# Patient Record
Sex: Male | Born: 1975 | Race: Black or African American | Hispanic: No | Smoking: Current every day smoker
Health system: Southern US, Community
[De-identification: ages and names within clinical notes are randomized; demographics above are authoritative.]

## PROBLEM LIST (undated history)

## (undated) DIAGNOSIS — I10 Essential (primary) hypertension: Secondary | ICD-10-CM

## (undated) DIAGNOSIS — D571 Sickle-cell disease without crisis: Secondary | ICD-10-CM

## (undated) DIAGNOSIS — K219 Gastro-esophageal reflux disease without esophagitis: Secondary | ICD-10-CM

## (undated) DIAGNOSIS — D5720 Sickle-cell/Hb-C disease without crisis: Secondary | ICD-10-CM

## (undated) DIAGNOSIS — J45909 Unspecified asthma, uncomplicated: Secondary | ICD-10-CM

## (undated) HISTORY — PX: CHOLECYSTECTOMY: SHX55

---
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
## 1997-10-11 ENCOUNTER — Emergency Department (HOSPITAL_COMMUNITY): Admission: EM | Admit: 1997-10-11 | Discharge: 1997-10-11 | Payer: Self-pay | Admitting: Emergency Medicine

## 1997-11-12 ENCOUNTER — Emergency Department (HOSPITAL_COMMUNITY): Admission: EM | Admit: 1997-11-12 | Discharge: 1997-11-12 | Payer: Self-pay | Admitting: Emergency Medicine

## 1997-12-31 ENCOUNTER — Emergency Department (HOSPITAL_COMMUNITY): Admission: EM | Admit: 1997-12-31 | Discharge: 1997-12-31 | Payer: Self-pay | Admitting: Emergency Medicine

## 1998-05-30 ENCOUNTER — Encounter: Payer: Self-pay | Admitting: Family Medicine

## 1998-05-30 ENCOUNTER — Inpatient Hospital Stay (HOSPITAL_COMMUNITY): Admission: AD | Admit: 1998-05-30 | Discharge: 1998-06-03 | Payer: Self-pay | Admitting: Family Medicine

## 1999-07-31 ENCOUNTER — Encounter: Payer: Self-pay | Admitting: Emergency Medicine

## 1999-07-31 ENCOUNTER — Emergency Department (HOSPITAL_COMMUNITY): Admission: EM | Admit: 1999-07-31 | Discharge: 1999-07-31 | Payer: Self-pay | Admitting: Emergency Medicine

## 1999-08-04 ENCOUNTER — Encounter (HOSPITAL_BASED_OUTPATIENT_CLINIC_OR_DEPARTMENT_OTHER): Payer: Self-pay | Admitting: General Surgery

## 1999-08-04 ENCOUNTER — Ambulatory Visit (HOSPITAL_COMMUNITY): Admission: RE | Admit: 1999-08-04 | Discharge: 1999-08-04 | Payer: Self-pay | Admitting: General Surgery

## 1999-08-09 ENCOUNTER — Ambulatory Visit (HOSPITAL_COMMUNITY): Admission: RE | Admit: 1999-08-09 | Discharge: 1999-08-10 | Payer: Self-pay | Admitting: General Surgery

## 1999-08-09 ENCOUNTER — Encounter (INDEPENDENT_AMBULATORY_CARE_PROVIDER_SITE_OTHER): Payer: Self-pay | Admitting: Specialist

## 1999-08-09 ENCOUNTER — Encounter (HOSPITAL_BASED_OUTPATIENT_CLINIC_OR_DEPARTMENT_OTHER): Payer: Self-pay | Admitting: General Surgery

## 1999-08-12 ENCOUNTER — Inpatient Hospital Stay (HOSPITAL_COMMUNITY): Admission: AD | Admit: 1999-08-12 | Discharge: 1999-08-15 | Payer: Self-pay | Admitting: Family Medicine

## 1999-08-12 ENCOUNTER — Encounter: Payer: Self-pay | Admitting: Family Medicine

## 1999-08-15 ENCOUNTER — Encounter: Payer: Self-pay | Admitting: Family Medicine

## 1999-10-21 ENCOUNTER — Emergency Department (HOSPITAL_COMMUNITY): Admission: EM | Admit: 1999-10-21 | Discharge: 1999-10-21 | Payer: Self-pay | Admitting: Internal Medicine

## 1999-10-21 ENCOUNTER — Encounter: Payer: Self-pay | Admitting: Internal Medicine

## 2005-12-08 ENCOUNTER — Inpatient Hospital Stay (HOSPITAL_COMMUNITY): Admission: AC | Admit: 2005-12-08 | Discharge: 2005-12-09 | Payer: Self-pay

## 2006-01-01 ENCOUNTER — Emergency Department (HOSPITAL_COMMUNITY): Admission: EM | Admit: 2006-01-01 | Discharge: 2006-01-01 | Payer: Self-pay | Admitting: Emergency Medicine

## 2006-01-05 ENCOUNTER — Emergency Department (HOSPITAL_COMMUNITY): Admission: EM | Admit: 2006-01-05 | Discharge: 2006-01-06 | Payer: Self-pay | Admitting: Emergency Medicine

## 2006-02-13 DIAGNOSIS — I10 Essential (primary) hypertension: Secondary | ICD-10-CM

## 2006-02-13 HISTORY — DX: Essential (primary) hypertension: I10

## 2006-03-16 ENCOUNTER — Emergency Department (HOSPITAL_COMMUNITY): Admission: EM | Admit: 2006-03-16 | Discharge: 2006-03-16 | Payer: Self-pay | Admitting: Emergency Medicine

## 2006-08-12 ENCOUNTER — Emergency Department (HOSPITAL_COMMUNITY): Admission: EM | Admit: 2006-08-12 | Discharge: 2006-08-13 | Payer: Self-pay | Admitting: Emergency Medicine

## 2006-08-31 ENCOUNTER — Emergency Department (HOSPITAL_COMMUNITY): Admission: EM | Admit: 2006-08-31 | Discharge: 2006-08-31 | Payer: Self-pay | Admitting: Emergency Medicine

## 2007-04-16 ENCOUNTER — Emergency Department (HOSPITAL_COMMUNITY): Admission: EM | Admit: 2007-04-16 | Discharge: 2007-04-17 | Payer: Self-pay | Admitting: Emergency Medicine

## 2007-10-25 ENCOUNTER — Emergency Department (HOSPITAL_COMMUNITY): Admission: EM | Admit: 2007-10-25 | Discharge: 2007-10-25 | Payer: Self-pay | Admitting: Emergency Medicine

## 2008-11-05 ENCOUNTER — Emergency Department (HOSPITAL_COMMUNITY): Admission: EM | Admit: 2008-11-05 | Discharge: 2008-11-05 | Payer: Self-pay | Admitting: Emergency Medicine

## 2008-11-06 ENCOUNTER — Inpatient Hospital Stay (HOSPITAL_COMMUNITY): Admission: EM | Admit: 2008-11-06 | Discharge: 2008-11-09 | Payer: Self-pay | Admitting: Emergency Medicine

## 2009-08-16 ENCOUNTER — Emergency Department (HOSPITAL_COMMUNITY): Admission: EM | Admit: 2009-08-16 | Discharge: 2009-08-16 | Payer: Self-pay | Admitting: Emergency Medicine

## 2009-09-19 ENCOUNTER — Emergency Department (HOSPITAL_COMMUNITY): Admission: EM | Admit: 2009-09-19 | Discharge: 2009-09-19 | Payer: Self-pay | Admitting: Family Medicine

## 2009-11-18 ENCOUNTER — Emergency Department (HOSPITAL_COMMUNITY): Admission: EM | Admit: 2009-11-18 | Discharge: 2009-11-18 | Payer: Self-pay | Admitting: Emergency Medicine

## 2009-11-24 ENCOUNTER — Emergency Department (HOSPITAL_COMMUNITY): Admission: EM | Admit: 2009-11-24 | Discharge: 2009-11-24 | Payer: Self-pay | Admitting: Emergency Medicine

## 2009-11-29 ENCOUNTER — Emergency Department (HOSPITAL_COMMUNITY): Admission: EM | Admit: 2009-11-29 | Discharge: 2009-11-29 | Payer: Self-pay | Admitting: Emergency Medicine

## 2010-04-19 ENCOUNTER — Emergency Department (HOSPITAL_COMMUNITY)
Admission: EM | Admit: 2010-04-19 | Discharge: 2010-04-19 | Disposition: A | Discharge status: Home / Self Care | Payer: Medicaid Other | Attending: Emergency Medicine | Admitting: Emergency Medicine

## 2010-04-19 DIAGNOSIS — R51 Headache: Secondary | ICD-10-CM | POA: Insufficient documentation

## 2010-04-19 DIAGNOSIS — Z79899 Other long term (current) drug therapy: Secondary | ICD-10-CM | POA: Insufficient documentation

## 2010-04-19 DIAGNOSIS — J329 Chronic sinusitis, unspecified: Secondary | ICD-10-CM | POA: Insufficient documentation

## 2010-04-19 DIAGNOSIS — I10 Essential (primary) hypertension: Secondary | ICD-10-CM | POA: Insufficient documentation

## 2010-04-19 DIAGNOSIS — J45909 Unspecified asthma, uncomplicated: Secondary | ICD-10-CM | POA: Insufficient documentation

## 2010-04-27 LAB — URINALYSIS, ROUTINE W REFLEX MICROSCOPIC
Bilirubin Urine: NEGATIVE
Glucose, UA: NEGATIVE mg/dL
Hgb urine dipstick: NEGATIVE
Ketones, ur: NEGATIVE mg/dL
Nitrite: NEGATIVE
Protein, ur: NEGATIVE mg/dL
Specific Gravity, Urine: 1.018 (ref 1.005–1.030)
Urobilinogen, UA: 1 mg/dL (ref 0.0–1.0)
pH: 5.5 (ref 5.0–8.0)

## 2010-04-27 LAB — GC/CHLAMYDIA PROBE AMP, GENITAL
Chlamydia, DNA Probe: NEGATIVE
GC Probe Amp, Genital: NEGATIVE

## 2010-04-29 LAB — GC/CHLAMYDIA PROBE AMP, GENITAL
Chlamydia, DNA Probe: NEGATIVE
GC Probe Amp, Genital: NEGATIVE

## 2010-05-01 LAB — GC/CHLAMYDIA PROBE AMP, GENITAL: GC Probe Amp, Genital: NEGATIVE

## 2010-05-20 LAB — DIFFERENTIAL
Basophils Absolute: 0 10*3/uL (ref 0.0–0.1)
Basophils Relative: 0 % (ref 0–1)
Eosinophils Relative: 0 % (ref 0–5)
Eosinophils Relative: 1 % (ref 0–5)
Lymphocytes Relative: 4 % — ABNORMAL LOW (ref 12–46)
Lymphocytes Relative: 6 % — ABNORMAL LOW (ref 12–46)
Lymphocytes Relative: 11 % — ABNORMAL LOW (ref 12–46)
Lymphs Abs: 1.8 10*3/uL (ref 0.7–4.0)
Lymphs Abs: 1.8 10*3/uL (ref 0.7–4.0)
Monocytes Absolute: 1.4 10*3/uL — ABNORMAL HIGH (ref 0.1–1.0)
Monocytes Relative: 12 % (ref 3–12)
Monocytes Relative: 5 % (ref 3–12)
Neutrophils Relative %: 82 % — ABNORMAL HIGH (ref 43–77)
Neutrophils Relative %: 91 % — ABNORMAL HIGH (ref 43–77)

## 2010-05-20 LAB — CBC
HCT: 31.8 % — ABNORMAL LOW (ref 39.0–52.0)
HCT: 32.8 % — ABNORMAL LOW (ref 39.0–52.0)
HCT: 36.2 % — ABNORMAL LOW (ref 39.0–52.0)
HCT: 37.7 % — ABNORMAL LOW (ref 39.0–52.0)
HCT: 40.8 % (ref 39.0–52.0)
HCT: 43.4 % (ref 39.0–52.0)
Hemoglobin: 10.7 g/dL — ABNORMAL LOW (ref 13.0–17.0)
Hemoglobin: 12.4 g/dL — ABNORMAL LOW (ref 13.0–17.0)
Hemoglobin: 13.9 g/dL (ref 13.0–17.0)
Hemoglobin: 14.4 g/dL (ref 13.0–17.0)
MCHC: 33.7 g/dL (ref 30.0–36.0)
MCHC: 34.3 g/dL (ref 30.0–36.0)
MCV: 86.5 fL (ref 78.0–100.0)
Platelets: 56 10*3/uL — ABNORMAL LOW (ref 150–400)
Platelets: 64 10*3/uL — ABNORMAL LOW (ref 150–400)
Platelets: 68 10*3/uL — ABNORMAL LOW (ref 150–400)
Platelets: 70 10*3/uL — ABNORMAL LOW (ref 150–400)
Platelets: 132 10*3/uL — ABNORMAL LOW (ref 150–400)
RBC: 3.83 MIL/uL — ABNORMAL LOW (ref 4.22–5.81)
RDW: 18.8 % — ABNORMAL HIGH (ref 11.5–15.5)
RDW: 19.1 % — ABNORMAL HIGH (ref 11.5–15.5)
RDW: 19.2 % — ABNORMAL HIGH (ref 11.5–15.5)
WBC: 22.6 10*3/uL — ABNORMAL HIGH (ref 4.0–10.5)
WBC: 28.9 10*3/uL — ABNORMAL HIGH (ref 4.0–10.5)
WBC: 16.9 10*3/uL — ABNORMAL HIGH (ref 4.0–10.5)

## 2010-05-20 LAB — BASIC METABOLIC PANEL
BUN: 10 mg/dL (ref 6–23)
BUN: 9 mg/dL (ref 6–23)
Chloride: 108 mEq/L (ref 96–112)
Creatinine, Ser: 1.41 mg/dL (ref 0.4–1.5)
GFR calc non Af Amer: 58 mL/min — ABNORMAL LOW (ref >60)
GFR calc non Af Amer: 41 mL/min — ABNORMAL LOW (ref >60)
Glucose, Bld: 117 mg/dL — ABNORMAL HIGH (ref 70–99)
Potassium: 4 mEq/L (ref 3.5–5.1)
Potassium: 4.9 mEq/L (ref 3.5–5.1)
Sodium: 136 mEq/L (ref 135–145)

## 2010-05-20 LAB — LIPID PANEL
Cholesterol: 84 mg/dL (ref 0–200)
LDL Cholesterol: 44 mg/dL (ref 0–99)
Total CHOL/HDL Ratio: 2.7 RATIO
Triglycerides: 44 mg/dL (ref <150)
VLDL: 9 mg/dL (ref 0–40)

## 2010-05-20 LAB — COMPREHENSIVE METABOLIC PANEL
ALT: 370 U/L — ABNORMAL HIGH (ref 0–53)
AST: 169 U/L — ABNORMAL HIGH (ref 0–37)
Albumin: 3.3 g/dL — ABNORMAL LOW (ref 3.5–5.2)
Albumin: 4.9 g/dL (ref 3.5–5.2)
BUN: 11 mg/dL (ref 6–23)
BUN: 8 mg/dL (ref 6–23)
CO2: 26 mEq/L (ref 19–32)
CO2: 27 mEq/L (ref 19–32)
Calcium: 8.9 mg/dL (ref 8.4–10.5)
Calcium: 9 mg/dL (ref 8.4–10.5)
Calcium: 9.3 mg/dL (ref 8.4–10.5)
Calcium: 9.6 mg/dL (ref 8.4–10.5)
Chloride: 95 mEq/L — ABNORMAL LOW (ref 96–112)
Creatinine, Ser: 1.22 mg/dL (ref 0.4–1.5)
Creatinine, Ser: 1.39 mg/dL (ref 0.4–1.5)
Creatinine, Ser: 1.39 mg/dL (ref 0.4–1.5)
Creatinine, Ser: 1.62 mg/dL — ABNORMAL HIGH (ref 0.4–1.5)
GFR calc Af Amer: >60 mL/min (ref >60)
GFR calc non Af Amer: 59 mL/min — ABNORMAL LOW (ref >60)
GFR calc non Af Amer: >60 mL/min (ref >60)
Glucose, Bld: 123 mg/dL — ABNORMAL HIGH (ref 70–99)
Glucose, Bld: 135 mg/dL — ABNORMAL HIGH (ref 70–99)
Glucose, Bld: 89 mg/dL (ref 70–99)
Potassium: 4.2 mEq/L (ref 3.5–5.1)
Total Bilirubin: 3.9 mg/dL — ABNORMAL HIGH (ref 0.3–1.2)
Total Protein: 8.1 g/dL (ref 6.0–8.3)

## 2010-05-20 LAB — URINALYSIS, ROUTINE W REFLEX MICROSCOPIC
Bilirubin Urine: NEGATIVE
Glucose, UA: NEGATIVE mg/dL
Ketones, ur: NEGATIVE mg/dL
Nitrite: NEGATIVE
Protein, ur: 30 mg/dL — AB
Protein, ur: 100 mg/dL — AB
Urobilinogen, UA: 1 mg/dL (ref 0.0–1.0)
Urobilinogen, UA: 1 mg/dL (ref 0.0–1.0)

## 2010-05-20 LAB — CULTURE, BLOOD (ROUTINE X 2): Culture: NO GROWTH

## 2010-05-20 LAB — RAPID URINE DRUG SCREEN, HOSP PERFORMED
Amphetamines: NOT DETECTED
Barbiturates: NOT DETECTED
Benzodiazepines: NOT DETECTED
Benzodiazepines: NOT DETECTED
Cocaine: NOT DETECTED

## 2010-05-20 LAB — RETICULOCYTES
RBC.: 4.76 MIL/uL (ref 4.22–5.81)
Retic Count, Absolute: 176.1 10*3/uL (ref 19.0–186.0)
Retic Count, Absolute: 207.9 10*3/uL — ABNORMAL HIGH (ref 19.0–186.0)
Retic Ct Pct: 3.7 % — ABNORMAL HIGH (ref 0.4–3.1)
Retic Ct Pct: 4.2 % — ABNORMAL HIGH (ref 0.4–3.1)

## 2010-05-20 LAB — ETHANOL: Alcohol, Ethyl (B): <5 mg/dL (ref 0–10)

## 2010-05-20 LAB — URINE MICROSCOPIC-ADD ON

## 2010-05-20 LAB — CK TOTAL AND CKMB (NOT AT ARMC): Relative Index: 0 (ref 0.0–2.5)

## 2010-05-20 LAB — HEPATITIS PANEL, ACUTE: Hep B C IgM: NEGATIVE

## 2010-05-20 LAB — URINE CULTURE: Culture: NO GROWTH

## 2010-07-01 NOTE — Op Note (Signed)
[place]. [facility]  Patient:    CASSADY, TURANO                     MRN: [id number] Proc. Date: 08/09/99 Adm. Date:  [id number] Disc. Date: [id number] Attending:  Sonda Primes CC:         Mardene Celeste. Lurene Shadow, M.D. x 2                           Operative Report  PREOPERATIVE DIAGNOSIS:  Chronic calculus cholecystitis.  POSTOPERATIVE DIAGNOSIS:  Chronic calculus cholecystitis.  PROCEDURE:  Laparoscopic cholecystectomy and intraoperative cholangiogram.  SURGEON:  Luisa Hart L. Lurene Shadow, M.D.  ASSISTANT:  Marnee Spring. Wiliam Ke, M.D.  ANESTHESIA:  General.  INDICATIONS:  The patient is a 35 year old man with sickle cell anemia, presenting with upper abdominal pain, nausea and vomiting.  CT scan and gallbladder ultrasound confirmed cholelithiasis.  The patient also has a very large spleen and liver.  He is brought to the operating room now for a laparoscopic cholecystectomy.  Usual precautions for sickle cell patients were taken.  DESCRIPTION OF PROCEDURE:  Following the induction of anesthesia, the patient was positioned supinely, the abdomen prepped and draped to be included in the sterile operative field.  Open laparoscopy of the umbilicus created and Hassan cannula inserted and insufflation of the peritoneal cavity to 40 mmHg pressure with carbon dioxide was carried out.  Following exploration of the abdomen, the liver looks to be large, spleen enlarged and also somewhat distended, but decompressed with the nasogastric tube, and the large and small intestine appeared to be normal.  Under direct vision, epigastric and lateral ports were placed.  The gallbladder was grasped and retracted cephalad.  Dissection was carried down to the region of the hepatoduodenal ligament and was isolated from the cystic artery and the cystic duct.  The cystic artery ran anterior to the cystic duct.  The cystic artery was doubly clipped and transected.  The cystic duct was  dissected free and opened.  There was a rather large cystic duct and upon opening, multiple small stones could be extravasated.  I attempted a cystic duct cholangiogram at this point.  However, it was very difficult to get the cholangiocatheter to stay within the duct and because of the very large valve within the cystic duct.  However, a cholangiogram was obtained using a Reddick catheter placed within the duct and resulting cholangiogram showed free flow of contrast into the common bile duct and down to the duodenum.  We could not see the common hepatic duct or upper hepatic radicles because of extravasation of the contrast.  Several more attempts were made to get a good cholangiogram, but this was not possible.  We then dissected down on the cystic duct so as to positively identify the common hepatic duct and it was noted to be uninjured.  The cystic duct was then doubly clipped and transected, and the gallbladder dissected free from the liver bed using electrocautery.  At the end of the dissection, the liver bed was checked for hemostasis and noted to be dry.  Sponge, instrument, and sharp counts were then verified.  The camera removed through the epigastric port and the gallbladder retrieved through the umbilical port without difficulty.  The specimen was also thoroughly inspected and no additional _______ tissue was noted.  Pneumoperitoneum was allowed to deflate.  Sponge, instrument, and sharp counts again verified, and the wound was closed in  layers as follows; the umbilical wound in two layers with 0 Dexon and 4-0 Dexon, the epigastric wound with 4-0 Dexon and lateral flank wounds with 4-0 Dexon.  All wounds reinforced with Steri-Strips.  A sterile dressing was applied. Anesthetic reversed and the patient removed from the operating room to the recovery room in stable condition, having tolerated the procedure well. DD:  08/09/99 TD:  08/10/99 Job: [id number]

## 2010-07-01 NOTE — Discharge Summary (Signed)
[place]. [facility]  Patient:    Dean Webb, Dean Webb                     MRN: [id number] Adm. Date:  [id number] Disc. Date: [id number] Attending:  Judie Petit                           Discharge Summary  NO DICTATION. DD:  09/22/99 TD:  09/22/99 Job: [id number]

## 2010-07-01 NOTE — Discharge Summary (Signed)
[place]. [facility]  Patient:    Dean Webb, Dean Webb                     MRN: [id number] Adm. Date:  [id number] Disc. Date: [id number] Attending:  Judie Petit                           Discharge Summary  NO DICTATION DD:  09/22/99 TD:  09/23/99 Job: [phone] [id number]

## 2010-07-01 NOTE — Discharge Summary (Signed)
[place]. [facility]  Patient:    Dean Webb, Dean Webb                     MRN: [id number] Adm. Date:  [id number] Disc. Date: [id number] Attending:  Judie Webb                           Discharge Summary  ADMITTING DIAGNOSES: 1. Acute chest syndrome. 2. ______ sickle cell disease. 3. Status post recent laparoscopic cholecystectomy.  DISCHARGE DIAGNOSES: 1. Right lower lobe pneumonia. 2. ______  sickle cell vaso-occlusive crisis. 3. Status post recent laparoscopic cholecystectomy.  DISCHARGE CONDITION:  Stable.  DISCHARGE DISPOSITION:  Follow up in approximately one week.  DISCHARGE MEDICATIONS:  Levaquin 500 mg daily for five more days.  Will repeat a chest x-ray in approximately one week.  Patient will resume home medicine of Vicuprofen one q.i.d. p.r.n. pain and folic acid 1 mg daily.  HISTORY OF PRESENT ILLNESS:  This patient is a 35 year old single black male who presented with weakness and chest pain onset on June 27,2001, after a laparoscopic cholecystectomy on August 09, 1999.  He was seen in the emergency room on July 31, 1999, and found to have gallbladder disease.  He was subsequently referred to Dr. Leonie Webb and laparoscopic cholecystectomy was done.  Patient thusly has [name] sickle cell disease and has been symptomatic at intervals requiring opioid analgesics.  He presented to my office on day of admission with complaint of chest pain substernally and coughing up yellow mucus of two nights.  He had been unable to sleep despite being on apparently Keflex 250 mg b.i.d. and opioid analgesics since surgery.  PAST MEDICAL HISTORY:  Patient has had several admissions to [facility] for mostly vaso-occlusive pain crisis.  PAST SURGICAL HISTORY:  Only operation has been laparoscopic cholecystectomy on August 04, 1999.  ALLERGIES:  He has no known allergies.  MEDICATIONS:  Vicuprofen.  FAMILY HISTORY:  Patients mother is age  24 and disabled due to heart and asthma problems.  Father is age 69 and has asthma.  He has three siblings who are alive and well.  He has two children, ages 2 and 76, who are alive and well.  REVIEW OF SYSTEMS:  Unremarkable.  PERSONAL HISTORY:  Patient does not drink alcohol, do drugs, or smoke.  He has no pica.  SOCIAL HISTORY:  Reveals patient lives at home with his mother.  He stopped school in the 10th grade.  His occupation is currently disability secondary to sickle cell disease which he obtained approximately two months ago.  PHYSICAL EXAMINATION:  VITAL SIGNS:  Blood pressure 132/78, pulse 134, respirations 20, and temperature 103.3 upon arrival in hospital.  GENERAL:  Patient appeared moderately uncomfortable, experiencing some coughing paroxysms.  He was alert and lying supine in bed  HEENT:  Revealed PERRLA, EOMS were intact.  There was no significant scleral icterus though eyes were mildly slightly brownish.  Mouth exam revealed normal mucosa with slight erythematous peritonsillar regions.  NECK:  Supple without jugular venous distention, bruits, nodes, or thyromegaly. CHEST:  Clear to auscultation.  HEART:  Regular rate without murmur.  ABDOMEN:  Soft and flat.  Bowel sounds were normal.  GENITALIA:  Exam revealed bilateral distended testicles and circumcised penis.  RECTAL:  Examination was deferred.  EXTREMITIES:  Revealed normal range of motion without clubbing, cyanosis, edema, or deformity.  SKIN:  Exam revealed no lesions.  He did have slight hyperthermia to touch.  NEUROLOGIC:  Exam revealed him to be mostly weak.  LABORATORY DATA:  Chest x-ray revealed right lower lung infiltrate and left lung atelectasis.  Blood cultures x 2 peripherally had no growth.  Urinalysis revealed amber color with moderate bilirubin at 15 mg percent ketones.  There was greater than 8 mg percent of urobilinogen, trace leukocyte esterase, and ______ test positive.   There were many bacteria and few epithelial cells.  Urine culture sample apparently was not sent per labs.  CBC revealed white count of 16,200, hemoglobin 8.5, hematocrit 23.7, platelets 185,000.  Repeat revealed white count of 14.4 with a hemoglobin of 8.2.  Differential revealed 77% neutrophils, 13% lymphocytes, 7% monocytes, 1 eosinophil.  Red blood cell morphology revealed target cells, polychromasia.  Reticulocyte count was 9.7 with absolute reticulocyte 323.  Normal range is 19 to 186.  Initial chemistries reveal sodium 133, potassium 3.1, chloride 99, glucose 121, albumin 3.4, ALT 58, ALP 97, and total bilirubin 2.8.  Sputums revealed no acid fact bacilli seen, no yeast or ______ elements seen.  Repeat CBC the day of discharge revealed white count of 10.3, hemoglobin 9.5 after two units of packed red blood cells and platelet count of 208.  Chemistries were normal except for bilirubin 3.6, alkaline phosphatase 123, SGOT 50, and SGPT of 66.  Initial chest x-ray revealed right lower lobe infiltrate.  Repeat chest x-ray revealed clearing of right lower lobe infiltrate.  Patients blood type was O positive with negative antibody screen.  HOSPITAL COURSE:  The patient was admitted to the hospital telemetry unit on 3700 and given IV fluids of D-5 one-half normal saline at 150 cc per hour.  He was given Levaquin IV 5 mg q.24h. and albuterol 2.5 nebulizer q.4h.  He was also provided incentive spirometer to use q.1h. while awake.  Cultures were done and patient was given ibuprofen 800 mg q.8h.  He was given Dilaudid 2 mg IV q.2h. and OxyContin 20 mg q.12h.  He was given Prevacid 30 mg daily and gradually improved.  On-call physician provided patient with two units of packed red blood cells and potassium supplement on June 29.  He is at this time asymptomatic and not requiring other pain medicines.  He feels good and looks great.  He is ambulatory and has a terrific appetite.  He is  thus discharged for outpatient management. DD:  08/15/99 TD:  08/15/99 Job: [id number]

## 2010-08-04 ENCOUNTER — Emergency Department (HOSPITAL_COMMUNITY)
Admission: EM | Admit: 2010-08-04 | Discharge: 2010-08-04 | Disposition: A | Discharge status: Home / Self Care | Payer: Medicaid Other | Attending: Emergency Medicine | Admitting: Emergency Medicine

## 2010-08-04 ENCOUNTER — Emergency Department (HOSPITAL_COMMUNITY): Payer: Medicaid Other

## 2010-08-04 DIAGNOSIS — R109 Unspecified abdominal pain: Secondary | ICD-10-CM | POA: Insufficient documentation

## 2010-08-04 DIAGNOSIS — R000 Tachycardia, unspecified: Secondary | ICD-10-CM | POA: Insufficient documentation

## 2010-08-04 DIAGNOSIS — D571 Sickle-cell disease without crisis: Secondary | ICD-10-CM | POA: Insufficient documentation

## 2010-08-04 DIAGNOSIS — M545 Low back pain, unspecified: Secondary | ICD-10-CM | POA: Insufficient documentation

## 2010-08-04 DIAGNOSIS — R509 Fever, unspecified: Secondary | ICD-10-CM | POA: Insufficient documentation

## 2010-08-04 DIAGNOSIS — R0609 Other forms of dyspnea: Secondary | ICD-10-CM | POA: Insufficient documentation

## 2010-08-04 DIAGNOSIS — J069 Acute upper respiratory infection, unspecified: Secondary | ICD-10-CM | POA: Insufficient documentation

## 2010-08-04 DIAGNOSIS — M533 Sacrococcygeal disorders, not elsewhere classified: Secondary | ICD-10-CM | POA: Insufficient documentation

## 2010-08-04 DIAGNOSIS — IMO0001 Reserved for inherently not codable concepts without codable children: Secondary | ICD-10-CM | POA: Insufficient documentation

## 2010-08-04 DIAGNOSIS — R300 Dysuria: Secondary | ICD-10-CM | POA: Insufficient documentation

## 2010-08-04 DIAGNOSIS — R0989 Other specified symptoms and signs involving the circulatory and respiratory systems: Secondary | ICD-10-CM | POA: Insufficient documentation

## 2010-08-04 DIAGNOSIS — I10 Essential (primary) hypertension: Secondary | ICD-10-CM | POA: Insufficient documentation

## 2010-08-04 DIAGNOSIS — R059 Cough, unspecified: Secondary | ICD-10-CM | POA: Insufficient documentation

## 2010-08-04 DIAGNOSIS — R6889 Other general symptoms and signs: Secondary | ICD-10-CM | POA: Insufficient documentation

## 2010-08-04 DIAGNOSIS — R112 Nausea with vomiting, unspecified: Secondary | ICD-10-CM | POA: Insufficient documentation

## 2010-08-04 DIAGNOSIS — R05 Cough: Secondary | ICD-10-CM | POA: Insufficient documentation

## 2010-08-04 DIAGNOSIS — M79609 Pain in unspecified limb: Secondary | ICD-10-CM | POA: Insufficient documentation

## 2010-08-04 DIAGNOSIS — J45909 Unspecified asthma, uncomplicated: Secondary | ICD-10-CM | POA: Insufficient documentation

## 2010-08-04 DIAGNOSIS — R599 Enlarged lymph nodes, unspecified: Secondary | ICD-10-CM | POA: Insufficient documentation

## 2010-08-04 DIAGNOSIS — R070 Pain in throat: Secondary | ICD-10-CM | POA: Insufficient documentation

## 2010-08-04 DIAGNOSIS — R5381 Other malaise: Secondary | ICD-10-CM | POA: Insufficient documentation

## 2010-08-04 LAB — COMPREHENSIVE METABOLIC PANEL
ALT: 86 U/L — ABNORMAL HIGH (ref 0–53)
AST: 55 U/L — ABNORMAL HIGH (ref 0–37)
Albumin: 4.6 g/dL (ref 3.5–5.2)
CO2: 26 mEq/L (ref 19–32)
Calcium: 10.5 mg/dL (ref 8.4–10.5)
Sodium: 134 mEq/L — ABNORMAL LOW (ref 135–145)
Total Protein: 7.7 g/dL (ref 6.0–8.3)

## 2010-08-04 LAB — RETICULOCYTES
RBC.: 4.62 MIL/uL (ref 4.22–5.81)
Retic Count, Absolute: 175.6 10*3/uL (ref 19.0–186.0)
Retic Ct Pct: 3.8 % — ABNORMAL HIGH (ref 0.4–3.1)

## 2010-08-04 LAB — CBC
MCV: 75.5 fL — ABNORMAL LOW (ref 78.0–100.0)
Platelets: 173 10*3/uL (ref 150–400)
RBC: 4.62 MIL/uL (ref 4.22–5.81)
WBC: 9.3 10*3/uL (ref 4.0–10.5)

## 2010-08-04 LAB — DIFFERENTIAL
Basophils Relative: 1 % (ref 0–1)
Eosinophils Absolute: 0 10*3/uL (ref 0.0–0.7)
Lymphocytes Relative: 16 % (ref 12–46)
Neutrophils Relative %: 61 % (ref 43–77)

## 2010-08-04 LAB — RAPID URINE DRUG SCREEN, HOSP PERFORMED
Opiates: NOT DETECTED
Tetrahydrocannabinol: NOT DETECTED

## 2010-08-04 LAB — URINALYSIS, ROUTINE W REFLEX MICROSCOPIC
Leukocytes, UA: NEGATIVE
Protein, ur: NEGATIVE mg/dL
Urobilinogen, UA: 2 mg/dL — ABNORMAL HIGH (ref 0.0–1.0)

## 2010-11-07 LAB — CBC
HCT: 37.6 — ABNORMAL LOW
Hemoglobin: 12.6 — ABNORMAL LOW
MCHC: 33.5
MCV: 84.4
RBC: 4.45
WBC: 6.7

## 2010-11-07 LAB — I-STAT 8, (EC8 V) (CONVERTED LAB)
Acid-Base Excess: 3 — ABNORMAL HIGH
Chloride: 104
Glucose, Bld: 94
Hemoglobin: 13.9
Potassium: 3.6
Sodium: 139
TCO2: 29
pH, Ven: 7.407 — ABNORMAL HIGH

## 2010-11-07 LAB — DIFFERENTIAL
Basophils Relative: 1
Eosinophils Absolute: 0.2
Lymphs Abs: 2
Monocytes Absolute: 0.6
Monocytes Relative: 9

## 2010-11-07 LAB — POCT I-STAT CREATININE: Operator id: 277751

## 2010-11-16 LAB — DIFFERENTIAL
Basophils Absolute: 0
Eosinophils Relative: 4
Lymphocytes Relative: 37
Lymphs Abs: 2.2
Monocytes Absolute: 0.5
Monocytes Relative: 9
Neutro Abs: 2.9

## 2010-11-16 LAB — COMPREHENSIVE METABOLIC PANEL
AST: 70 — ABNORMAL HIGH
Albumin: 4.3
BUN: 8
Calcium: 9.8
Chloride: 104
Creatinine, Ser: 1.21
GFR calc Af Amer: >60
Total Protein: 7.1

## 2010-11-16 LAB — AMYLASE: Amylase: 108

## 2010-11-16 LAB — CBC
HCT: 41.6
MCV: 86.3
Platelets: 141 — ABNORMAL LOW
RDW: 18.4 — ABNORMAL HIGH
WBC: 5.9

## 2010-11-16 LAB — URINALYSIS, ROUTINE W REFLEX MICROSCOPIC
Bilirubin Urine: NEGATIVE
Glucose, UA: NEGATIVE
Hgb urine dipstick: NEGATIVE
Specific Gravity, Urine: 1.015
Urobilinogen, UA: 1
pH: 5.5

## 2010-11-28 LAB — I-STAT 8, (EC8 V) (CONVERTED LAB)
Acid-Base Excess: 2
Bicarbonate: 26.8 — ABNORMAL HIGH
Glucose, Bld: 94
Sodium: 140
TCO2: 28
pH, Ven: 7.431 — ABNORMAL HIGH

## 2010-11-28 LAB — DIFFERENTIAL
Eosinophils Absolute: 0.2
Lymphs Abs: 2.1
Monocytes Absolute: 0.6
Monocytes Relative: 10
Neutrophils Relative %: 48

## 2010-11-28 LAB — POCT I-STAT CREATININE
Creatinine, Ser: 1.2
Operator id: 189501

## 2010-11-28 LAB — CBC
HCT: 44.2
Hemoglobin: 14.8
MCHC: 33.4
MCV: 84.9
Platelets: 175
RBC: 5.2
RDW: 18 — ABNORMAL HIGH
WBC: 5.6

## 2011-06-04 ENCOUNTER — Emergency Department (HOSPITAL_COMMUNITY)
Admission: EM | Admit: 2011-06-04 | Discharge: 2011-06-04 | Disposition: A | Discharge status: Home / Self Care | Payer: Medicaid Other | Attending: Emergency Medicine | Admitting: Emergency Medicine

## 2011-06-04 ENCOUNTER — Encounter (HOSPITAL_COMMUNITY): Payer: Self-pay | Admitting: Emergency Medicine

## 2011-06-04 DIAGNOSIS — R112 Nausea with vomiting, unspecified: Secondary | ICD-10-CM | POA: Insufficient documentation

## 2011-06-04 DIAGNOSIS — R1012 Left upper quadrant pain: Secondary | ICD-10-CM | POA: Insufficient documentation

## 2011-06-04 DIAGNOSIS — K5289 Other specified noninfective gastroenteritis and colitis: Secondary | ICD-10-CM | POA: Insufficient documentation

## 2011-06-04 DIAGNOSIS — R197 Diarrhea, unspecified: Secondary | ICD-10-CM | POA: Insufficient documentation

## 2011-06-04 DIAGNOSIS — IMO0001 Reserved for inherently not codable concepts without codable children: Secondary | ICD-10-CM | POA: Insufficient documentation

## 2011-06-04 DIAGNOSIS — R059 Cough, unspecified: Secondary | ICD-10-CM | POA: Insufficient documentation

## 2011-06-04 DIAGNOSIS — R05 Cough: Secondary | ICD-10-CM | POA: Insufficient documentation

## 2011-06-04 DIAGNOSIS — D571 Sickle-cell disease without crisis: Secondary | ICD-10-CM | POA: Insufficient documentation

## 2011-06-04 DIAGNOSIS — I10 Essential (primary) hypertension: Secondary | ICD-10-CM | POA: Insufficient documentation

## 2011-06-04 DIAGNOSIS — K529 Noninfective gastroenteritis and colitis, unspecified: Secondary | ICD-10-CM

## 2011-06-04 HISTORY — DX: Essential (primary) hypertension: I10

## 2011-06-04 HISTORY — DX: Sickle-cell disease without crisis: D57.1

## 2011-06-04 LAB — COMPREHENSIVE METABOLIC PANEL
ALT: 78 U/L — ABNORMAL HIGH (ref 0–53)
BUN: 20 mg/dL (ref 6–23)
CO2: 25 mEq/L (ref 19–32)
Calcium: 9.9 mg/dL (ref 8.4–10.5)
Creatinine, Ser: 1.43 mg/dL — ABNORMAL HIGH (ref 0.50–1.35)
GFR calc Af Amer: 72 mL/min — ABNORMAL LOW (ref >90)
GFR calc non Af Amer: 62 mL/min — ABNORMAL LOW (ref >90)
Glucose, Bld: 113 mg/dL — ABNORMAL HIGH (ref 70–99)

## 2011-06-04 LAB — CBC
HCT: 37.4 % — ABNORMAL LOW (ref 39.0–52.0)
Hemoglobin: 14.1 g/dL (ref 13.0–17.0)
MCH: 28.1 pg (ref 26.0–34.0)
MCHC: 37.7 g/dL — ABNORMAL HIGH (ref 30.0–36.0)

## 2011-06-04 LAB — DIFFERENTIAL
Basophils Relative: 0 % (ref 0–1)
Eosinophils Absolute: 0.1 10*3/uL (ref 0.0–0.7)
Lymphs Abs: 3.1 10*3/uL (ref 0.7–4.0)
Monocytes Absolute: 2.5 10*3/uL — ABNORMAL HIGH (ref 0.1–1.0)
Neutro Abs: 9 10*3/uL — ABNORMAL HIGH (ref 1.7–7.7)

## 2011-06-04 LAB — URINALYSIS, ROUTINE W REFLEX MICROSCOPIC
Hgb urine dipstick: NEGATIVE
Protein, ur: NEGATIVE mg/dL
Urobilinogen, UA: 1 mg/dL (ref 0.0–1.0)

## 2011-06-04 MED ORDER — ONDANSETRON 8 MG PO TBDP: 8.0000 mg | ORAL_TABLET | Freq: Three times a day (TID) | ORAL | Status: AC | PRN

## 2011-06-04 MED ORDER — FENTANYL CITRATE 0.05 MG/ML IJ SOLN
50.0000 ug | Freq: Once | INTRAMUSCULAR | Status: AC
  Administered 2011-06-04: 50 ug via INTRAVENOUS
  Filled 2011-06-04: qty 2

## 2011-06-04 MED ORDER — TRAMADOL HCL 50 MG PO TABS: 50.0000 mg | ORAL_TABLET | Freq: Four times a day (QID) | ORAL | Status: AC | PRN

## 2011-06-04 MED ORDER — ONDANSETRON HCL 4 MG/2ML IJ SOLN
4.0000 mg | Freq: Once | INTRAMUSCULAR | Status: AC
  Administered 2011-06-04: 4 mg via INTRAVENOUS
  Filled 2011-06-04: qty 2

## 2011-06-04 MED ORDER — SODIUM CHLORIDE 0.9 % IV BOLUS (SEPSIS)
1000.0000 mL | Freq: Once | INTRAVENOUS | Status: AC
  Administered 2011-06-04: 1000 mL via INTRAVENOUS

## 2011-06-04 NOTE — ED Provider Notes (Signed)
History     CSN: [id number]  Arrival date & time 06/04/11  0612   First MD Initiated Contact with Patient 06/04/11 0622      6:31 AM Patient reports emesis that began 3 days ago. States initially also had associated diarrhea which has since resolved. However states she continues to have nausea and vomiting, myalgias and coughing. Reports diffuse abdominal pain which seems to be concentrated in the left upper quadrant. Denies urinary symptoms, hematuria, hematochezia, fever, chest pain, shortness of breath patient is a significant history of sickle cell disease, and a cholecystectomy. Reports positive sick contact. States his daughter had similar symptoms that started 5 days ago.  Patient is a 36 y.o. male presenting with vomiting. The history is provided by the patient.  Emesis  This is a new problem. The current episode started more than 2 days ago. The problem has been gradually worsening. The emesis has an appearance of stomach contents. There has been no fever. Associated symptoms include abdominal pain and diarrhea. Pertinent negatives include no chills, no cough, no fever, no headaches, no myalgias and no URI. Risk factors include ill contacts.    Past Medical History  Diagnosis Date  . Sickle cell disease   . Hypertension     Past Surgical History  Procedure Date  . Cholecystectomy     History reviewed. No pertinent family history.  History  Substance Use Topics  . Smoking status: Current Some Day Smoker  . Smokeless tobacco: Not on file  . Alcohol Use: No      Review of Systems  Constitutional: Negative for fever and chills.  Respiratory: Negative for cough and shortness of breath.   Cardiovascular: Negative for chest pain.  Gastrointestinal: Positive for nausea, vomiting, abdominal pain and diarrhea. Negative for constipation, blood in stool and rectal pain.  Genitourinary: Negative for dysuria, hematuria, flank pain, discharge, penile pain and testicular pain.    Musculoskeletal: Negative for myalgias and back pain.  Neurological: Negative for dizziness, weakness, numbness and headaches.  All other systems reviewed and are negative.    Allergies  Review of patient's allergies indicates no known allergies.  Home Medications   Current Outpatient Rx  Name Route Sig Dispense Refill  . ALBUTEROL SULFATE HFA 108 (90 BASE) MCG/ACT IN AERS Inhalation Inhale 2 puffs into the lungs every 6 (six) hours as needed. wheezing    . HYDROCHLOROTHIAZIDE 25 MG PO TABS Oral Take 25 mg by mouth daily.      BP 151/108  Pulse 111  Temp(Src) 97.6 F (36.4 C) (Oral)  Resp 20  SpO2 97%  Physical Exam  Constitutional: He is oriented to person, place, and time. He appears well-developed and well-nourished.  HENT:  Head: Normocephalic and atraumatic.  Eyes: Conjunctivae are normal. Pupils are equal, round, and reactive to light.  Neck: Normal range of motion. Neck supple.  Cardiovascular: Normal rate, regular rhythm and normal heart sounds.  Exam reveals no gallop and no friction rub.   No murmur heard.      Hypertensive  Pulmonary/Chest: Effort normal and breath sounds normal. He has no wheezes. He has no rales. He exhibits no tenderness.  Abdominal: Soft. Bowel sounds are normal. He exhibits no distension and no mass. There is tenderness (diffuse). There is no rebound and no guarding.  Neurological: He is alert and oriented to person, place, and time.  Skin: Skin is warm and dry. No rash noted. No erythema. No pallor.  Psychiatric: He has a normal mood and affect.  His behavior is normal.    ED Course  Procedures   Results for orders placed during the hospital encounter of 06/04/11  CBC      Component Value Range   WBC 14.7 (*) 4.0 - 10.5 (K/uL)   RBC 5.02  4.22 - 5.81 (MIL/uL)   Hemoglobin 14.1  13.0 - 17.0 (g/dL)   HCT 16.1 (*) 09.6 - 52.0 (%)   MCV 74.5 (*) 78.0 - 100.0 (fL)   MCH 28.1  26.0 - 34.0 (pg)   MCHC 37.7 (*) 30.0 - 36.0 (g/dL)   RDW  04.5 (*) 40.9 - 15.5 (%)   Platelets 234  150 - 400 (K/uL)  DIFFERENTIAL      Component Value Range   Neutrophils Relative 61  43 - 77 (%)   Lymphocytes Relative 21  12 - 46 (%)   Monocytes Relative 17 (*) 3 - 12 (%)   Eosinophils Relative 1  0 - 5 (%)   Basophils Relative 0  0 - 1 (%)   Neutro Abs 9.0 (*) 1.7 - 7.7 (K/uL)   Lymphs Abs 3.1  0.7 - 4.0 (K/uL)   Monocytes Absolute 2.5 (*) 0.1 - 1.0 (K/uL)   Eosinophils Absolute 0.1  0.0 - 0.7 (K/uL)   Basophils Absolute 0.0  0.0 - 0.1 (K/uL)   RBC Morphology POLYCHROMASIA PRESENT     WBC Morphology ATYPICAL LYMPHOCYTES    COMPREHENSIVE METABOLIC PANEL      Component Value Range   Sodium 137  135 - 145 (mEq/L)   Potassium 3.4 (*) 3.5 - 5.1 (mEq/L)   Chloride 101  96 - 112 (mEq/L)   CO2 25  19 - 32 (mEq/L)   Glucose, Bld 113 (*) 70 - 99 (mg/dL)   BUN 20  6 - 23 (mg/dL)   Creatinine, Ser 8.11 (*) 0.50 - 1.35 (mg/dL)   Calcium 9.9  8.4 - 91.4 (mg/dL)   Total Protein 7.3  6.0 - 8.3 (g/dL)   Albumin 4.2  3.5 - 5.2 (g/dL)   AST 79 (*) 0 - 37 (U/L)   ALT 78 (*) 0 - 53 (U/L)   Alkaline Phosphatase 83  39 - 117 (U/L)   Total Bilirubin 2.3 (*) 0.3 - 1.2 (mg/dL)   GFR calc non Af Amer 62 (*) >90 (mL/min)   GFR calc Af Amer 72 (*) >90 (mL/min)  LIPASE, BLOOD      Component Value Range   Lipase 14  11 - 59 (U/L)  URINALYSIS, ROUTINE W REFLEX MICROSCOPIC      Component Value Range   Color, Urine AMBER (*) YELLOW    APPearance CLEAR  CLEAR    Specific Gravity, Urine 1.019  1.005 - 1.030    pH 6.0  5.0 - 8.0    Glucose, UA NEGATIVE  NEGATIVE (mg/dL)   Hgb urine dipstick NEGATIVE  NEGATIVE    Bilirubin Urine SMALL (*) NEGATIVE    Ketones, ur 15 (*) NEGATIVE (mg/dL)   Protein, ur NEGATIVE  NEGATIVE (mg/dL)   Urobilinogen, UA 1.0  0.0 - 1.0 (mg/dL)   Nitrite NEGATIVE  NEGATIVE    Leukocytes, UA NEGATIVE  NEGATIVE    No results found.   MDM   8:57 AM Patient reports significant improvement of pain. Is sleeping heavily in room when I  entered. Upon reexamination patient is nontender abdomen. Labs benign. With positive sick contact patient likely has a gastroenteritis. Will give medication for pain and nausea. Advised patient that he did have some lab abnormalities  but they do not seem to significantly change from prior labs up to 2010. LFTs, creatinine, white blood cell count elevated but appear baseline. Patient agrees with plan for discharge. Advised followup with primary care physician if symptoms continue.       Thomasene Lot, PA-C 06/04/11 0859  Thomasene Lot, PA-C 06/04/11 [phone]

## 2011-06-04 NOTE — ED Notes (Signed)
Pt knows that urine is needed. Pt is trying to void at this time. 

## 2011-06-04 NOTE — ED Notes (Signed)
Tolerated po challenge

## 2011-06-04 NOTE — Discharge Instructions (Signed)
B.R.A.T. Diet Your doctor has recommended the B.R.A.T. diet for you or your child until the condition improves. This is often used to help control diarrhea and vomiting symptoms. If you or your child can tolerate clear liquids, you may have:  Bananas.   Rice.   Applesauce.   Toast (and other simple starches such as crackers, potatoes, noodles).  Be sure to avoid dairy products, meats, and fatty foods until symptoms are better. Fruit juices such as apple, grape, and prune juice can make diarrhea worse. Avoid these. Continue this diet for 2 days or as instructed by your caregiver. Document Released: 01/30/2005 Document Revised: 01/19/2011 Document Reviewed: 07/19/2006 Exit[employer] Patient Information 2012 [employer].Viral Gastroenteritis Viral gastroenteritis is also known as stomach flu. This condition affects the stomach and intestinal tract. It can cause sudden diarrhea and vomiting. The illness typically lasts 3 to 8 days. Most people develop an immune response that eventually gets rid of the virus. While this natural response develops, the virus can make you quite ill. CAUSES  Many different viruses can cause gastroenteritis, such as rotavirus or noroviruses. You can catch one of these viruses by consuming contaminated food or water. You may also catch a virus by sharing utensils or other personal items with an infected person or by touching a contaminated surface. SYMPTOMS  The most common symptoms are diarrhea and vomiting. These problems can cause a severe loss of body fluids (dehydration) and a body salt (electrolyte) imbalance. Other symptoms may include:  Fever.   Headache.   Fatigue.   Abdominal pain.  DIAGNOSIS  Your caregiver can usually diagnose viral gastroenteritis based on your symptoms and a physical exam. A stool sample may also be taken to test for the presence of viruses or other infections. TREATMENT  This illness typically goes away on its own. Treatments are aimed  at rehydration. The most serious cases of viral gastroenteritis involve vomiting so severely that you are not able to keep fluids down. In these cases, fluids must be given through an intravenous line (IV). HOME CARE INSTRUCTIONS   Drink enough fluids to keep your urine clear or pale yellow. Drink small amounts of fluids frequently and increase the amounts as tolerated.   Ask your caregiver for specific rehydration instructions.   Avoid:   Foods high in sugar.   Alcohol.   Carbonated drinks.   Tobacco.   Juice.   Caffeine drinks.   Extremely hot or cold fluids.   Fatty, greasy foods.   Too much intake of anything at one time.   Dairy products until 24 to 48 hours after diarrhea stops.   You may consume probiotics. Probiotics are active cultures of beneficial bacteria. They may lessen the amount and number of diarrheal stools in adults. Probiotics can be found in yogurt with active cultures and in supplements.   Wash your hands well to avoid spreading the virus.   Only take over-the-counter or prescription medicines for pain, discomfort, or fever as directed by your caregiver. Do not give aspirin to children. Antidiarrheal medicines are not recommended.   Ask your caregiver if you should continue to take your regular prescribed and over-the-counter medicines.   Keep all follow-up appointments as directed by your caregiver.  SEEK IMMEDIATE MEDICAL CARE IF:   You are unable to keep fluids down.   You do not urinate at least once every 6 to 8 hours.   You develop shortness of breath.   You notice blood in your stool or vomit. This may   look like coffee grounds.   You have abdominal pain that increases or is concentrated in one small area (localized).   You have persistent vomiting or diarrhea.   You have a fever.   The patient is a child younger than 3 months, and he or she has a fever.   The patient is a child older than 3 months, and he or she has a fever and  persistent symptoms.   The patient is a child older than 3 months, and he or she has a fever and symptoms suddenly get worse.   The patient is a baby, and he or she has no tears when crying.  MAKE SURE YOU:   Understand these instructions.   Will watch your condition.   Will get help right away if you are not doing well or get worse.  Document Released: 01/30/2005 Document Revised: 01/19/2011 Document Reviewed: 11/16/2010 Exit[employer] Patient Information 2012 [employer]. 

## 2011-06-04 NOTE — ED Notes (Signed)
Patient complaining of nausea, vomiting, chills, and body aches since Thursday.  Patient states that he did have diarrhea, but that it has stopped.  Patient states that his daughter has had the same symptoms.

## 2011-06-04 NOTE — ED Notes (Signed)
Patient given an oral trial prior to d/c iv and prior to d/c home

## 2011-06-07 NOTE — ED Provider Notes (Signed)
Medical screening examination/treatment/procedure(s) were performed by non-physician practitioner and as supervising physician I was immediately available for consultation/collaboration.  Cheri Guppy, MD 06/07/11 1102

## 2012-04-11 ENCOUNTER — Emergency Department (HOSPITAL_COMMUNITY)
Admission: EM | Admit: 2012-04-11 | Discharge: 2012-04-11 | Disposition: A | Discharge status: Home / Self Care | Payer: Medicaid Other | Attending: Emergency Medicine | Admitting: Emergency Medicine

## 2012-04-11 DIAGNOSIS — F17200 Nicotine dependence, unspecified, uncomplicated: Secondary | ICD-10-CM | POA: Insufficient documentation

## 2012-04-11 DIAGNOSIS — K1120 Sialoadenitis, unspecified: Secondary | ICD-10-CM

## 2012-04-11 DIAGNOSIS — I10 Essential (primary) hypertension: Secondary | ICD-10-CM | POA: Insufficient documentation

## 2012-04-11 DIAGNOSIS — R599 Enlarged lymph nodes, unspecified: Secondary | ICD-10-CM | POA: Insufficient documentation

## 2012-04-11 DIAGNOSIS — D571 Sickle-cell disease without crisis: Secondary | ICD-10-CM | POA: Insufficient documentation

## 2012-04-11 DIAGNOSIS — R590 Localized enlarged lymph nodes: Secondary | ICD-10-CM

## 2012-04-11 DIAGNOSIS — Z79899 Other long term (current) drug therapy: Secondary | ICD-10-CM | POA: Insufficient documentation

## 2012-04-11 LAB — CBC WITH DIFFERENTIAL/PLATELET
Basophils Absolute: 0.1 10*3/uL (ref 0.0–0.1)
Basophils Relative: 1 % (ref 0–1)
Eosinophils Absolute: 0.5 10*3/uL (ref 0.0–0.7)
Eosinophils Relative: 4 % (ref 0–5)
Lymphocytes Relative: 48 % — ABNORMAL HIGH (ref 12–46)
MCH: 28.3 pg (ref 26.0–34.0)
MCV: 74.3 fL — ABNORMAL LOW (ref 78.0–100.0)
Platelets: 280 10*3/uL (ref 150–400)
RDW: 18.7 % — ABNORMAL HIGH (ref 11.5–15.5)
WBC: 12.3 10*3/uL — ABNORMAL HIGH (ref 4.0–10.5)

## 2012-04-11 LAB — BASIC METABOLIC PANEL
BUN: 10 mg/dL (ref 6–23)
Calcium: 10.6 mg/dL — ABNORMAL HIGH (ref 8.4–10.5)
Creatinine, Ser: 1.24 mg/dL (ref 0.50–1.35)
GFR calc non Af Amer: 74 mL/min — ABNORMAL LOW (ref >90)
Glucose, Bld: 111 mg/dL — ABNORMAL HIGH (ref 70–99)

## 2012-04-11 LAB — RETICULOCYTES: Retic Ct Pct: 5.2 % — ABNORMAL HIGH (ref 0.4–3.1)

## 2012-04-11 MED ORDER — HYDROMORPHONE HCL PF 1 MG/ML IJ SOLN
1.0000 mg | Freq: Once | INTRAMUSCULAR | Status: AC
  Administered 2012-04-11: 1 mg via INTRAVENOUS
  Filled 2012-04-11: qty 1

## 2012-04-11 MED ORDER — DEXAMETHASONE SODIUM PHOSPHATE 4 MG/ML IJ SOLN
4.0000 mg | Freq: Once | INTRAMUSCULAR | Status: AC
  Administered 2012-04-11: 4 mg via INTRAVENOUS
  Filled 2012-04-11: qty 1

## 2012-04-11 MED ORDER — ONDANSETRON HCL 4 MG/2ML IJ SOLN
INTRAMUSCULAR | Status: AC
  Filled 2012-04-11: qty 2

## 2012-04-11 MED ORDER — KETOROLAC TROMETHAMINE 30 MG/ML IJ SOLN
30.0000 mg | Freq: Once | INTRAMUSCULAR | Status: AC
  Administered 2012-04-11: 30 mg via INTRAVENOUS
  Filled 2012-04-11: qty 1

## 2012-04-11 MED ORDER — SODIUM CHLORIDE 0.9 % IV BOLUS (SEPSIS)
1000.0000 mL | Freq: Once | INTRAVENOUS | Status: AC
  Administered 2012-04-11: 1000 mL via INTRAVENOUS

## 2012-04-11 MED ORDER — IBUPROFEN 800 MG PO TABS: 800.0000 mg | ORAL_TABLET | Freq: Three times a day (TID) | ORAL | Status: DC

## 2012-04-11 MED ORDER — ONDANSETRON HCL 4 MG/2ML IJ SOLN
4.0000 mg | Freq: Once | INTRAMUSCULAR | Status: AC
  Administered 2012-04-11: 4 mg via INTRAVENOUS

## 2012-04-11 NOTE — ED Provider Notes (Signed)
History     CSN: [id number]  Arrival date & time 04/11/12  2025   First MD Initiated Contact with Patient 04/11/12 2158      Chief Complaint  Patient presents with  . Oral Swelling    (Consider location/radiation/quality/duration/timing/severity/associated sxs/prior treatment) The history is provided by the patient.  Dean Webb is a 37 y.o. male history of sickle cell disease here with left neck swelling. He ate some fish fillet this morning and afterwards he felt the left side of his neck swollen. The swelling then improved. Then he ate some rotisserie chicken at night and in the swelling got worse and he has some pain when he swallows. Denies any foreign body sensation and denies eating anything with bones. Denies any fevers or chills or ear pain or fever.   Past Medical History  Diagnosis Date  . Sickle cell disease   . Hypertension     Past Surgical History  Procedure Laterality Date  . Cholecystectomy      No family history on file.  History  Substance Use Topics  . Smoking status: Current Some Day Smoker  . Smokeless tobacco: Not on file  . Alcohol Use: No      Review of Systems  Musculoskeletal:       Neck pain   All other systems reviewed and are negative.    Allergies  Review of patient's allergies indicates no known allergies.  Home Medications   Current Outpatient Rx  Name  Route  Sig  Dispense  Refill  . albuterol (PROVENTIL HFA;VENTOLIN HFA) 108 (90 BASE) MCG/ACT inhaler   Inhalation   Inhale 2 puffs into the lungs every 6 (six) hours as needed. wheezing         . hydrochlorothiazide (HYDRODIURIL) 25 MG tablet   Oral   Take 25 mg by mouth daily.         Marland Kitchen morphine (MSIR) 30 MG tablet   Oral   Take 30 mg by mouth every 4 (four) hours as needed for pain (For sickle cell associated pain).           BP 162/109  Pulse 80  Temp(Src) 99 F (37.2 C) (Oral)  Resp 20  SpO2 97%  Physical Exam  Nursing note and vitals  reviewed. Constitutional: He is oriented to person, place, and time. He appears well-developed and well-nourished.  Uncomfortable   HENT:  Head: Normocephalic.  Mouth/Throat: Oropharynx is clear and moist.  Overall good dentition, no obvious periapical abscess. No floor of mouth swelling    Eyes: Conjunctivae are normal. Pupils are equal, round, and reactive to light.  Neck: Normal range of motion.  L submandibular area with some swelling, ? Tender lymph node. No palpable stones. Trachea midline   Cardiovascular: Normal rate, regular rhythm and normal heart sounds.   Pulmonary/Chest: Effort normal and breath sounds normal. No respiratory distress. He has no wheezes. He has no rales.  Abdominal: Soft. Bowel sounds are normal. He exhibits no distension. There is no tenderness. There is no rebound.  Musculoskeletal: Normal range of motion.  Neurological: He is alert and oriented to person, place, and time.  Skin: Skin is warm and dry.  Psychiatric: He has a normal mood and affect. His behavior is normal. Judgment and thought content normal.    ED Course  Procedures (including critical care time)  Labs Reviewed  CBC WITH DIFFERENTIAL - Abnormal; Notable for the following:    WBC 12.3 (*)    HCT 37.1 (*)  MCV 74.3 (*)    MCHC 38.0 (*)    RDW 18.7 (*)    Neutrophils Relative 40 (*)    Lymphocytes Relative 48 (*)    Lymphs Abs 5.9 (*)    All other components within normal limits  RETICULOCYTES - Abnormal; Notable for the following:    Retic Ct Pct 5.2 (*)    Retic Count, Manual 259.5 (*)    All other components within normal limits  BASIC METABOLIC PANEL - Abnormal; Notable for the following:    Glucose, Bld 111 (*)    Calcium 10.6 (*)    GFR calc non Af Amer 74 (*)    GFR calc Af Amer 86 (*)    All other components within normal limits  POCT I-STAT TROPONIN I   No results found.   No diagnosis found.    MDM  Dean Webb is a 37 y.o. male hx of sickle cell disease  here with L neck swelling. Likely lymph node vs salivary duct stone. I discussed risk and benefit for CT neck. He chose to get pain meds for now and hold off on CT. Will give pain meds and some steroids to help with swelling.   11:06 PM Patient felt better after pain meds. Will d/c home on motrin, continue with morphine, and lemon drops. Return precautions given.       Richardean Canal, MD 04/11/12 [phone]

## 2012-04-11 NOTE — ED Notes (Signed)
Presents with left side of neck swelling that began today while eating a flounder fillet. Pt states, "it went down a little bit, but then I tried to eat again and it got as swollen as a softball and now it has gone down a little bit again" Denies SOB, handling secretions well, speaking in full sentences. Golf ball sized induration to left side of neck.  C/o pain swallowing makes pain worse, nothing makes pain better.

## 2012-04-11 NOTE — ED Notes (Signed)
Pt is refusing CT scan. When nurse asked pt why he wanted to refuse, pt stated "I don't like hospitals man." Nurse informed pt of risks of not having CT scan done. Pt communicated understanding. EDP notified.

## 2012-04-11 NOTE — ED Notes (Signed)
Pt was eating fish tonight and stated he felt like there was something under his tongue, pt stated that immediately his neck started swelling. Pt does have some notable swelling on left side of neck. Tender to palpation. Pt reporting painful when swallowing.

## 2012-11-01 ENCOUNTER — Other Ambulatory Visit (HOSPITAL_COMMUNITY)
Admission: RE | Admit: 2012-11-01 | Discharge: 2012-11-01 | Disposition: A | Discharge status: Home / Self Care | Payer: Medicaid Other | Source: Ambulatory Visit | Attending: Family Medicine | Admitting: Family Medicine

## 2012-11-01 ENCOUNTER — Emergency Department (HOSPITAL_COMMUNITY)
Admission: EM | Admit: 2012-11-01 | Discharge: 2012-11-01 | Disposition: A | Discharge status: Home / Self Care | Payer: Medicaid Other | Source: Home / Self Care | Attending: Family Medicine | Admitting: Family Medicine

## 2012-11-01 ENCOUNTER — Encounter (HOSPITAL_COMMUNITY): Payer: Self-pay | Admitting: Emergency Medicine

## 2012-11-01 DIAGNOSIS — N341 Nonspecific urethritis: Secondary | ICD-10-CM

## 2012-11-01 DIAGNOSIS — Z113 Encounter for screening for infections with a predominantly sexual mode of transmission: Secondary | ICD-10-CM | POA: Insufficient documentation

## 2012-11-01 LAB — POCT URINALYSIS DIP (DEVICE)
Bilirubin Urine: NEGATIVE
Hgb urine dipstick: NEGATIVE
Ketones, ur: NEGATIVE mg/dL
Leukocytes, UA: NEGATIVE
pH: 5.5 (ref 5.0–8.0)

## 2012-11-01 MED ORDER — CEFTRIAXONE SODIUM 250 MG IJ SOLR
INTRAMUSCULAR | Status: AC
  Filled 2012-11-01: qty 250

## 2012-11-01 MED ORDER — LIDOCAINE HCL (PF) 1 % IJ SOLN
INTRAMUSCULAR | Status: AC
  Filled 2012-11-01: qty 5

## 2012-11-01 MED ORDER — CEFTRIAXONE SODIUM 1 G IJ SOLR
250.0000 mg | Freq: Once | INTRAMUSCULAR | Status: AC
  Administered 2012-11-01: 250 mg via INTRAMUSCULAR

## 2012-11-01 MED ORDER — AZITHROMYCIN 250 MG PO TABS
1000.0000 mg | ORAL_TABLET | Freq: Once | ORAL | Status: AC
  Administered 2012-11-01: 1000 mg via ORAL

## 2012-11-01 MED ORDER — METRONIDAZOLE 500 MG PO TABS: 500.0000 mg | ORAL_TABLET | Freq: Two times a day (BID) | ORAL | Status: DC

## 2012-11-01 MED ORDER — AZITHROMYCIN 250 MG PO TABS
ORAL_TABLET | ORAL | Status: AC
  Filled 2012-11-01: qty 4

## 2012-11-01 NOTE — ED Notes (Signed)
C/o std check up.  Patient states partner knew she had something and patient states he was treated a health dept. Twice but is still having symptoms.

## 2012-11-01 NOTE — ED Provider Notes (Signed)
CSN: [id number]     Arrival date & time 11/01/12  1308 History   First MD Initiated Contact with Patient 11/01/12 1008     Chief Complaint  Patient presents with  . Exposure to STD   (Consider location/radiation/quality/duration/timing/severity/associated sxs/prior Treatment) Patient is a 37 y.o. male presenting with STD exposure. The history is provided by the patient.  Exposure to STD This is a new problem. The current episode started more than 1 week ago (treated x 2 at health dept, but sx continue, white d/c this am., no rash.). The problem has been gradually worsening. Pertinent negatives include no abdominal pain.    Past Medical History  Diagnosis Date  . Sickle cell disease   . Hypertension    Past Surgical History  Procedure Laterality Date  . Cholecystectomy     History reviewed. No pertinent family history. History  Substance Use Topics  . Smoking status: Current Some Day Smoker  . Smokeless tobacco: Not on file  . Alcohol Use: No    Review of Systems  Constitutional: Negative.   Gastrointestinal: Negative for abdominal pain.  Genitourinary: Positive for discharge. Negative for dysuria, penile swelling, scrotal swelling, penile pain and testicular pain.    Allergies  Review of patient's allergies indicates no known allergies.  Home Medications   Current Outpatient Rx  Name  Route  Sig  Dispense  Refill  . hydrochlorothiazide (HYDRODIURIL) 25 MG tablet   Oral   Take 25 mg by mouth daily.         Marland Kitchen morphine (MSIR) 30 MG tablet   Oral   Take 30 mg by mouth every 4 (four) hours as needed for pain (For sickle cell associated pain).         Marland Kitchen albuterol (PROVENTIL HFA;VENTOLIN HFA) 108 (90 BASE) MCG/ACT inhaler   Inhalation   Inhale 2 puffs into the lungs every 6 (six) hours as needed. wheezing         . ibuprofen (ADVIL,MOTRIN) 800 MG tablet   Oral   Take 1 tablet (800 mg total) by mouth 3 (three) times daily.   21 tablet   0   . metroNIDAZOLE  (FLAGYL) 500 MG tablet   Oral   Take 1 tablet (500 mg total) by mouth 2 (two) times daily.   14 tablet   0    BP 155/107  Pulse 93  Temp(Src) 98.2 F (36.8 C) (Oral)  Resp 18  SpO2 95% Physical Exam  Nursing note and vitals reviewed. Constitutional: He is oriented to person, place, and time. He appears well-developed and well-nourished.  Abdominal: Soft. Bowel sounds are normal.  Genitourinary: Penis normal. No penile tenderness.  Neurological: He is alert and oriented to person, place, and time.  Skin: Skin is warm and dry.    ED Course  Procedures (including critical care time) Labs Review Labs Reviewed  URINE CYTOLOGY ANCILLARY ONLY   Imaging Review No results found.  MDM      Linna Hoff, MD 11/01/12 1034

## 2012-11-06 NOTE — ED Notes (Signed)
Lab review

## 2012-11-06 NOTE — ED Notes (Signed)
All lab reports negative, no further action required 

## 2012-11-06 NOTE — ED Notes (Signed)
All lab reports negative, no further action required

## 2015-09-01 ENCOUNTER — Encounter (HOSPITAL_COMMUNITY): Payer: Self-pay | Admitting: Emergency Medicine

## 2015-09-01 ENCOUNTER — Emergency Department (HOSPITAL_COMMUNITY)
Admission: EM | Admit: 2015-09-01 | Discharge: 2015-09-02 | Disposition: A | Discharge status: Home / Self Care | Payer: Medicaid Other | Attending: Emergency Medicine | Admitting: Emergency Medicine

## 2015-09-01 DIAGNOSIS — F17200 Nicotine dependence, unspecified, uncomplicated: Secondary | ICD-10-CM | POA: Diagnosis not present

## 2015-09-01 DIAGNOSIS — I10 Essential (primary) hypertension: Secondary | ICD-10-CM | POA: Insufficient documentation

## 2015-09-01 DIAGNOSIS — J45909 Unspecified asthma, uncomplicated: Secondary | ICD-10-CM | POA: Diagnosis not present

## 2015-09-01 DIAGNOSIS — J329 Chronic sinusitis, unspecified: Secondary | ICD-10-CM | POA: Diagnosis not present

## 2015-09-01 DIAGNOSIS — Z79899 Other long term (current) drug therapy: Secondary | ICD-10-CM | POA: Diagnosis not present

## 2015-09-01 DIAGNOSIS — H5712 Ocular pain, left eye: Secondary | ICD-10-CM | POA: Insufficient documentation

## 2015-09-01 HISTORY — DX: Unspecified asthma, uncomplicated: J45.909

## 2015-09-01 HISTORY — DX: Sickle-cell disease without crisis: D57.1

## 2015-09-01 MED ORDER — ONDANSETRON HCL 4 MG/2ML IJ SOLN
4.0000 mg | Freq: Once | INTRAMUSCULAR | Status: AC
  Administered 2015-09-01: 4 mg via INTRAVENOUS
  Filled 2015-09-01: qty 2

## 2015-09-01 MED ORDER — IOPAMIDOL (ISOVUE-300) INJECTION 61%
INTRAVENOUS | Status: AC
  Administered 2015-09-02: 75 mL
  Filled 2015-09-01: qty 75

## 2015-09-01 MED ORDER — LISINOPRIL 10 MG PO TABS
10.0000 mg | ORAL_TABLET | Freq: Once | ORAL | Status: AC
  Administered 2015-09-01: 10 mg via ORAL
  Filled 2015-09-01: qty 1

## 2015-09-01 MED ORDER — TETRACAINE HCL 0.5 % OP SOLN
2.0000 [drp] | Freq: Once | OPHTHALMIC | Status: AC
  Administered 2015-09-01: 2 [drp] via OPHTHALMIC
  Filled 2015-09-01: qty 2

## 2015-09-01 MED ORDER — MORPHINE SULFATE (PF) 4 MG/ML IV SOLN
4.0000 mg | Freq: Once | INTRAVENOUS | Status: AC
  Administered 2015-09-01: 4 mg via INTRAVENOUS
  Filled 2015-09-01: qty 1

## 2015-09-01 MED ORDER — FLUORESCEIN SODIUM 1 MG OP STRP
1.0000 | ORAL_STRIP | Freq: Once | OPHTHALMIC | Status: AC
  Administered 2015-09-01: 1 via OPHTHALMIC
  Filled 2015-09-01: qty 1

## 2015-09-01 NOTE — ED Notes (Signed)
Patient c/o abd pain at this time MD aware

## 2015-09-01 NOTE — ED Provider Notes (Signed)
Complains of right-sided periorbital pain for the past 3 days. Reports mild pain with extraocular movement. No fever. Does admit to nasal congestion. Denies cough. No other associated symptoms. Pain is mild presently. On exam patient is alert no distress HEENT exam no facial asymmetry, redness or tenderness. Eyes pupils equal round reactive to light extraocular muscles intact. No septal conjunctival conjunctival erythema. No proptosis  Doug SouSam Jack Bolio, MD 09/01/15 2312

## 2015-09-01 NOTE — ED Provider Notes (Signed)
CSN: [id number]     Arrival date & time 09/01/15  1856 History   First MD Initiated Contact with Patient 09/01/15 2156     Chief Complaint  Patient presents with  . Facial Pain   (Consider location/radiation/quality/duration/timing/severity/associated sxs/prior Treatment) Patient is a 40 y.o. male presenting with eye pain. The history is provided by the patient.  Eye Pain This is a new problem. The current episode started in the past 7 days. The problem occurs constantly. The problem has been unchanged. Pertinent negatives include no abdominal pain, chest pain, chills, congestion, coughing, fever, nausea, neck pain, rash, sore throat or vomiting. Nothing aggravates the symptoms. He has tried nothing for the symptoms. The treatment provided no relief.   Past Medical History  Diagnosis Date  . Sickle cell disease (HCC)   . Hypertension   . Asthma   . Sickle cell anemia [facility](HCC)    Past Surgical History  Procedure Laterality Date  . Cholecystectomy     No family history on file. Social History  Substance Use Topics  . Smoking status: Current Some Day Smoker  . Smokeless tobacco: None  . Alcohol Use: No    Review of Systems  Constitutional: Negative for fever and chills.  HENT: Positive for facial swelling. Negative for congestion and sore throat.   Eyes: Positive for pain. Negative for redness.  Respiratory: Negative for cough and shortness of breath.   Cardiovascular: Negative for chest pain and palpitations.  Gastrointestinal: Negative for nausea, vomiting, abdominal pain and diarrhea.  Endocrine: Negative.   Genitourinary: Negative for flank pain.  Musculoskeletal: Negative for back pain and neck pain.  Skin: Negative for rash.  Allergic/Immunologic: Negative.   Neurological: Negative for dizziness, syncope and light-headedness.  Psychiatric/Behavioral: Negative for confusion.   Allergies  Review of patient's allergies indicates no known allergies.  Home Medications    Prior to Admission medications   Medication Sig Start Date End Date Taking? Authorizing Provider  albuterol (PROVENTIL HFA;VENTOLIN HFA) 108 (90 BASE) MCG/ACT inhaler Inhale 2 puffs into the lungs every 6 (six) hours as needed. wheezing   Yes Historical Provider, MD  cetirizine (ZYRTEC) 10 MG tablet Take 10 mg by mouth daily as needed for allergies.   Yes Historical Provider, MD  morphine (MSIR) 30 MG tablet Take 30 mg by mouth every 4 (four) hours as needed for pain (For sickle cell associated pain).   Yes Historical Provider, MD  omeprazole (PRILOSEC) 20 MG capsule Take 20 mg by mouth daily as needed (acid reflux).   Yes Historical Provider, MD  oxycodone (ROXICODONE) 30 MG immediate release tablet Take 30 mg by mouth every 4 (four) hours as needed for pain.   Yes Historical Provider, MD  amoxicillin-clavulanate (AUGMENTIN) 875-125 MG tablet Take 1 tablet by mouth 2 (two) times daily. 09/02/15   Tery SanfilippoMatthew Latina Frank, MD   BP 131/105 mmHg  Pulse 103  Temp(Src) 98 F (36.7 C) (Oral)  Resp 20  Ht 5\' 7"  (1.702 m)  Wt 88.593 kg  BMI 30.58 kg/m2  SpO2 96% Physical Exam  Constitutional: He is oriented to person, place, and time. He appears well-developed and well-nourished.  HENT:  Head: Normocephalic and atraumatic.    Eyes: Conjunctivae and EOM are normal. Pupils are equal, round, and reactive to light. Right eye exhibits no chemosis and no discharge. Left eye exhibits no chemosis and no discharge. Right conjunctiva is not injected. No scleral icterus.  Pressures: R 12/95 L 17/95  Fluorecin R eye normal.   Neck: Normal  range of motion. Neck supple.  Cardiovascular: Normal rate, regular rhythm, normal heart sounds and intact distal pulses.   Pulmonary/Chest: Effort normal and breath sounds normal. No respiratory distress.  Abdominal: Soft. Bowel sounds are normal. There is no tenderness.  Musculoskeletal: Normal range of motion.  Neurological: He is alert and oriented to person, place,  and time. He has normal reflexes. No cranial nerve deficit.  Skin: Skin is warm and dry.    ED Course  Procedures (including critical care time) Labs Review Labs Reviewed - No data to display  Imaging Review Ct Orbits W Contrast  09/02/2015  CLINICAL DATA:  RIGHT eye and facial pain, nasal congestion for many months, severe for 3 days. History of sickle cell disease and hypertension. EXAM: CT ORBITS WITH CONTRAST TECHNIQUE: Multidetector CT imaging of the orbits was performed following the bolus administration of intravenous contrast. CONTRAST:  75 cc ISOVUE-300 IOPAMIDOL (ISOVUE-300) INJECTION 61% COMPARISON:  None. FINDINGS: ORBITS: Ocular globes intact, homogeneous appearance of vitreous. Ocular lenses are located. Normal appearance of the optic nerve sheath complexes and extra-ocular muscles. Preservation of orbital fat. Normal appearance of lacrimal glands. SINUSES: Mild paranasal sinus mucosal thickening without air-fluid levels. No abnormal expansion or destructive bony changes. INTRACRANIAL CONTENTS: Normal. OSSEOUS STRUCTURES/SOFT TISSUES: Normal. IMPRESSION: Mild paranasal sinusitis. Otherwise negative contrast-enhanced CT orbits. Electronically Signed   By: Awilda Metro M.D.   On: 09/02/2015 01:28   I have personally reviewed and evaluated these images and lab results as part of my medical decision-making.   EKG Interpretation None      MDM   Final diagnoses:  Left eye pain  Sinusitis, unspecified chronicity, unspecified location    40 year old male presented today for right thigh pain and reported swelling around his right eye. Denies any fevers. Increasing over one week associated with mild dry cough and congestion.  On exam patient is HDS in NAD. No marked swelling around his right eye. Tenderness palpation over his sinuses. I evaluated with normal visual acuity with pupils equal and reactive to light bilaterally. Fluorescein stained and normal. Bilateral eye pressures  within normal limits.   Patient hypertensive but no acute neurologic deficits on exam and low suspicion for hypertensive emergency. Likely chronic and patient reports not being compliant with his antihypertensives at home. Do not feel labs ECG or head CT warranted at this time.  Dose of oral lisinopril given in ED with alleviation of HTN.   Due to some pain with movement of eyes CT orbits ordered.  CT displayed likely sinusitis.  Rx for Augmentin given and advised to f/u with PCP if not resolving in 3 days.   Images were viewed by myself  incorporated into medical decision making.  Discussed pertinent finding with patient or caregiver prior to discharge with no further questions.  Immediate return precautions given and understood.  Medical decision making supervised by my attending Dr. Ethelda Chick.  Tery Sanfilippo, MD PGY-3 Emergency Medicine     Tery Sanfilippo, MD 09/02/15 1038  Doug Sou, MD 09/02/15 1410

## 2015-09-01 NOTE — ED Notes (Addendum)
Patient states he has had a cough and nasal congestion for a couple of days and has been taking Zyrtec without relief.  C/o pain to the right side of his face around his eye and into the temple area - area noted to be swollen.  History of HTN, take 2 meds but has not had them today  Patient also reports that he does not have any bad teeth

## 2015-09-01 NOTE — ED Notes (Addendum)
Pt. reports right facial pain with nasal congestion unrelieved by Zyrtec , denies facial injury . Hypertensive at arrival .

## 2015-09-02 ENCOUNTER — Emergency Department (HOSPITAL_COMMUNITY): Payer: Medicaid Other

## 2015-09-02 ENCOUNTER — Encounter (HOSPITAL_COMMUNITY): Payer: Self-pay | Admitting: Radiology

## 2015-09-02 MED ORDER — AMOXICILLIN-POT CLAVULANATE 875-125 MG PO TABS: 1.0000 | ORAL_TABLET | Freq: Two times a day (BID) | ORAL | Status: DC

## 2015-09-02 NOTE — Discharge Instructions (Signed)
Sinusitis, Adult  Sinusitis is redness, soreness, and puffiness (inflammation) of the air pockets in the bones of your face (sinuses). The redness, soreness, and puffiness can cause air and mucus to get trapped in your sinuses. This can allow germs to grow and cause an infection.   HOME CARE    Drink enough fluids to keep your pee (urine) clear or pale yellow.   Use a humidifier in your home.   Run a hot shower to create steam in the bathroom. Sit in the bathroom with the door closed. Breathe in the steam 3-4 times a day.   Put a warm, moist washcloth on your face 3-4 times a day, or as told by your doctor.   Use salt water sprays (saline sprays) to wet the thick fluid in your nose. This can help the sinuses drain.   Only take medicine as told by your doctor.  GET HELP RIGHT AWAY IF:    Your pain gets worse.   You have very bad headaches.   You are sick to your stomach (nauseous).   You throw up (vomit).   You are very sleepy (drowsy) all the time.   Your face is puffy (swollen).   Your vision changes.   You have a stiff neck.   You have trouble breathing.  MAKE SURE YOU:    Understand these instructions.   Will watch your condition.   Will get help right away if you are not doing well or get worse.     This information is not intended to replace advice given to you by your health care provider. Make sure you discuss any questions you have with your health care provider.     Document Released: 07/19/2007 Document Revised: 02/20/2014 Document Reviewed: 09/05/2011  Elsevier Interactive Patient Education 2016 [employer]

## 2015-09-02 NOTE — ED Notes (Signed)
Patient transported to CT 

## 2016-03-07 ENCOUNTER — Encounter (HOSPITAL_COMMUNITY): Payer: Self-pay | Admitting: Emergency Medicine

## 2016-03-07 ENCOUNTER — Emergency Department (HOSPITAL_COMMUNITY): Payer: Medicaid Other

## 2016-03-07 ENCOUNTER — Inpatient Hospital Stay (HOSPITAL_COMMUNITY)
Admission: EM | Admit: 2016-03-07 | Discharge: 2016-03-11 | DRG: 812 | Discharge status: Against Medical Advice | Payer: Medicaid Other | Attending: Internal Medicine | Admitting: Internal Medicine

## 2016-03-07 DIAGNOSIS — K219 Gastro-esophageal reflux disease without esophagitis: Secondary | ICD-10-CM | POA: Diagnosis present

## 2016-03-07 DIAGNOSIS — D638 Anemia in other chronic diseases classified elsewhere: Secondary | ICD-10-CM | POA: Diagnosis present

## 2016-03-07 DIAGNOSIS — R61 Generalized hyperhidrosis: Secondary | ICD-10-CM | POA: Diagnosis present

## 2016-03-07 DIAGNOSIS — Z833 Family history of diabetes mellitus: Secondary | ICD-10-CM

## 2016-03-07 DIAGNOSIS — Z8249 Family history of ischemic heart disease and other diseases of the circulatory system: Secondary | ICD-10-CM

## 2016-03-07 DIAGNOSIS — Z825 Family history of asthma and other chronic lower respiratory diseases: Secondary | ICD-10-CM

## 2016-03-07 DIAGNOSIS — J4520 Mild intermittent asthma, uncomplicated: Secondary | ICD-10-CM | POA: Diagnosis present

## 2016-03-07 DIAGNOSIS — G4733 Obstructive sleep apnea (adult) (pediatric): Secondary | ICD-10-CM | POA: Diagnosis present

## 2016-03-07 DIAGNOSIS — Z79891 Long term (current) use of opiate analgesic: Secondary | ICD-10-CM

## 2016-03-07 DIAGNOSIS — R42 Dizziness and giddiness: Secondary | ICD-10-CM | POA: Diagnosis present

## 2016-03-07 DIAGNOSIS — F17210 Nicotine dependence, cigarettes, uncomplicated: Secondary | ICD-10-CM | POA: Diagnosis present

## 2016-03-07 DIAGNOSIS — I10 Essential (primary) hypertension: Secondary | ICD-10-CM | POA: Diagnosis not present

## 2016-03-07 DIAGNOSIS — R0789 Other chest pain: Secondary | ICD-10-CM | POA: Diagnosis present

## 2016-03-07 DIAGNOSIS — R0902 Hypoxemia: Secondary | ICD-10-CM

## 2016-03-07 DIAGNOSIS — G8929 Other chronic pain: Secondary | ICD-10-CM | POA: Diagnosis present

## 2016-03-07 DIAGNOSIS — D72829 Elevated white blood cell count, unspecified: Secondary | ICD-10-CM | POA: Diagnosis present

## 2016-03-07 DIAGNOSIS — D5700 Hb-SS disease with crisis, unspecified: Principal | ICD-10-CM | POA: Diagnosis present

## 2016-03-07 DIAGNOSIS — R072 Precordial pain: Secondary | ICD-10-CM | POA: Diagnosis present

## 2016-03-07 HISTORY — DX: Essential (primary) hypertension: I10

## 2016-03-07 HISTORY — DX: Gastro-esophageal reflux disease without esophagitis: K21.9

## 2016-03-07 HISTORY — DX: Sickle-cell/Hb-C disease without crisis: D57.20

## 2016-03-07 LAB — CBC WITH DIFFERENTIAL/PLATELET
Basophils Absolute: 0 10*3/uL (ref 0.0–0.1)
Basophils Relative: 0 %
EOS PCT: 1 %
Eosinophils Absolute: 0.1 10*3/uL (ref 0.0–0.7)
HEMATOCRIT: 37.4 % — AB (ref 39.0–52.0)
HEMOGLOBIN: 13.8 g/dL (ref 13.0–17.0)
LYMPHS PCT: 39 %
Lymphs Abs: 4.5 10*3/uL — ABNORMAL HIGH (ref 0.7–4.0)
MCH: 28.8 pg (ref 26.0–34.0)
MCHC: 36.9 g/dL — AB (ref 30.0–36.0)
MCV: 78.1 fL (ref 78.0–100.0)
MONOS PCT: 12 %
Monocytes Absolute: 1.4 10*3/uL — ABNORMAL HIGH (ref 0.1–1.0)
NEUTROS ABS: 5.6 10*3/uL (ref 1.7–7.7)
Neutrophils Relative %: 48 %
Platelets: 252 10*3/uL (ref 150–400)
RBC: 4.79 MIL/uL (ref 4.22–5.81)
RDW: 16.6 % — ABNORMAL HIGH (ref 11.5–15.5)
WBC: 11.6 10*3/uL — ABNORMAL HIGH (ref 4.0–10.5)

## 2016-03-07 LAB — I-STAT TROPONIN, ED: TROPONIN I, POC: 0.01 ng/mL (ref 0.00–0.08)

## 2016-03-07 LAB — COMPREHENSIVE METABOLIC PANEL
ALBUMIN: 4.6 g/dL (ref 3.5–5.0)
ALT: 16 U/L — ABNORMAL LOW (ref 17–63)
ANION GAP: 8 (ref 5–15)
AST: 22 U/L (ref 15–41)
Alkaline Phosphatase: 79 U/L (ref 38–126)
CHLORIDE: 104 mmol/L (ref 101–111)
CO2: 26 mmol/L (ref 22–32)
Calcium: 10.7 mg/dL — ABNORMAL HIGH (ref 8.9–10.3)
Creatinine, Ser: 1.29 mg/dL — ABNORMAL HIGH (ref 0.61–1.24)
GFR calc Af Amer: >60 mL/min (ref >60)
GFR calc non Af Amer: >60 mL/min (ref >60)
GLUCOSE: 102 mg/dL — AB (ref 65–99)
POTASSIUM: 3.5 mmol/L (ref 3.5–5.1)
SODIUM: 138 mmol/L (ref 135–145)
Total Bilirubin: 1.9 mg/dL — ABNORMAL HIGH (ref 0.3–1.2)
Total Protein: 7.4 g/dL (ref 6.5–8.1)

## 2016-03-07 LAB — RETICULOCYTES
RBC.: 4.79 MIL/uL (ref 4.22–5.81)
Retic Count, Absolute: 143.7 10*3/uL (ref 19.0–186.0)
Retic Ct Pct: 3 % (ref 0.4–3.1)

## 2016-03-07 MED ORDER — SODIUM CHLORIDE 0.45 % IV SOLN
INTRAVENOUS | Status: DC
  Administered 2016-03-07: via INTRAVENOUS

## 2016-03-07 MED ORDER — HYDROMORPHONE HCL 2 MG/ML IJ SOLN
2.0000 mg | INTRAMUSCULAR | Status: AC
  Administered 2016-03-08: 2 mg via INTRAVENOUS
  Filled 2016-03-07: qty 1

## 2016-03-07 MED ORDER — HYDROMORPHONE HCL 2 MG/ML IJ SOLN
2.0000 mg | INTRAMUSCULAR | Status: DC
  Filled 2016-03-07: qty 1

## 2016-03-07 MED ORDER — HYDROMORPHONE HCL 2 MG/ML IJ SOLN: 2.0000 mg | INTRAMUSCULAR | Status: AC

## 2016-03-07 MED ORDER — HYDROMORPHONE HCL 2 MG/ML IJ SOLN
2.0000 mg | INTRAMUSCULAR | Status: AC
  Administered 2016-03-07: 2 mg via INTRAVENOUS
  Filled 2016-03-07: qty 1

## 2016-03-07 MED ORDER — ONDANSETRON HCL 4 MG/2ML IJ SOLN
4.0000 mg | INTRAMUSCULAR | Status: DC | PRN
  Administered 2016-03-07: 4 mg via INTRAVENOUS
  Filled 2016-03-07: qty 2

## 2016-03-07 MED ORDER — HYDROMORPHONE HCL 2 MG/ML IJ SOLN
2.0000 mg | INTRAMUSCULAR | Status: DC
Start: 2016-03-08 — End: 2016-03-08
  Filled 2016-03-07: qty 1

## 2016-03-07 NOTE — ED Notes (Signed)
Pt asleep. Resting comfortably

## 2016-03-07 NOTE — ED Notes (Signed)
Unable to locate pt. at waiting area several times.  

## 2016-03-07 NOTE — ED Triage Notes (Signed)
Pt states he has sickle cell and has been having generalized pain today along with CP. Denies N/V.

## 2016-03-07 NOTE — ED Provider Notes (Signed)
[facility]-EMERGENCY DEPT Provider Note   CSN: [id number] Arrival date & time: 03/07/16  1714   By signing my name below, I, Clovis Pu, attest that this documentation has been prepared under the direction and in the presence of Dione Booze, MD  Electronically Signed: Clovis Pu, ED Scribe. 03/07/16. 11:35 PM.   History   Chief Complaint Chief Complaint  Patient presents with  . Chest Pain  . Sickle Cell Pain Crisis   The history is provided by the patient. No language interpreter was used.   HPI Comments:  Dean Webb is a 41 y.o. male, with a hx of HTN, sickle cell anemia and [place] sickle cell disease, who presents to the Emergency Department complaining of "8/10", sharp chest pain onset yesterday. His pain is worse with breathing. Pt also reports chills, body aches and diaphoresis. He takes 60 mg of morphine for his sickle cell pain. Pt denies cough, nausea, vomiting, diarrhea, change in appetite, change in fluid intake and any other associated symptoms at this time. He notes he has experienced similar pain before related to sickle cell but notes the chills are new.   Past Medical History:  Diagnosis Date  . Asthma   . Hypertension   . Sickle cell anemia (HCC)   . Sickle cell disease [facility])     Patient Active Problem List   Diagnosis Date Noted  . Sickle cell disease (HCC)     Past Surgical History:  Procedure Laterality Date  . CHOLECYSTECTOMY         Home Medications    Prior to Admission medications   Medication Sig Start Date End Date Taking? Authorizing Provider  albuterol (PROVENTIL HFA;VENTOLIN HFA) 108 (90 BASE) MCG/ACT inhaler Inhale 2 puffs into the lungs every 6 (six) hours as needed. wheezing    Historical Provider, MD  amoxicillin-clavulanate (AUGMENTIN) 875-125 MG tablet Take 1 tablet by mouth 2 (two) times daily. 09/02/15   Tery Sanfilippo, MD  cetirizine (ZYRTEC) 10 MG tablet Take 10 mg by mouth daily as needed for allergies.    Historical Provider,  MD  morphine (MSIR) 30 MG tablet Take 30 mg by mouth every 4 (four) hours as needed for pain (For sickle cell associated pain).    Historical Provider, MD  omeprazole (PRILOSEC) 20 MG capsule Take 20 mg by mouth daily as needed (acid reflux).    Historical Provider, MD  oxycodone (ROXICODONE) 30 MG immediate release tablet Take 30 mg by mouth every 4 (four) hours as needed for pain.    Historical Provider, MD    Family History History reviewed. No pertinent family history.  Social History Social History  Substance Use Topics  . Smoking status: Current Some Day Smoker  . Smokeless tobacco: Never Used  . Alcohol use No     Allergies   Patient has no known allergies.   Review of Systems Review of Systems  Constitutional: Positive for chills and diaphoresis. Negative for appetite change.  Respiratory: Negative for cough.   Cardiovascular: Positive for chest pain.  Gastrointestinal: Negative for diarrhea, nausea and vomiting.  Musculoskeletal: Positive for myalgias.  All other systems reviewed and are negative.    Physical Exam Updated Vital Signs BP 103/74   Pulse 63   Temp 97.7 F (36.5 C) (Oral)   Resp 16   Ht 5\' 7"  (1.702 m)   Wt 197 lb (89.4 kg)   SpO2 96%   BMI 30.85 kg/m   Physical Exam  Constitutional: He is oriented to person, place,  and time. He appears well-developed and well-nourished.  HENT:  Head: Normocephalic and atraumatic.  Eyes: EOM are normal. Pupils are equal, round, and reactive to light.  Neck: Normal range of motion. Neck supple. No JVD present.  Cardiovascular: Normal rate, regular rhythm and normal heart sounds.   No murmur heard. Pulmonary/Chest: Effort normal and breath sounds normal. He has no wheezes. He has no rales. He exhibits no tenderness.  Abdominal: Soft. Bowel sounds are normal. He exhibits no distension and no mass. There is no tenderness.  Musculoskeletal: Normal range of motion. He exhibits no edema.  Lymphadenopathy:    He  has no cervical adenopathy.  Neurological: He is alert and oriented to person, place, and time. No cranial nerve deficit. He exhibits normal muscle tone. Coordination normal.  Skin: Skin is warm and dry. No rash noted.  Psychiatric: He has a normal mood and affect. His behavior is normal. Judgment and thought content normal.  Nursing note and vitals reviewed.    ED Treatments / Results  DIAGNOSTIC STUDIES:  Oxygen Saturation is 96% on RA, normal by my interpretation.    COORDINATION OF CARE:  11:33 PM Discussed treatment plan with pt at bedside and pt agreed to plan.  Labs (all labs ordered are listed, but only abnormal results are displayed) Labs Reviewed  COMPREHENSIVE METABOLIC PANEL - Abnormal; Notable for the following:       Result Value   Glucose, Bld 102 (*)    BUN <5 (*)    Creatinine, Ser 1.29 (*)    Calcium 10.7 (*)    ALT 16 (*)    Total Bilirubin 1.9 (*)    All other components within normal limits  CBC WITH DIFFERENTIAL/PLATELET - Abnormal; Notable for the following:    WBC 11.6 (*)    HCT 37.4 (*)    MCHC 36.9 (*)    RDW 16.6 (*)    Lymphs Abs 4.5 (*)    Monocytes Absolute 1.4 (*)    All other components within normal limits  RETICULOCYTES  I-STAT TROPOININ, ED    EKG  EKG Interpretation  Date/Time:  Tuesday March 07 2016 19:10:51 EST Ventricular Rate:  92 PR Interval:  128 QRS Duration: 80 QT Interval:  372 QTC Calculation: 460 R Axis:   39 Text Interpretation:  Normal sinus rhythm Nonspecific ST and T wave abnormality Abnormal ECG When compared with ECG of 11/05/2008, No significant change was found Confirmed by [facility]  MD, Zamarion Longest ([id number]) on 03/07/2016 11:19:02 PM       Radiology Dg Chest 2 View  Result Date: 03/07/2016 CLINICAL DATA:  Generalized pain in the mid chest with some shortness of breath EXAM: CHEST  2 VIEW COMPARISON:  08/04/2010 FINDINGS: The heart size and mediastinal contours are within normal limits. Both lungs are clear. The  visualized skeletal structures are unremarkable. IMPRESSION: No active cardiopulmonary disease. Electronically Signed   By: Kennith Center M.D.   On: 03/07/2016 20:49    Procedures Procedures (including critical care time)  Medications Ordered in ED Medications  0.45 % sodium chloride infusion ( Intravenous New Bag/Given 03/07/16 2349)  HYDROmorphone (DILAUDID) injection 2 mg ( Intravenous See Alternative 03/08/16 0728)    Or  HYDROmorphone (DILAUDID) injection 2 mg (2 mg Subcutaneous Not Given 03/08/16 0728)  ondansetron (ZOFRAN) injection 4 mg (4 mg Intravenous Given 03/07/16 2349)  HYDROmorphone (DILAUDID) injection 2 mg (2 mg Intravenous Given 03/07/16 2349)    Or  HYDROmorphone (DILAUDID) injection 2 mg ( Subcutaneous See Alternative  03/07/16 2349)  HYDROmorphone (DILAUDID) injection 2 mg (2 mg Intravenous Given 03/08/16 0026)    Or  HYDROmorphone (DILAUDID) injection 2 mg ( Subcutaneous See Alternative 03/08/16 0026)  HYDROmorphone (DILAUDID) injection 2 mg (2 mg Intravenous Given 03/08/16 0112)    Or  HYDROmorphone (DILAUDID) injection 2 mg ( Subcutaneous See Alternative 03/08/16 0112)     Initial Impression / Assessment and Plan / ED Course  I have reviewed the triage vital signs and the nursing notes.  Pertinent labs & imaging results that were available during my care of the patient were reviewed by me and considered in my medical decision making (see chart for details).  Sickle cell vaso-occlusive crisis in patient with sickle [place] disease. Old records are reviewed, and I actually do not see any prior ED visits for pain crises, and no hospitalizations. He is started on sickle cell protocol. Although he has chest pain, oxygen saturation is normal, heart rate is normal, and chest x-ray is unremarkable. After several doses of intravenous hydromorphone, patient was somnolent but still complaining of ongoing pain. K consultation was obtained with [facility]ad hospitalists. Dr. Julian ReilGardner saw the patient  and stated that he would not admit the patient because of his degree of somnolence and he was concerned about his getting additional narcotics on the floor. He was observed and given no additional narcotics. However, at this point, he was requiring oxygen. He was observed for 4 hours and was still somnolent and would still get hypoxic when oxygen was discontinued-oxygen saturation is dropping to 88%. At this point, [facility] hospitalists will be reconsulted to admit the patient under observation status to continue to observe him until he wakes up appropriately and is maintaining adequate oxygenation on room air.  Final Clinical Impressions(s) / ED Diagnoses   Final diagnoses:  Sickle cell pain crisis (HCC)  Hypoxia    New Prescriptions New Prescriptions   No medications on file   I personally performed the services described in this documentation, which was scribed in my presence. The recorded information has been reviewed and is accurate.       Dione Boozeavid Yossef Gilkison, MD 03/08/16 [id number]

## 2016-03-07 NOTE — ED Notes (Signed)
Went to assess pt, will not stay awake long enough for this RN to assess.

## 2016-03-07 NOTE — ED Triage Notes (Signed)
CAlled for pt two separate occassions. No answer

## 2016-03-08 ENCOUNTER — Emergency Department (HOSPITAL_COMMUNITY): Payer: Medicaid Other

## 2016-03-08 ENCOUNTER — Encounter (HOSPITAL_COMMUNITY): Payer: Self-pay | Admitting: Radiology

## 2016-03-08 DIAGNOSIS — J4520 Mild intermittent asthma, uncomplicated: Secondary | ICD-10-CM | POA: Diagnosis present

## 2016-03-08 DIAGNOSIS — G4730 Sleep apnea, unspecified: Secondary | ICD-10-CM | POA: Diagnosis not present

## 2016-03-08 DIAGNOSIS — R0789 Other chest pain: Secondary | ICD-10-CM | POA: Diagnosis present

## 2016-03-08 DIAGNOSIS — Z833 Family history of diabetes mellitus: Secondary | ICD-10-CM | POA: Diagnosis not present

## 2016-03-08 DIAGNOSIS — I10 Essential (primary) hypertension: Secondary | ICD-10-CM

## 2016-03-08 DIAGNOSIS — D72829 Elevated white blood cell count, unspecified: Secondary | ICD-10-CM | POA: Diagnosis present

## 2016-03-08 DIAGNOSIS — Z8249 Family history of ischemic heart disease and other diseases of the circulatory system: Secondary | ICD-10-CM | POA: Diagnosis not present

## 2016-03-08 DIAGNOSIS — G4733 Obstructive sleep apnea (adult) (pediatric): Secondary | ICD-10-CM | POA: Diagnosis present

## 2016-03-08 DIAGNOSIS — D57219 Sickle-cell/Hb-C disease with crisis, unspecified: Secondary | ICD-10-CM

## 2016-03-08 DIAGNOSIS — K219 Gastro-esophageal reflux disease without esophagitis: Secondary | ICD-10-CM

## 2016-03-08 DIAGNOSIS — R0902 Hypoxemia: Secondary | ICD-10-CM | POA: Diagnosis not present

## 2016-03-08 DIAGNOSIS — G8929 Other chronic pain: Secondary | ICD-10-CM | POA: Diagnosis present

## 2016-03-08 DIAGNOSIS — R61 Generalized hyperhidrosis: Secondary | ICD-10-CM | POA: Diagnosis present

## 2016-03-08 DIAGNOSIS — R072 Precordial pain: Secondary | ICD-10-CM | POA: Diagnosis present

## 2016-03-08 DIAGNOSIS — F17210 Nicotine dependence, cigarettes, uncomplicated: Secondary | ICD-10-CM | POA: Diagnosis present

## 2016-03-08 DIAGNOSIS — R42 Dizziness and giddiness: Secondary | ICD-10-CM | POA: Diagnosis present

## 2016-03-08 DIAGNOSIS — D5700 Hb-SS disease with crisis, unspecified: Secondary | ICD-10-CM | POA: Diagnosis present

## 2016-03-08 DIAGNOSIS — Z825 Family history of asthma and other chronic lower respiratory diseases: Secondary | ICD-10-CM | POA: Diagnosis not present

## 2016-03-08 DIAGNOSIS — G894 Chronic pain syndrome: Secondary | ICD-10-CM | POA: Diagnosis not present

## 2016-03-08 DIAGNOSIS — Z79891 Long term (current) use of opiate analgesic: Secondary | ICD-10-CM | POA: Diagnosis not present

## 2016-03-08 DIAGNOSIS — K5900 Constipation, unspecified: Secondary | ICD-10-CM | POA: Diagnosis not present

## 2016-03-08 DIAGNOSIS — D638 Anemia in other chronic diseases classified elsewhere: Secondary | ICD-10-CM | POA: Diagnosis present

## 2016-03-08 LAB — TECHNOLOGIST SMEAR REVIEW

## 2016-03-08 MED ORDER — DEXTROSE-NACL 5-0.45 % IV SOLN
INTRAVENOUS | Status: DC
  Administered 2016-03-08 – 2016-03-09 (×5): via INTRAVENOUS

## 2016-03-08 MED ORDER — NALOXONE HCL 0.4 MG/ML IJ SOLN: 0.4000 mg | INTRAMUSCULAR | Status: DC | PRN

## 2016-03-08 MED ORDER — PNEUMOCOCCAL VAC POLYVALENT 25 MCG/0.5ML IJ INJ
0.5000 mL | INJECTION | INTRAMUSCULAR | Status: AC
  Administered 2016-03-09: 0.5 mL via INTRAMUSCULAR
  Filled 2016-03-08 (×2): qty 0.5

## 2016-03-08 MED ORDER — HYDROMORPHONE HCL 1 MG/ML IJ SOLN
1.0000 mg | INTRAMUSCULAR | Status: DC
  Administered 2016-03-08 – 2016-03-10 (×13): 1 mg via INTRAVENOUS
  Filled 2016-03-08 (×13): qty 1

## 2016-03-08 MED ORDER — IOPAMIDOL (ISOVUE-370) INJECTION 76%
INTRAVENOUS | Status: AC
  Administered 2016-03-08: 100 mL
  Filled 2016-03-08: qty 100

## 2016-03-08 MED ORDER — CHLORTHALIDONE 25 MG PO TABS
25.0000 mg | ORAL_TABLET | Freq: Once | ORAL | Status: AC
Start: 2016-03-08 — End: 2016-03-08
  Administered 2016-03-08: 25 mg via ORAL
  Filled 2016-03-08: qty 1

## 2016-03-08 MED ORDER — MORPHINE SULFATE ER 30 MG PO TBCR
60.0000 mg | EXTENDED_RELEASE_TABLET | Freq: Two times a day (BID) | ORAL | Status: DC
  Administered 2016-03-08 – 2016-03-11 (×6): 60 mg via ORAL
  Filled 2016-03-08 (×6): qty 2

## 2016-03-08 MED ORDER — MORPHINE SULFATE 30 MG PO TABS
60.0000 mg | ORAL_TABLET | Freq: Two times a day (BID) | ORAL | Status: DC | PRN
  Filled 2016-03-08: qty 2

## 2016-03-08 MED ORDER — POLYETHYLENE GLYCOL 3350 17 G PO PACK
17.0000 g | PACK | Freq: Every day | ORAL | Status: DC | PRN
  Filled 2016-03-08: qty 1

## 2016-03-08 MED ORDER — HYDROMORPHONE HCL 2 MG/ML IJ SOLN
2.0000 mg | Freq: Once | INTRAMUSCULAR | Status: AC
  Administered 2016-03-08: 2 mg via INTRAVENOUS

## 2016-03-08 MED ORDER — SODIUM CHLORIDE 0.9% FLUSH: 9.0000 mL | INTRAVENOUS | Status: DC | PRN

## 2016-03-08 MED ORDER — SENNOSIDES-DOCUSATE SODIUM 8.6-50 MG PO TABS
1.0000 | ORAL_TABLET | Freq: Two times a day (BID) | ORAL | Status: DC
  Administered 2016-03-08 – 2016-03-11 (×6): 1 via ORAL
  Filled 2016-03-08 (×6): qty 1

## 2016-03-08 MED ORDER — LORATADINE 10 MG PO TABS
10.0000 mg | ORAL_TABLET | Freq: Every day | ORAL | Status: DC
  Administered 2016-03-08 – 2016-03-11 (×4): 10 mg via ORAL
  Filled 2016-03-08 (×4): qty 1

## 2016-03-08 MED ORDER — OXYCODONE HCL 5 MG PO TABS
30.0000 mg | ORAL_TABLET | Freq: Two times a day (BID) | ORAL | Status: DC | PRN
  Administered 2016-03-08: 30 mg via ORAL
  Filled 2016-03-08: qty 6

## 2016-03-08 MED ORDER — INFLUENZA VAC SPLIT QUAD 0.5 ML IM SUSY
0.5000 mL | PREFILLED_SYRINGE | INTRAMUSCULAR | Status: AC
  Administered 2016-03-09: 0.5 mL via INTRAMUSCULAR
  Filled 2016-03-08: qty 0.5

## 2016-03-08 MED ORDER — SODIUM CHLORIDE 0.9 % IV SOLN: 25.0000 mg | INTRAVENOUS | Status: DC | PRN

## 2016-03-08 MED ORDER — DIPHENHYDRAMINE HCL 25 MG PO CAPS: 25.0000 mg | ORAL_CAPSULE | ORAL | Status: DC | PRN

## 2016-03-08 MED ORDER — ENOXAPARIN SODIUM 40 MG/0.4ML ~~LOC~~ SOLN
40.0000 mg | SUBCUTANEOUS | Status: DC
  Administered 2016-03-08 – 2016-03-10 (×3): 40 mg via SUBCUTANEOUS
  Filled 2016-03-08 (×3): qty 0.4

## 2016-03-08 MED ORDER — ONDANSETRON HCL 4 MG/2ML IJ SOLN
4.0000 mg | Freq: Four times a day (QID) | INTRAMUSCULAR | Status: DC | PRN
  Administered 2016-03-08 – 2016-03-11 (×3): 4 mg via INTRAVENOUS
  Filled 2016-03-08 (×3): qty 2

## 2016-03-08 MED ORDER — KETOROLAC TROMETHAMINE 30 MG/ML IJ SOLN
30.0000 mg | Freq: Four times a day (QID) | INTRAMUSCULAR | Status: DC
  Administered 2016-03-08 – 2016-03-11 (×12): 30 mg via INTRAVENOUS
  Filled 2016-03-08 (×12): qty 1

## 2016-03-08 MED ORDER — PANTOPRAZOLE SODIUM 40 MG PO TBEC
40.0000 mg | DELAYED_RELEASE_TABLET | Freq: Every day | ORAL | Status: DC
  Administered 2016-03-08 – 2016-03-11 (×4): 40 mg via ORAL
  Filled 2016-03-08 (×4): qty 1

## 2016-03-08 MED ORDER — HYDROMORPHONE HCL 2 MG/ML IJ SOLN
1.0000 mg | Freq: Once | INTRAMUSCULAR | Status: AC
  Administered 2016-03-08: 1 mg via INTRAVENOUS
  Filled 2016-03-08: qty 1

## 2016-03-08 MED ORDER — ALBUTEROL SULFATE (2.5 MG/3ML) 0.083% IN NEBU: 3.0000 mL | INHALATION_SOLUTION | Freq: Four times a day (QID) | RESPIRATORY_TRACT | Status: DC | PRN

## 2016-03-08 MED ORDER — HYDROMORPHONE 1 MG/ML IV SOLN
INTRAVENOUS | Status: DC
  Administered 2016-03-08: 19:00:00 via INTRAVENOUS
  Administered 2016-03-09: 0.5 mg via INTRAVENOUS
  Administered 2016-03-09: 0 mg via INTRAVENOUS
  Administered 2016-03-09: 1 mg via INTRAVENOUS
  Administered 2016-03-09 – 2016-03-10 (×2): 0.5 mg via INTRAVENOUS
  Administered 2016-03-10: 1 mg via INTRAVENOUS
  Administered 2016-03-10: 0 mg via INTRAVENOUS
  Administered 2016-03-10: 1 mg via INTRAVENOUS
  Administered 2016-03-10: 25 mg via INTRAVENOUS
  Administered 2016-03-11: 0 mg via INTRAVENOUS
  Administered 2016-03-11: 0.5 mg via INTRAVENOUS

## 2016-03-08 NOTE — ED Notes (Signed)
[phone] Sherron MondayHillary, RN

## 2016-03-08 NOTE — Plan of Care (Signed)
Asked to admit patient for sickle cell pain crisis and uncontrolled pain:  On evaluation patient is sleeping and breathing 8 times a min.  When woken up he does agree that pain is currently reasonably controlled before falling back asleep.  Do not feel that it is indicated or safe to give any additional narcotics to patient at this time.

## 2016-03-08 NOTE — ED Notes (Signed)
ED Provider at bedside. 

## 2016-03-08 NOTE — ED Provider Notes (Signed)
11:13 AM I have seen and evaluated the patient 3 times since signing out this morning. Each time the patient continued to complain of pain in his lower extremities, inconsistently describes pain in the chest. Patient continues on a 2 L nasal cannula. Chart review notable for reassuring blood work given the patient's noted history of sickle cell disease. I discussed this case with our sickle cell consultant. We discussed provision of his home medication, evaluation with CT angiography to exclude clot given the patient's new hypoxia, and transferred to our affiliated Center for further evaluation and management. In the interim, additional blood work ordered to specify diagnosis of sickle cell disease given the absence of prior confirmatory findings available via chart review.    Dean Munchobert Tanekia Ryans, MD 03/08/16 1115

## 2016-03-08 NOTE — H&P (Signed)
Hospital Admission Note Date: 03/08/2016  Patient name: Dean Webb Medical record number: [id number] Date of birth: 1975/03/21 Age: 41 y.o. Gender: male PCP: Billee Cashing, MD  Attending physician: Altha Harm, MD  Chief Complaint: Pain in Chest wall and arms x 1 day  History of Present Illness: This is an opiate tolerant patient with Hb [place]  (confirmed with his PMD- Dr. Ronne Binning) who started having pain in chest wall and arms last night. He took his usual pain medications without any relief and came to the ED seeking care. Upon arrival to the ED his pain has escalated to 10 /10 and was now localized to BUE's, BLE's and  Chest wall He was also Hypoxic in the ED and was ruled out for PR with  CT angiogram. Pt denies any fevers, chills, Vomiting or diarrhea. His last hospitalization for sickle cell crisis was more than 1 year ago.  Pt had an extended stay in the ED and received multiple doses of IV Dilaudid, Toradol, MS IR and Oxycodone. He is still having significant pain and I am asked to admit for sickle cell crisis.  Scheduled Meds: . enoxaparin (LOVENOX) injection  40 mg Subcutaneous Q24H  . HYDROmorphone   Intravenous Q4H  .  HYDROmorphone (DILAUDID) injection  2 mg Intravenous Once  . ketorolac  30 mg Intravenous Q6H  . loratadine  10 mg Oral Daily  . morphine  60 mg Oral Q12H  . senna-docusate  1 tablet Oral BID   Continuous Infusions: . dextrose 5 % and 0.45% NaCl 125 mL/hr at 03/08/16 1026   PRN Meds:.albuterol, diphenhydrAMINE **OR** diphenhydrAMINE (BENADRYL) IVPB(SICKLE CELL ONLY), naloxone **AND** sodium chloride flush, ondansetron (ZOFRAN) IV, polyethylene glycol Allergies: Patient has no known allergies. Past Medical History:  Diagnosis Date  . Asthma   . GERD (gastroesophageal reflux disease)   . Hb-S/Hb-C disease (HCC) 1979  . HTN (hypertension) 2008  . Hypertension   . Sickle cell anemia (HCC)   . Sickle cell disease (HCC)    Past Surgical  History:  Procedure Laterality Date  . CHOLECYSTECTOMY     Family History  Problem Relation Age of Onset  . Sickle cell trait Mother   . Hypertension Mother   . Asthma Mother   . Sickle cell trait Father   . Congestive Heart Failure Father   . Hypertension Sister   . Sickle cell anemia Daughter   . Hypertension Daughter   . Diabetes Daughter    Social History   Social History  . Marital status: Single    Spouse name: N/A  . Number of children: N/A  . Years of education: N/A   Occupational History  . Not on file.   Social History Main Topics  . Smoking status: Current Some Day Smoker    Packs/day: 1.50    Years: 17.00  . Smokeless tobacco: Never Used  . Alcohol use Yes     Comment: Occasional drinker  . Drug use: Yes    Types: Marijuana  . Sexual activity: Yes   Other Topics Concern  . Not on file   Social History Narrative  . No narrative on file   Review of Systems: Pertinent items noted in HPI and remainder of comprehensive ROS otherwise negative. Physical Exam:  Intake/Output Summary (Last 24 hours) at 03/08/16 1814 Last data filed at 03/08/16 0805  Gross per 24 hour  Intake                0 ml  Output  300 ml  Net             -300 ml   General: Alert, awake, oriented x3, in no acute distress.  HEENT: [place]/AT PEERL, EOMI Neck: Trachea midline,  no masses, no thyromegal,y no JVD, no carotid bruit OROPHARYNX:  Moist, No exudate/ erythema/lesions.  Heart: Regular rate and rhythm, without murmurs, rubs, gallops, PMI non-displaced, no heaves or thrills on palpation.  Lungs: Clear to auscultation, no wheezing or rhonchi noted. No increased vocal fremitus resonant to percussion  Abdomen: Soft, nontender, nondistended, positive bowel sounds, no masses no hepatosplenomegaly noted..  Neuro: No focal neurological deficits noted cranial nerves II through XII grossly intact. DTRs 2+ bilaterally upper and lower extremities. Strength 5 out of 5 in bilateral  upper and lower extremities. Musculoskeletal: No warm swelling or erythema around joints, no spinal tenderness noted. Psychiatric: Patient alert and oriented x3, good insight and cognition, good recent to remote recall. Lymph node survey: No cervical axillary or inguinal lymphadenopathy noted.  Lab results:  Recent Labs  03/07/16 1920  NA 138  K 3.5  CL 104  CO2 26  GLUCOSE 102*  BUN <5*  CREATININE 1.29*  CALCIUM 10.7*    Recent Labs  03/07/16 1920  AST 22  ALT 16*  ALKPHOS 79  BILITOT 1.9*  PROT 7.4  ALBUMIN 4.6   No results for input(s): LIPASE, AMYLASE in the last 72 hours.  Recent Labs  03/07/16 1920  WBC 11.6*  NEUTROABS 5.6  HGB 13.8  HCT 37.4*  MCV 78.1  PLT 252   No results for input(s): CKTOTAL, CKMB, CKMBINDEX, TROPONINI in the last 72 hours. Invalid input(s): POCBNP No results for input(s): DDIMER in the last 72 hours. No results for input(s): HGBA1C in the last 72 hours. No results for input(s): CHOL, HDL, LDLCALC, TRIG, CHOLHDL, LDLDIRECT in the last 72 hours. No results for input(s): TSH, T4TOTAL, T3FREE, THYROIDAB in the last 72 hours.  Invalid input(s): FREET3  Recent Labs  03/07/16 1920  RETICCTPCT 3.0   Imaging results:  Dg Chest 2 View  Result Date: 03/07/2016 CLINICAL DATA:  Generalized pain in the mid chest with some shortness of breath EXAM: CHEST  2 VIEW COMPARISON:  08/04/2010 FINDINGS: The heart size and mediastinal contours are within normal limits. Both lungs are clear. The visualized skeletal structures are unremarkable. IMPRESSION: No active cardiopulmonary disease. Electronically Signed   By: Kennith Center M.D.   On: 03/07/2016 20:49   Ct Angio Chest Pe W/cm &/or Wo Cm  Result Date: 03/08/2016 CLINICAL DATA:  Chest pain, shortness of Breath starting yesterday, history of sickle cell disease, possible pulmonary embolus EXAM: CT ANGIOGRAPHY CHEST WITH CONTRAST TECHNIQUE: Multidetector CT imaging of the chest was performed  using the standard protocol during bolus administration of intravenous contrast. Multiplanar CT image reconstructions and MIPs were obtained to evaluate the vascular anatomy. CONTRAST:  100 cc Isovue COMPARISON:  Images of the lung bases CT scan 11/07/2008 FINDINGS: Cardiovascular: Heart size within normal limits. No pericardial effusion. No aortic aneurysm. No pulmonary embolus is noted Mediastinum/Nodes: No mediastinal hematoma or adenopathy. There is diffuse thickening of esophageal wall. Findings are highly suspicious for gastroesophageal reflux disease. Clinical correlation is necessary. No hilar adenopathy is noted. Central airways are patent Lungs/Pleura: Images of the lung parenchyma shows no infiltrate or pulmonary edema. Stable linear atelectasis or scarring in right lower lobe. No infarcted lung. No pneumothorax. No emphysema. No bronchiectasis. Upper Abdomen: Visualized upper abdomen shows no adrenal gland mass. The  patient is status post cholecystectomy. The spleen is smaller in size as compared with prior exam measures 7.9 cm lying. The prior exam measured 13.2 cm in length. Mild lobulated splenic contour. Musculoskeletal: No destructive bony lesions are noted. Sagittal images of the spine shows mild degenerative changes mid and lower thoracic spine. Review of the MIP images confirms the above findings. IMPRESSION: 1. No pulmonary embolus is noted. 2. No acute infiltrate or pulmonary edema. Stable linear scarring or atelectasis in right lower lobe. 3. There is diffuse thickening of esophageal wall. This is highly suspicious for gastroesophageal reflux disease. Clinical correlation and further correlation with endoscopy is recommended. 4. There is interval splenic atrophy with spleen measuring about 8 cm in length. There is mild lobulated splenic contour. 5. Status post cholecystectomy. Electronically Signed   By: Natasha MeadLiviu  Pop M.D.   On: 03/08/2016 11:56   Other results: EKG: normal EKG, normal sinus  rhythm, unchanged from previous tracings, normal sinus rhythm.   Assessment and Plan: 1. Hb [place] with Crisis: Start on PCA, Toradol and hypotonic IVF. Schedule clincian assisted doses every 3 hours. Re-assess pain tomorrow.  2. HTN: Pt is unsure of his BP medications. He is not sure of the medications that he is taking. I will give a dose of Chlorthalidone as he believes this to be the medication he takes at home as is a reasonable choice. However will try to verify medications when his Physicians office opens tomorrow.   3. Intermittent Asthma: Continue Albuterol as needed. 4. Suspected OSA: Pt reports that he is known to have apnea during sleep and has requested an evaluation from his Physician but thus far has not received one. Will order titratable CPAP as I am observing Apneic episodes as I am sitting here and patient is sleeping. Oxygen support to keep saturations >95%.  5. GERD: Continue Protonix as formulary substitute for Omeprazole. 6. DVT Prophylaxis: Lovenox.   Time  Spent during this visit 75 minutes.   Rodolfo Notaro A. 03/08/2016, 6:14 PM

## 2016-03-08 NOTE — ED Notes (Signed)
Pt is on 4L of oxygen

## 2016-03-08 NOTE — ED Notes (Signed)
Pt placed on 3L [place] sats low 90's.

## 2016-03-08 NOTE — ED Notes (Signed)
Per Preston FleetingGlick MD hold on Dilaudid since pt O2 droppped and had to place [id number].

## 2016-03-08 NOTE — ED Notes (Signed)
Pt resp rate is still 10 at this time, will hold pain medicine.

## 2016-03-08 NOTE — ED Notes (Signed)
Pt reduced to 2L of O2. Pt's oxygen saturation has stayed WNL

## 2016-03-08 NOTE — ED Notes (Signed)
[employer] breakfast tray ordered 

## 2016-03-08 NOTE — ED Notes (Signed)
Pt breathing 8 times per minute. Pt is stable and oxygen saturation is WNL. Pt is currently sleeping.

## 2016-03-08 NOTE — ED Notes (Signed)
Dr. Gardner at bedside 

## 2016-03-08 NOTE — ED Notes (Signed)
Meal tray ordered for lunch

## 2016-03-09 LAB — CBC WITH DIFFERENTIAL/PLATELET
BASOS ABS: 0 10*3/uL (ref 0.0–0.1)
Basophils Relative: 0 %
Eosinophils Absolute: 0.3 10*3/uL (ref 0.0–0.7)
Eosinophils Relative: 3 %
HEMATOCRIT: 37.1 % — AB (ref 39.0–52.0)
Hemoglobin: 13.6 g/dL (ref 13.0–17.0)
LYMPHS ABS: 1.8 10*3/uL (ref 0.7–4.0)
LYMPHS PCT: 17 %
MCH: 28.5 pg (ref 26.0–34.0)
MCHC: 36.7 g/dL — AB (ref 30.0–36.0)
MCV: 77.8 fL — AB (ref 78.0–100.0)
MONO ABS: 1.4 10*3/uL — AB (ref 0.1–1.0)
MONOS PCT: 13 %
NEUTROS ABS: 7.1 10*3/uL (ref 1.7–7.7)
Neutrophils Relative %: 67 %
PLATELETS: 228 10*3/uL (ref 150–400)
RBC: 4.77 MIL/uL (ref 4.22–5.81)
RDW: 16.8 % — AB (ref 11.5–15.5)
WBC: 10.6 10*3/uL — ABNORMAL HIGH (ref 4.0–10.5)

## 2016-03-09 LAB — BASIC METABOLIC PANEL
ANION GAP: 4 — AB (ref 5–15)
BUN: 7 mg/dL (ref 6–20)
CALCIUM: 10 mg/dL (ref 8.9–10.3)
CO2: 31 mmol/L (ref 22–32)
CREATININE: 1.42 mg/dL — AB (ref 0.61–1.24)
Chloride: 102 mmol/L (ref 101–111)
GFR calc Af Amer: >60 mL/min (ref >60)
GLUCOSE: 120 mg/dL — AB (ref 65–99)
Potassium: 3.9 mmol/L (ref 3.5–5.1)
Sodium: 137 mmol/L (ref 135–145)

## 2016-03-09 MED ORDER — PROCHLORPERAZINE MALEATE 10 MG PO TABS
10.0000 mg | ORAL_TABLET | Freq: Once | ORAL | Status: AC
Start: 2016-03-09 — End: 2016-03-09
  Administered 2016-03-09: 10 mg via ORAL
  Filled 2016-03-09: qty 1

## 2016-03-09 MED ORDER — VITAMINS A & D EX OINT
TOPICAL_OINTMENT | CUTANEOUS | Status: AC
  Administered 2016-03-09: 23:00:00
  Filled 2016-03-09: qty 10

## 2016-03-09 NOTE — Progress Notes (Signed)
Patient found already wearing CPAP.  Patient was tolerating well.

## 2016-03-09 NOTE — Progress Notes (Signed)
Per MD order, placed pt on CPAP.  10cm H2O, 2L O2 bleed in.

## 2016-03-09 NOTE — Progress Notes (Addendum)
SICKLE CELL SERVICE PROGRESS NOTE  Dean Webb ZOX:[id number] DOB: 11-07-1975 DOA: 03/07/2016 PCP: Dean Cashing, MD  Assessment/Plan: Active Problems:   Chest pain   Hb-S/hb-C disease with crisis (HCC)  1. Hb [place] with crisis:  2. Leukocytosis: Mild Leukocytosis and no evidence of infection. 3. Anemia of Chronic Disease:  4. Chronic pain: Continue MS Contin.  5. Suspected Obstructive Sleep Apnea: Titratable CPAP ordered for patent but did not wear it last night.  6. HTN: Pt's BP normal this morning. Will hold on any medications. His Physician's office is not open this morning and Provider Portal does not list and antihypertensive medications but does have an unknown medication listed.  Will hold BP medications for now.   Code Status: Full Code Family Communication: N/A Disposition Plan: Not yet ready for discharge  Dean Webb A.  Pager [phone]. If 7PM-7AM, please contact night-coverage.  03/09/2016, 11:28 AM  LOS: 1 day   Admitting History: Pt has used PCA only minimally stating that he was unsure if he could use the PCA while he was taking his MS Contin. He has used only 1.5 mg with 3/3:demands/deliveries since admission. He rates his pain 8/10 and localized to his arms and legs. Chest wall pain has decreased to 6/10. He appears to have some mild cognitive deficits however difficult to evaluate during sick state.   Consultants:  None  Procedures:  None  Antibiotics:  None   Objective: Vitals:   03/09/16 0348 03/09/16 0526 03/09/16 0612 03/09/16 0800  BP:   122/76   Pulse:   (!) 56   Resp: 11  20 12   Temp:   98.1 F (36.7 C)   TempSrc:   Oral   SpO2: 100% 99% 98% 99%  Weight:      Height:       Weight change:   Intake/Output Summary (Last 24 hours) at 03/09/16 1128 Last data filed at 03/09/16 1122  Gross per 24 hour  Intake          1392.17 ml  Output             1250 ml  Net           142.17 ml    General: Alert, awake, oriented x3, in no  acute distress.  HEENT: [place]/AT PEERL, EOMI, anicteric Neck: Trachea midline,  no masses, no thyromegal,y no JVD, no carotid bruit OROPHARYNX:  Moist, No exudate/ erythema/lesions.  Heart: Regular rate and rhythm, without murmurs, rubs, gallops, PMI non-displaced, no heaves or thrills on palpation.  Lungs: Clear to auscultation, no wheezing or rhonchi noted. No increased vocal fremitus resonant to percussion  Abdomen: Soft, nontender, nondistended, positive bowel sounds, no masses no hepatosplenomegaly noted.  Neuro: No focal neurological deficits noted cranial nerves II through XII grossly intact.  Strength at baselinein bilateral upper and lower extremities. Musculoskeletal: No warmth swelling or erythema around joints, no spinal tenderness noted. Psychiatric: Patient alert and oriented x3, good insight and cognition, good recent to remote recall.    Data Reviewed: Basic Metabolic Panel:  Recent Labs Lab 03/07/16 1920  NA 138  K 3.5  CL 104  CO2 26  GLUCOSE 102*  BUN <5*  CREATININE 1.29*  CALCIUM 10.7*   Liver Function Tests:  Recent Labs Lab 03/07/16 1920  AST 22  ALT 16*  ALKPHOS 79  BILITOT 1.9*  PROT 7.4  ALBUMIN 4.6   No results for input(s): LIPASE, AMYLASE in the last 168 hours. No results for input(s): AMMONIA in the  last 168 hours. CBC:  Recent Labs Lab 03/07/16 1920  WBC 11.6*  NEUTROABS 5.6  HGB 13.8  HCT 37.4*  MCV 78.1  PLT 252   Cardiac Enzymes: No results for input(s): CKTOTAL, CKMB, CKMBINDEX, TROPONINI in the last 168 hours. BNP (last 3 results) No results for input(s): BNP in the last 8760 hours.  ProBNP (last 3 results) No results for input(s): PROBNP in the last 8760 hours.  CBG: No results for input(s): GLUCAP in the last 168 hours.  No results found for this or any previous visit (from the past 240 hour(s)).   Studies: Dg Chest 2 View  Result Date: 03/07/2016 CLINICAL DATA:  Generalized pain in the mid chest with some  shortness of breath EXAM: CHEST  2 VIEW COMPARISON:  08/04/2010 FINDINGS: The heart size and mediastinal contours are within normal limits. Both lungs are clear. The visualized skeletal structures are unremarkable. IMPRESSION: No active cardiopulmonary disease. Electronically Signed   By: Kennith CenterEric  Mansell M.D.   On: 03/07/2016 20:49   Ct Angio Chest Pe W/cm &/or Wo Cm  Result Date: 03/08/2016 CLINICAL DATA:  Chest pain, shortness of Breath starting yesterday, history of sickle cell disease, possible pulmonary embolus EXAM: CT ANGIOGRAPHY CHEST WITH CONTRAST TECHNIQUE: Multidetector CT imaging of the chest was performed using the standard protocol during bolus administration of intravenous contrast. Multiplanar CT image reconstructions and MIPs were obtained to evaluate the vascular anatomy. CONTRAST:  100 cc Isovue COMPARISON:  Images of the lung bases CT scan 11/07/2008 FINDINGS: Cardiovascular: Heart size within normal limits. No pericardial effusion. No aortic aneurysm. No pulmonary embolus is noted Mediastinum/Nodes: No mediastinal hematoma or adenopathy. There is diffuse thickening of esophageal wall. Findings are highly suspicious for gastroesophageal reflux disease. Clinical correlation is necessary. No hilar adenopathy is noted. Central airways are patent Lungs/Pleura: Images of the lung parenchyma shows no infiltrate or pulmonary edema. Stable linear atelectasis or scarring in right lower lobe. No infarcted lung. No pneumothorax. No emphysema. No bronchiectasis. Upper Abdomen: Visualized upper abdomen shows no adrenal gland mass. The patient is status post cholecystectomy. The spleen is smaller in size as compared with prior exam measures 7.9 cm lying. The prior exam measured 13.2 cm in length. Mild lobulated splenic contour. Musculoskeletal: No destructive bony lesions are noted. Sagittal images of the spine shows mild degenerative changes mid and lower thoracic spine. Review of the MIP images confirms  the above findings. IMPRESSION: 1. No pulmonary embolus is noted. 2. No acute infiltrate or pulmonary edema. Stable linear scarring or atelectasis in right lower lobe. 3. There is diffuse thickening of esophageal wall. This is highly suspicious for gastroesophageal reflux disease. Clinical correlation and further correlation with endoscopy is recommended. 4. There is interval splenic atrophy with spleen measuring about 8 cm in length. There is mild lobulated splenic contour. 5. Status post cholecystectomy. Electronically Signed   By: Natasha MeadLiviu  Pop M.D.   On: 03/08/2016 11:56    Scheduled Meds: . enoxaparin (LOVENOX) injection  40 mg Subcutaneous Q24H  . HYDROmorphone   Intravenous Q4H  .  HYDROmorphone (DILAUDID) injection  1 mg Intravenous Q3H  . ketorolac  30 mg Intravenous Q6H  . loratadine  10 mg Oral Daily  . morphine  60 mg Oral Q12H  . pantoprazole  40 mg Oral Daily  . senna-docusate  1 tablet Oral BID   Continuous Infusions: . dextrose 5 % and 0.45% NaCl 125 mL/hr at 03/09/16 1057    Active Problems:   Chest pain  Hb-S/hb-C disease with crisis (HCC)    In excess 50% involved face to face contact with the patient for assessment, counseling and coordination of care.    Addendum:  1:37 pm  Spoke with Dr. Ronne Binning- Patients PMD and he reports that patient not currently on antihypertensive medications but had been on Amlodopine and Benazipril in the last. The last prescription was in April of 2016.   Plan: Will hold on any antihypertensive medications.   Amous Crewe A.

## 2016-03-09 NOTE — Plan of Care (Signed)
Problem: Pain Managment: Goal: General experience of comfort will improve Pt asleep but easy to awaken.  Oriented x4.  States pain is 8/10 when awake.  Hardly using PCA.  Getting 1mg  Dilaudid push every 3 hours.  RR 6-12.  Scheduled dose of Dilaudid verified with Dr. Ashley RoyaltyMatthews.  Pt with c/o nausea and emesis x2.  Zofran given once, then one time dose of Compazine given with relief. BP 122/76 (BP Location: Left Arm)   Pulse (!) 56   Temp 98.1 F (36.7 C) (Oral)   Resp 10   Ht 5\' 7"  (1.702 m)   Wt 89.4 kg (197 lb)   SpO2 100%   BMI 30.85 kg/m .  Continue with plan of care and continue to monitor.

## 2016-03-10 DIAGNOSIS — K5900 Constipation, unspecified: Secondary | ICD-10-CM

## 2016-03-10 DIAGNOSIS — G894 Chronic pain syndrome: Secondary | ICD-10-CM

## 2016-03-10 DIAGNOSIS — G4733 Obstructive sleep apnea (adult) (pediatric): Secondary | ICD-10-CM

## 2016-03-10 DIAGNOSIS — Z79891 Long term (current) use of opiate analgesic: Secondary | ICD-10-CM

## 2016-03-10 LAB — HEMOGLOBINOPATHY EVALUATION
HGB A: 0 % — AB (ref 96.4–98.8)
HGB C: 45.7 % — AB
Hgb A2 Quant: 4.2 % — ABNORMAL HIGH (ref 1.8–3.2)
Hgb F Quant: 7.5 % — ABNORMAL HIGH (ref 0.0–2.0)
Hgb S Quant: 42.6 % — ABNORMAL HIGH
Hgb Variant: 0 %

## 2016-03-10 MED ORDER — LIP MEDEX EX OINT
TOPICAL_OINTMENT | CUTANEOUS | Status: AC
  Administered 2016-03-10: 1
  Filled 2016-03-10: qty 7

## 2016-03-10 MED ORDER — OXYCODONE HCL 5 MG PO TABS
60.0000 mg | ORAL_TABLET | Freq: Four times a day (QID) | ORAL | Status: DC
  Administered 2016-03-10 – 2016-03-11 (×3): 60 mg via ORAL
  Filled 2016-03-10 (×5): qty 12

## 2016-03-10 MED ORDER — DEXTROSE-NACL 5-0.45 % IV SOLN
INTRAVENOUS | Status: DC
  Administered 2016-03-10: 11:00:00 via INTRAVENOUS

## 2016-03-10 NOTE — Progress Notes (Signed)
SICKLE CELL SERVICE PROGRESS NOTE  Dean Webb ZOX:[id number] DOB: December 13, 1975 DOA: 03/07/2016 PCP: Billee Cashing, MD  Assessment/Plan: Active Problems:   Chest pain   Hb-S/hb-C disease with crisis (HCC)  1. Hb [place] with crisis: Continue PCA for prn use, schedule oxycodone and continue MS-Contin and Toradol. Decrease IVF to [facility]. 2. Leukocytosis: Mild Leukocytosis and no evidence of infection. 3. Chronic pain: Continue MS Contin.  4. Suspected Obstructive Sleep Apnea: Titratable CPAP applied and pt states that he has already noted the difference in his level of restfulness and level of alertness tyoday. 5. HTN: Pt's BP normal this morning. Will hold on any medications. His Physician's office is not open this morning and Provider Portal does not list and antihypertensive medications but does have an unknown medication listed.  Will hold BP medications for now.  6. Constipation: Nurse to give Miralax.  Code Status: Full Code Family Communication: N/A Disposition Plan: Anticipate discharge home tomorrow.  MATTHEWS,MICHELLE A.  Pager [phone]. If 7PM-7AM, please contact night-coverage.  03/10/2016, 4:13 PM  LOS: 2 days   Admitting History: Pt has used PCA only minimally 1.25 mg in 24 hours. Currently pain is 7/10 and localized to legs and arms. No BM since admission.  Consultants:  None  Procedures:  None  Antibiotics:  None   Objective: Vitals:   03/10/16 0800 03/10/16 0916 03/10/16 1338 03/10/16 1532  BP:  (!) 142/75 (!) 140/99   Pulse:  63 (!) 56   Resp: 15 12 14 12   Temp:  98.5 F (36.9 C)    TempSrc:  Oral    SpO2: 92% 96% 95% 97%  Weight:      Height:       Weight change:   Intake/Output Summary (Last 24 hours) at 03/10/16 1613 Last data filed at 03/10/16 1500  Gross per 24 hour  Intake          3044.17 ml  Output             1600 ml  Net          1444.17 ml    General: Alert, awake, oriented x3, in mild distress.  HEENT: [place]/AT [place]ER[place], EOMI,  anicteric Neck: Trachea midline,  no masses, no thyromegal,y no JVD, no carotid bruit OROPHARYNX:  Moist, No exudate/ erythema/lesions.  Heart: Regular rate and rhythm, without murmurs, rubs, gallops, PMI non-displaced, no heaves or thrills on palpation.  Lungs: Clear to auscultation, no wheezing or rhonchi noted. No increased vocal fremitus resonant to percussion  Abdomen: Soft, nontender, nondistended, positive bowel sounds, no masses no hepatosplenomegaly noted.  Neuro: No focal neurological deficits noted cranial nerves II through XII grossly intact.  Strength at baselinein bilateral upper and lower extremities. Musculoskeletal: No warmth swelling or erythema around joints, no spinal tenderness noted. Psychiatric: Patient alert and oriented x3, good insight and cognition, good recent to remote recall.    Data Reviewed: Basic Metabolic Panel:  Recent Labs Lab 03/07/16 1920 03/09/16 1121  NA 138 137  K 3.5 3.9  CL 104 102  CO2 26 31  GLUCOSE 102* 120*  BUN <5* 7  CREATININE 1.29* 1.42*  CALCIUM 10.7* 10.0   Liver Function Tests:  Recent Labs Lab 03/07/16 1920  AST 22  ALT 16*  ALKPHOS 79  BILITOT 1.9*  PROT 7.4  ALBUMIN 4.6   No results for input(s): LIPASE, AMYLASE in the last 168 hours. No results for input(s): AMMONIA in the last 168 hours. CBC:  Recent Labs Lab 03/07/16 1920  03/09/16 1121  WBC 11.6* 10.6*  NEUTROABS 5.6 7.1  HGB 13.8 13.6  HCT 37.4* 37.1*  MCV 78.1 77.8*  PLT 252 228   Cardiac Enzymes: No results for input(s): CKTOTAL, CKMB, CKMBINDEX, TROPONINI in the last 168 hours. BNP (last 3 results) No results for input(s): BNP in the last 8760 hours.  ProBNP (last 3 results) No results for input(s): PROBNP in the last 8760 hours.  CBG: No results for input(s): GLUCAP in the last 168 hours.  No results found for this or any previous visit (from the past 240 hour(s)).   Studies: Dg Chest 2 View  Result Date: 03/07/2016 CLINICAL DATA:   Generalized pain in the mid chest with some shortness of breath EXAM: CHEST  2 VIEW COMPARISON:  08/04/2010 FINDINGS: The heart size and mediastinal contours are within normal limits. Both lungs are clear. The visualized skeletal structures are unremarkable. IMPRESSION: No active cardiopulmonary disease. Electronically Signed   By: Kennith Center M.D.   On: 03/07/2016 20:49   Ct Angio Chest Pe W/cm &/or Wo Cm  Result Date: 03/08/2016 CLINICAL DATA:  Chest pain, shortness of Breath starting yesterday, history of sickle cell disease, possible pulmonary embolus EXAM: CT ANGIOGRAPHY CHEST WITH CONTRAST TECHNIQUE: Multidetector CT imaging of the chest was performed using the standard protocol during bolus administration of intravenous contrast. Multiplanar CT image reconstructions and MIPs were obtained to evaluate the vascular anatomy. CONTRAST:  100 cc Isovue COMPARISON:  Images of the lung bases CT scan 11/07/2008 FINDINGS: Cardiovascular: Heart size within normal limits. No pericardial effusion. No aortic aneurysm. No pulmonary embolus is noted Mediastinum/Nodes: No mediastinal hematoma or adenopathy. There is diffuse thickening of esophageal wall. Findings are highly suspicious for gastroesophageal reflux disease. Clinical correlation is necessary. No hilar adenopathy is noted. Central airways are patent Lungs/Pleura: Images of the lung parenchyma shows no infiltrate or pulmonary edema. Stable linear atelectasis or scarring in right lower lobe. No infarcted lung. No pneumothorax. No emphysema. No bronchiectasis. Upper Abdomen: Visualized upper abdomen shows no adrenal gland mass. The patient is status post cholecystectomy. The spleen is smaller in size as compared with prior exam measures 7.9 cm lying. The prior exam measured 13.2 cm in length. Mild lobulated splenic contour. Musculoskeletal: No destructive bony lesions are noted. Sagittal images of the spine shows mild degenerative changes mid and lower thoracic  spine. Review of the MIP images confirms the above findings. IMPRESSION: 1. No pulmonary embolus is noted. 2. No acute infiltrate or pulmonary edema. Stable linear scarring or atelectasis in right lower lobe. 3. There is diffuse thickening of esophageal wall. This is highly suspicious for gastroesophageal reflux disease. Clinical correlation and further correlation with endoscopy is recommended. 4. There is interval splenic atrophy with spleen measuring about 8 cm in length. There is mild lobulated splenic contour. 5. Status post cholecystectomy. Electronically Signed   By: Natasha Mead M.D.   On: 03/08/2016 11:56    Scheduled Meds: . enoxaparin (LOVENOX) injection  40 mg Subcutaneous Q24H  . HYDROmorphone   Intravenous Q4H  . ketorolac  30 mg Intravenous Q6H  . loratadine  10 mg Oral Daily  . morphine  60 mg Oral Q12H  . oxyCODONE  60 mg Oral Q6H  . pantoprazole  40 mg Oral Daily  . senna-docusate  1 tablet Oral BID   Continuous Infusions: . dextrose 5 % and 0.45% NaCl 125 mL/hr at 03/10/16 1034    Active Problems:   Chest pain   Hb-S/hb-C disease with crisis (  HCC)    In excess 50% involved face to face contact with the patient for assessment, counseling and coordination of care.     MATTHEWS,MICHELLE A.

## 2016-03-11 DIAGNOSIS — I10 Essential (primary) hypertension: Secondary | ICD-10-CM | POA: Diagnosis not present

## 2016-03-11 DIAGNOSIS — Z79891 Long term (current) use of opiate analgesic: Secondary | ICD-10-CM

## 2016-03-11 DIAGNOSIS — R42 Dizziness and giddiness: Secondary | ICD-10-CM

## 2016-03-11 LAB — CBC WITH DIFFERENTIAL/PLATELET
Basophils Absolute: 0.1 10*3/uL (ref 0.0–0.1)
Basophils Relative: 0 %
Eosinophils Absolute: 0.4 10*3/uL (ref 0.0–0.7)
Eosinophils Relative: 3 %
HEMATOCRIT: 38.2 % — AB (ref 39.0–52.0)
HEMOGLOBIN: 14.4 g/dL (ref 13.0–17.0)
LYMPHS ABS: 1.9 10*3/uL (ref 0.7–4.0)
Lymphocytes Relative: 11 %
MCH: 29.4 pg (ref 26.0–34.0)
MCHC: 37.7 g/dL — ABNORMAL HIGH (ref 30.0–36.0)
MCV: 78 fL (ref 78.0–100.0)
Monocytes Absolute: 1.7 10*3/uL — ABNORMAL HIGH (ref 0.1–1.0)
Monocytes Relative: 10 %
NEUTROS PCT: 76 %
Neutro Abs: 13 10*3/uL — ABNORMAL HIGH (ref 1.7–7.7)
Platelets: 227 10*3/uL (ref 150–400)
RBC: 4.9 MIL/uL (ref 4.22–5.81)
RDW: 16.9 % — ABNORMAL HIGH (ref 11.5–15.5)
WBC: 17 10*3/uL — ABNORMAL HIGH (ref 4.0–10.5)

## 2016-03-11 LAB — BASIC METABOLIC PANEL
Anion gap: 6 (ref 5–15)
BUN: 9 mg/dL (ref 6–20)
CHLORIDE: 104 mmol/L (ref 101–111)
CO2: 27 mmol/L (ref 22–32)
Calcium: 10.3 mg/dL (ref 8.9–10.3)
Creatinine, Ser: 1.36 mg/dL — ABNORMAL HIGH (ref 0.61–1.24)
GFR calc non Af Amer: >60 mL/min (ref >60)
Glucose, Bld: 112 mg/dL — ABNORMAL HIGH (ref 65–99)
POTASSIUM: 4.2 mmol/L (ref 3.5–5.1)
SODIUM: 137 mmol/L (ref 135–145)

## 2016-03-11 MED ORDER — AMLODIPINE BESYLATE 10 MG PO TABS
10.0000 mg | ORAL_TABLET | Freq: Every day | ORAL | Status: DC
  Administered 2016-03-11: 10 mg via ORAL
  Filled 2016-03-11: qty 1

## 2016-03-11 MED ORDER — CHLORTHALIDONE 25 MG PO TABS
25.0000 mg | ORAL_TABLET | Freq: Every day | ORAL | Status: DC
  Filled 2016-03-11: qty 1

## 2016-03-11 MED ORDER — CLONIDINE HCL 0.1 MG PO TABS
0.1000 mg | ORAL_TABLET | Freq: Once | ORAL | Status: AC
  Administered 2016-03-11: 0.1 mg via ORAL
  Filled 2016-03-11: qty 1

## 2016-03-11 NOTE — Progress Notes (Signed)
Patient ambulated in hallway oxygen level remain 98-100% on room air. Heart rate remained in the 90's.

## 2016-03-11 NOTE — Progress Notes (Signed)
PCA hydromorphone 18mg  Wasted by Clinical research associatewriter and witnessed by M.D.C. Holdingsaylor Council, RN

## 2016-03-11 NOTE — Discharge Summary (Signed)
Dean Webb MRN: [id number] DOB/AGE: Aug 02, 1975 41 y.o.  Admit date: 03/07/2016 Discharge date: 03/11/2016  Primary Care Physician:  Dean Cashing, MD Recommendations: 1. Referral for Sleep study for suspected Obstructed Sleep Apnea. 2. Office visit within 3 days of discharge to address accelerated HTN issues and medication management as it it is unclear what antihypertensive medications the patient is currently taking. Furthermore the Pharmacy that patient provided as his current Pharmacy has no record of current medications being dispensed. Also [facility] has no record of antihypertensive medications.   Discharge Diagnoses:   Patient Active Problem List   Diagnosis Date Noted  . Accelerated hypertension 03/11/2016  . Dizziness 03/11/2016  . Chronic prescription opiate use 03/11/2016  . Hb-S/hb-C disease with crisis (HCC) 03/08/2016  . Sickle cell disease (HCC)     DISCHARGE MEDICATION:  Pt left AMA so Med-Rec not completed.    Consults:    SIGNIFICANT DIAGNOSTIC STUDIES:  Dg Chest 2 View  Result Date: 03/07/2016 CLINICAL DATA:  Generalized pain in the mid chest with some shortness of breath EXAM: CHEST  2 VIEW COMPARISON:  08/04/2010 FINDINGS: The heart size and mediastinal contours are within normal limits. Both lungs are clear. The visualized skeletal structures are unremarkable. IMPRESSION: No active cardiopulmonary disease. Electronically Signed   By: Dean Webb M.D.   On: 03/07/2016 20:49   Ct Angio Chest Pe W/cm &/or Wo Cm  Result Date: 03/08/2016 CLINICAL DATA:  Chest pain, shortness of Breath starting yesterday, history of sickle cell disease, possible pulmonary embolus EXAM: CT ANGIOGRAPHY CHEST WITH CONTRAST TECHNIQUE: Multidetector CT imaging of the chest was performed using the standard protocol during bolus administration of intravenous contrast. Multiplanar CT image reconstructions and MIPs were obtained to evaluate the vascular anatomy.  CONTRAST:  100 cc Isovue COMPARISON:  Images of the lung bases CT scan 11/07/2008 FINDINGS: Cardiovascular: Heart size within normal limits. No pericardial effusion. No aortic aneurysm. No pulmonary embolus is noted Mediastinum/Nodes: No mediastinal hematoma or adenopathy. There is diffuse thickening of esophageal wall. Findings are highly suspicious for gastroesophageal reflux disease. Clinical correlation is necessary. No hilar adenopathy is noted. Central airways are patent Lungs/Pleura: Images of the lung parenchyma shows no infiltrate or pulmonary edema. Stable linear atelectasis or scarring in right lower lobe. No infarcted lung. No pneumothorax. No emphysema. No bronchiectasis. Upper Abdomen: Visualized upper abdomen shows no adrenal gland mass. The patient is status post cholecystectomy. The spleen is smaller in size as compared with prior exam measures 7.9 cm lying. The prior exam measured 13.2 cm in length. Mild lobulated splenic contour. Musculoskeletal: No destructive bony lesions are noted. Sagittal images of the spine shows mild degenerative changes mid and lower thoracic spine. Review of the MIP images confirms the above findings. IMPRESSION: 1. No pulmonary embolus is noted. 2. No acute infiltrate or pulmonary edema. Stable linear scarring or atelectasis in right lower lobe. 3. There is diffuse thickening of esophageal wall. This is highly suspicious for gastroesophageal reflux disease. Clinical correlation and further correlation with endoscopy is recommended. 4. There is interval splenic atrophy with spleen measuring about 8 cm in length. There is mild lobulated splenic contour. 5. Status post cholecystectomy. Electronically Signed   By: Dean Webb M.D.   On: 03/08/2016 11:56       No results found for this or any previous visit (from the past 240 hour(s)).  BRIEF ADMITTING H & P: This is an opiate tolerant patient with Hb [place]  (confirmed with his PMD- Dr.  McKenzie) who started having pain  in chest wall and arms last night. He took his usual pain medications without any relief and came to the ED seeking care. Upon arrival to the ED his pain has escalated to 10 /10 and was now localized to BUE's, BLE's and  Chest wall He was also Hypoxic in the ED and was ruled out for PR with  CT angiogram. Pt denies any fevers, chills, Vomiting or diarrhea. His last hospitalization for sickle cell crisis was more than 1 year ago.  Pt had an extended stay in the ED and received multiple doses of IV Dilaudid, Toradol, MS IR and Oxycodone. He is still having significant pain and I am asked to admit for sickle cell crisis.   Hospital Course:  Present on Admission: . (Resolved) Chest pain . Hb-S/hb-C disease with crisis (HCC)  Pt was admitted for sickle cell crisis and treated with Dilaudid via PCA, Toradol and IVF and  MS Contin was continued for management of his chronic pain. He reported that he had HTN but did not know what his BP medications were and where he had them filled. I spoke with his PMD-Dr. Ronne Webb who reported that he was not currently on antihypertensive medications and that he (Dr. Ronne Webb) had not written a prescription for the patient for antihypertensive medications since April of 2016. Pt called [facility] and Nurse also called and they report that the patient has not had antihypertensive medications filled since October of 2016. I have explained that these cannot be relied upon as current medications as they are not current prescriptions. He states that he has the prescriptions at home but he is unable to obtain them at present. Pt was given Norvasc and Clonidine. He is refusing any further stating that this is not what he takes at home yet he is unable to provide the information on what he takes at home. His BP is still significantly elevated at 173/120 and  is refusing further treatment. His risk of the markedly elevated BP has been explained to patient and his fiance Dean Webb up to and  including the risk of  stroke and death. Pt verbalizes understanding but  he has decided to leave AMA. Both patient and fiance express frustration that the Hospital does not have a record of his home medications as he has been a patient here before.Please note that the last visit to the ED was on 09/01/2015 and at that time there were no antihypertensive medications listed on his home medication list. I have advised patient to keep a list of his mediations and doses available on him at all times.    Disposition and Follow-up:  Pt leaving AMA.   DISCHARGE EXAM: None performed. Pt leaving AMA.   Blood pressure (!) 173/120, pulse 98, temperature 98.8 F (37.1 C), temperature source Oral, resp. rate 10, height 5\' 7"  (1.702 m), weight 89.4 kg (197 lb), SpO2 96 %.   Recent Labs  03/09/16 1121  NA 137  K 3.9  CL 102  CO2 31  GLUCOSE 120*  BUN 7  CREATININE 1.42*  CALCIUM 10.0   No results for input(s): AST, ALT, ALKPHOS, BILITOT, PROT, ALBUMIN in the last 72 hours. No results for input(s): LIPASE, AMYLASE in the last 72 hours.  Recent Labs  03/09/16 1121 03/11/16 1149  WBC 10.6* 17.0*  NEUTROABS 7.1 13.0*  HGB 13.6 14.4  HCT 37.1* 38.2*  MCV 77.8* 78.0  PLT 228 227     Total time spent including  face to face and decision making was greater than 30 minutes  Signed: Mckay Brandt A. 03/11/2016, 1:49 PM

## 2016-03-11 NOTE — Progress Notes (Signed)
Patient BP elevated notified Dr. Ashley RoyaltyMatthews  And new orders received. Patient refusing new order of [place], states it's not his normal BP medication. Notified [facility]  to verify  patient BP medications, pharmacy states  patient takes Lotensin 20 mg and Norvasc 10 mg daily, but medications has not been filled by them 11/18/14.  Patient appears upset that he is not going home today. Explained to patient that his blood pressure is elevated and it would not be safe for to go home today,  also explained that insurance will note pay for your hospital stay if you leave against medical advice and the risk for other health issues are  Increased. Patient verbalizes understanding Dr. Ashley RoyaltyMatthews notified of situation.  AMA paperwork signed and placed on chart.

## 2016-03-11 NOTE — Progress Notes (Signed)
SICKLE CELL SERVICE PROGRESS NOTE  Dean Webb ZOX:[id number] DOB: 10-Jan-1976 DOA: 03/07/2016 PCP: Billee Cashing, MD  Assessment/Plan: Active Problems:   Chest pain   Hb-S/hb-C disease with crisis (HCC)  1. Hb [place] with crisis: Continue PCA for prn use, schedule oxycodone and continue MS-Contin and Toradol. Decrease IVF to [facility]. 2. Dizziness: Pt c/o dizziness with standing and walking. Orthostatics negative. In a setting of the elevated BP I consider this end-organ threat. Will treat BP urgently and if dizziness persists, beyond decrease in BP will obtain MRI/MRA.  3. Elevated BP: BP markedly elevated this morning is a setting of dizziness. Will give Clonidine 0.1mg  and start Norvasc 10 mg daily.  4. Leukocytosis: Mild Leukocytosis and no evidence of infection. 5. Chronic pain: Continue MS Contin.  6. Suspected Obstructive Sleep Apnea: Titratable CPAP applied and pt states that he has already noted the difference in his level of restfulness and level of alertness tyoday. 7. Constipation: Nurse to give Miralax.  Code Status: Full Code Family Communication: N/A Disposition Plan: Anticipate discharge home tomorrow.  Dean Webb A.  Pager [phone]. If 7PM-7AM, please contact night-coverage.  03/11/2016, 11:30 AM  LOS: 3 days   Admitting History: Pt has used PCA only minimally 1.25 mg in 24 hours. Currently pain is 7/10 and localized to legs and arms. No BM since admission. He is complaining of feeling dizzy with ambulation. There is a discrepancy between the patients report and that of his Physician with regard to HTN. However patient insists that he has been taking 2 antihypertensive medications up to time of admission. He does not know the names of the medications or which Pharmacy he gets them from. He will ask his fiance to check for the names of the medications and the Pharmacy at which they are  filled.  Consultants:  None  Procedures:  None  Antibiotics:  None   Objective: Vitals:   03/11/16 0642 03/11/16 0857 03/11/16 0950 03/11/16 1119  BP: (!) 149/85  (!) 177/88   Pulse: 60  (!) 56   Resp: 12 10 12    Temp: 98.2 F (36.8 C)  98.8 F (37.1 C)   TempSrc: Oral  Oral   SpO2: 91% 100% 97% 96%  Weight:      Height:       Weight change:   Intake/Output Summary (Last 24 hours) at 03/11/16 1130 Last data filed at 03/11/16 0950  Gross per 24 hour  Intake          1347.37 ml  Output             1550 ml  Net          -202.63 ml    General: Alert, awake, oriented x3, in mild distress.  HEENT: [name]/AT PEERL, EOMI, anicteric Neck: Trachea midline,  no masses, no thyromegal,y no JVD, no carotid bruit OROPHARYNX:  Moist, No exudate/ erythema/lesions.  Heart: Regular rate and rhythm, without murmurs, rubs, gallops, PMI non-displaced, no heaves or thrills on palpation.  Lungs: Clear to auscultation, no wheezing or rhonchi noted. No increased vocal fremitus resonant to percussion  Abdomen: Soft, nontender, nondistended, positive bowel sounds, no masses no hepatosplenomegaly noted.  Neuro: No focal neurological deficits noted cranial nerves II through XII grossly intact.  Strength at baselinein bilateral upper and lower extremities. Musculoskeletal: No warmth swelling or erythema around joints, no spinal tenderness noted. Psychiatric: Patient alert and oriented x3, good insight and cognition, good recent to remote recall.    Data Reviewed: Basic Metabolic  Panel:  Recent Labs Lab 03/07/16 1920 03/09/16 1121  NA 138 137  K 3.5 3.9  CL 104 102  CO2 26 31  GLUCOSE 102* 120*  BUN <5* 7  CREATININE 1.29* 1.42*  CALCIUM 10.7* 10.0   Liver Function Tests:  Recent Labs Lab 03/07/16 1920  AST 22  ALT 16*  ALKPHOS 79  BILITOT 1.9*  PROT 7.4  ALBUMIN 4.6   No results for input(s): LIPASE, AMYLASE in the last 168 hours. No results for input(s): AMMONIA in the  last 168 hours. CBC:  Recent Labs Lab 03/07/16 1920 03/09/16 1121  WBC 11.6* 10.6*  NEUTROABS 5.6 7.1  HGB 13.8 13.6  HCT 37.4* 37.1*  MCV 78.1 77.8*  PLT 252 228   Cardiac Enzymes: No results for input(s): CKTOTAL, CKMB, CKMBINDEX, TROPONINI in the last 168 hours. BNP (last 3 results) No results for input(s): BNP in the last 8760 hours.  ProBNP (last 3 results) No results for input(s): PROBNP in the last 8760 hours.  CBG: No results for input(s): GLUCAP in the last 168 hours.  No results found for this or any previous visit (from the past 240 hour(s)).   Studies: Dg Chest 2 View  Result Date: 03/07/2016 CLINICAL DATA:  Generalized pain in the mid chest with some shortness of breath EXAM: CHEST  2 VIEW COMPARISON:  08/04/2010 FINDINGS: The heart size and mediastinal contours are within normal limits. Both lungs are clear. The visualized skeletal structures are unremarkable. IMPRESSION: No active cardiopulmonary disease. Electronically Signed   By: Kennith CenterEric  Mansell M.D.   On: 03/07/2016 20:49   Ct Angio Chest Pe W/cm &/or Wo Cm  Result Date: 03/08/2016 CLINICAL DATA:  Chest pain, shortness of Breath starting yesterday, history of sickle cell disease, possible pulmonary embolus EXAM: CT ANGIOGRAPHY CHEST WITH CONTRAST TECHNIQUE: Multidetector CT imaging of the chest was performed using the standard protocol during bolus administration of intravenous contrast. Multiplanar CT image reconstructions and MIPs were obtained to evaluate the vascular anatomy. CONTRAST:  100 cc Isovue COMPARISON:  Images of the lung bases CT scan 11/07/2008 FINDINGS: Cardiovascular: Heart size within normal limits. No pericardial effusion. No aortic aneurysm. No pulmonary embolus is noted Mediastinum/Nodes: No mediastinal hematoma or adenopathy. There is diffuse thickening of esophageal wall. Findings are highly suspicious for gastroesophageal reflux disease. Clinical correlation is necessary. No hilar adenopathy  is noted. Central airways are patent Lungs/Pleura: Images of the lung parenchyma shows no infiltrate or pulmonary edema. Stable linear atelectasis or scarring in right lower lobe. No infarcted lung. No pneumothorax. No emphysema. No bronchiectasis. Upper Abdomen: Visualized upper abdomen shows no adrenal gland mass. The patient is status post cholecystectomy. The spleen is smaller in size as compared with prior exam measures 7.9 cm lying. The prior exam measured 13.2 cm in length. Mild lobulated splenic contour. Musculoskeletal: No destructive bony lesions are noted. Sagittal images of the spine shows mild degenerative changes mid and lower thoracic spine. Review of the MIP images confirms the above findings. IMPRESSION: 1. No pulmonary embolus is noted. 2. No acute infiltrate or pulmonary edema. Stable linear scarring or atelectasis in right lower lobe. 3. There is diffuse thickening of esophageal wall. This is highly suspicious for gastroesophageal reflux disease. Clinical correlation and further correlation with endoscopy is recommended. 4. There is interval splenic atrophy with spleen measuring about 8 cm in length. There is mild lobulated splenic contour. 5. Status post cholecystectomy. Electronically Signed   By: Natasha MeadLiviu  Pop M.D.   On: 03/08/2016 11:56  Scheduled Meds: . enoxaparin (LOVENOX) injection  40 mg Subcutaneous Q24H  . HYDROmorphone   Intravenous Q4H  . ketorolac  30 mg Intravenous Q6H  . loratadine  10 mg Oral Daily  . morphine  60 mg Oral Q12H  . oxyCODONE  60 mg Oral Q6H  . pantoprazole  40 mg Oral Daily  . senna-docusate  1 tablet Oral BID   Continuous Infusions: . dextrose 5 % and 0.45% NaCl 10 mL/hr at 03/10/16 1832    Active Problems:   Chest pain   Hb-S/hb-C disease with crisis (HCC)    In excess 50% involved face to face contact with the patient for assessment, counseling and coordination of care.     Veron Senner A.

## 2016-03-13 ENCOUNTER — Telehealth: Payer: Self-pay | Admitting: Internal Medicine

## 2016-03-13 NOTE — Telephone Encounter (Signed)
Spoke with Dr. Ronne BinningMcKenzie and made him aware of sleep apnea and recommendation for Polysomnogram- split study and titration. Also made him aware of the issues surrounding pt's accelerated HTN and his refusal of treatment in the setting of new dizziness. Also that the patient insists that he is taking antihypertensive medications however there is not a current prescription at the Pharmacy provided by the patient nor in [facility].

## 2016-03-24 ENCOUNTER — Encounter (HOSPITAL_BASED_OUTPATIENT_CLINIC_OR_DEPARTMENT_OTHER): Payer: Self-pay

## 2016-03-24 DIAGNOSIS — G4733 Obstructive sleep apnea (adult) (pediatric): Secondary | ICD-10-CM

## 2016-05-08 ENCOUNTER — Encounter (HOSPITAL_BASED_OUTPATIENT_CLINIC_OR_DEPARTMENT_OTHER): Payer: Medicaid Other

## 2016-09-22 ENCOUNTER — Ambulatory Visit (HOSPITAL_COMMUNITY)
Admission: RE | Admit: 2016-09-22 | Discharge: 2016-09-22 | Disposition: A | Discharge status: Home / Self Care | Payer: Medicaid Other | Attending: Psychiatry | Admitting: Psychiatry

## 2016-09-22 DIAGNOSIS — F1529 Other stimulant dependence with unspecified stimulant-induced disorder: Secondary | ICD-10-CM | POA: Diagnosis present

## 2016-09-22 DIAGNOSIS — F1420 Cocaine dependence, uncomplicated: Secondary | ICD-10-CM | POA: Diagnosis not present

## 2016-09-22 NOTE — H&P (Signed)
Behavioral Health Medical Screening Exam  Dean ReeksDonald R Webb is an 41 y.o. male [facility] by a friend with complaints of being tired of using drugs and requesting treatment. Patient denies any SI/HI/VAH.  Total Time spent with patient: 30 minutes  Psychiatric Specialty Exam: Physical Exam  Constitutional: He is oriented to person, place, and time. He appears well-developed and well-nourished.  HENT:  Head: Normocephalic and atraumatic.  Eyes: Pupils are equal, round, and reactive to light.  Neck: Normal range of motion.  Cardiovascular: Normal rate, regular rhythm and normal heart sounds.   Respiratory: Effort normal and breath sounds normal.  GI: Soft. Bowel sounds are normal.  Genitourinary:  Genitourinary Comments: Deferred   Musculoskeletal: Normal range of motion.  Neurological: He is alert and oriented to person, place, and time.  Skin: Skin is warm and dry.    Review of Systems  Psychiatric/Behavioral: Positive for depression and substance abuse. Negative for hallucinations, memory loss and suicidal ideas. The patient is not nervous/anxious and does not have insomnia.   All other systems reviewed and are negative.   Blood pressure (!) 142/94, pulse 86, temperature 98.5 F (36.9 C), temperature source Oral, resp. rate 16, SpO2 99 %.There is no height or weight on file to calculate BMI.  General Appearance: Casual  Eye Contact:  Fair  Speech:  Clear and Coherent and Normal Rate  Volume:  Normal  Mood:  Depressed  Affect:  Depressed  Thought Process:  Coherent and Goal Directed  Orientation:  Full (Time, Place, and Person)  Thought Content:  WDL and Logical  Suicidal Thoughts:  No  Homicidal Thoughts:  No  Memory:  Immediate;   Good Recent;   Good Remote;   Fair  Judgement:  Good  Insight:  Good  Psychomotor Activity:  Normal  Concentration: Concentration: Good and Attention Span: Good  Recall:  Good  Fund of Knowledge:Good  Language:  Good  Akathisia:  Negative  Handed:  Right  AIMS (if indicated):     Assets:  Communication Skills Desire for Improvement Financial Resources/Insurance Housing Leisure Time Physical Health Resilience Social Support  Sleep:       Musculoskeletal: Strength & Muscle Tone: within normal limits Gait & Station: normal Patient leans: N/A  Blood pressure (!) 142/94, pulse 86, temperature 98.5 F (36.9 C), temperature source Oral, resp. rate 16, SpO2 99 %.  Recommendations:  Based on my evaluation the patient does not appear to have an emergency medical condition.  OP resources for substance abuse treatment centers provided, patient agrees to follow up. Patient takes HBP medications at home, haven't taken today   Delila PereyraJustina A Gentry Pilson, NP 09/22/2016, 9:12 AM
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
## 2017-04-22 ENCOUNTER — Other Ambulatory Visit: Payer: Self-pay

## 2017-04-22 ENCOUNTER — Emergency Department (HOSPITAL_COMMUNITY): Payer: Medicaid Other

## 2017-04-22 ENCOUNTER — Inpatient Hospital Stay (HOSPITAL_COMMUNITY)
Admission: EM | Admit: 2017-04-22 | Discharge: 2017-04-26 | DRG: 811 | Disposition: A | Discharge status: Home / Self Care | Payer: Medicaid Other | Attending: Internal Medicine | Admitting: Internal Medicine

## 2017-04-22 ENCOUNTER — Encounter (HOSPITAL_COMMUNITY): Payer: Self-pay

## 2017-04-22 DIAGNOSIS — G894 Chronic pain syndrome: Secondary | ICD-10-CM | POA: Diagnosis present

## 2017-04-22 DIAGNOSIS — G4733 Obstructive sleep apnea (adult) (pediatric): Secondary | ICD-10-CM | POA: Diagnosis not present

## 2017-04-22 DIAGNOSIS — D57219 Sickle-cell/Hb-C disease with crisis, unspecified: Secondary | ICD-10-CM | POA: Diagnosis not present

## 2017-04-22 DIAGNOSIS — R42 Dizziness and giddiness: Secondary | ICD-10-CM | POA: Diagnosis present

## 2017-04-22 DIAGNOSIS — F1123 Opioid dependence with withdrawal: Secondary | ICD-10-CM

## 2017-04-22 DIAGNOSIS — J45909 Unspecified asthma, uncomplicated: Secondary | ICD-10-CM | POA: Diagnosis present

## 2017-04-22 DIAGNOSIS — R0902 Hypoxemia: Secondary | ICD-10-CM

## 2017-04-22 DIAGNOSIS — D5700 Hb-SS disease with crisis, unspecified: Secondary | ICD-10-CM

## 2017-04-22 DIAGNOSIS — Z825 Family history of asthma and other chronic lower respiratory diseases: Secondary | ICD-10-CM

## 2017-04-22 DIAGNOSIS — Z832 Family history of diseases of the blood and blood-forming organs and certain disorders involving the immune mechanism: Secondary | ICD-10-CM

## 2017-04-22 DIAGNOSIS — D5701 Hb-SS disease with acute chest syndrome: Secondary | ICD-10-CM | POA: Diagnosis not present

## 2017-04-22 DIAGNOSIS — K219 Gastro-esophageal reflux disease without esophagitis: Secondary | ICD-10-CM | POA: Diagnosis present

## 2017-04-22 DIAGNOSIS — D5720 Sickle-cell/Hb-C disease without crisis: Secondary | ICD-10-CM | POA: Diagnosis present

## 2017-04-22 DIAGNOSIS — Z79891 Long term (current) use of opiate analgesic: Secondary | ICD-10-CM

## 2017-04-22 DIAGNOSIS — J189 Pneumonia, unspecified organism: Secondary | ICD-10-CM | POA: Diagnosis present

## 2017-04-22 DIAGNOSIS — I10 Essential (primary) hypertension: Secondary | ICD-10-CM | POA: Diagnosis not present

## 2017-04-22 DIAGNOSIS — D57211 Sickle-cell/Hb-C disease with acute chest syndrome: Principal | ICD-10-CM | POA: Diagnosis present

## 2017-04-22 DIAGNOSIS — Z8249 Family history of ischemic heart disease and other diseases of the circulatory system: Secondary | ICD-10-CM

## 2017-04-22 DIAGNOSIS — Z833 Family history of diabetes mellitus: Secondary | ICD-10-CM

## 2017-04-22 LAB — INFLUENZA PANEL BY PCR (TYPE A & B)
INFLAPCR: NEGATIVE
INFLBPCR: NEGATIVE

## 2017-04-22 LAB — COMPREHENSIVE METABOLIC PANEL
ALBUMIN: 4.7 g/dL (ref 3.5–5.0)
ALT: 24 U/L (ref 17–63)
AST: 30 U/L (ref 15–41)
Alkaline Phosphatase: 90 U/L (ref 38–126)
Anion gap: 12 (ref 5–15)
BUN: 8 mg/dL (ref 6–20)
CHLORIDE: 107 mmol/L (ref 101–111)
CO2: 22 mmol/L (ref 22–32)
Calcium: 10.9 mg/dL — ABNORMAL HIGH (ref 8.9–10.3)
Creatinine, Ser: 1.04 mg/dL (ref 0.61–1.24)
GFR calc Af Amer: >60 mL/min (ref >60)
Glucose, Bld: 111 mg/dL — ABNORMAL HIGH (ref 65–99)
POTASSIUM: 3.9 mmol/L (ref 3.5–5.1)
Sodium: 141 mmol/L (ref 135–145)
Total Bilirubin: 1.4 mg/dL — ABNORMAL HIGH (ref 0.3–1.2)
Total Protein: 7.9 g/dL (ref 6.5–8.1)

## 2017-04-22 LAB — RETICULOCYTES
RBC.: 4.87 MIL/uL (ref 4.22–5.81)
Retic Count, Absolute: 194.8 10*3/uL — ABNORMAL HIGH (ref 19.0–186.0)
Retic Ct Pct: 4 % — ABNORMAL HIGH (ref 0.4–3.1)

## 2017-04-22 LAB — CBC WITH DIFFERENTIAL/PLATELET
Basophils Absolute: 0 10*3/uL (ref 0.0–0.1)
Basophils Relative: 0 %
EOS PCT: 2 %
Eosinophils Absolute: 0.2 10*3/uL (ref 0.0–0.7)
HEMATOCRIT: 38.1 % — AB (ref 39.0–52.0)
HEMOGLOBIN: 14.1 g/dL (ref 13.0–17.0)
LYMPHS ABS: 3.4 10*3/uL (ref 0.7–4.0)
LYMPHS PCT: 36 %
MCH: 29 pg (ref 26.0–34.0)
MCHC: 37 g/dL — ABNORMAL HIGH (ref 30.0–36.0)
MCV: 78.2 fL (ref 78.0–100.0)
Monocytes Absolute: 1.2 10*3/uL — ABNORMAL HIGH (ref 0.1–1.0)
Monocytes Relative: 13 %
Neutro Abs: 4.6 10*3/uL (ref 1.7–7.7)
Neutrophils Relative %: 49 %
PLATELETS: 307 10*3/uL (ref 150–400)
RBC: 4.87 MIL/uL (ref 4.22–5.81)
RDW: 17.3 % — ABNORMAL HIGH (ref 11.5–15.5)
WBC: 9.4 10*3/uL (ref 4.0–10.5)

## 2017-04-22 LAB — I-STAT TROPONIN, ED: TROPONIN I, POC: 0 ng/mL (ref 0.00–0.08)

## 2017-04-22 MED ORDER — MORPHINE SULFATE ER 30 MG PO TBCR: 60.0000 mg | EXTENDED_RELEASE_TABLET | Freq: Two times a day (BID) | ORAL | Status: DC

## 2017-04-22 MED ORDER — HYDROMORPHONE HCL 2 MG/ML IJ SOLN
2.0000 mg | INTRAMUSCULAR | Status: AC
  Administered 2017-04-22: 2 mg via INTRAVENOUS
  Filled 2017-04-22: qty 1

## 2017-04-22 MED ORDER — HYDROMORPHONE HCL 2 MG/ML IJ SOLN: 2.0000 mg | INTRAMUSCULAR | Status: DC

## 2017-04-22 MED ORDER — LORATADINE 10 MG PO TABS
10.0000 mg | ORAL_TABLET | Freq: Every day | ORAL | Status: DC
  Administered 2017-04-22 – 2017-04-26 (×4): 10 mg via ORAL
  Filled 2017-04-22 (×5): qty 1

## 2017-04-22 MED ORDER — HYDROMORPHONE HCL 1 MG/ML IJ SOLN
1.0000 mg | Freq: Once | INTRAMUSCULAR | Status: AC
  Administered 2017-04-22: 1 mg via INTRAVENOUS
  Filled 2017-04-22: qty 1

## 2017-04-22 MED ORDER — ENOXAPARIN SODIUM 40 MG/0.4ML ~~LOC~~ SOLN
40.0000 mg | SUBCUTANEOUS | Status: DC
  Administered 2017-04-22 – 2017-04-25 (×4): 40 mg via SUBCUTANEOUS
  Filled 2017-04-22 (×4): qty 0.4

## 2017-04-22 MED ORDER — ONDANSETRON HCL 4 MG/2ML IJ SOLN
4.0000 mg | INTRAMUSCULAR | Status: DC | PRN
  Administered 2017-04-22 – 2017-04-23 (×3): 4 mg via INTRAVENOUS
  Filled 2017-04-22 (×3): qty 2

## 2017-04-22 MED ORDER — AMLODIPINE BESYLATE 10 MG PO TABS
10.0000 mg | ORAL_TABLET | Freq: Every day | ORAL | Status: DC
  Administered 2017-04-22 – 2017-04-26 (×5): 10 mg via ORAL
  Filled 2017-04-22 (×5): qty 1

## 2017-04-22 MED ORDER — KETOROLAC TROMETHAMINE 30 MG/ML IJ SOLN
30.0000 mg | Freq: Four times a day (QID) | INTRAMUSCULAR | Status: DC
  Administered 2017-04-22 – 2017-04-26 (×14): 30 mg via INTRAVENOUS
  Filled 2017-04-22 (×15): qty 1

## 2017-04-22 MED ORDER — HYDROMORPHONE HCL 2 MG/ML IJ SOLN: 2.0000 mg | INTRAMUSCULAR | Status: AC

## 2017-04-22 MED ORDER — MORPHINE SULFATE ER 30 MG PO TBCR
60.0000 mg | EXTENDED_RELEASE_TABLET | Freq: Two times a day (BID) | ORAL | Status: DC
  Administered 2017-04-22 – 2017-04-23 (×3): 60 mg via ORAL
  Filled 2017-04-22 (×3): qty 2

## 2017-04-22 MED ORDER — OXYCODONE HCL 5 MG PO TABS
30.0000 mg | ORAL_TABLET | ORAL | Status: DC | PRN
  Administered 2017-04-23 (×2): 30 mg via ORAL
  Filled 2017-04-22 (×2): qty 6

## 2017-04-22 MED ORDER — SENNOSIDES-DOCUSATE SODIUM 8.6-50 MG PO TABS
1.0000 | ORAL_TABLET | Freq: Two times a day (BID) | ORAL | Status: DC
  Administered 2017-04-22 – 2017-04-26 (×8): 1 via ORAL
  Filled 2017-04-22 (×9): qty 1

## 2017-04-22 MED ORDER — SODIUM CHLORIDE 0.45 % IV SOLN
INTRAVENOUS | Status: DC
  Administered 2017-04-22 – 2017-04-24 (×5): via INTRAVENOUS

## 2017-04-22 MED ORDER — ALBUTEROL SULFATE (2.5 MG/3ML) 0.083% IN NEBU: 2.5000 mg | INHALATION_SOLUTION | Freq: Four times a day (QID) | RESPIRATORY_TRACT | Status: DC | PRN

## 2017-04-22 MED ORDER — OXYCODONE HCL 5 MG PO TABS: 30.0000 mg | ORAL_TABLET | Freq: Once | ORAL | Status: DC

## 2017-04-22 MED ORDER — MORPHINE SULFATE ER 30 MG PO TBCR
60.0000 mg | EXTENDED_RELEASE_TABLET | Freq: Two times a day (BID) | ORAL | Status: DC
  Administered 2017-04-22: 60 mg via ORAL
  Filled 2017-04-22: qty 4

## 2017-04-22 MED ORDER — KETOROLAC TROMETHAMINE 30 MG/ML IJ SOLN
30.0000 mg | Freq: Once | INTRAMUSCULAR | Status: AC
  Administered 2017-04-22: 30 mg via INTRAVENOUS
  Filled 2017-04-22: qty 1

## 2017-04-22 MED ORDER — POLYETHYLENE GLYCOL 3350 17 G PO PACK: 17.0000 g | PACK | Freq: Every day | ORAL | Status: DC | PRN

## 2017-04-22 NOTE — ED Triage Notes (Signed)
States sickle cell pain since 2300 pm last pm pain worse than usual.

## 2017-04-22 NOTE — ED Notes (Signed)
Patient's oxygen began to drop after x2 doses of pain medicine. Patient placed on 3L O2 [place]. EDPA made aware.

## 2017-04-22 NOTE — ED Provider Notes (Signed)
42 year old male received at sign out from PA Whitney pending influenza panel and improvement of symptoms. Per her history:   "42 year old male with past medical history significant for asthma, GERD, sickle cell anemia type [place], hypertension, presenting to the ED with sickle cell pain crisis.  States this began last night around 11 PM and has been worsening all night.  Reports pain primarily in his low back and legs which is typical for him.  States he has had a little bit of a cough and some mild chest pain recently.  He denies any fever but has had chills.  He has been around his uncle who was sick with flu.  Patient denies any shortness of breath.  He has no known cardiac history.  States he has been taking his home pain medications without relief."  He reports that he has not taken his a.m. dose of his home medications prior to arrival in the ED.  Physical Exam  BP 124/86   Pulse (!) 57   Temp 98.4 F (36.9 C) (Oral)   Resp 20   Ht 5\' 7"  (1.702 m)   Wt 89.4 kg (197 lb)   SpO2 96%   BMI 30.85 kg/m   Physical Exam A&O x3.  Sleeping, but easily arousable.   ED Course/Procedures   Clinical Course as of Apr 22 1332  Sun Apr 22, 2017  1306 Recheck.  After reviewing the [place] CSRS, patients MS Contin and Oxycodone lasted filled on 03/20/17. He now states that he has not taken his home medication in almost a week. Pain has improved from 8 to 7.5 with 1 mg of Dilaudid. Home IR oxycodone ordered.   [MM]    Clinical Course User Index [MM] McDonald, Mia A, PA-C    Procedures  MDM    41 year old male with past medical history significant for asthma, GERD, OSA, sickle cell anemia type [place], hypertension received at signout from [facility] with sickle cell pain crisis, mostly pain in the back and legs. He haslo had cough, chills, and intermittent chest pain, but was afebrile.  PA Sanders ordered flu, which was negative. CXR was normal. EKG non-ischemic. Labs are overall reassuring.   Patient is  opioid tolerant, but after given Dilaudid, SaO2 decreased to 85-86% on RA.  This occurred before I was signed out the patient, but based on document vital signs, respirations do not appear to have decreased.   The patient's medical record was reviewed.  During his last ED visit and admission on 03/07/16, the patient became hypoxic and somnolent in the ED after multiple doses of Dilaudid.  After admission, he was switched to Toradol and PCA, but required CPAP while sleeping.  He was advised to follow-up to have a sleep study performed, but it does not appear that he did.  I suspect that his somnolence today is related to opioids with underlying OSA.   D/ced last dose of 2mg  Dilaudid and gave 30 mg of Toradol and home dose of MS Contin. On recheck, patient continued to rate his pain 8/10, which was his pain on arrival to the ED. Spoke  [place] CSRS with patient refill on MS Contin and Oxycodone on 03/20/2017. Patient initially reported he had not missed any doses of his home medications, but is now stating that he has been out of his home medications for almost a week. PCP was Dr. Ronne Binning, and now the patient does not have a PCP. The patient was given an additional dose of 1 mg Dilaudid,  which minimally improved his pain to 7.5/10.   Spoke with Dr. Ashley RoyaltyMatthews who will admit the patient. Home IR oxycodone ordered. The patient appears reasonably stabilized for admission considering the current resources, flow, and capabilities available in the ED at this time, and I doubt any other [facility] requiring further screening and/or treatment in the ED prior to admission.     Barkley BoardsMcDonald, Mia A, PA-C 04/22/17 1334    Jacalyn LefevreHaviland, Julie, MD 04/22/17 1434

## 2017-04-22 NOTE — ED Notes (Signed)
Patient transported to X-ray 

## 2017-04-22 NOTE — H&P (Signed)
Hospital Admission Note Date: 04/22/2017  Patient name: Dean ReeksDonald R Rorabaugh Medical record number: [id number] Date of birth: 03/08/1975 Age: 42 y.o. Gender: male PCP: Billee CashingMcKenzie, [name], MD  Attending physician: Altha HarmMatthews, Reakwon Barren A, MD  Chief Complaint: Pain in leg and back x 1 week  History of Present Illness: This is a gentleman with hemoglobin [place] who is normally followed by Dr. Ronne BinningMcKenzie.  Unfortunately the patient lost access to his healthcare as his physicians practice closed and was out of medications for the last week.  She states that he tried to manage his pain with ibuprofen and adjunctive heat therapy however today the pain became unbearable and he presented to the ED for further management.  Patient states that his pain is localized to his low back and both lower extremities.  Patient states that upon arrival to the emergency department his pain was a 10/10 however after some management in the emergency department his pain is now at 8/10.  The patient states that he is normally able to manage his pain with oral medications when it is below 6/10.  The patient says he has had an occasional nonproductive cough.  However he has had no fevers, chills, shortness of breath, dizziness, chest pain or weakness.  And influenza PCR was negative for influenza A and influenza B.  He also had a 2 view chest x-ray performed which was acute pulmonary process.  Vital signs are all stable and review of his labs shows no significant abnormalities.  I am asked to admit the patient for sickle cell crisis.  Scheduled Meds: . morphine  60 mg Oral Q12H  . oxyCODONE  30 mg Oral Once   Continuous Infusions: . sodium chloride 125 mL/hr at 04/22/17 0524   PRN Meds:.ondansetron Allergies: Patient has no known allergies. Past Medical History:  Diagnosis Date  . Asthma   . GERD (gastroesophageal reflux disease)   . Hb-S/Hb-C disease (HCC) 1979  . HTN (hypertension) 2008  . Hypertension   . Sickle cell anemia  (HCC)   . Sickle cell disease (HCC)    Past Surgical History:  Procedure Laterality Date  . CHOLECYSTECTOMY     Family History  Problem Relation Age of Onset  . Sickle cell trait Mother   . Hypertension Mother   . Asthma Mother   . Sickle cell trait Father   . Congestive Heart Failure Father   . Hypertension Sister   . Sickle cell anemia Daughter   . Hypertension Daughter   . Diabetes Daughter    Social History   Socioeconomic History  . Marital status: Single    Spouse name: Not on file  . Number of children: Not on file  . Years of education: Not on file  . Highest education level: Not on file  Social Needs  . Financial resource strain: Not on file  . Food insecurity - worry: Not on file  . Food insecurity - inability: Not on file  . Transportation needs - medical: Not on file  . Transportation needs - non-medical: Not on file  Occupational History  . Not on file  Tobacco Use  . Smoking status: Current Some Day Smoker    Packs/day: 1.50    Years: 17.00    Pack years: 25.50  . Smokeless tobacco: Never Used  Substance and Sexual Activity  . Alcohol use: Yes    Comment: Occasional drinker  . Drug use: Yes    Types: Marijuana  . Sexual activity: Yes  Other Topics Concern  .  Not on file  Social History Narrative  . Not on file   Review of Systems: Pertinent items noted in HPI and remainder of comprehensive ROS otherwise negative.  Physical Exam: No intake or output data in the 24 hours ending 04/22/17 1308 General: Alert, awake, oriented x 3.  He is in mild distress secondary to pain. HEENT: [place], EOMI, anicteric Neck: Trachea midline,  no masses, no thyromegal,y no JVD, no carotid bruit OROPHARYNX:  Moist, No exudate/ erythema/lesions.  Heart: Regular rate and rhythm, without murmurs, rubs, gallops, PMI non-displaced, no heaves or thrills on palpation.  Lungs: Clear to auscultation, no wheezing or rhonchi noted. No increased vocal fremitus resonant  to percussion  Abdomen: Soft, nontender, nondistended, positive bowel sounds, no masses no hepatosplenomegaly noted.  Neuro: No focal neurological deficits noted cranial nerves II through XII grossly intact.  Strength at baseline in bilateral upper and lower extremities. Musculoskeletal: No warmth swelling or erythema around joints, no spinal tenderness noted. Psychiatric: Patient alert and oriented x3, good insight and cognition, good recent to remote recall.   Lab results: Recent Labs    04/22/17 0525  NA 141  K 3.9  CL 107  CO2 22  GLUCOSE 111*  BUN 8  CREATININE 1.04  CALCIUM 10.9*   Recent Labs    04/22/17 0525  AST 30  ALT 24  ALKPHOS 90  BILITOT 1.4*  PROT 7.9  ALBUMIN 4.7   No results for input(s): LIPASE, AMYLASE in the last 72 hours. Recent Labs    04/22/17 0525  WBC 9.4  NEUTROABS 4.6  HGB 14.1  HCT 38.1*  MCV 78.2  PLT 307   No results for input(s): CKTOTAL, CKMB, CKMBINDEX, TROPONINI in the last 72 hours. Invalid input(s): POCBNP No results for input(s): DDIMER in the last 72 hours. No results for input(s): HGBA1C in the last 72 hours. No results for input(s): CHOL, HDL, LDLCALC, TRIG, CHOLHDL, LDLDIRECT in the last 72 hours. No results for input(s): TSH, T4TOTAL, T3FREE, THYROIDAB in the last 72 hours.  Invalid input(s): FREET3 Recent Labs    04/22/17 0525  RETICCTPCT 4.0*   Imaging results:  Dg Chest 2 View  Result Date: 04/22/2017 CLINICAL DATA:  42 y/o M; cough and chest pain for a couple hours. History of sickle cell disease, hypertension, asthma. EXAM: CHEST - 2 VIEW COMPARISON:  03/07/2016 chest radiograph. FINDINGS: Stable heart size and mediastinal contours are within normal limits. Both lungs are clear. The visualized skeletal structures are unremarkable. IMPRESSION: No acute pulmonary process identified. Electronically Signed   By: Mitzi Hansen M.D.   On: 04/22/2017 05:43   Other results: EKG: normal EKG, normal sinus  rhythm, unchanged from previous tracings.   Assessment and Plan: 1. 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 Will restart home medications,and re-evaluate pain control.  2. Opiate Withdrawal: Resolved since receiving medications in ED.  3. HTN: BP well controlled. Continue Norvasc 10 mg 4. H/O OSA: Pt has not had CPAP but was diagnosed with OSA. Will ask respiratory to provide auto CPAP.  5. DVT Prophylaxis: Lovenox.   Sorah Falkenstein A. 04/22/2017, 1:08 PM  Excess of 60 minutes spent during this encounter.  Greater than 50% spent in face-to-face contact counseling and coordination of care.  Yurani Fettes A.  Pager [phone]. If 7PM-7AM, please contact night-coverage.

## 2017-04-22 NOTE — ED Notes (Signed)
ED TO INPATIENT HANDOFF REPORT  Name/Age/Gender Dean Webb 42 y.o. male  Code Status Code Status History    Date Active Date Inactive Code Status Order ID Comments User Context   03/08/2016 18:01 03/11/2016 17:34 Full Code [id number]  Leana Gamer, MD Inpatient      Home/SNF/Other Home  Chief Complaint sickle cell crisis  Level of Care/Admitting Diagnosis ED Disposition    ED Disposition Condition [place] [facility] Area: [facility] [[id number]]  Level of Care: Med-Surg [16]  Diagnosis: Sickle-cell/Hb-C disease with pain [facility]) [[id number]]  Admitting Physician: Leana Gamer [[id number]]  Attending Physician: Liston Alba A [[id number]]  PT Class (Do Not Modify): Observation [104]  PT Acc Code (Do Not Modify): Observation [10022]       Medical History Past Medical History:  Diagnosis Date  . Asthma   . GERD (gastroesophageal reflux disease)   . Hb-S/Hb-C disease ([place]) 1979  . HTN (hypertension) 2008  . Hypertension   . Sickle cell anemia (HCC)   . Sickle cell disease ([place])     Allergies No Known Allergies  IV Location/Drains/Wounds Patient Lines/Drains/Airways Status   Active Line/Drains/Airways    None          Labs/Imaging Results for orders placed or performed during the hospital encounter of 04/22/17 (from the past 48 hour(s))  Comprehensive metabolic panel     Status: Abnormal   Collection Time: 04/22/17  5:25 AM  Result Value Ref Range   Sodium 141 135 - 145 mmol/L   Potassium 3.9 3.5 - 5.1 mmol/L   Chloride 107 101 - 111 mmol/L   CO2 22 22 - 32 mmol/L   Glucose, Bld 111 (H) 65 - 99 mg/dL   BUN 8 6 - 20 mg/dL   Creatinine, Ser 1.04 0.61 - 1.24 mg/dL   Calcium 10.9 (H) 8.9 - 10.3 mg/dL   Total Protein 7.9 6.5 - 8.1 g/dL   Albumin 4.7 3.5 - 5.0 g/dL   AST 30 15 - 41 U/L   ALT 24 17 - 63 U/L   Alkaline Phosphatase 90 38 - 126 U/L   Total Bilirubin 1.4 (H) 0.3 - 1.2 mg/dL   GFR calc non Af Amer >60 >60  mL/min   GFR calc Af Amer >60 >60 mL/min    Comment: (NOTE) The eGFR has been calculated using the CKD EPI equation. This calculation has not been validated in all clinical situations. eGFR's persistently <60 mL/min signify possible Chronic Kidney Disease.    Anion gap 12 5 - 15    Comment: Performed at [facility], [place] [address] [place], [place] [address]  CBC with Differential     Status: Abnormal   Collection Time: 04/22/17  5:25 AM  Result Value Ref Range   WBC 9.4 4.0 - 10.5 K/uL   RBC 4.87 4.22 - 5.81 MIL/uL   Hemoglobin 14.1 13.0 - 17.0 g/dL   HCT 38.1 (L) 39.0 - 52.0 %   MCV 78.2 78.0 - 100.0 fL   MCH 29.0 26.0 - 34.0 pg   MCHC 37.0 (H) 30.0 - 36.0 g/dL   RDW 17.3 (H) 11.5 - 15.5 %   Platelets 307 150 - 400 K/uL   Neutrophils Relative % 49 %   Neutro Abs 4.6 1.7 - 7.7 K/uL   Lymphocytes Relative 36 %   Lymphs Abs 3.4 0.7 - 4.0 K/uL   Monocytes Relative 13 %   Monocytes Absolute 1.2 (H) 0.1 - 1.0  K/uL   Eosinophils Relative 2 %   Eosinophils Absolute 0.2 0.0 - 0.7 K/uL   Basophils Relative 0 %   Basophils Absolute 0.0 0.0 - 0.1 K/uL    Comment: Performed at [facility], [place] [address] [place], [place] [address]  Reticulocytes     Status: Abnormal   Collection Time: 04/22/17  5:25 AM  Result Value Ref Range   Retic Ct Pct 4.0 (H) 0.4 - 3.1 %   RBC. 4.87 4.22 - 5.81 MIL/uL   Retic Count, Absolute 194.8 (H) 19.0 - 186.0 K/uL    Comment: Performed at [facility], [place] [address], [place], [place] [address]  Influenza panel by PCR (type A & B)     Status: None   Collection Time: 04/22/17  5:25 AM  Result Value Ref Range   Influenza A By PCR NEGATIVE NEGATIVE   Influenza B By PCR NEGATIVE NEGATIVE    Comment: (NOTE) The Xpert Xpress Flu assay is intended as an aid in the diagnosis of  influenza and should not be used as a sole basis for treatment.  This  assay is FDA approved for nasopharyngeal swab  specimens only. Nasal  washings and aspirates are unacceptable for Xpert Xpress Flu testing. Performed at [facility], [place] [address], [place], [place] [address]   I-stat troponin, ED     Status: None   Collection Time: 04/22/17  5:30 AM  Result Value Ref Range   Troponin i, poc 0.00 0.00 - 0.08 ng/mL   Comment 3            Comment: Due to the release kinetics of cTnI, a negative result within the first hours of the onset of symptoms does not rule out myocardial infarction with certainty. If myocardial infarction is still suspected, repeat the test at appropriate intervals.    Dg Chest 2 View  Result Date: 04/22/2017 CLINICAL DATA:  42 y/o M; cough and chest pain for a couple hours. History of sickle cell disease, hypertension, asthma. EXAM: CHEST - 2 VIEW COMPARISON:  03/07/2016 chest radiograph. FINDINGS: Stable heart size and mediastinal contours are within normal limits. Both lungs are clear. The visualized skeletal structures are unremarkable. IMPRESSION: No acute pulmonary process identified. Electronically Signed   By: Kristine Garbe M.D.   On: 04/22/2017 05:43    Pending Labs [employer] (From admission, onward)   Start     Ordered   Signed and Held  HIV antibody (Routine Testing)  Once,   R     Signed and Held   Signed and Held  Creatinine, serum  (enoxaparin (LOVENOX)    CrCl >/= 30 ml/min)  Weekly,   R    Comments:  while on enoxaparin therapy    Signed and Held      Vitals/Pain Today's Vitals   04/22/17 1200 04/22/17 1225 04/22/17 1300 04/22/17 1330  BP: (!) 131/92  124/86 (!) 141/88  Pulse: (!) 57  (!) 57 (!) 54  Resp: _0 Temp:      TempSrc:      SpO2: 98%  96% 100%  Weight:      Height:      PainSc:  8       Isolation Precautions Droplet precaution  Medications Medications  0.45 % sodium chloride infusion ( Intravenous Stopped 04/22/17 1356)  ondansetron (ZOFRAN) injection 4 mg (4 mg Intravenous Given  04/22/17 0525)  morphine (MS CONTIN) 12 hr tablet 60  mg (60 mg Oral Given 04/22/17 1022)  oxyCODONE (Oxy IR/ROXICODONE) immediate release tablet 30 mg (30 mg Oral Not Given 04/22/17 1317)  HYDROmorphone (DILAUDID) injection 2 mg (2 mg Intravenous Given 04/22/17 0524)    Or  HYDROmorphone (DILAUDID) injection 2 mg ( Subcutaneous See Alternative 04/22/17 0524)  HYDROmorphone (DILAUDID) injection 2 mg (2 mg Intravenous Given 04/22/17 0615)    Or  HYDROmorphone (DILAUDID) injection 2 mg ( Subcutaneous See Alternative 04/22/17 0615)  ketorolac (TORADOL) 30 MG/ML injection 30 mg (30 mg Intravenous Given 04/22/17 0834)  HYDROmorphone (DILAUDID) injection 1 mg (1 mg Intravenous Given 04/22/17 1225)    Mobility walks

## 2017-04-22 NOTE — Progress Notes (Signed)
RT attempted to place patient on CPAP. Patient stated that he could not breath with the mask on.Patient has on 2 liters nasal canula .

## 2017-04-22 NOTE — ED Provider Notes (Signed)
[facility]-EMERGENCY DEPT Provider Note   CSN: [id number] Arrival date & time: 04/22/17  0347     History   Chief Complaint Chief Complaint  Patient presents with  . Sickle Cell Pain Crisis    HPI Dean Webb is a 42 y.o. male.  The history is provided by the patient and medical records.     42 year old male with past medical history significant for asthma, GERD, sickle cell anemia type [place], hypertension, presenting to the ED with sickle cell pain crisis.  States this began last night around 11 PM and has been worsening all night.  Reports pain primarily in his low back and legs which is typical for him.  States he has had a little bit of a cough and some mild chest pain recently.  He denies any fever but has had chills.  He has been around his uncle who was sick with flu.  Patient denies any shortness of breath.  He has no known cardiac history.  States he has been taking his home pain medications without relief.  Past Medical History:  Diagnosis Date  . Asthma   . GERD (gastroesophageal reflux disease)   . Hb-S/Hb-C disease (HCC) 1979  . HTN (hypertension) 2008  . Hypertension   . Sickle cell anemia (HCC)   . Sickle cell disease [facility](HCC)     Patient Active Problem List   Diagnosis Date Noted  . Accelerated hypertension 03/11/2016  . Dizziness 03/11/2016  . Chronic prescription opiate use 03/11/2016  . Hb-S/hb-C disease with crisis (HCC) 03/08/2016  . Sickle cell disease (HCC)     Past Surgical History:  Procedure Laterality Date  . CHOLECYSTECTOMY         Home Medications    Prior to Admission medications   Medication Sig Start Date End Date Taking? Authorizing Provider  albuterol (PROVENTIL HFA;VENTOLIN HFA) 108 (90 BASE) MCG/ACT inhaler Inhale 2 puffs into the lungs every 6 (six) hours as needed for wheezing or shortness of breath.     [provider]  amoxicillin-clavulanate (AUGMENTIN) 875-125 MG tablet Take 1 tablet by  mouth 2 (two) times daily. Patient not taking: Reported on 03/08/2016 09/02/15   Tery Sanfilippoiester, Matthew, MD  cetirizine (ZYRTEC) 10 MG tablet Take 10 mg by mouth daily as needed for allergies.    [provider]  morphine (MS CONTIN) 60 MG 12 hr tablet Take 60 mg by mouth every 12 (twelve) hours.    [provider]  oxycodone (ROXICODONE) 30 MG immediate release tablet Take 30 mg by mouth 2 (two) times daily as needed for pain.     [provider]    Family History Family History  Problem Relation Age of Onset  . Sickle cell trait Mother   . Hypertension Mother   . Asthma Mother   . Sickle cell trait Father   . Congestive Heart Failure Father   . Hypertension Sister   . Sickle cell anemia Daughter   . Hypertension Daughter   . Diabetes Daughter     Social History Social History   Tobacco Use  . Smoking status: Current Some Day Smoker    Packs/day: 1.50    Years: 17.00    Pack years: 25.50  . Smokeless tobacco: Never Used  Substance Use Topics  . Alcohol use: Yes    Comment: Occasional drinker  . Drug use: Yes    Types: Marijuana     Allergies   Patient has no known allergies.   Review  of Systems Review of Systems  Respiratory: Positive for cough.   Cardiovascular: Positive for chest pain.  Musculoskeletal: Positive for arthralgias and back pain.  All other systems reviewed and are negative.    Physical Exam Updated Vital Signs BP (!) 153/113 (BP Location: Left Arm)   Pulse (!) 102   Temp 98.4 F (36.9 C) (Oral)   Resp 20   Ht 5\' 7"  (1.702 m)   Wt 89.4 kg (197 lb)   SpO2 95%   BMI 30.85 kg/m   Physical Exam  Constitutional: He is oriented to person, place, and time. He appears well-developed and well-nourished.  Intermittently wincing during exam, appears uncomfortable  HENT:  Head: Normocephalic and atraumatic.  Mouth/Throat: Oropharynx is clear and moist.  Eyes: Conjunctivae and EOM are normal. Pupils are equal, round, and  reactive to light.  Neck: Normal range of motion.  Cardiovascular: Normal rate, regular rhythm and normal heart sounds.  Pulmonary/Chest: Effort normal and breath sounds normal. No stridor. No respiratory distress.  Abdominal: Soft. Bowel sounds are normal. There is no tenderness. There is no rebound.  Musculoskeletal: Normal range of motion.  Neurological: He is alert and oriented to person, place, and time.  Skin: Skin is warm and dry.  Psychiatric: He has a normal mood and affect.  Nursing note and vitals reviewed.    ED Treatments / Results  Labs (all labs ordered are listed, but only abnormal results are displayed) Labs Reviewed  COMPREHENSIVE METABOLIC PANEL - Abnormal; Notable for the following components:      Result Value   Glucose, Bld 111 (*)    Calcium 10.9 (*)    Total Bilirubin 1.4 (*)    All other components within normal limits  CBC WITH DIFFERENTIAL/PLATELET - Abnormal; Notable for the following components:   HCT 38.1 (*)    MCHC 37.0 (*)    RDW 17.3 (*)    Monocytes Absolute 1.2 (*)    All other components within normal limits  RETICULOCYTES - Abnormal; Notable for the following components:   Retic Ct Pct 4.0 (*)    Retic Count, Absolute 194.8 (*)    All other components within normal limits  INFLUENZA PANEL BY PCR (TYPE A & B)  I-STAT TROPONIN, ED    EKG  EKG Interpretation None       Radiology Dg Chest 2 View  Result Date: 04/22/2017 CLINICAL DATA:  42 y/o M; cough and chest pain for a couple hours. History of sickle cell disease, hypertension, asthma. EXAM: CHEST - 2 VIEW COMPARISON:  03/07/2016 chest radiograph. FINDINGS: Stable heart size and mediastinal contours are within normal limits. Both lungs are clear. The visualized skeletal structures are unremarkable. IMPRESSION: No acute pulmonary process identified. Electronically Signed   By: Mitzi Hansen M.D.   On: 04/22/2017 05:43    Procedures Procedures (including critical care  time)  Medications Ordered in ED Medications - No data to display   Initial Impression / Assessment and Plan / ED Course  I have reviewed the triage vital signs and the nursing notes.  Pertinent labs & imaging results that were available during my care of the patient were reviewed by me and considered in my medical decision making (see chart for details).  42 y.o. M here with sickle cell pain crisis.  Mostly pain in the back and legs which is typical.  Has had a slight cough, chills, and intermittent chest pain as well.  He is afebrile, non-toxic.  No acute deformities noted on  exam.  Lungs are clear.  Was recently exposed to uncle sick with flu.  Will send screening labs.  Will add CXR and flu.  EKG non-ischemic.  Labs overall reassuring.  CXR clear.  Patient with some slight hypoxia after second dose of dilaudid, improved with 2L supplemental O2.  He is awaiting subsequent doses if needed.  6:52 AM Patient is drowsy right now after 2 doses of medication.  Will need to wake up a little, reassess O2 sats.  If subsequent doses needed, can reduce down to 1mg  dilaudid.  Flu swab pending.  Care signed out to oncoming PA at this time.  Final Clinical Impressions(s) / ED Diagnoses   Final diagnoses:  Sickle cell pain crisis [facility])    ED Discharge Orders    None       Garlon Hatchet, PA-C 04/22/17 1610    Lorre Nick, MD 04/22/17 [phone]

## 2017-04-23 DIAGNOSIS — D57219 Sickle-cell/Hb-C disease with crisis, unspecified: Secondary | ICD-10-CM | POA: Diagnosis not present

## 2017-04-23 DIAGNOSIS — J45909 Unspecified asthma, uncomplicated: Secondary | ICD-10-CM | POA: Diagnosis present

## 2017-04-23 DIAGNOSIS — Z8249 Family history of ischemic heart disease and other diseases of the circulatory system: Secondary | ICD-10-CM | POA: Diagnosis not present

## 2017-04-23 DIAGNOSIS — J181 Lobar pneumonia, unspecified organism: Secondary | ICD-10-CM | POA: Diagnosis not present

## 2017-04-23 DIAGNOSIS — Z825 Family history of asthma and other chronic lower respiratory diseases: Secondary | ICD-10-CM | POA: Diagnosis not present

## 2017-04-23 DIAGNOSIS — G894 Chronic pain syndrome: Secondary | ICD-10-CM | POA: Diagnosis present

## 2017-04-23 DIAGNOSIS — R42 Dizziness and giddiness: Secondary | ICD-10-CM | POA: Diagnosis present

## 2017-04-23 DIAGNOSIS — Z79891 Long term (current) use of opiate analgesic: Secondary | ICD-10-CM | POA: Diagnosis not present

## 2017-04-23 DIAGNOSIS — Z832 Family history of diseases of the blood and blood-forming organs and certain disorders involving the immune mechanism: Secondary | ICD-10-CM | POA: Diagnosis not present

## 2017-04-23 DIAGNOSIS — D5701 Hb-SS disease with acute chest syndrome: Secondary | ICD-10-CM | POA: Diagnosis not present

## 2017-04-23 DIAGNOSIS — J189 Pneumonia, unspecified organism: Secondary | ICD-10-CM | POA: Diagnosis present

## 2017-04-23 DIAGNOSIS — D57211 Sickle-cell/Hb-C disease with acute chest syndrome: Secondary | ICD-10-CM | POA: Diagnosis present

## 2017-04-23 DIAGNOSIS — G4733 Obstructive sleep apnea (adult) (pediatric): Secondary | ICD-10-CM | POA: Diagnosis present

## 2017-04-23 DIAGNOSIS — D5700 Hb-SS disease with crisis, unspecified: Secondary | ICD-10-CM | POA: Diagnosis present

## 2017-04-23 DIAGNOSIS — R0902 Hypoxemia: Secondary | ICD-10-CM | POA: Diagnosis present

## 2017-04-23 DIAGNOSIS — Z833 Family history of diabetes mellitus: Secondary | ICD-10-CM | POA: Diagnosis not present

## 2017-04-23 DIAGNOSIS — I10 Essential (primary) hypertension: Secondary | ICD-10-CM | POA: Diagnosis present

## 2017-04-23 DIAGNOSIS — K219 Gastro-esophageal reflux disease without esophagitis: Secondary | ICD-10-CM | POA: Diagnosis present

## 2017-04-23 LAB — HIV ANTIBODY (ROUTINE TESTING W REFLEX): HIV Screen 4th Generation wRfx: NONREACTIVE

## 2017-04-23 MED ORDER — AMLODIPINE BESYLATE 10 MG PO TABS: 10.0000 mg | ORAL_TABLET | Freq: Every day | ORAL | 1 refills | Status: DC

## 2017-04-23 MED ORDER — OXYCODONE HCL 30 MG PO TABS: 30.0000 mg | ORAL_TABLET | ORAL | 0 refills | Status: DC | PRN

## 2017-04-23 MED ORDER — MORPHINE SULFATE ER 60 MG PO TBCR: 60.0000 mg | EXTENDED_RELEASE_TABLET | Freq: Two times a day (BID) | ORAL | 0 refills | Status: DC

## 2017-04-23 MED ORDER — CETIRIZINE HCL 10 MG PO TABS: 10.0000 mg | ORAL_TABLET | Freq: Every day | ORAL | 1 refills | Status: DC | PRN

## 2017-04-23 MED ORDER — HYDROMORPHONE HCL 1 MG/ML IJ SOLN
0.5000 mg | Freq: Once | INTRAMUSCULAR | Status: AC
  Administered 2017-04-23: 0.5 mg via INTRAVENOUS
  Filled 2017-04-23: qty 0.5

## 2017-04-23 MED ORDER — IBUPROFEN 800 MG PO TABS: 800.0000 mg | ORAL_TABLET | Freq: Three times a day (TID) | ORAL | 0 refills | Status: DC | PRN

## 2017-04-23 MED ORDER — OXYCODONE HCL 5 MG PO TABS
30.0000 mg | ORAL_TABLET | ORAL | Status: DC
  Administered 2017-04-23 (×2): 30 mg via ORAL
  Filled 2017-04-23 (×3): qty 6

## 2017-04-23 MED ORDER — ALBUTEROL SULFATE HFA 108 (90 BASE) MCG/ACT IN AERS: 2.0000 | INHALATION_SPRAY | Freq: Four times a day (QID) | RESPIRATORY_TRACT | 1 refills | Status: DC | PRN

## 2017-04-23 NOTE — Progress Notes (Signed)
Patient given PRN Zofran @0918  for nausea. Patient spit up phlegm that was witnessed. This RN reassessed patient. Patient stated that nausea was better. He stated that he had thrown up twice. Not witnessed and no evidence of vomit in room.

## 2017-04-23 NOTE — Progress Notes (Signed)
SICKLE CELL SERVICE PROGRESS NOTE  Dean LewisDonald R Markie [id number] DOB: [birth year] DOA: 04/22/2017 PCP: Billee CashingMcKenzie, [name], MD  Assessment/Plan: Active Problems:   Essential hypertension   Chronic prescription opiate use   Sickle-cell/Hb-C disease with pain (HCC)   OSA (obstructive sleep apnea)   Mild asthma without complication   Vertigo  1. Dizziness: Pt was being readied for discharge and then developed dizziness and dry heaves just before discharge. I have asked for Orthostatic vital signs to be done and Physician notified if orthostatic.  2. Hb [place] with Pain: Pain has improved to a level that is manageable with oral analgesics. Continue MS Contin and Oxycodone PRN.  3. HTN: BP Controlled on Norvasc. 4. OSA; Pt reports that he has OSA which is untreated. He wore the CPAP only part of the night. Will need a sleep study as out patient.   Code Status: Full Code Family Communication: N/A Disposition Plan: Change to Admission status  Jenaveve Fenstermaker A.  Pager [phone]. If 7PM-7AM, please contact night-coverage.  04/23/2017, 7:42 PM  LOS: 0 days   Interim History: Pt reported that his pain was 6/10 and localized only to right forehead where the strap for the CPAP mask was laying. Otherwise his pain is controlled. He was set for discharge but when he got  Up to ambulate so that his HR and Oxygen could be evaluated, he c/o dizziness and started having dry heaves. His discharge is held and his status is changed to in-patient.   Consultants:  None  Procedures:  None  Antibiotics:  None   Objective: Vitals:   04/23/17 0001 04/23/17 0433 04/23/17 1119 04/23/17 1600  BP: 136/89 138/65 116/77   Pulse: 64 (!) 52 72 (!) 53  Resp: 15 15 16 16   Temp: 98.4 F (36.9 C) 98.2 F (36.8 C) 98.6 F (37 C) 98.3 F (36.8 C)  TempSrc: Oral Oral Oral Oral  SpO2: 100% 98% 100%   Weight:      Height:       Weight change:   Intake/Output Summary (Last 24 hours) at 04/23/2017  1942 Last data filed at 04/23/2017 0630 Gross per 24 hour  Intake 1195.83 ml  Output 700 ml  Net 495.83 ml      Physical Exam General: Alert, awake, oriented x3, in no acute distress.  HEENT: [place]/AT PEERL, EOMI, anicteric Neck: Trachea midline,  no masses, no thyromegal,y no JVD, no carotid bruit OROPHARYNX:  Moist, No exudate/ erythema/lesions.  Heart: Regular rate and rhythm, without murmurs, rubs, gallops, PMI non-displaced, no heaves or thrills on palpation.  Lungs: Clear to auscultation, no wheezing or rhonchi noted. No increased vocal fremitus resonant to percussion  Abdomen: Soft, nontender, nondistended, positive bowel sounds, no masses no hepatosplenomegaly noted.  Neuro: No focal neurological deficits noted cranial nerves II through XII grossly intact. Strength at baseline in bilateral upper and lower extremities. Musculoskeletal: No warmth swelling or erythema around joints, no spinal tenderness noted. Psychiatric: Patient alert and oriented x3, good insight and cognition, good recent to remote recall.    Data Reviewed: Basic Metabolic Panel: Recent Labs  Lab 04/22/17 0525  NA 141  K 3.9  CL 107  CO2 22  GLUCOSE 111*  BUN 8  CREATININE 1.04  CALCIUM 10.9*   Liver Function Tests: Recent Labs  Lab 04/22/17 0525  AST 30  ALT 24  ALKPHOS 90  BILITOT 1.4*  PROT 7.9  ALBUMIN 4.7   No results for input(s): LIPASE, AMYLASE in the last 168 hours. No results  for input(s): AMMONIA in the last 168 hours. CBC: Recent Labs  Lab 04/22/17 0525  WBC 9.4  NEUTROABS 4.6  HGB 14.1  HCT 38.1*  MCV 78.2  PLT 307   Cardiac Enzymes: No results for input(s): CKTOTAL, CKMB, CKMBINDEX, TROPONINI in the last 168 hours. BNP (last 3 results) No results for input(s): BNP in the last 8760 hours.  ProBNP (last 3 results) No results for input(s): PROBNP in the last 8760 hours.  CBG: No results for input(s): GLUCAP in the last 168 hours.  No results found for this or  any previous visit (from the past 240 hour(s)).   Studies: Dg Chest 2 View  Result Date: 04/22/2017 CLINICAL DATA:  42 y/o M; cough and chest pain for a couple hours. History of sickle cell disease, hypertension, asthma. EXAM: CHEST - 2 VIEW COMPARISON:  03/07/2016 chest radiograph. FINDINGS: Stable heart size and mediastinal contours are within normal limits. Both lungs are clear. The visualized skeletal structures are unremarkable. IMPRESSION: No acute pulmonary process identified. Electronically Signed   By: Mitzi Hansen M.D.   On: 04/22/2017 05:43    Scheduled Meds: . amLODipine  10 mg Oral Daily  . enoxaparin (LOVENOX) injection  40 mg Subcutaneous Q24H  . ketorolac  30 mg Intravenous Q6H  . loratadine  10 mg Oral Daily  . morphine  60 mg Oral Q12H  . oxyCODONE  30 mg Oral Q4H  . senna-docusate  1 tablet Oral BID   Continuous Infusions: . sodium chloride 125 mL/hr at 04/23/17 0630    Active Problems:   Essential hypertension   Chronic prescription opiate use   Sickle-cell/Hb-C disease with pain (HCC)   OSA (obstructive sleep apnea)   Mild asthma without complication   Vertigo     In excess of 40 minutes spent during this visit. Greater than 50% involved face to face contact with the patient for assessment, counseling and coordination of care.

## 2017-04-24 ENCOUNTER — Inpatient Hospital Stay (HOSPITAL_COMMUNITY): Payer: Medicaid Other

## 2017-04-24 DIAGNOSIS — J189 Pneumonia, unspecified organism: Secondary | ICD-10-CM | POA: Diagnosis not present

## 2017-04-24 DIAGNOSIS — R0902 Hypoxemia: Secondary | ICD-10-CM | POA: Diagnosis not present

## 2017-04-24 DIAGNOSIS — D5701 Hb-SS disease with acute chest syndrome: Secondary | ICD-10-CM | POA: Diagnosis not present

## 2017-04-24 LAB — BASIC METABOLIC PANEL
ANION GAP: 12 (ref 5–15)
BUN: 15 mg/dL (ref 6–20)
CHLORIDE: 99 mmol/L — AB (ref 101–111)
CO2: 26 mmol/L (ref 22–32)
Calcium: 10.1 mg/dL (ref 8.9–10.3)
Creatinine, Ser: 1.23 mg/dL (ref 0.61–1.24)
GFR calc non Af Amer: >60 mL/min (ref >60)
Glucose, Bld: 92 mg/dL (ref 65–99)
POTASSIUM: 4.6 mmol/L (ref 3.5–5.1)
SODIUM: 137 mmol/L (ref 135–145)

## 2017-04-24 LAB — CBC WITH DIFFERENTIAL/PLATELET
BASOS ABS: 0 10*3/uL (ref 0.0–0.1)
BASOS PCT: 0 %
EOS PCT: 0 %
Eosinophils Absolute: 0 10*3/uL (ref 0.0–0.7)
HEMATOCRIT: 34.9 % — AB (ref 39.0–52.0)
HEMOGLOBIN: 13 g/dL (ref 13.0–17.0)
LYMPHS PCT: 3 %
Lymphs Abs: 0.8 10*3/uL (ref 0.7–4.0)
MCH: 29.3 pg (ref 26.0–34.0)
MCHC: 37.2 g/dL — ABNORMAL HIGH (ref 30.0–36.0)
MCV: 78.8 fL (ref 78.0–100.0)
MONOS PCT: 8 %
Monocytes Absolute: 2 10*3/uL — ABNORMAL HIGH (ref 0.1–1.0)
NEUTROS PCT: 89 %
Neutro Abs: 22.8 10*3/uL — ABNORMAL HIGH (ref 1.7–7.7)
Platelets: 228 10*3/uL (ref 150–400)
RBC: 4.43 MIL/uL (ref 4.22–5.81)
RDW: 18.4 % — ABNORMAL HIGH (ref 11.5–15.5)
WBC MORPHOLOGY: INCREASED
WBC: 25.6 10*3/uL — AB (ref 4.0–10.5)
nRBC: 3 /100 WBC — ABNORMAL HIGH

## 2017-04-24 LAB — BLOOD GAS, ARTERIAL
ACID-BASE EXCESS: 2.1 mmol/L — AB (ref 0.0–2.0)
Bicarbonate: 26.1 mmol/L (ref 20.0–28.0)
DRAWN BY: 295031
O2 Content: 2 L/min
O2 SAT: 90.4 %
PATIENT TEMPERATURE: 37
PH ART: 7.427 (ref 7.350–7.450)
pCO2 arterial: 40.3 mmHg (ref 32.0–48.0)
pO2, Arterial: 63 mmHg — ABNORMAL LOW (ref 83.0–108.0)

## 2017-04-24 LAB — PREPARE RBC (CROSSMATCH)

## 2017-04-24 LAB — LACTIC ACID, PLASMA: LACTIC ACID, VENOUS: 2.3 mmol/L — AB (ref 0.5–1.9)

## 2017-04-24 MED ORDER — DIPHENHYDRAMINE HCL 25 MG PO CAPS
25.0000 mg | ORAL_CAPSULE | Freq: Once | ORAL | Status: AC
  Administered 2017-04-24: 25 mg via ORAL
  Filled 2017-04-24: qty 1

## 2017-04-24 MED ORDER — MORPHINE SULFATE ER 30 MG PO TBCR
30.0000 mg | EXTENDED_RELEASE_TABLET | Freq: Two times a day (BID) | ORAL | Status: DC
  Administered 2017-04-24 – 2017-04-26 (×4): 30 mg via ORAL
  Filled 2017-04-24 (×5): qty 1

## 2017-04-24 MED ORDER — OXYCODONE HCL 5 MG PO TABS
30.0000 mg | ORAL_TABLET | Freq: Four times a day (QID) | ORAL | Status: DC | PRN
  Administered 2017-04-25: 30 mg via ORAL
  Filled 2017-04-24: qty 6

## 2017-04-24 MED ORDER — SODIUM CHLORIDE 0.9 % IV SOLN
1.0000 g | INTRAVENOUS | Status: DC
  Administered 2017-04-24 – 2017-04-25 (×2): 1 g via INTRAVENOUS
  Filled 2017-04-24 (×2): qty 1

## 2017-04-24 MED ORDER — SODIUM CHLORIDE 0.9 % IV SOLN: Freq: Once | INTRAVENOUS | Status: DC

## 2017-04-24 MED ORDER — ACETAMINOPHEN 325 MG PO TABS
650.0000 mg | ORAL_TABLET | ORAL | Status: DC | PRN
  Administered 2017-04-24: 650 mg via ORAL
  Filled 2017-04-24: qty 2

## 2017-04-24 MED ORDER — SODIUM CHLORIDE 0.9 % IV SOLN
500.0000 mg | INTRAVENOUS | Status: DC
  Administered 2017-04-24 – 2017-04-25 (×2): 500 mg via INTRAVENOUS
  Filled 2017-04-24 (×2): qty 500

## 2017-04-24 MED ORDER — SODIUM CHLORIDE 0.9 % IV SOLN
Freq: Once | INTRAVENOUS | Status: AC
  Administered 2017-04-24: 18:00:00 via INTRAVENOUS

## 2017-04-24 NOTE — Progress Notes (Signed)
SICKLE CELL SERVICE PROGRESS NOTE  Dean LewisDonald R Webb ZOX:[id number] DOB: 09/20/1975 DOA: 04/22/2017 PCP: Billee CashingMcKenzie, [name], MD  Assessment/Plan: Active Problems:   Essential hypertension   Chronic prescription opiate use   Sickle-cell/Hb-C disease with pain (HCC)   OSA (obstructive sleep apnea)   Mild asthma without complication   Vertigo  1. Hypoxia: No obvious cause. Will obtain a CXR and if negative will consider CTA. 2. Dizziness: Pt again has dizziness upon standing. Pt somewhat drowsy. Will also check electrolytes and renal function to ensure no abnormalities contributing to symptoms.  3. HTN: Adequately controlled with Antihypertensive medication.  4. Hb SS with Pain: Pain controlled with current medications.   Code Status: Full Code Family Communication: N/A Disposition Plan: Not yet ready for discharge  MATTHEWS,MICHELLE A.  Pager [phone]. If 7PM-7AM, please contact night-coverage.  04/24/2017, 10:49 AM  LOS: 1 day   Interim History: Pt reports pain controlled with current medications. He is somnolent but easily arousable. He c/o dizziness with standing.   Consultants:  None  Procedures:  None  Antibiotics:  None   Objective: Vitals:   04/23/17 2143 04/24/17 0630 04/24/17 1020 04/24/17 1022  BP: 122/73 121/77    Pulse: 75 64    Resp: 15 16    Temp: 98.9 F (37.2 C) 98.4 F (36.9 C)    TempSrc: Oral Axillary    SpO2: 97% 98% (!) 81% 90%  Weight:      Height:       Weight change:   Intake/Output Summary (Last 24 hours) at 04/24/2017 1049 Last data filed at 04/24/2017 0631 Gross per 24 hour  Intake 2560 ml  Output 300 ml  Net 2260 ml      Physical Exam General: Sleepy but arousable. Alert, awake, oriented x3, in no acute distress.  HEENT: [place]/AT PEERL, EOMI, anicteric Neck: Trachea midline,  no masses, no thyromegal,y no JVD, no carotid bruit OROPHARYNX:  Moist, No exudate/ erythema/lesions.  Heart: Regular rate and rhythm, without murmurs,  rubs, gallops, PMI non-displaced, no heaves or thrills on palpation.  Lungs: Clear to auscultation, no wheezing or rhonchi noted. No increased vocal fremitus resonant to percussion  Abdomen: Soft, nontender, nondistended, positive bowel sounds, no masses no hepatosplenomegaly noted.  Neuro: No focal neurological deficits noted cranial nerves II through XII grossly intact.  Strength at baseline in bilateral upper and lower extremities. Musculoskeletal: No warmth swelling or erythema around joints, no spinal tenderness noted. Psychiatric: Patient alert and oriented x3, good insight and cognition, good recent to remote recall.    Data Reviewed: Basic Metabolic Panel: Recent Labs  Lab 04/22/17 0525  NA 141  K 3.9  CL 107  CO2 22  GLUCOSE 111*  BUN 8  CREATININE 1.04  CALCIUM 10.9*   Liver Function Tests: Recent Labs  Lab 04/22/17 0525  AST 30  ALT 24  ALKPHOS 90  BILITOT 1.4*  PROT 7.9  ALBUMIN 4.7   No results for input(s): LIPASE, AMYLASE in the last 168 hours. No results for input(s): AMMONIA in the last 168 hours. CBC: Recent Labs  Lab 04/22/17 0525  WBC 9.4  NEUTROABS 4.6  HGB 14.1  HCT 38.1*  MCV 78.2  PLT 307   Cardiac Enzymes: No results for input(s): CKTOTAL, CKMB, CKMBINDEX, TROPONINI in the last 168 hours. BNP (last 3 results) No results for input(s): BNP in the last 8760 hours.  ProBNP (last 3 results) No results for input(s): PROBNP in the last 8760 hours.  CBG: No results for input(s):  GLUCAP in the last 168 hours.  No results found for this or any previous visit (from the past 240 hour(s)).   Studies: Dg Chest 2 View  Result Date: 04/22/2017 CLINICAL DATA:  42 y/o M; cough and chest pain for a couple hours. History of sickle cell disease, hypertension, asthma. EXAM: CHEST - 2 VIEW COMPARISON:  03/07/2016 chest radiograph. FINDINGS: Stable heart size and mediastinal contours are within normal limits. Both lungs are clear. The visualized  skeletal structures are unremarkable. IMPRESSION: No acute pulmonary process identified. Electronically Signed   By: Mitzi Hansen M.D.   On: 04/22/2017 05:43    Scheduled Meds: . amLODipine  10 mg Oral Daily  . enoxaparin (LOVENOX) injection  40 mg Subcutaneous Q24H  . ketorolac  30 mg Intravenous Q6H  . loratadine  10 mg Oral Daily  . morphine  60 mg Oral Q12H  . senna-docusate  1 tablet Oral BID   Continuous Infusions: . sodium chloride 125 mL/hr at 04/24/17 0006    Active Problems:   Essential hypertension   Chronic prescription opiate use   Sickle-cell/Hb-C disease with pain (HCC)   OSA (obstructive sleep apnea)   Mild asthma without complication   Vertigo     In excess of 40 minutes spent during this visit. Greater than 50% involved face to face contact with the patient for assessment, counseling and coordination of care.

## 2017-04-24 NOTE — Progress Notes (Signed)
Blood exchange ordered ; 750 ml blood pulled off PIV from left AC per order prior to blood administration .

## 2017-04-24 NOTE — Progress Notes (Addendum)
Patient ID: Dean ReeksDonald R Webb, male   DOB: [birth year], 42 y.o.   MRN: [id number] Reviewed CXR which shows consolidation. However he has no cough, fever or any clinical constellation of symptoms. Will T&C as this is more likely Acute Chest Syndrome. However given the tachycardia and Hypoxia will go ahead and start on antibiotics for CAP.   Dean Webb A.  Addendum: 2:27 PM  Reviewed labs. Lactic acid elevated , WBC elevated and Cr increased. Will remove 750 ml of blood and transfuse 1 unit RBC's. Also reviewed ABG and increased Oxygen to 4 L/min with continuous pulse oximetry.   Dean Webb A.

## 2017-04-24 NOTE — Progress Notes (Signed)
Pt pre transfusion temperature 101F, MD paged, orders received. Justin Mendaudle, Holland Kotter H, RN

## 2017-04-25 ENCOUNTER — Encounter: Payer: Self-pay | Admitting: Internal Medicine

## 2017-04-25 DIAGNOSIS — D5701 Hb-SS disease with acute chest syndrome: Secondary | ICD-10-CM

## 2017-04-25 DIAGNOSIS — J189 Pneumonia, unspecified organism: Secondary | ICD-10-CM

## 2017-04-25 LAB — CBC WITH DIFFERENTIAL/PLATELET
BAND NEUTROPHILS: 0 %
BASOS ABS: 0 10*3/uL (ref 0.0–0.1)
BASOS PCT: 0 %
BLASTS: 0 %
Basophils Absolute: 0 10*3/uL (ref 0.0–0.1)
Basophils Relative: 0 %
EOS ABS: 0 10*3/uL (ref 0.0–0.7)
EOS PCT: 0 %
Eosinophils Absolute: 0 10*3/uL (ref 0.0–0.7)
Eosinophils Relative: 0 %
HCT: 28.7 % — ABNORMAL LOW (ref 39.0–52.0)
HEMATOCRIT: 30.4 % — AB (ref 39.0–52.0)
HEMOGLOBIN: 10.6 g/dL — AB (ref 13.0–17.0)
Hemoglobin: 11.2 g/dL — ABNORMAL LOW (ref 13.0–17.0)
LYMPHS PCT: 12 %
LYMPHS PCT: 8 %
Lymphs Abs: 4.8 10*3/uL — ABNORMAL HIGH (ref 0.7–4.0)
Lymphs Abs: 2.9 10*3/uL (ref 0.7–4.0)
MCH: 28.8 pg (ref 26.0–34.0)
MCH: 29.3 pg (ref 26.0–34.0)
MCHC: 36.8 g/dL — ABNORMAL HIGH (ref 30.0–36.0)
MCHC: 36.9 g/dL — ABNORMAL HIGH (ref 30.0–36.0)
MCV: 78.1 fL (ref 78.0–100.0)
MCV: 79.3 fL (ref 78.0–100.0)
METAMYELOCYTES PCT: 0 %
MONO ABS: 2.4 10*3/uL — AB (ref 0.1–1.0)
MYELOCYTES: 0 %
Monocytes Absolute: 1.8 10*3/uL — ABNORMAL HIGH (ref 0.1–1.0)
Monocytes Relative: 6 %
Monocytes Relative: 5 %
NEUTROS PCT: 82 %
Neutro Abs: 32.9 10*3/uL — ABNORMAL HIGH (ref 1.7–7.7)
Neutro Abs: 31.5 10*3/uL — ABNORMAL HIGH (ref 1.7–7.7)
Neutrophils Relative %: 87 %
Other: 0 %
Platelets: 212 10*3/uL (ref 150–400)
Platelets: 199 10*3/uL (ref 150–400)
Promyelocytes Absolute: 0 %
RBC: 3.89 MIL/uL — ABNORMAL LOW (ref 4.22–5.81)
RBC: 3.62 MIL/uL — ABNORMAL LOW (ref 4.22–5.81)
RDW: 18.2 % — AB (ref 11.5–15.5)
RDW: 17.7 % — ABNORMAL HIGH (ref 11.5–15.5)
WBC: 40.1 10*3/uL — ABNORMAL HIGH (ref 4.0–10.5)
WBC: 36.2 10*3/uL — ABNORMAL HIGH (ref 4.0–10.5)
nRBC: 2 /100 WBC — ABNORMAL HIGH
nRBC: 0 /100 WBC

## 2017-04-25 LAB — BASIC METABOLIC PANEL
ANION GAP: 8 (ref 5–15)
BUN: 13 mg/dL (ref 6–20)
CALCIUM: 10 mg/dL (ref 8.9–10.3)
CO2: 28 mmol/L (ref 22–32)
Chloride: 102 mmol/L (ref 101–111)
Creatinine, Ser: 1.22 mg/dL (ref 0.61–1.24)
GFR calc Af Amer: >60 mL/min (ref >60)
GLUCOSE: 147 mg/dL — AB (ref 65–99)
Potassium: 3.8 mmol/L (ref 3.5–5.1)
Sodium: 138 mmol/L (ref 135–145)

## 2017-04-25 LAB — RETICULOCYTES
RBC.: 3.89 MIL/uL — ABNORMAL LOW (ref 4.22–5.81)
Retic Count, Absolute: 175.1 10*3/uL (ref 19.0–186.0)
Retic Ct Pct: 4.5 % — ABNORMAL HIGH (ref 0.4–3.1)

## 2017-04-25 LAB — PROCALCITONIN: PROCALCITONIN: 10.95 ng/mL

## 2017-04-25 LAB — STREP PNEUMONIAE URINARY ANTIGEN: STREP PNEUMO URINARY ANTIGEN: POSITIVE — AB

## 2017-04-25 MED ORDER — LEVOFLOXACIN 750 MG PO TABS: 750.0000 mg | ORAL_TABLET | Freq: Every day | ORAL | Status: DC

## 2017-04-25 MED ORDER — FUROSEMIDE 10 MG/ML IJ SOLN
40.0000 mg | Freq: Once | INTRAMUSCULAR | Status: AC
Start: 2017-04-25 — End: 2017-04-25
  Administered 2017-04-25: 40 mg via INTRAVENOUS
  Filled 2017-04-25: qty 4

## 2017-04-25 NOTE — Progress Notes (Addendum)
SICKLE CELL SERVICE PROGRESS NOTE  Dhanvin Szeto [place] ZOX:[id number] DOB: [birth year] DOA: 04/22/2017 PCP: Billee Cashing, MD  Assessment/Plan: Active Problems:   Essential hypertension   Chronic prescription opiate use   Sickle-cell/Hb-C disease with pain (HCC)   OSA (obstructive sleep apnea)   Mild asthma without complication   Vertigo  1. Acute Chest Syndrome: Pt s/p partial exchange transfusion with significant improvement of Oxygenation. Also continue on antibiotics for CAP. 2. CAP: Pt on day #2 of antibiotics. Will change to oral Levaquin after today. 3. Hypoxia: Secondary to Acute Chest Syndrome and Pneumonia. Significantly improved. Wean to RA.  4. Mild increase in Cr: Likely due to fluid overload. Will recheck tomorrow after lasix.  5. Dizziness: Resolved after treatment of Acute Chest Syndrome.  6. HTN: mildly elevated and pt with excessive fluid. Will give a single dose of Lasix and continue Amlodipine.  7. Hb SS with Pain: Pain controlled with current medications. Continue MS Contin at 30 mg and Oxycodone on a PRN basis.   Code Status: Full Code Family Communication: N/A Disposition Plan: Anticipate discharge tomorrow.   Harolyn Cocker A.  Pager [phone]. If 7PM-7AM, please contact night-coverage.  04/25/2017, 5:56 PM  LOS: 2 days   Interim History: Pt reports pain controlled with current medications. He is alert and oriented x 3 and states that he feels much better today  Consultants:  None  Procedures:  None  Antibiotics:  Azithromycin 3/12 >>3/13  Ceftriaxone 3/12/>>3/13  Levaquin 3/14 >>   Objective: Vitals:   04/25/17 1210 04/25/17 1222 04/25/17 1500 04/25/17 1700  BP:   133/75 119/62  Pulse:   70 100  Resp:   14 16  Temp:   98.8 F (37.1 C) 98.8 F (37.1 C)  TempSrc:   Oral Oral  SpO2: 91% 93% 94% 95%  Weight:      Height:       Weight change:   Intake/Output Summary (Last 24 hours) at 04/25/2017 1756 Last data filed at 04/25/2017  1432 Gross per 24 hour  Intake 836.42 ml  Output 1675 ml  Net -838.58 ml      Physical Exam General:  Alert, awake, oriented x3, in no acute distress.  HEENT: [place]/AT PEERL, EOMI, anicteric Neck: Trachea midline,  no masses, no thyromegal,y no JVD, no carotid bruit OROPHARYNX:  Moist, No exudate/ erythema/lesions.  Heart: Regular rate and rhythm, without murmurs, rubs, gallops, PMI non-displaced, no heaves or thrills on palpation.  Lungs: Crackle noted at bases. No wheezing or rhonchi noted. No increased vocal fremitus resonant to percussion  Abdomen: Soft, nontender, nondistended, positive bowel sounds, no masses no hepatosplenomegaly noted.  Neuro: No focal neurological deficits noted cranial nerves II through XII grossly intact.  Strength at baseline in bilateral upper and lower extremities. Musculoskeletal: No warmth swelling or erythema around joints, no spinal tenderness noted. Psychiatric: Patient alert and oriented x3, good insight and cognition, good recent to remote recall.    Data Reviewed: Basic Metabolic Panel: Recent Labs  Lab 04/22/17 0525 04/24/17 1228 04/25/17 0936  NA 141 137 138  K 3.9 4.6 3.8  CL 107 99* 102  CO2 22 26 28   GLUCOSE 111* 92 147*  BUN 8 15 13   CREATININE 1.04 1.23 1.22  CALCIUM 10.9* 10.1 10.0   Liver Function Tests: Recent Labs  Lab 04/22/17 0525  AST 30  ALT 24  ALKPHOS 90  BILITOT 1.4*  PROT 7.9  ALBUMIN 4.7   No results for input(s): LIPASE, AMYLASE in the last 168  hours. No results for input(s): AMMONIA in the last 168 hours. CBC: Recent Labs  Lab 04/22/17 0525 04/24/17 1228 04/25/17 0936  WBC 9.4 25.6* 40.1*  NEUTROABS 4.6 22.8* 32.9*  HGB 14.1 13.0 11.2*  HCT 38.1* 34.9* 30.4*  MCV 78.2 78.8 78.1  PLT 307 228 212   Cardiac Enzymes: No results for input(s): CKTOTAL, CKMB, CKMBINDEX, TROPONINI in the last 168 hours. BNP (last 3 results) No results for input(s): BNP in the last 8760 hours.  ProBNP (last 3  results) No results for input(s): PROBNP in the last 8760 hours.  CBG: No results for input(s): GLUCAP in the last 168 hours.  Recent Results (from the past 240 hour(s))  Culture, blood (routine x 2) Call MD if unable to obtain prior to antibiotics being given     Status: None (Preliminary result)   Collection Time: 04/24/17 12:39 PM  Result Value Ref Range Status   Specimen Description   Final    BLOOD LEFT ARM Performed at [facility], [address] [place], [place] [address]    Special Requests   Final    BOTTLES DRAWN AEROBIC AND ANAEROBIC Blood Culture adequate volume Performed at [facility], [address] [place], [place] [address]    Culture   Final    NO GROWTH 1 DAY Performed at [facility], [address] [place], [place] [address]    Report Status PENDING  Incomplete  Culture, blood (routine x 2) Call MD if unable to obtain prior to antibiotics being given     Status: None (Preliminary result)   Collection Time: 04/24/17  1:02 PM  Result Value Ref Range Status   Specimen Description   Final    BLOOD RIGHT HAND Performed at [facility], [address] [place], [place] [address]    Special Requests   Final    BOTTLES DRAWN AEROBIC AND ANAEROBIC Blood Culture adequate volume Performed at [facility], [address] [place], [place] [address]    Culture   Final    NO GROWTH 1 DAY Performed at [facility], [address] [place], [place] [address]    Report Status PENDING  Incomplete     Studies: Dg Chest 2 View  Result Date: 04/24/2017 CLINICAL DATA:  Hypoxia.  History of sickle cell disease EXAM: CHEST - 2 VIEW COMPARISON:  April 22, 2017 FINDINGS: There is airspace consolidation throughout the right lower lobe. Lungs elsewhere are clear. Heart is upper normal in size with pulmonary vascularity within normal limits. No adenopathy. There is an old healed fracture  of the right clavicle. There is thoracolumbar levoscoliosis. Gallbladder is absent. No focal bone lesions evident. IMPRESSION: Extensive airspace consolidation consistent with pneumonia right lower lobe. Lungs elsewhere clear. Heart upper normal in size. No vascular congestion or adenopathy evident. Followup PA and lateral chest radiographs recommended in 3-4 weeks following trial of antibiotic therapy to ensure resolution and exclude underlying malignancy. Electronically Signed   By: Bretta BangWilliam  Woodruff III M.D.   On: 04/24/2017 11:48   Dg Chest 2 View  Result Date: 04/22/2017 CLINICAL DATA:  42 y/o M; cough and chest pain for a couple hours. History of sickle cell disease, hypertension, asthma. EXAM: CHEST - 2 VIEW COMPARISON:  03/07/2016 chest radiograph. FINDINGS: Stable heart size and mediastinal contours are within normal limits. Both lungs are clear. The visualized skeletal structures are unremarkable. IMPRESSION: No acute pulmonary process identified. Electronically Signed  By: Mitzi Hansen M.D.   On: 04/22/2017 05:43    Scheduled Meds: . amLODipine  10 mg Oral Daily  . enoxaparin (LOVENOX) injection  40 mg Subcutaneous Q24H  . ketorolac  30 mg Intravenous Q6H  . loratadine  10 mg Oral Daily  . morphine  30 mg Oral Q12H  . senna-docusate  1 tablet Oral BID   Continuous Infusions: . sodium chloride 10 mL/hr at 04/25/17 1231  . sodium chloride    . sodium chloride    . azithromycin 500 mg (04/25/17 1533)  . cefTRIAXone (ROCEPHIN)  IV 1 g (04/25/17 1432)    Active Problems:   Essential hypertension   Chronic prescription opiate use   Sickle-cell/Hb-C disease with pain (HCC)   OSA (obstructive sleep apnea)   Mild asthma without complication   Vertigo     In excess of 35 minutes spent during this visit. Greater than 50% involved face to face contact with the patient for assessment, counseling and coordination of care.   Addendum: WBC count more elevated today at  40K. His clinical condition however appears to be improved. Will re-check CBC with diff tonight. Continue Oral Levaquin.   Jaran Sainz A.

## 2017-04-25 NOTE — Progress Notes (Signed)
Patient ambulated in hall.   On room air prior to walking patient was 91 percent.  After walking patient was 93 percent on room air.

## 2017-04-26 DIAGNOSIS — J181 Lobar pneumonia, unspecified organism: Secondary | ICD-10-CM

## 2017-04-26 LAB — CBC WITH DIFFERENTIAL/PLATELET
BLASTS: 0 %
Band Neutrophils: 0 %
Basophils Absolute: 0 10*3/uL (ref 0.0–0.1)
Basophils Relative: 0 %
Eosinophils Absolute: 0 10*3/uL (ref 0.0–0.7)
Eosinophils Relative: 0 %
HEMATOCRIT: 29.6 % — AB (ref 39.0–52.0)
HEMOGLOBIN: 10.7 g/dL — AB (ref 13.0–17.0)
Lymphocytes Relative: 12 %
Lymphs Abs: 4.1 10*3/uL — ABNORMAL HIGH (ref 0.7–4.0)
MCH: 28.6 pg (ref 26.0–34.0)
MCHC: 36.1 g/dL — AB (ref 30.0–36.0)
MCV: 79.1 fL (ref 78.0–100.0)
MONOS PCT: 6 %
Metamyelocytes Relative: 0 %
Monocytes Absolute: 2 10*3/uL — ABNORMAL HIGH (ref 0.1–1.0)
Myelocytes: 0 %
NEUTROS ABS: 28 10*3/uL — AB (ref 1.7–7.7)
NEUTROS PCT: 82 %
NRBC: 0 /100{WBCs}
OTHER: 0 %
Platelets: 225 10*3/uL (ref 150–400)
Promyelocytes Absolute: 0 %
RBC: 3.74 MIL/uL — AB (ref 4.22–5.81)
RDW: 17.2 % — ABNORMAL HIGH (ref 11.5–15.5)
WBC: 34.1 10*3/uL — AB (ref 4.0–10.5)

## 2017-04-26 LAB — HEMOGLOBINOPATHY EVALUATION
HGB A2 QUANT: 3.2 % (ref 1.8–3.2)
Hgb A: 0 % — ABNORMAL LOW (ref 96.4–98.8)
Hgb C: 43.2 % — ABNORMAL HIGH
Hgb F Quant: 9.6 % — ABNORMAL HIGH (ref 0.0–2.0)
Hgb S Quant: 44 % — ABNORMAL HIGH
Hgb Variant: 0 %

## 2017-04-26 LAB — RETICULOCYTES
RBC.: 3.74 MIL/uL — ABNORMAL LOW (ref 4.22–5.81)
Retic Count, Absolute: 213.2 10*3/uL — ABNORMAL HIGH (ref 19.0–186.0)
Retic Ct Pct: 5.7 % — ABNORMAL HIGH (ref 0.4–3.1)

## 2017-04-26 LAB — BASIC METABOLIC PANEL
ANION GAP: 8 (ref 5–15)
BUN: 9 mg/dL (ref 6–20)
CO2: 27 mmol/L (ref 22–32)
Calcium: 9.8 mg/dL (ref 8.9–10.3)
Chloride: 102 mmol/L (ref 101–111)
Creatinine, Ser: 1.01 mg/dL (ref 0.61–1.24)
GFR calc Af Amer: >60 mL/min (ref >60)
GLUCOSE: 91 mg/dL (ref 65–99)
Potassium: 4 mmol/L (ref 3.5–5.1)
SODIUM: 137 mmol/L (ref 135–145)

## 2017-04-26 LAB — PROCALCITONIN: PROCALCITONIN: 8.72 ng/mL

## 2017-04-26 MED ORDER — SODIUM CHLORIDE 0.9 % IV SOLN
1.0000 g | INTRAVENOUS | Status: DC
  Filled 2017-04-26: qty 10

## 2017-04-26 MED ORDER — SODIUM CHLORIDE 0.9 % IV SOLN
500.0000 mg | INTRAVENOUS | Status: DC
  Filled 2017-04-26: qty 500

## 2017-04-26 MED ORDER — LEVOFLOXACIN 750 MG PO TABS: 750.0000 mg | ORAL_TABLET | Freq: Every day | ORAL | 0 refills | Status: DC

## 2017-04-26 NOTE — Discharge Summary (Signed)
Physician Discharge Summary  Patient ID: Dean Webb MRN: [id number] DOB/AGE: [birth year] 41 y.o.  Admit date: 04/22/2017 Discharge date: 04/26/2017  Admission Diagnoses:  Discharge Diagnoses:  Active Problems:   Essential hypertension   Chronic prescription opiate use   OSA (obstructive sleep apnea)   Mild asthma without complication   Acute chest syndrome (HCC)   CAP (community acquired pneumonia)   Discharged Condition: fair  Hospital Course: patient admitted with community-acquired pneumonia and acute chest syndrome secondary to sickle cell crisis. Patient has done much better with IV Levaquin as well as Dilaudid PCA with Toradol and IV fluids.  Patient has been gradually transitioned to oral Levaquin.  He has received prescription for his home regimen of pain medications until he is seen by his primary care physician.  He was hypoxic on admission and has received partial exchange blood transfusion. He has improved and wants to go home.  He has other chronic medical problems that were treated as well.  Consults: None  Significant Diagnostic Studies: labs: CBC, CMP, chest x-ray ordered  Treatments: IV hydration, antibiotics: Levaquin and analgesia: acetaminophen and Dilaudid  Discharge Exam: Blood pressure 135/84, pulse 84, temperature 98.2 F (36.8 C), temperature source Oral, resp. rate 15, height 5\' 7"  (1.702 m), weight 89.4 kg (197 lb), SpO2 95 %. General appearance: alert, cooperative, appears stated age and no distress Neck: no adenopathy, no carotid bruit, no JVD, supple, symmetrical, trachea midline and thyroid not enlarged, symmetric, no tenderness/mass/nodules Resp: clear to auscultation bilaterally Cardio: regular rate and rhythm, S1, S2 normal, no murmur, click, rub or gallop Male genitalia: normal Extremities: extremities normal, atraumatic, no cyanosis or edema Pulses: 2+ and symmetric  Disposition: 07-Left Against Medical Advice/Left Without Being  Seen/Elopement  Discharge Instructions    Diet - low sodium heart healthy   Complete by:  As directed    Increase activity slowly   Complete by:  As directed      Allergies as of 04/26/2017   No Known Allergies     Medication List    TAKE these medications   albuterol 108 (90 Base) MCG/ACT inhaler Commonly known as:  PROVENTIL HFA;VENTOLIN HFA Inhale 2 puffs into the lungs every 6 (six) hours as needed for wheezing or shortness of breath.   amLODipine 10 MG tablet Commonly known as:  NORVASC Take 1 tablet (10 mg total) by mouth daily.   cetirizine 10 MG tablet Commonly known as:  ZYRTEC Take 1 tablet (10 mg total) by mouth daily as needed for allergies.   ibuprofen 800 MG tablet Commonly known as:  ADVIL,MOTRIN Take 1 tablet (800 mg total) by mouth every 8 (eight) hours as needed.   levofloxacin 750 MG tablet Commonly known as:  LEVAQUIN Take 1 tablet (750 mg total) by mouth daily for 7 days.   morphine 60 MG 12 hr tablet Commonly known as:  MS CONTIN Take 1 tablet (60 mg total) by mouth every 12 (twelve) hours.   oxycodone 30 MG immediate release tablet Commonly known as:  ROXICODONE Take 1 tablet (30 mg total) by mouth every 4 (four) hours as needed for pain. Pt taking differently: 30 mg BID What changed:    when to take this  reasons to take this        Signed: Gaege Sangalang,LAWAL 04/26/2017, 9:47 AM  Time spent 33 min.

## 2017-04-26 NOTE — Progress Notes (Signed)
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  He is doing much better on Dilaudid PCA with Toradol.  Pain is a 7/10.  No fever or chills.  White count remains in the 30s.  Objective: Vital signs in last 24 hours: Temp:  [98.7 F (37.1 C)-99.3 F (37.4 C)] 99.2 F (37.3 C) (03/14 0400) Pulse Rate:  [70-103] 98 (03/14 0400) Resp:  [13-16] 13 (03/14 0400) BP: (107-156)/(62-93) 154/93 (03/14 0400) SpO2:  [91 %-98 %] 95 % (03/14 0400) Weight change:  Last BM Date: 04/21/17  Intake/Output from previous day: 03/13 0701 - 03/14 0700 In: -  Out: 1425 [Urine:875; Blood:550] Intake/Output this shift: No intake/output data recorded.  General appearance: alert, cooperative, appears stated age and no distress Neck: no adenopathy, no carotid bruit, no JVD, supple, symmetrical, trachea midline and thyroid not enlarged, symmetric, no tenderness/mass/nodules Resp: diminished breath sounds bibasilar and rales bilaterally Cardio: regular rate and rhythm, S1, S2 normal, no murmur, click, rub or gallop GI: soft, non-tender; bowel sounds normal; no masses,  no organomegaly Extremities: extremities normal, atraumatic, no cyanosis or edema Pulses: 2+ and symmetric Skin: Skin color, texture, turgor normal. No rashes or lesions Neurologic: Grossly normal  Lab Results: Recent Labs    04/25/17 1938 04/26/17 0622  WBC 36.2* 34.1*  HGB 10.6* 10.7*  HCT 28.7* 29.6*  PLT 199 225   BMET Recent Labs    04/25/17 0936 04/26/17 0622  NA 138 137  K 3.8 4.0  CL 102 102  CO2 28 27  GLUCOSE 147* 91  BUN 13 9  CREATININE 1.22 1.01  CALCIUM 10.0 9.8    Studies/Results: Dg Chest 2 View  Result Date: 04/24/2017 CLINICAL DATA:  Hypoxia.  History of sickle cell disease EXAM: CHEST - 2 VIEW COMPARISON:  April 22, 2017 FINDINGS: There is airspace consolidation throughout the right lower lobe. Lungs elsewhere are clear.  Heart is upper normal in size with pulmonary vascularity within normal limits. No adenopathy. There is an old healed fracture of the right clavicle. There is thoracolumbar levoscoliosis. Gallbladder is absent. No focal bone lesions evident. IMPRESSION: Extensive airspace consolidation consistent with pneumonia right lower lobe. Lungs elsewhere clear. Heart upper normal in size. No vascular congestion or adenopathy evident. Followup PA and lateral chest radiographs recommended in 3-4 weeks following trial of antibiotic therapy to ensure resolution and exclude underlying malignancy. Electronically Signed   By: Bretta BangWilliam  Woodruff III M.D.   On: 04/24/2017 11:48    Medications: I have reviewed the patient's current medications.  Assessment/Plan: A 42 year old gentleman admitted with acute chest syndrome due to pneumonia and sickle cell crisis.  #1 acute chest syndrome: Continue with antibiotics on oxygen for treatment of pneumonia.  Patient has done much better.  Partial exchange transfusion has been done.  He is much better afterwards.  Continue monitor  #2 sickle cell crisis: Continue to adjust pain medications.  #3 hypertension: Continue blood pressure medications.  #4 chronic pain syndrome: patient on chronic pain medications.  Continue management.  #5 history of asthma: Patient not in exacerbation.  He was hypoxic on admission but not anymore.  LOS: 3 days   Vanna Sailer,LAWAL 04/26/2017, 7:40 AM

## 2017-04-26 NOTE — Progress Notes (Signed)
Patient ID: Dean ReeksDonald R Manni, male   DOB: 04/30/1975, 42 y.o.   MRN: [id number]  Positive Strep Pneumo Urinary antigen and significantly elevated procalcitonin so will continue Ceftriaxone and Azithromycin and discontinue oral Levofloxacin.  Vylet Maffia A.

## 2017-04-28 LAB — BPAM RBC
BLOOD PRODUCT EXPIRATION DATE: 201904032359
Blood Product Expiration Date: 201904032359
ISSUE DATE / TIME: 201903121822
UNIT TYPE AND RH: 5100
Unit Type and Rh: 5100

## 2017-04-28 LAB — TYPE AND SCREEN
ABO/RH(D): O POS
ANTIBODY SCREEN: POSITIVE
DONOR AG TYPE: NEGATIVE
Donor AG Type: NEGATIVE
Unit division: 0
Unit division: 0

## 2017-04-29 ENCOUNTER — Inpatient Hospital Stay (HOSPITAL_COMMUNITY)
Admission: EM | Admit: 2017-04-29 | Discharge: 2017-05-04 | DRG: 811 | Disposition: A | Discharge status: Home / Self Care | Payer: Medicaid Other | Attending: Internal Medicine | Admitting: Internal Medicine

## 2017-04-29 ENCOUNTER — Emergency Department (HOSPITAL_COMMUNITY): Payer: Medicaid Other

## 2017-04-29 ENCOUNTER — Encounter (HOSPITAL_COMMUNITY): Payer: Self-pay

## 2017-04-29 ENCOUNTER — Other Ambulatory Visit: Payer: Self-pay

## 2017-04-29 DIAGNOSIS — R0902 Hypoxemia: Secondary | ICD-10-CM | POA: Diagnosis not present

## 2017-04-29 DIAGNOSIS — Z832 Family history of diseases of the blood and blood-forming organs and certain disorders involving the immune mechanism: Secondary | ICD-10-CM

## 2017-04-29 DIAGNOSIS — F17210 Nicotine dependence, cigarettes, uncomplicated: Secondary | ICD-10-CM | POA: Diagnosis present

## 2017-04-29 DIAGNOSIS — R072 Precordial pain: Secondary | ICD-10-CM

## 2017-04-29 DIAGNOSIS — G4733 Obstructive sleep apnea (adult) (pediatric): Secondary | ICD-10-CM | POA: Diagnosis present

## 2017-04-29 DIAGNOSIS — J180 Bronchopneumonia, unspecified organism: Secondary | ICD-10-CM | POA: Diagnosis present

## 2017-04-29 DIAGNOSIS — Z79899 Other long term (current) drug therapy: Secondary | ICD-10-CM | POA: Diagnosis not present

## 2017-04-29 DIAGNOSIS — G8929 Other chronic pain: Secondary | ICD-10-CM | POA: Diagnosis present

## 2017-04-29 DIAGNOSIS — K838 Other specified diseases of biliary tract: Secondary | ICD-10-CM

## 2017-04-29 DIAGNOSIS — Y95 Nosocomial condition: Secondary | ICD-10-CM | POA: Diagnosis present

## 2017-04-29 DIAGNOSIS — J45909 Unspecified asthma, uncomplicated: Secondary | ICD-10-CM | POA: Diagnosis present

## 2017-04-29 DIAGNOSIS — D5700 Hb-SS disease with crisis, unspecified: Principal | ICD-10-CM | POA: Diagnosis present

## 2017-04-29 DIAGNOSIS — I10 Essential (primary) hypertension: Secondary | ICD-10-CM | POA: Diagnosis not present

## 2017-04-29 DIAGNOSIS — N179 Acute kidney failure, unspecified: Secondary | ICD-10-CM | POA: Diagnosis present

## 2017-04-29 DIAGNOSIS — D638 Anemia in other chronic diseases classified elsewhere: Secondary | ICD-10-CM | POA: Diagnosis present

## 2017-04-29 LAB — CBC WITH DIFFERENTIAL/PLATELET
BASOS ABS: 0.1 10*3/uL (ref 0.0–0.1)
BASOS PCT: 0 %
EOS PCT: 3 %
Eosinophils Absolute: 0.4 10*3/uL (ref 0.0–0.7)
HEMATOCRIT: 32.1 % — AB (ref 39.0–52.0)
Hemoglobin: 11.5 g/dL — ABNORMAL LOW (ref 13.0–17.0)
LYMPHS PCT: 31 %
Lymphs Abs: 4.6 10*3/uL — ABNORMAL HIGH (ref 0.7–4.0)
MCH: 28.5 pg (ref 26.0–34.0)
MCHC: 35.8 g/dL (ref 30.0–36.0)
MCV: 79.7 fL (ref 78.0–100.0)
Monocytes Absolute: 1.9 10*3/uL — ABNORMAL HIGH (ref 0.1–1.0)
Monocytes Relative: 13 %
NEUTROS ABS: 7.6 10*3/uL (ref 1.7–7.7)
Neutrophils Relative %: 53 %
PLATELETS: 407 10*3/uL — AB (ref 150–400)
RBC: 4.03 MIL/uL — AB (ref 4.22–5.81)
RDW: 16.8 % — AB (ref 11.5–15.5)
WBC: 14.6 10*3/uL — AB (ref 4.0–10.5)

## 2017-04-29 LAB — COMPREHENSIVE METABOLIC PANEL
ALK PHOS: 121 U/L (ref 38–126)
ALT: 91 U/L — ABNORMAL HIGH (ref 17–63)
ANION GAP: 10 (ref 5–15)
AST: 62 U/L — ABNORMAL HIGH (ref 15–41)
Albumin: 3.8 g/dL (ref 3.5–5.0)
BILIRUBIN TOTAL: 1.3 mg/dL — AB (ref 0.3–1.2)
BUN: 11 mg/dL (ref 6–20)
CALCIUM: 10.8 mg/dL — AB (ref 8.9–10.3)
CO2: 24 mmol/L (ref 22–32)
Chloride: 104 mmol/L (ref 101–111)
Creatinine, Ser: 1.25 mg/dL — ABNORMAL HIGH (ref 0.61–1.24)
GFR calc Af Amer: >60 mL/min (ref >60)
Glucose, Bld: 137 mg/dL — ABNORMAL HIGH (ref 65–99)
POTASSIUM: 3.6 mmol/L (ref 3.5–5.1)
Sodium: 138 mmol/L (ref 135–145)
TOTAL PROTEIN: 7.5 g/dL (ref 6.5–8.1)

## 2017-04-29 LAB — CULTURE, BLOOD (ROUTINE X 2)
Culture: NO GROWTH
Culture: NO GROWTH
Special Requests: ADEQUATE
Special Requests: ADEQUATE

## 2017-04-29 LAB — TROPONIN I: Troponin I: <0.03 ng/mL (ref <0.03)

## 2017-04-29 LAB — RETICULOCYTES
RBC.: 4.03 MIL/uL — AB (ref 4.22–5.81)
RETIC COUNT ABSOLUTE: 225.7 10*3/uL — AB (ref 19.0–186.0)
Retic Ct Pct: 5.6 % — ABNORMAL HIGH (ref 0.4–3.1)

## 2017-04-29 MED ORDER — IOPAMIDOL (ISOVUE-370) INJECTION 76%
INTRAVENOUS | Status: AC
  Administered 2017-04-29: 100 mL
  Filled 2017-04-29: qty 100

## 2017-04-29 MED ORDER — HYDROMORPHONE HCL 2 MG/ML IJ SOLN
2.0000 mg | INTRAMUSCULAR | Status: AC
  Administered 2017-04-29: 2 mg via INTRAVENOUS
  Filled 2017-04-29: qty 1

## 2017-04-29 MED ORDER — ONDANSETRON HCL 4 MG/2ML IJ SOLN
4.0000 mg | Freq: Once | INTRAMUSCULAR | Status: AC
  Administered 2017-04-29: 4 mg via INTRAVENOUS
  Filled 2017-04-29: qty 2

## 2017-04-29 MED ORDER — HYDROMORPHONE HCL 2 MG/ML IJ SOLN
2.0000 mg | INTRAMUSCULAR | Status: AC
  Administered 2017-04-29 (×2): 2 mg via INTRAVENOUS
  Filled 2017-04-29 (×2): qty 1

## 2017-04-29 MED ORDER — HYDROMORPHONE HCL 2 MG/ML IJ SOLN: 2.0000 mg | INTRAMUSCULAR | Status: AC

## 2017-04-29 MED ORDER — DIPHENHYDRAMINE HCL 25 MG PO CAPS
25.0000 mg | ORAL_CAPSULE | ORAL | Status: DC | PRN
  Administered 2017-04-29: 25 mg via ORAL
  Filled 2017-04-29: qty 1

## 2017-04-29 MED ORDER — KETOROLAC TROMETHAMINE 30 MG/ML IJ SOLN
30.0000 mg | INTRAMUSCULAR | Status: AC
  Administered 2017-04-29: 30 mg via INTRAVENOUS
  Filled 2017-04-29: qty 1

## 2017-04-29 MED ORDER — LEVOFLOXACIN IN D5W 750 MG/150ML IV SOLN
750.0000 mg | Freq: Once | INTRAVENOUS | Status: AC
  Administered 2017-04-30: 750 mg via INTRAVENOUS
  Filled 2017-04-29: qty 150

## 2017-04-29 NOTE — ED Notes (Signed)
Patient transported to CT 

## 2017-04-29 NOTE — ED Provider Notes (Signed)
Emergency Department Provider Note   I have reviewed the triage vital signs and the nursing notes.   HISTORY  Chief Complaint Sickle Cell Pain Crisis and Chest Pain   HPI Dean Webb is a 42 y.o. male with PMH of GERD, Sickle Cell Anemia, and HTN [facility] for evaluation of continued chest pain since recent discharge.  Patient was admitted and discharged 2 days ago with pain in his lower back and bilateral legs.  The patient states that he was treated for possible pneumonia during the hospitalization and is continue taking antibiotics as well as pain medications at home.  Today he is having primarily central, nonradiating chest pain.  No modifying factors.  Denies fevers but has had some chills at home.  No vomiting or diarrhea.  Denies abdominal pain.   Past Medical History:  Diagnosis Date  . Asthma   . GERD (gastroesophageal reflux disease)   . Hb-S/Hb-C disease (HCC) 1979  . HTN (hypertension) 2008  . Hypertension   . Sickle cell anemia (HCC)   . Sickle cell disease [facility](HCC)     Patient Active Problem List   Diagnosis Date Noted  . Sickle cell pain crisis (HCC) 04/29/2017  . Acute chest syndrome (HCC) 04/24/2017  . CAP (community acquired pneumonia) 04/24/2017  . OSA (obstructive sleep apnea) 04/22/2017  . Mild asthma without complication 04/22/2017  . Essential hypertension 03/11/2016  . Chronic prescription opiate use 03/11/2016  . Sickle cell disease (HCC)     Past Surgical History:  Procedure Laterality Date  . CHOLECYSTECTOMY      Current Outpatient Rx  . Order #: [id number] Class: Normal  . Order #: [id number] Class: Normal  . Order #: [id number] Class: Normal  . Order #: [id number] Class: Normal  . Order #: [id number] Class: Normal  . Order #: [id number] Class: Print  . Order #: [id number] Class: Print    Allergies Patient has no known allergies.  Family History  Problem Relation Age of Onset  . Sickle cell trait Mother   .  Hypertension Mother   . Asthma Mother   . Sickle cell trait Father   . Congestive Heart Failure Father   . Hypertension Sister   . Sickle cell anemia Daughter   . Hypertension Daughter   . Diabetes Daughter     Social History Social History   Tobacco Use  . Smoking status: Current Some Day Smoker    Packs/day: 1.50    Years: 17.00    Pack years: 25.50  . Smokeless tobacco: Never Used  Substance Use Topics  . Alcohol use: Yes    Comment: Occasional drinker  . Drug use: Yes    Types: Marijuana    Review of Systems  Constitutional: No fever/chills Eyes: No visual changes. ENT: No sore throat. Cardiovascular: Positive chest pain. Respiratory: Denies shortness of breath. Gastrointestinal: No abdominal pain.  No nausea, no vomiting.  No diarrhea.  No constipation. Genitourinary: Negative for dysuria. Musculoskeletal: Negative for back pain. Skin: Negative for rash. Neurological: Negative for headaches, focal weakness or numbness.  10-point ROS otherwise negative.  ____________________________________________   PHYSICAL EXAM:  VITAL SIGNS: ED Triage Vitals  Enc Vitals Group     BP 04/29/17 1628 (!) 142/102     Pulse Rate 04/29/17 1628 87     Resp 04/29/17 1628 18     Temp 04/29/17 1628 98.2 F (36.8 C)     Temp Source 04/29/17 1628 Oral     SpO2 04/29/17 1628 100 %  Pain Score 04/29/17 1632 10   Constitutional: Alert and oriented. Well appearing and in no acute distress. Eyes: Conjunctivae are normal.  Head: Atraumatic. Nose: No congestion/rhinnorhea. Mouth/Throat: Mucous membranes are moist.  Oropharynx non-erythematous. Neck: No stridor.   Cardiovascular: Normal rate, regular rhythm. Good peripheral circulation. Grossly normal heart sounds.   Respiratory: Normal respiratory effort.  No retractions. Lungs CTAB. Gastrointestinal: Soft and nontender. No distention.  Musculoskeletal: No lower extremity tenderness nor edema. No gross deformities of  extremities. Neurologic:  Normal speech and language. No gross focal neurologic deficits are appreciated.  Skin:  Skin is warm, dry and intact. No rash noted.  ____________________________________________   LABS (all labs ordered are listed, but only abnormal results are displayed)  Labs Reviewed  COMPREHENSIVE METABOLIC PANEL - Abnormal; Notable for the following components:      Result Value   Glucose, Bld 137 (*)    Creatinine, Ser 1.25 (*)    Calcium 10.8 (*)    AST 62 (*)    ALT 91 (*)    Total Bilirubin 1.3 (*)    All other components within normal limits  CBC WITH DIFFERENTIAL/PLATELET - Abnormal; Notable for the following components:   WBC 14.6 (*)    RBC 4.03 (*)    Hemoglobin 11.5 (*)    HCT 32.1 (*)    RDW 16.8 (*)    Platelets 407 (*)    Lymphs Abs 4.6 (*)    Monocytes Absolute 1.9 (*)    All other components within normal limits  RETICULOCYTES - Abnormal; Notable for the following components:   Retic Ct Pct 5.6 (*)    RBC. 4.03 (*)    Retic Count, Absolute 225.7 (*)    All other components within normal limits  CULTURE, BLOOD (ROUTINE X 2)  CULTURE, BLOOD (ROUTINE X 2)  TROPONIN I  PROCALCITONIN  LACTIC ACID, PLASMA  TROPONIN I  TYPE AND SCREEN   ____________________________________________  EKG   EKG Interpretation  Date/Time:  Sunday April 29 2017 16:35:57 EDT Ventricular Rate:  75 PR Interval:    QRS Duration: 76 QT Interval:  353 QTC Calculation: 395 R Axis:   32 Text Interpretation:  Sinus rhythm No STEMI.  Confirmed by Alona Bene [phone]) on 04/29/2017 6:53:41 PM       ____________________________________________  RADIOLOGY  Dg Chest 2 View  Result Date: 04/29/2017 CLINICAL DATA:  Chest pain and shortness of breath EXAM: CHEST - 2 VIEW COMPARISON:  April 24, 2017 FINDINGS: The heart size and mediastinal contours are within normal limits. Both lungs are clear. The visualized skeletal structures are unremarkable. IMPRESSION: No active  cardiopulmonary disease. Electronically Signed   By: Gerome Sam III M.D   On: 04/29/2017 17:32   Ct Angio Chest Pe W And/or Wo Contrast  Result Date: 04/29/2017 CLINICAL DATA:  42 year old male with chest pain and shortness of breath today. History of sickle cell disease. EXAM: CT ANGIOGRAPHY CHEST WITH CONTRAST TECHNIQUE: Multidetector CT imaging of the chest was performed using the standard protocol during bolus administration of intravenous contrast. Multiplanar CT image reconstructions and MIPs were obtained to evaluate the vascular anatomy. CONTRAST:  ISOVUE-370 IOPAMIDOL (ISOVUE-370) INJECTION 76% COMPARISON:  Chest CT 03/08/2016. FINDINGS: Cardiovascular: No filling defects in the pulmonary arterial tree to suggest underlying pulmonary embolism. Heart size is borderline enlarged. There is no significant pericardial fluid, thickening or pericardial calcification. No atherosclerotic calcifications in the thoracic aorta or the coronary arteries. Mediastinum/Nodes: No pathologically enlarged mediastinal or hilar lymph nodes.  Esophagus is mildly patulous. No axillary lymphadenopathy. Lungs/Pleura: Multifocal peribronchovascular ground-glass attenuation and nodularity noted throughout the lungs bilaterally (right greater than left), most compatible with multilobar bronchopneumonia. No pleural effusions. Upper Abdomen: Status post cholecystectomy. 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 Musculoskeletal: Old healed right clavicular fracture with posttraumatic deformity. There are no aggressive appearing lytic or blastic lesions noted in the visualized portions of the skeleton. Review of the MIP images confirms the above findings. IMPRESSION: 1. No evidence of pulmonary embolism. 2. Multilobar bronchopneumonia, as above. 3. Additional incidental findings, as above. Electronically Signed   By: Trudie Reed M.D.   On: 04/29/2017 21:59     ____________________________________________   PROCEDURES  Procedure(s) performed:   Procedures   None ____________________________________________   INITIAL IMPRESSION / ASSESSMENT AND PLAN / ED COURSE  Pertinent labs & imaging results that were available during my care of the patient were reviewed by me and considered in my medical decision making (see chart for details).  Patient presents to the emergency department for evaluation of chest pain after recent admission for pain control in the setting of sickle cell crisis.  He has no hypoxemia, fever, tachycardia year.  He states he sometimes has chest discomfort with his sickle cell crisis symptoms.  Lower suspicion for PE.  Chest x-ray reviewed which shows no acute infiltrate.  Labs near baseline.  Plan for pain control and reassess.  10:30 PM Patient with continued 8/10 pain meds after IVF, Toradol, and Dilaudid x 4. No fever or hypoxemia here. Plan for admission for pain control. CTA reviewed with residual multifocal PNA. Clinically not worsening PNA symptoms so will continue Levaquin for now. Labs reviewed and near baseline. Doubt recurrent acute chest at this time.   Discussed patient's case with Hospitalist, Dr. Toniann Fail to request admission. Patient and family (if present) updated with plan. Care transferred to Hospitalist service.  I reviewed all nursing notes, vitals, pertinent old records, EKGs, labs, imaging (as available).  ____________________________________________  FINAL CLINICAL IMPRESSION(S) / ED DIAGNOSES  Final diagnoses:  Sickle cell pain crisis (HCC)  Precordial chest pain     MEDICATIONS GIVEN DURING THIS VISIT:  Medications  diphenhydrAMINE (BENADRYL) capsule 25-50 mg (25 mg Oral Given 04/29/17 1907)  levofloxacin (LEVAQUIN) IVPB 750 mg (not administered)  ketorolac (TORADOL) 30 MG/ML injection 30 mg (30 mg Intravenous Given 04/29/17 1910)  HYDROmorphone (DILAUDID) injection 2 mg (2 mg  Intravenous Given 04/29/17 1909)    Or  HYDROmorphone (DILAUDID) injection 2 mg ( Subcutaneous See Alternative 04/29/17 1909)  HYDROmorphone (DILAUDID) injection 2 mg (2 mg Intravenous Given 04/29/17 2011)    Or  HYDROmorphone (DILAUDID) injection 2 mg ( Subcutaneous See Alternative 04/29/17 2011)  HYDROmorphone (DILAUDID) injection 2 mg (2 mg Intravenous Given 04/29/17 2056)    Or  HYDROmorphone (DILAUDID) injection 2 mg ( Subcutaneous See Alternative 04/29/17 2056)  HYDROmorphone (DILAUDID) injection 2 mg (2 mg Intravenous Given 04/29/17 2223)    Or  HYDROmorphone (DILAUDID) injection 2 mg ( Subcutaneous See Alternative 04/29/17 2223)  ondansetron (ZOFRAN) injection 4 mg (4 mg Intravenous Given 04/29/17 1910)  iopamidol (ISOVUE-370) 76 % injection (100 mLs  Contrast Given 04/29/17 2119)    Note:  This document was prepared using Dragon voice recognition software and may include unintentional dictation errors.  Alona Bene, MD Emergency Medicine    Long, Arlyss Repress, MD 04/29/17 [phone]

## 2017-04-29 NOTE — ED Triage Notes (Signed)
Pt reports chest pain starting today accompanied by some SOB. He was recently d/c'd from inpatient for sickle cell where he reports that he needed a transfusion and was dx'd with pneumonia. He reports that his home meds are not working.

## 2017-04-29 NOTE — ED Notes (Signed)
ED TO INPATIENT HANDOFF REPORT  Name/Age/Gender Dean Webb 42 y.o. male  Code Status Code Status History    Date Active Date Inactive Code Status Order ID Comments User Context   04/22/2017 15:18 04/26/2017 14:54 Full Code [id number]  Leana Gamer, MD Inpatient   03/08/2016 18:01 03/11/2016 17:34 Full Code [id number]  Leana Gamer, MD Inpatient      Home/SNF/Other Home  Chief Complaint Sickle Cell Pain Crisis   Level of Care/Admitting Diagnosis ED Disposition    ED Disposition Condition [place] [100102]  Level of Care: Med-Surg [16]  Diagnosis: Sickle cell pain crisis [facility]) [[id number]]  Admitting Physician: Rise Patience [phone]  Attending Physician: Rise Patience [phone]  Estimated length of stay: past midnight tomorrow  Certification:: I certify this patient will need inpatient services for at least 2 midnights  PT Class (Do Not Modify): Inpatient [101]  PT Acc Code (Do Not Modify): Private [1]       Medical History Past Medical History:  Diagnosis Date  . Asthma   . GERD (gastroesophageal reflux disease)   . Hb-S/Hb-C disease ([place]) 1979  . HTN (hypertension) 2008  . Hypertension   . Sickle cell anemia (HCC)   . Sickle cell disease ([place])     Allergies No Known Allergies  IV Location/Drains/Wounds Patient Lines/Drains/Airways Status   Active Line/Drains/Airways    Name:   Placement date:   Placement time:   Site:   Days:   Peripheral IV 04/29/17 Medial;Right Antecubital   04/29/17    1833    Antecubital   less than 1          Labs/Imaging Results for orders placed or performed during the hospital encounter of 04/29/17 (from the past 48 hour(s))  Comprehensive metabolic panel     Status: Abnormal   Collection Time: 04/29/17  4:51 PM  Result Value Ref Range   Sodium 138 135 - 145 mmol/L   Potassium 3.6 3.5 - 5.1 mmol/L   Chloride 104 101 - 111 mmol/L   CO2 24 22 - 32  mmol/L   Glucose, Bld 137 (H) 65 - 99 mg/dL   BUN 11 6 - 20 mg/dL   Creatinine, Ser 1.25 (H) 0.61 - 1.24 mg/dL   Calcium 10.8 (H) 8.9 - 10.3 mg/dL   Total Protein 7.5 6.5 - 8.1 g/dL   Albumin 3.8 3.5 - 5.0 g/dL   AST 62 (H) 15 - 41 U/L   ALT 91 (H) 17 - 63 U/L   Alkaline Phosphatase 121 38 - 126 U/L   Total Bilirubin 1.3 (H) 0.3 - 1.2 mg/dL   GFR calc non Af Amer >60 >60 mL/min   GFR calc Af Amer >60 >60 mL/min    Comment: (NOTE) The eGFR has been calculated using the CKD EPI equation. This calculation has not been validated in all clinical situations. eGFR's persistently <60 mL/min signify possible Chronic Kidney Disease.    Anion gap 10 5 - 15    Comment: Performed at [facility], [place] [address] [place], [place] [address]  CBC with Differential     Status: Abnormal   Collection Time: 04/29/17  4:51 PM  Result Value Ref Range   WBC 14.6 (H) 4.0 - 10.5 K/uL   RBC 4.03 (L) 4.22 - 5.81 MIL/uL   Hemoglobin 11.5 (L) 13.0 - 17.0 g/dL   HCT 32.1 (L) 39.0 - 52.0 %   MCV 79.7 78.0 -  100.0 fL   MCH 28.5 26.0 - 34.0 pg   MCHC 35.8 30.0 - 36.0 g/dL   RDW 16.8 (H) 11.5 - 15.5 %   Platelets 407 (H) 150 - 400 K/uL   Neutrophils Relative % 53 %   Neutro Abs 7.6 1.7 - 7.7 K/uL   Lymphocytes Relative 31 %   Lymphs Abs 4.6 (H) 0.7 - 4.0 K/uL   Monocytes Relative 13 %   Monocytes Absolute 1.9 (H) 0.1 - 1.0 K/uL   Eosinophils Relative 3 %   Eosinophils Absolute 0.4 0.0 - 0.7 K/uL   Basophils Relative 0 %   Basophils Absolute 0.1 0.0 - 0.1 K/uL    Comment: Performed at [facility], [place] [address], [place], [place] [address]  Reticulocytes     Status: Abnormal   Collection Time: 04/29/17  4:51 PM  Result Value Ref Range   Retic Ct Pct 5.6 (H) 0.4 - 3.1 %   RBC. 4.03 (L) 4.22 - 5.81 MIL/uL   Retic Count, Absolute 225.7 (H) 19.0 - 186.0 K/uL    Comment: Performed at [facility], [place] [address] [place], [place] [address]   Troponin I     Status: None   Collection Time: 04/29/17  5:00 PM  Result Value Ref Range   Troponin I <0.03 <0.03 ng/mL    Comment: Performed at [facility], [place] [address] [place], [place] [address]   Dg Chest 2 View  Result Date: 04/29/2017 CLINICAL DATA:  Chest pain and shortness of breath EXAM: CHEST - 2 VIEW COMPARISON:  April 24, 2017 FINDINGS: The heart size and mediastinal contours are within normal limits. Both lungs are clear. The visualized skeletal structures are unremarkable. IMPRESSION: No active cardiopulmonary disease. Electronically Signed   By: Dorise Bullion III M.D   On: 04/29/2017 17:32   Ct Angio Chest Pe W And/or Wo Contrast  Result Date: 04/29/2017 CLINICAL DATA:  41 year old male with chest pain and shortness of breath today. History of sickle cell disease. EXAM: CT ANGIOGRAPHY CHEST WITH CONTRAST TECHNIQUE: Multidetector CT imaging of the chest was performed using the standard protocol during bolus administration of intravenous contrast. Multiplanar CT image reconstructions and MIPs were obtained to evaluate the vascular anatomy. CONTRAST:  161m ISOVUE-370 IOPAMIDOL (ISOVUE-370) INJECTION 76% COMPARISON:  Chest CT 03/08/2016. FINDINGS: Cardiovascular: No filling defects in the pulmonary arterial tree to suggest underlying pulmonary embolism. Heart size is borderline enlarged. There is no significant pericardial fluid, thickening or pericardial calcification. No atherosclerotic calcifications in the thoracic aorta or the coronary arteries. Mediastinum/Nodes: No pathologically enlarged mediastinal or hilar lymph nodes. Esophagus is mildly patulous. No axillary lymphadenopathy. Lungs/Pleura: Multifocal peribronchovascular ground-glass attenuation and nodularity noted throughout the lungs bilaterally (right greater than left), most compatible with multilobar bronchopneumonia. No pleural effusions. Upper Abdomen: Status post cholecystectomy. Spleen has a  shrunken appearance, suggesting partial auto splenectomy. Musculoskeletal: Old healed right clavicular fracture with posttraumatic deformity. There are no aggressive appearing lytic or blastic lesions noted in the visualized portions of the skeleton. Review of the MIP images confirms the above findings. IMPRESSION: 1. No evidence of pulmonary embolism. 2. Multilobar bronchopneumonia, as above. 3. Additional incidental findings, as above. Electronically Signed   By: DVinnie LangtonM.D.   On: 04/29/2017 21:59    Pending Labs Unresulted Labs (From admission, onward)   Start     Ordered   04/29/17 2247  Procalcitonin - Baseline  STAT,   STAT     04/29/17 2246  04/29/17 2247  Lactic acid, plasma  STAT,   R     04/29/17 2246   04/29/17 2247  Troponin I  Once,   R     04/29/17 2246   04/29/17 2247  Culture, blood (routine x 2)  BLOOD CULTURE X 2,   R     04/29/17 2247   04/29/17 2247  Type and screen [place]  Once,   R    Comments:  [place]    04/29/17 2247      Vitals/Pain Today's Vitals   04/29/17 2056 04/29/17 2100 04/29/17 2223 04/29/17 2224  BP:  124/82  136/74  Pulse:  64  68  Resp:  14  13  Temp:      TempSrc:      SpO2:  95%  93%  Weight:      Height:      PainSc: 9   8      Isolation Precautions No active isolations  Medications Medications  diphenhydrAMINE (BENADRYL) capsule 25-50 mg (25 mg Oral Given 04/29/17 1907)  levofloxacin (LEVAQUIN) IVPB 750 mg (not administered)  ketorolac (TORADOL) 30 MG/ML injection 30 mg (30 mg Intravenous Given 04/29/17 1910)  HYDROmorphone (DILAUDID) injection 2 mg (2 mg Intravenous Given 04/29/17 1909)    Or  HYDROmorphone (DILAUDID) injection 2 mg ( Subcutaneous See Alternative 04/29/17 1909)  HYDROmorphone (DILAUDID) injection 2 mg (2 mg Intravenous Given 04/29/17 2011)    Or  HYDROmorphone (DILAUDID) injection 2 mg ( Subcutaneous See Alternative 04/29/17 2011)  HYDROmorphone (DILAUDID)  injection 2 mg (2 mg Intravenous Given 04/29/17 2056)    Or  HYDROmorphone (DILAUDID) injection 2 mg ( Subcutaneous See Alternative 04/29/17 2056)  HYDROmorphone (DILAUDID) injection 2 mg (2 mg Intravenous Given 04/29/17 2223)    Or  HYDROmorphone (DILAUDID) injection 2 mg ( Subcutaneous See Alternative 04/29/17 2223)  ondansetron (ZOFRAN) injection 4 mg (4 mg Intravenous Given 04/29/17 1910)  iopamidol (ISOVUE-370) 76 % injection (100 mLs  Contrast Given 04/29/17 2119)    Mobility walks with person assist (Due to current medication) ]

## 2017-04-30 ENCOUNTER — Encounter (HOSPITAL_COMMUNITY): Payer: Self-pay | Admitting: Internal Medicine

## 2017-04-30 DIAGNOSIS — I10 Essential (primary) hypertension: Secondary | ICD-10-CM

## 2017-04-30 DIAGNOSIS — D5700 Hb-SS disease with crisis, unspecified: Principal | ICD-10-CM

## 2017-04-30 LAB — CBC
HCT: 29.7 % — ABNORMAL LOW (ref 39.0–52.0)
Hemoglobin: 10.6 g/dL — ABNORMAL LOW (ref 13.0–17.0)
MCH: 28.3 pg (ref 26.0–34.0)
MCHC: 35.7 g/dL (ref 30.0–36.0)
MCV: 79.2 fL (ref 78.0–100.0)
PLATELETS: 318 10*3/uL (ref 150–400)
RBC: 3.75 MIL/uL — AB (ref 4.22–5.81)
RDW: 17.1 % — AB (ref 11.5–15.5)
WBC: 15.9 10*3/uL — ABNORMAL HIGH (ref 4.0–10.5)

## 2017-04-30 LAB — URINALYSIS, ROUTINE W REFLEX MICROSCOPIC
Bacteria, UA: NONE SEEN
Bilirubin Urine: NEGATIVE
Glucose, UA: NEGATIVE mg/dL
Hgb urine dipstick: NEGATIVE
Ketones, ur: NEGATIVE mg/dL
Leukocytes, UA: NEGATIVE
Nitrite: NEGATIVE
PH: 5 (ref 5.0–8.0)
Protein, ur: NEGATIVE mg/dL
SQUAMOUS EPITHELIAL / LPF: NONE SEEN

## 2017-04-30 LAB — MRSA PCR SCREENING: MRSA BY PCR: NEGATIVE

## 2017-04-30 LAB — LACTATE DEHYDROGENASE: LDH: 167 U/L (ref 98–192)

## 2017-04-30 LAB — TROPONIN I: Troponin I: <0.03 ng/mL (ref <0.03)

## 2017-04-30 LAB — PROCALCITONIN: Procalcitonin: 0.63 ng/mL

## 2017-04-30 MED ORDER — SODIUM CHLORIDE 0.9% FLUSH: 9.0000 mL | INTRAVENOUS | Status: DC | PRN

## 2017-04-30 MED ORDER — HYDROMORPHONE 1 MG/ML IV SOLN
INTRAVENOUS | Status: DC
  Administered 2017-04-30: 0 mg via INTRAVENOUS
  Administered 2017-04-30: 11:00:00 via INTRAVENOUS
  Administered 2017-04-30: 0.5 mg via INTRAVENOUS
  Administered 2017-04-30: 1 mg via INTRAVENOUS
  Administered 2017-05-01 (×4): 0.5 mg via INTRAVENOUS
  Administered 2017-05-01: 0 mg via INTRAVENOUS
  Administered 2017-05-01: 0.5 mg via INTRAVENOUS
  Administered 2017-05-01: 0 mg via INTRAVENOUS
  Administered 2017-05-02 (×6): 0.5 mg via INTRAVENOUS
  Administered 2017-05-03 (×2): 0.1 mg via INTRAVENOUS
  Administered 2017-05-03: 0 mg via INTRAVENOUS
  Administered 2017-05-03 – 2017-05-04 (×3): 0.5 mg via INTRAVENOUS
  Administered 2017-05-04: 0 mg via INTRAVENOUS
  Administered 2017-05-04: 1 mg via INTRAVENOUS
  Filled 2017-04-30: qty 25

## 2017-04-30 MED ORDER — VANCOMYCIN HCL IN DEXTROSE 750-5 MG/150ML-% IV SOLN: 750.0000 mg | Freq: Two times a day (BID) | INTRAVENOUS | Status: DC

## 2017-04-30 MED ORDER — SODIUM CHLORIDE 0.9% FLUSH
9.0000 mL | INTRAVENOUS | Status: DC | PRN
Start: 2017-04-30 — End: 2017-04-30

## 2017-04-30 MED ORDER — SENNOSIDES-DOCUSATE SODIUM 8.6-50 MG PO TABS
1.0000 | ORAL_TABLET | Freq: Two times a day (BID) | ORAL | Status: DC
  Administered 2017-04-30 – 2017-05-04 (×10): 1 via ORAL
  Filled 2017-04-30 (×10): qty 1

## 2017-04-30 MED ORDER — LEVOFLOXACIN IN D5W 750 MG/150ML IV SOLN: 750.0000 mg | Freq: Every day | INTRAVENOUS | Status: DC

## 2017-04-30 MED ORDER — HYDROMORPHONE 1 MG/ML IV SOLN: INTRAVENOUS | Status: DC

## 2017-04-30 MED ORDER — DIPHENHYDRAMINE HCL 25 MG PO CAPS: 25.0000 mg | ORAL_CAPSULE | ORAL | Status: DC | PRN

## 2017-04-30 MED ORDER — SODIUM CHLORIDE 0.45 % IV SOLN
INTRAVENOUS | Status: AC
  Administered 2017-04-30: 01:00:00 via INTRAVENOUS

## 2017-04-30 MED ORDER — KETOROLAC TROMETHAMINE 30 MG/ML IJ SOLN
30.0000 mg | Freq: Four times a day (QID) | INTRAMUSCULAR | Status: AC
  Administered 2017-04-30 – 2017-05-01 (×5): 30 mg via INTRAVENOUS
  Filled 2017-04-30 (×5): qty 1

## 2017-04-30 MED ORDER — ONDANSETRON HCL 4 MG/2ML IJ SOLN
4.0000 mg | Freq: Four times a day (QID) | INTRAMUSCULAR | Status: DC | PRN
  Administered 2017-04-30: 4 mg via INTRAVENOUS
  Filled 2017-04-30: qty 2

## 2017-04-30 MED ORDER — HYDRALAZINE HCL 20 MG/ML IJ SOLN
5.0000 mg | INTRAMUSCULAR | Status: DC | PRN
  Filled 2017-04-30: qty 1

## 2017-04-30 MED ORDER — POLYETHYLENE GLYCOL 3350 17 G PO PACK: 17.0000 g | PACK | Freq: Every day | ORAL | Status: DC | PRN

## 2017-04-30 MED ORDER — VANCOMYCIN HCL 10 G IV SOLR
1500.0000 mg | Freq: Once | INTRAVENOUS | Status: AC
  Administered 2017-04-30: 1500 mg via INTRAVENOUS
  Filled 2017-04-30 (×2): qty 1500

## 2017-04-30 MED ORDER — ONDANSETRON HCL 4 MG/2ML IJ SOLN: 4.0000 mg | Freq: Four times a day (QID) | INTRAMUSCULAR | Status: DC | PRN

## 2017-04-30 MED ORDER — NALOXONE HCL 0.4 MG/ML IJ SOLN: 0.4000 mg | INTRAMUSCULAR | Status: DC | PRN

## 2017-04-30 MED ORDER — AMLODIPINE BESYLATE 10 MG PO TABS
10.0000 mg | ORAL_TABLET | Freq: Every day | ORAL | Status: DC
Start: 2017-04-30 — End: 2017-05-04
  Administered 2017-04-30 – 2017-05-04 (×5): 10 mg via ORAL
  Filled 2017-04-30 (×5): qty 1

## 2017-04-30 MED ORDER — MORPHINE SULFATE ER 30 MG PO TBCR
60.0000 mg | EXTENDED_RELEASE_TABLET | Freq: Two times a day (BID) | ORAL | Status: DC
  Administered 2017-04-30 – 2017-05-04 (×10): 60 mg via ORAL
  Filled 2017-04-30 (×10): qty 2

## 2017-04-30 MED ORDER — SODIUM CHLORIDE 0.9 % IV SOLN: 25.0000 mg | INTRAVENOUS | Status: DC | PRN

## 2017-04-30 MED ORDER — PIPERACILLIN-TAZOBACTAM 3.375 G IVPB
3.3750 g | Freq: Three times a day (TID) | INTRAVENOUS | Status: DC
  Administered 2017-04-30 – 2017-05-04 (×14): 3.375 g via INTRAVENOUS
  Filled 2017-04-30 (×17): qty 50

## 2017-04-30 NOTE — H&P (Addendum)
History and Physical    Dean Webb ZOX:[id number] DOB: 12-Dec-1975 DOA: 04/29/2017  PCP: Billee Cashing, MD  Patient coming from: Home.  Chief Complaint: Chest pain.  HPI: Dean Webb is a 42 y.o. male with history of sickle cell anemia, hypertension who was discharged 2 days ago after being treated for sickle cell pain crisis with pneumonia and acute chest syndrome presents to the ER after being having persistent chest pain since discharge.  Patient states he has been anxious his chest pain and increases on coughing and also had at times vomiting.  He has been bringing out discolored sputum on coughing.  Denies any fever chills or difficulty breathing.  Denies any headache visual symptoms or any focal deficits.  Denies any abdominal pain or diarrhea.  Has been taking his Levaquin and other medications.  ED Course: In the ER patient was not hypoxic or febrile.  Since there was concern for being having persistent chest pain and possibility of pulmonary embolism CT angiogram was done which shows multifocal pneumonia.  On exam patient is not hypoxic and not febrile.  Patient admitted for sickle cell pain crisis with pneumonia and possible acute chest syndrome.  Review of Systems: As per HPI, rest all negative.   Past Medical History:  Diagnosis Date  . Asthma   . GERD (gastroesophageal reflux disease)   . Hb-S/Hb-C disease (HCC) 1979  . HTN (hypertension) 2008  . Hypertension   . Sickle cell anemia (HCC)   . Sickle cell disease (HCC)     Past Surgical History:  Procedure Laterality Date  . CHOLECYSTECTOMY       reports that he has been smoking.  He has a 25.50 pack-year smoking history. he has never used smokeless tobacco. He reports that he drinks alcohol. He reports that he uses drugs. Drug: Marijuana.  No Known Allergies  Family History  Problem Relation Age of Onset  . Sickle cell trait Mother   . Hypertension Mother   . Asthma Mother   . Sickle cell trait  Father   . Congestive Heart Failure Father   . Hypertension Sister   . Sickle cell anemia Daughter   . Hypertension Daughter   . Diabetes Daughter     Prior to Admission medications   Medication Sig Start Date End Date Taking? Authorizing Provider  albuterol (PROVENTIL HFA;VENTOLIN HFA) 108 (90 Base) MCG/ACT inhaler Inhale 2 puffs into the lungs every 6 (six) hours as needed for wheezing or shortness of breath. 04/23/17  Yes Altha Harm, MD  amLODipine (NORVASC) 10 MG tablet Take 1 tablet (10 mg total) by mouth daily. 04/23/17  Yes Altha Harm, MD  cetirizine (ZYRTEC) 10 MG tablet Take 1 tablet (10 mg total) by mouth daily as needed for allergies. 04/23/17  Yes Altha Harm, MD  ibuprofen (ADVIL,MOTRIN) 800 MG tablet Take 1 tablet (800 mg total) by mouth every 8 (eight) hours as needed. Patient taking differently: Take 800 mg by mouth every 8 (eight) hours as needed for headache, mild pain or moderate pain.  04/23/17  Yes Altha Harm, MD  levofloxacin (LEVAQUIN) 750 MG tablet Take 1 tablet (750 mg total) by mouth daily for 7 days. 04/26/17 05/03/17 Yes Rometta Emery, MD  morphine (MS CONTIN) 60 MG 12 hr tablet Take 1 tablet (60 mg total) by mouth every 12 (twelve) hours. 04/23/17  Yes Altha Harm, MD  oxycodone (ROXICODONE) 30 MG immediate release tablet Take 1 tablet (30 mg total) by  mouth every 4 (four) hours as needed for pain. Pt taking differently: 30 mg BID 04/23/17  Yes Altha Harm, MD    Physical Exam: Vitals:   04/29/17 2224 04/29/17 2230 04/29/17 2300 04/29/17 2350  BP: 136/74 134/78 133/90 133/88  Pulse: 68   60  Resp: 13 11 16 18   Temp:    98.3 F (36.8 C)  TempSrc:    Oral  SpO2: 93%   98%  Weight:      Height:          Constitutional: Moderately built and nourished. Vitals:   04/29/17 2224 04/29/17 2230 04/29/17 2300 04/29/17 2350  BP: 136/74 134/78 133/90 133/88  Pulse: 68   60  Resp: 13 11 16 18   Temp:    98.3  F (36.8 C)  TempSrc:    Oral  SpO2: 93%   98%  Weight:      Height:       Eyes: Anicteric no pallor. ENMT: No discharge from the ears eyes nose or mouth. Neck: No mass felt.  No neck rigidity.  No JVD appreciated. Respiratory: No rhonchi or crepitations. Cardiovascular: S1-S2 heard no murmurs appreciated. Abdomen: Soft nontender bowel sounds present. Musculoskeletal: No edema.  No joint effusion. Skin: No rash.  Skin appears warm. Neurologic: Alert awake oriented to time place and person.  Moves all extremities. Psychiatric: Appears normal.  Normal affect.   Labs on Admission: I have personally reviewed following labs and imaging studies  CBC: Recent Labs  Lab 04/24/17 1228 04/25/17 0936 04/25/17 1938 04/26/17 0622 04/29/17 1651  WBC 25.6* 40.1* 36.2* 34.1* 14.6*  NEUTROABS 22.8* 32.9* 31.5* 28.0* 7.6  HGB 13.0 11.2* 10.6* 10.7* 11.5*  HCT 34.9* 30.4* 28.7* 29.6* 32.1*  MCV 78.8 78.1 79.3 79.1 79.7  PLT 228 212 199 225 407*   Basic Metabolic Panel: Recent Labs  Lab 04/24/17 1228 04/25/17 0936 04/26/17 0622 04/29/17 1651  NA 137 138 137 138  K 4.6 3.8 4.0 3.6  CL 99* 102 102 104  CO2 26 28 27 24   GLUCOSE 92 147* 91 137*  BUN 15 13 9 11   CREATININE 1.23 1.22 1.01 1.25*  CALCIUM 10.1 10.0 9.8 10.8*   GFR: Estimated Creatinine Clearance: 83.4 mL/min (A) (by C-G formula based on SCr of 1.25 mg/dL (H)). Liver Function Tests: Recent Labs  Lab 04/29/17 1651  AST 62*  ALT 91*  ALKPHOS 121  BILITOT 1.3*  PROT 7.5  ALBUMIN 3.8   No results for input(s): LIPASE, AMYLASE in the last 168 hours. No results for input(s): AMMONIA in the last 168 hours. Coagulation Profile: No results for input(s): INR, PROTIME in the last 168 hours. Cardiac Enzymes: Recent Labs  Lab 04/29/17 1700  TROPONINI <0.03   BNP (last 3 results) No results for input(s): PROBNP in the last 8760 hours. HbA1C: No results for input(s): HGBA1C in the last 72 hours. CBG: No results for  input(s): GLUCAP in the last 168 hours. Lipid Profile: No results for input(s): CHOL, HDL, LDLCALC, TRIG, CHOLHDL, LDLDIRECT in the last 72 hours. Thyroid Function Tests: No results for input(s): TSH, T4TOTAL, FREET4, T3FREE, THYROIDAB in the last 72 hours. Anemia Panel: Recent Labs    04/29/17 1651  RETICCTPCT 5.6*   Urine analysis:    Component Value Date/Time   COLORURINE AMBER (A) 06/04/2011 0733   APPEARANCEUR CLEAR 06/04/2011 0733   LABSPEC 1.025 11/01/2012 1010   PHURINE 5.5 11/01/2012 1010   GLUCOSEU NEGATIVE 11/01/2012 1010   HGBUR NEGATIVE  11/01/2012 1010   BILIRUBINUR NEGATIVE 11/01/2012 1010   KETONESUR NEGATIVE 11/01/2012 1010   PROTEINUR NEGATIVE 11/01/2012 1010   UROBILINOGEN 0.2 11/01/2012 1010   NITRITE POSITIVE (A) 11/01/2012 1010   LEUKOCYTESUR NEGATIVE 11/01/2012 1010   Sepsis Labs: @LABRCNTIP (procalcitonin:4,lacticidven:4) ) Recent Results (from the past 240 hour(s))  Culture, blood (routine x 2) Call MD if unable to obtain prior to antibiotics being given     Status: None   Collection Time: 04/24/17 12:39 PM  Result Value Ref Range Status   Specimen Description   Final    BLOOD LEFT ARM Performed at [facility], [address] [place], [place] [address]    Special Requests   Final    BOTTLES DRAWN AEROBIC AND ANAEROBIC Blood Culture adequate volume Performed at [facility], [address], [place], [place] [address]    Culture   Final    NO GROWTH 5 DAYS Performed at [facility], [address], [place], [place] [address]    Report Status 04/29/2017 FINAL  Final  Culture, blood (routine x 2) Call MD if unable to obtain prior to antibiotics being given     Status: None   Collection Time: 04/24/17  1:02 PM  Result Value Ref Range Status   Specimen Description   Final    BLOOD RIGHT HAND Performed at [facility], [address], [place], [place] [address]    Special Requests    Final    BOTTLES DRAWN AEROBIC AND ANAEROBIC Blood Culture adequate volume Performed at [facility], [address], [place], [place] [address]    Culture   Final    NO GROWTH 5 DAYS Performed at [facility], [address] [place], [place] [address]    Report Status 04/29/2017 FINAL  Final     Radiological Exams on Admission: Dg Chest 2 View  Result Date: 04/29/2017 CLINICAL DATA:  Chest pain and shortness of breath EXAM: CHEST - 2 VIEW COMPARISON:  April 24, 2017 FINDINGS: The heart size and mediastinal contours are within normal limits. Both lungs are clear. The visualized skeletal structures are unremarkable. IMPRESSION: No active cardiopulmonary disease. Electronically Signed   By: Gerome Sam III M.D   On: 04/29/2017 17:32   Ct Angio Chest Pe W And/or Wo Contrast  Result Date: 04/29/2017 CLINICAL DATA:  42 year old male with chest pain and shortness of breath today. History of sickle cell disease. EXAM: CT ANGIOGRAPHY CHEST WITH CONTRAST TECHNIQUE: Multidetector CT imaging of the chest was performed using the standard protocol during bolus administration of intravenous contrast. Multiplanar CT image reconstructions and MIPs were obtained to evaluate the vascular anatomy. CONTRAST:  ISOVUE-370 IOPAMIDOL (ISOVUE-370) INJECTION 76% COMPARISON:  Chest CT 03/08/2016. FINDINGS: Cardiovascular: No filling defects in the pulmonary arterial tree to suggest underlying pulmonary embolism. Heart size is borderline enlarged. There is no significant pericardial fluid, thickening or pericardial calcification. No atherosclerotic calcifications in the thoracic aorta or the coronary arteries. Mediastinum/Nodes: No pathologically enlarged mediastinal or hilar lymph nodes. Esophagus is mildly patulous. No axillary lymphadenopathy. Lungs/Pleura: Multifocal peribronchovascular ground-glass attenuation and nodularity noted throughout the lungs bilaterally (right greater than  left), most compatible with multilobar bronchopneumonia. No pleural effusions. Upper Abdomen: Status post cholecystectomy. 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 Musculoskeletal: Old healed right clavicular fracture with posttraumatic deformity. There are no aggressive appearing lytic or blastic lesions noted in the visualized portions of the skeleton. Review of the  MIP images confirms the above findings. IMPRESSION: 1. No evidence of pulmonary embolism. 2. Multilobar bronchopneumonia, as above. 3. Additional incidental findings, as above. Electronically Signed   By: Trudie Reedaniel  Entrikin M.D.   On: 04/29/2017 21:59    EKG: Independently reviewed.  Normal sinus rhythm.  Assessment/Plan Principal Problem:   Sickle cell pain crisis (HCC) Active Problems:   Essential hypertension   OSA (obstructive sleep apnea)    1. Sickle cell pain crisis -patient has been placed on weight-based Dilaudid PCA with IV fluids. 2. Multifocal pneumonia with possible acute chest syndrome -patient will be continued on Levaquin since recent admission patient has Streptococcus pneumonia.  Will obtain blood cultures check pro-calcitonin levels lactic acid levels troponin and for now I am placing patient on vancomycin and also Zosyn since patient also had vomiting and possibility of aspiration.  Will check MRSA PCR. 3. Hypertension on amlodipine. 4. Nausea vomiting -patient had dark colored vomitus.  Will check CBC closely. 5. Acute renal failure -we will try to avoid NSAIDs at this time and gently hydrate.  Urine analysis is pending.   DVT prophylaxis: SCDs for now until we make sure there is no GI bleed. Code Status: Full code. Family Communication: Discussed with patient. Disposition Plan: Home. Consults called: None. Admission status: Inpatient.   Eduard ClosArshad N Mayley Lish MD [facility] Pager [phone].  If 7PM-7AM, please contact night-coverage [email or web address] Password  [email or web address]  04/30/2017, 12:20 AM

## 2017-04-30 NOTE — Progress Notes (Addendum)
Pharmacy Antibiotic Note  Dean Webb is a 42 y.o. male admitted on 04/29/2017 with pneumonia.  Pharmacy has been consulted for vancomycin, levofloxacin, zosyn dosing.  Plan: Levofloxacin 750mg  iv q24hr Zosyn 3.375g IV Q8H infused over 4hrs.   Vancomycin 1500mg  iv x1, then 750mg  iv q12hr Goal AUC = 400 - 500 for all indications, except meningitis (goal AUC > 500 and Cmin 15-20 mcg/mL)   Height: 5\' 7"  (170.2 cm) Weight: 195 lb (88.5 kg) IBW/kg (Calculated) : 66.1  Temp (24hrs), Avg:98.3 F (36.8 C), Min:98.2 F (36.8 C), Max:98.3 F (36.8 C)  Recent Labs  Lab 04/24/17 1228 04/25/17 0936 04/25/17 1938 04/26/17 0622 04/29/17 1651  WBC 25.6* 40.1* 36.2* 34.1* 14.6*  CREATININE 1.23 1.22  --  1.01 1.25*  LATICACIDVEN 2.3*  --   --   --   --     Estimated Creatinine Clearance: 83.4 mL/min (A) (by C-G formula based on SCr of 1.25 mg/dL (H)).    No Known Allergies  Antimicrobials this admission: Vancomycin 04/30/2017 >> Levofloxacin  04/30/2017 >>  Zosyn 04/30/2017 >>   Dose adjustments this admission: -  Microbiology results: -  Thank you for allowing pharmacy to be a part of this patient's care.  Dean Webb, Dean Webb 04/30/2017 12:29 AM

## 2017-05-01 LAB — TYPE AND SCREEN
ABO/RH(D): O POS
ANTIBODY SCREEN: POSITIVE

## 2017-05-01 LAB — EXPECTORATED SPUTUM ASSESSMENT W REFEX TO RESP CULTURE

## 2017-05-01 LAB — EXPECTORATED SPUTUM ASSESSMENT W GRAM STAIN, RFLX TO RESP C

## 2017-05-01 NOTE — Progress Notes (Signed)
Subjective: A 42 year old gentle man with history of sickle cell disease who was readmitted with healthcare associated pneumonia and sickle cell painful crisis.   Objective: Vital signs in last 24 hours: Temp:  [98.2 F (36.8 C)-98.9 F (37.2 C)] 98.2 F (36.8 C) (03/19 0935) Pulse Rate:  [61-75] 64 (03/19 0935) Resp:  [10-15] 12 (03/19 0935) BP: (134-150)/(81-99) 134/81 (03/19 0935) SpO2:  [91 %-100 %] 100 % (03/19 0935) Weight change:  Last BM Date: 04/29/17  Intake/Output from previous day: 03/18 0701 - 03/19 0700 In: 480 [P.O.:480] Out: 700 [Urine:700] Intake/Output this shift: Total I/O In: 360 [P.O.:360] Out: 350 [Urine:350]  General appearance: alert, cooperative, appears stated age and no distress Back: symmetric, no curvature. ROM normal. No CVA tenderness. Resp: clear to auscultation bilaterally Cardio: regular rate and rhythm, S1, S2 normal, no murmur, click, rub or gallop GI: soft, non-tender; bowel sounds normal; no masses,  no organomegaly Extremities: extremities normal, atraumatic, no cyanosis or edema Pulses: 2+ and symmetric Skin: Skin color, texture, turgor normal. No rashes or lesions Neurologic: Grossly normal  Lab Results: Recent Labs    04/29/17 1651 04/30/17 0047  WBC 14.6* 15.9*  HGB 11.5* 10.6*  HCT 32.1* 29.7*  PLT 407* 318   BMET Recent Labs    04/29/17 1651  NA 138  K 3.6  CL 104  CO2 24  GLUCOSE 137*  BUN 11  CREATININE 1.25*  CALCIUM 10.8*    Studies/Results: Dg Chest 2 View  Result Date: 04/29/2017 CLINICAL DATA:  Chest pain and shortness of breath EXAM: CHEST - 2 VIEW COMPARISON:  April 24, 2017 FINDINGS: The heart size and mediastinal contours are within normal limits. Both lungs are clear. The visualized skeletal structures are unremarkable. IMPRESSION: No active cardiopulmonary disease. Electronically Signed   By: Gerome Samavid  Williams III M.D   On: 04/29/2017 17:32   Ct Angio Chest Pe W And/or Wo Contrast  Result Date:  04/29/2017 CLINICAL DATA:  42 year old male with chest pain and shortness of breath today. History of sickle cell disease. EXAM: CT ANGIOGRAPHY CHEST WITH CONTRAST TECHNIQUE: Multidetector CT imaging of the chest was performed using the standard protocol during bolus administration of intravenous contrast. Multiplanar CT image reconstructions and MIPs were obtained to evaluate the vascular anatomy. CONTRAST:  100mL ISOVUE-370 IOPAMIDOL (ISOVUE-370) INJECTION 76% COMPARISON:  Chest CT 03/08/2016. FINDINGS: Cardiovascular: No filling defects in the pulmonary arterial tree to suggest underlying pulmonary embolism. Heart size is borderline enlarged. There is no significant pericardial fluid, thickening or pericardial calcification. No atherosclerotic calcifications in the thoracic aorta or the coronary arteries. Mediastinum/Nodes: No pathologically enlarged mediastinal or hilar lymph nodes. Esophagus is mildly patulous. No axillary lymphadenopathy. Lungs/Pleura: Multifocal peribronchovascular ground-glass attenuation and nodularity noted throughout the lungs bilaterally (right greater than left), most compatible with multilobar bronchopneumonia. No pleural effusions. Upper Abdomen: Status post cholecystectomy. 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 Musculoskeletal: Old healed right clavicular fracture with posttraumatic deformity. There are no aggressive appearing lytic or blastic lesions noted in the visualized portions of the skeleton. Review of the MIP images confirms the above findings. IMPRESSION: 1. No evidence of pulmonary embolism. 2. Multilobar bronchopneumonia, as above. 3. Additional incidental findings, as above. Electronically Signed   By: Trudie Reedaniel  Entrikin M.D.   On: 04/29/2017 21:59    Medications: I have reviewed the patient's current medications.  Assessment/Plan: A 42 year old gentleman admitted with multilobar bronchopneumonia as an sickle cell crisis.  #1  multilobar pneumonia: Patient had recent acute chest syndrome.  He is better as of now.  Continue treatment for healthcare associated pneumonia due to recent hospitalization.  #2 sickle cell painful crisis:.  Continue Dilaudid PCA with Toradol and IV fluids.  Monitor closely  #3 anemia of chronic disease: H&H appears to be at baseline.  Continue monitoring  #4 hypertension: Blood pressure has been up and down.  Medications adjusted.  #5 history of asthma: No exacerbation.   #6 GERD: Continue PPIs  LOS: 2 days   Brier Firebaugh,LAWAL 05/01/2017, 10:48 AM

## 2017-05-02 LAB — COMPREHENSIVE METABOLIC PANEL
ALK PHOS: 105 U/L (ref 38–126)
ALT: 56 U/L (ref 17–63)
ANION GAP: 8 (ref 5–15)
AST: 27 U/L (ref 15–41)
Albumin: 4 g/dL (ref 3.5–5.0)
BUN: 8 mg/dL (ref 6–20)
CO2: 26 mmol/L (ref 22–32)
Calcium: 10.5 mg/dL — ABNORMAL HIGH (ref 8.9–10.3)
Chloride: 102 mmol/L (ref 101–111)
Creatinine, Ser: 1.1 mg/dL (ref 0.61–1.24)
GFR calc non Af Amer: >60 mL/min (ref >60)
Glucose, Bld: 101 mg/dL — ABNORMAL HIGH (ref 65–99)
Potassium: 4.1 mmol/L (ref 3.5–5.1)
SODIUM: 136 mmol/L (ref 135–145)
Total Bilirubin: 1.2 mg/dL (ref 0.3–1.2)
Total Protein: 7.6 g/dL (ref 6.5–8.1)

## 2017-05-02 LAB — CBC WITH DIFFERENTIAL/PLATELET
BASOS PCT: 0 %
Basophils Absolute: 0 10*3/uL (ref 0.0–0.1)
Eosinophils Absolute: 0.6 10*3/uL (ref 0.0–0.7)
Eosinophils Relative: 5 %
HCT: 32.7 % — ABNORMAL LOW (ref 39.0–52.0)
HEMOGLOBIN: 11.9 g/dL — AB (ref 13.0–17.0)
Lymphocytes Relative: 28 %
Lymphs Abs: 3.4 10*3/uL (ref 0.7–4.0)
MCH: 29 pg (ref 26.0–34.0)
MCHC: 36.4 g/dL — AB (ref 30.0–36.0)
MCV: 79.8 fL (ref 78.0–100.0)
MONO ABS: 1.2 10*3/uL — AB (ref 0.1–1.0)
Monocytes Relative: 10 %
NEUTROS ABS: 6.9 10*3/uL (ref 1.7–7.7)
Neutrophils Relative %: 57 %
PLATELETS: 440 10*3/uL — AB (ref 150–400)
RBC: 4.1 MIL/uL — ABNORMAL LOW (ref 4.22–5.81)
RDW: 17.3 % — ABNORMAL HIGH (ref 11.5–15.5)
WBC: 12.1 10*3/uL — ABNORMAL HIGH (ref 4.0–10.5)

## 2017-05-02 MED ORDER — KETOROLAC TROMETHAMINE 30 MG/ML IJ SOLN
30.0000 mg | Freq: Four times a day (QID) | INTRAMUSCULAR | Status: DC
  Administered 2017-05-02 – 2017-05-04 (×7): 30 mg via INTRAVENOUS
  Filled 2017-05-02 (×7): qty 1

## 2017-05-02 NOTE — Progress Notes (Signed)
Subjective: Patient continues to have pain at 8 out of 10.  Denied any nausea vomiting or diarrhea.  He has been on Dilaudid PCA with no Toradol but long-acting MS Contin.  He has responded to treatment but still has pain.  He is using oxygen but not hypoxic although he has some cough.  Objective: Vital signs in last 24 hours: Temp:  [98.1 F (36.7 C)-98.5 F (36.9 C)] 98.2 F (36.8 C) (03/20 1321) Pulse Rate:  [62-71] 62 (03/20 1321) Resp:  [10-16] 10 (03/20 1552) BP: (116-151)/(76-94) 116/78 (03/20 1321) SpO2:  [95 %-98 %] 97 % (03/20 1552) Weight change:  Last BM Date: 04/29/17  Intake/Output from previous day: 03/19 0701 - 03/20 0700 In: 960 [P.O.:960] Out: 1600 [Urine:1600] Intake/Output this shift: Total I/O In: 840 [P.O.:840] Out: 300 [Urine:300]  General appearance: alert, cooperative, appears stated age and no distress Back: symmetric, no curvature. ROM normal. No CVA tenderness. Resp: clear to auscultation bilaterally Cardio: regular rate and rhythm, S1, S2 normal, no murmur, click, rub or gallop GI: soft, non-tender; bowel sounds normal; no masses,  no organomegaly Extremities: extremities normal, atraumatic, no cyanosis or edema Pulses: 2+ and symmetric Skin: Skin color, texture, turgor normal. No rashes or lesions Neurologic: Grossly normal  Lab Results: Recent Labs    04/30/17 0047 05/02/17 0743  WBC 15.9* 12.1*  HGB 10.6* 11.9*  HCT 29.7* 32.7*  PLT 318 440*   BMET Recent Labs    05/02/17 0743  NA 136  K 4.1  CL 102  CO2 26  GLUCOSE 101*  BUN 8  CREATININE 1.10  CALCIUM 10.5*    Studies/Results: No results found.  Medications: I have reviewed the patient's current medications.  Assessment/Plan: A 42 year old gentleman admitted with multilobar bronchopneumonia as an sickle cell crisis.  #1 multilobar pneumonia: Patient will be maintained on IV Zosyn.  No evidence of MRSA.  No vancomycin at this point.  #2 sickle cell painful crisis:.   Continue Dilaudid PCA and addToradol and IV fluids.  Monitor closely  #3 anemia of chronic disease: H&H appears to be at baseline.  Continue monitoring  #4 hypertension: Blood pressure has been up and down.  Medications adjusted.  #5 history of asthma: No exacerbation.   #6 GERD: Continue PPIs   LOS: 3 days   Paige Vanderwoude,LAWAL 05/02/2017, 6:46 PM

## 2017-05-03 ENCOUNTER — Inpatient Hospital Stay (HOSPITAL_COMMUNITY): Payer: Medicaid Other

## 2017-05-03 DIAGNOSIS — K838 Other specified diseases of biliary tract: Secondary | ICD-10-CM

## 2017-05-03 DIAGNOSIS — G4733 Obstructive sleep apnea (adult) (pediatric): Secondary | ICD-10-CM

## 2017-05-03 LAB — COMPREHENSIVE METABOLIC PANEL
ALBUMIN: 3.7 g/dL (ref 3.5–5.0)
ALK PHOS: 97 U/L (ref 38–126)
ALT: 50 U/L (ref 17–63)
AST: 27 U/L (ref 15–41)
Anion gap: 8 (ref 5–15)
BILIRUBIN TOTAL: 1.1 mg/dL (ref 0.3–1.2)
BUN: 10 mg/dL (ref 6–20)
CALCIUM: 10.5 mg/dL — AB (ref 8.9–10.3)
CO2: 26 mmol/L (ref 22–32)
CREATININE: 1.23 mg/dL (ref 0.61–1.24)
Chloride: 101 mmol/L (ref 101–111)
GFR calc Af Amer: >60 mL/min (ref >60)
GFR calc non Af Amer: >60 mL/min (ref >60)
GLUCOSE: 100 mg/dL — AB (ref 65–99)
Potassium: 4.4 mmol/L (ref 3.5–5.1)
Sodium: 135 mmol/L (ref 135–145)
TOTAL PROTEIN: 7.1 g/dL (ref 6.5–8.1)

## 2017-05-03 LAB — CULTURE, RESPIRATORY W GRAM STAIN: Culture: NORMAL

## 2017-05-03 LAB — CBC WITH DIFFERENTIAL/PLATELET
BASOS ABS: 0 10*3/uL (ref 0.0–0.1)
Basophils Relative: 0 %
Eosinophils Absolute: 0.7 10*3/uL (ref 0.0–0.7)
Eosinophils Relative: 6 %
HEMATOCRIT: 32.7 % — AB (ref 39.0–52.0)
HEMOGLOBIN: 11.4 g/dL — AB (ref 13.0–17.0)
Lymphocytes Relative: 44 %
Lymphs Abs: 4.5 10*3/uL — ABNORMAL HIGH (ref 0.7–4.0)
MCH: 28 pg (ref 26.0–34.0)
MCHC: 34.9 g/dL (ref 30.0–36.0)
MCV: 80.3 fL (ref 78.0–100.0)
MONOS PCT: 10 %
Monocytes Absolute: 1.1 10*3/uL — ABNORMAL HIGH (ref 0.1–1.0)
NEUTROS ABS: 4.2 10*3/uL (ref 1.7–7.7)
NEUTROS PCT: 40 %
Platelets: 475 10*3/uL — ABNORMAL HIGH (ref 150–400)
RBC: 4.07 MIL/uL — AB (ref 4.22–5.81)
RDW: 17.1 % — ABNORMAL HIGH (ref 11.5–15.5)
WBC: 10.5 10*3/uL (ref 4.0–10.5)

## 2017-05-03 LAB — CULTURE, RESPIRATORY

## 2017-05-03 NOTE — Progress Notes (Signed)
Pharmacy Antibiotic Note  Dean Webb is a 42 y.o. male admitted on 04/29/2017 with chest pain, sickle cell anemia, and pneumonia.  Pharmacy has been consulted for Zosyn dosing.  Vanc and levaquin have been d/c.  Today, 05/03/2017: Total day #11 antibiotics,  Day #4 Zosyn Afebrile WBC WNL SCr 1.23  Plan: Zosyn 3.375g IV q8h (4 hour infusion).  Dosage remains stable and need for further dosage adjustment appears unlikely at present.  Pharmacy will sign off at this time.  Please reconsult if a change in clinical status warrants re-evaluation of dosage.  Height: 5\' 7"  (170.2 cm) Weight: 195 lb (88.5 kg) IBW/kg (Calculated) : 66.1  Temp (24hrs), Avg:98 F (36.7 C), Min:97.7 F (36.5 C), Max:98.2 F (36.8 C)  Recent Labs  Lab 04/29/17 1651 04/30/17 0047 05/02/17 0743 05/03/17 0421  WBC 14.6* 15.9* 12.1* 10.5  CREATININE 1.25*  --  1.10 1.23    Estimated Creatinine Clearance: 84.8 mL/min (by C-G formula based on SCr of 1.23 mg/dL).    No Known Allergies  Antimicrobials this admission: Vancomycin 04/30/2017 >> 3/18 Levofloxacin  04/30/2017 >> 3/18 Zosyn 04/30/2017 >>  (Recent abx) 3/12 Ceftriaxone/Azith >> 3/14 3/14 LVQ >> discharge on PO LVQ >> 3/17    Microbiology results: 3/18 BCx: ngtd 3/19 Sputum:  Few GPC, fw GNR, few GPR.  Cxt reincubated 3/18 MRSA PCR: negative 3/18 UA: no bacteria, no WBC   Thank you for allowing pharmacy to be a part of this patient's care.  Lynann Beaverhristine Hilery Wintle PharmD, BCPS Pager [phone] 05/03/2017 9:16 AM

## 2017-05-03 NOTE — Progress Notes (Signed)
Patient ID: Dean Webb, male   DOB: 01-13-1976, 42 y.o.   MRN: [id number] Subjective:  Patient still complaining of pain 7/10 but claims it's better than yesterday. He denies any SOB or chest pain. Denies any difficulty breathing. No fever   Objective:  Vital signs in last 24 hours:  Vitals:   05/03/17 0800 05/03/17 1221 05/03/17 1339 05/03/17 1600  BP:   125/81   Pulse:   60   Resp: 12 12 14 13   Temp:   99.3 F (37.4 C)   TempSrc:   Oral   SpO2: 96% 97% 100% 94%  Weight:      Height:        Intake/Output from previous day:   Intake/Output Summary (Last 24 hours) at 05/03/2017 1813 Last data filed at 05/03/2017 1800 Gross per 24 hour  Intake 128 ml  Output 2350 ml  Net -2222 ml    Physical Exam: General: Alert, awake, oriented x3, in no acute distress.  HEENT: [place]/AT PEERL, EOMI Neck: Trachea midline,  no masses, no thyromegal,y no JVD, no carotid bruit OROPHARYNX:  Moist, No exudate/ erythema/lesions.  Heart: Regular rate and rhythm, without murmurs, rubs, gallops, PMI non-displaced, no heaves or thrills on palpation.  Lungs: Clear to auscultation, no wheezing or rhonchi noted. No increased vocal fremitus resonant to percussion  Abdomen: Soft, nontender, nondistended, positive bowel sounds, no masses no hepatosplenomegaly noted..  Neuro: No focal neurological deficits noted cranial nerves II through XII grossly intact. DTRs 2+ bilaterally upper and lower extremities. Strength 5 out of 5 in bilateral upper and lower extremities. Musculoskeletal: No warm swelling or erythema around joints, no spinal tenderness noted. Psychiatric: Patient alert and oriented x3, good insight and cognition, good recent to remote recall. Lymph node survey: No cervical axillary or inguinal lymphadenopathy noted.  Lab Results:  Basic Metabolic Panel:    Component Value Date/Time   NA 135 05/03/2017 0421   K 4.4 05/03/2017 0421   CL 101 05/03/2017 0421   CO2 26 05/03/2017 0421   BUN 10  05/03/2017 0421   CREATININE 1.23 05/03/2017 0421   GLUCOSE 100 (H) 05/03/2017 0421   CALCIUM 10.5 (H) 05/03/2017 0421   CBC:    Component Value Date/Time   WBC 10.5 05/03/2017 0421   HGB 11.4 (L) 05/03/2017 0421   HCT 32.7 (L) 05/03/2017 0421   PLT 475 (H) 05/03/2017 0421   MCV 80.3 05/03/2017 0421   NEUTROABS 4.2 05/03/2017 0421   LYMPHSABS 4.5 (H) 05/03/2017 0421   MONOABS 1.1 (H) 05/03/2017 0421   EOSABS 0.7 05/03/2017 0421   BASOSABS 0.0 05/03/2017 0421    Recent Results (from the past 240 hour(s))  Culture, blood (routine x 2) Call MD if unable to obtain prior to antibiotics being given     Status: None   Collection Time: 04/24/17 12:39 PM  Result Value Ref Range Status   Specimen Description   Final    BLOOD LEFT ARM Performed at [facility], [address], [place], [place] [address]    Special Requests   Final    BOTTLES DRAWN AEROBIC AND ANAEROBIC Blood Culture adequate volume Performed at [facility], [address], [place], [place] [address]    Culture   Final    NO GROWTH 5 DAYS Performed at [facility], [address], [place], [place] [address]    Report Status 04/29/2017 FINAL  Final  Culture, blood (routine x 2) Call MD if unable to  obtain prior to antibiotics being given     Status: None   Collection Time: 04/24/17  1:02 PM  Result Value Ref Range Status   Specimen Description   Final    BLOOD RIGHT HAND Performed at [facility], [address], [place], [place] [address]    Special Requests   Final    BOTTLES DRAWN AEROBIC AND ANAEROBIC Blood Culture adequate volume Performed at [facility], [address], [place], [place] [address]    Culture   Final    NO GROWTH 5 DAYS Performed at [facility], [address] [place], [place] [address]    Report Status 04/29/2017 FINAL  Final  Culture, blood (routine x 2)     Status: None (Preliminary result)    Collection Time: 04/30/17 12:47 AM  Result Value Ref Range Status   Specimen Description   Final    BLOOD BLOOD LEFT FOREARM Performed at [facility], [address] [place], [place] [address]    Special Requests   Final    BOTTLES DRAWN AEROBIC ONLY Blood Culture adequate volume Performed at [facility], [address], [place], [place] [address]    Culture   Final    NO GROWTH 3 DAYS Performed at [facility], [address] [place], [place] [address]    Report Status PENDING  Incomplete  Culture, blood (routine x 2)     Status: None (Preliminary result)   Collection Time: 04/30/17 12:47 AM  Result Value Ref Range Status   Specimen Description   Final    BLOOD LEFT ANTECUBITAL Performed at [facility], [address] [place], [place] [address]    Special Requests   Final    BOTTLES DRAWN AEROBIC ONLY Blood Culture adequate volume Performed at [facility], [address], [place], [place] [address]    Culture   Final    NO GROWTH 3 DAYS Performed at [facility], [address], [place], [place] [address]    Report Status PENDING  Incomplete  MRSA PCR Screening     Status: None   Collection Time: 04/30/17 12:54 AM  Result Value Ref Range Status   MRSA by PCR NEGATIVE NEGATIVE Final    Comment:        The GeneXpert MRSA Assay (FDA approved for NASAL specimens only), is one component of a comprehensive MRSA colonization surveillance program. It is not intended to diagnose MRSA infection nor to guide or monitor treatment for MRSA infections. Performed at [facility], [address] [place], [place] [address]   Culture, expectorated sputum-assessment     Status: None   Collection Time: 05/01/17  9:30 AM  Result Value Ref Range Status   Specimen Description EXPECTORATED SPUTUM  Final   Special Requests NONE  Final   Sputum evaluation   Final    THIS  SPECIMEN IS ACCEPTABLE FOR SPUTUM CULTURE Performed at [facility], [address] [place], [place] [address]    Report Status 05/01/2017 FINAL  Final  Culture, respiratory (NON-Expectorated)     Status: None   Collection Time: 05/01/17  9:30 AM  Result Value Ref Range Status   Specimen Description   Final    EXPECTORATED SPUTUM Performed at [facility], [address], [place], [place] [address]    Special Requests   Final    NONE Reflexed from [phone] Performed  at [facility], [address] [place], [place] [address]    Gram Stain   Final    RARE WBC PRESENT, PREDOMINANTLY PMN MODERATE SQUAMOUS EPITHELIAL CELLS PRESENT FEW GRAM POSITIVE COCCI FEW GRAM NEGATIVE RODS FEW GRAM POSITIVE RODS    Culture   Final    Consistent with normal respiratory flora. Performed at [facility], [address] [place], [place] [address]    Report Status 05/03/2017 FINAL  Final    Studies/Results: Dg Chest 2 View  Result Date: 05/03/2017 CLINICAL DATA:  Dyspnea and hypoxia, sickle cell EXAM: CHEST - 2 VIEW COMPARISON:  04/29/2017 CT FINDINGS: The heart size and mediastinal contours are within normal limits. Faint acinar densities in the right upper, middle and lower lobes as noted on prior CT are redemonstrated radiographically and compatible with small foci of bronchopneumonia. The visualized skeletal structures are unremarkable. IMPRESSION: Faint acinar opacities in the right lung compatible with small foci of bronchopneumonia. Electronically Signed   By: Tollie Ethavid  Kwon M.D.   On: 05/03/2017 17:45    Medications: Scheduled Meds: . amLODipine  10 mg Oral Daily  . HYDROmorphone   Intravenous Q4H  . ketorolac  30 mg Intravenous Q6H  . morphine  60 mg Oral Q12H  . senna-docusate  1 tablet Oral BID   Continuous Infusions: . piperacillin-tazobactam (ZOSYN)  IV 3.375 g (05/03/17 1413)   PRN Meds:.diphenhydrAMINE **OR**  [DISCONTINUED] diphenhydrAMINE, hydrALAZINE, naloxone **AND** sodium chloride flush, ondansetron (ZOFRAN) IV, polyethylene glycol  Assessment/Plan: Principal Problem:   Sickle cell pain crisis (HCC) Active Problems:   Essential hypertension   OSA (obstructive sleep apnea)  1. Multilobar Pneumonia: Patient had acute chest syndrome on previous admission and is s/p partial exchange transfusion with significant improvement of Oxygenation but readmitted now for multilobar pneumonia. Slowly improving. Oxygenation is better. Continue IV Antibiotics. Vanc and Levaquin D/Ced 2. Hypoxia: Secondary to Multilobar Pneumonia. Significantly improved. Now on RA.  3. Sickle Cell Painful Crisis: Continue IVF, IV Dilaudid PCA and Toradol 4. HTN: BP is now controlled, continue current medications 5. Hb SS with Chronic Pain: Pain controlled with current medications. Continue MS Contin at 30 mg and Oxycodone on a PRN basis.  Code Status: Full Code Family Communication: N/A Disposition Plan: Not yet ready for discharge  Eleaner Dibartolo  If 7PM-7AM, please contact night-coverage.  05/03/2017, 6:13 PM  LOS: 4 days

## 2017-05-04 DIAGNOSIS — R072 Precordial pain: Secondary | ICD-10-CM

## 2017-05-04 MED ORDER — LEVOFLOXACIN 750 MG PO TABS: 750.0000 mg | ORAL_TABLET | Freq: Every day | ORAL | 0 refills | Status: AC

## 2017-05-04 MED ORDER — OXYCODONE HCL 30 MG PO TABS: 30.0000 mg | ORAL_TABLET | ORAL | 0 refills | Status: DC | PRN

## 2017-05-04 MED ORDER — MORPHINE SULFATE ER 60 MG PO TBCR: 60.0000 mg | EXTENDED_RELEASE_TABLET | Freq: Two times a day (BID) | ORAL | 0 refills | Status: DC

## 2017-05-04 NOTE — Discharge Summary (Signed)
Physician Discharge Summary  Dean Webb ZOX:[id number] DOB: 1975/04/23 DOA: 04/29/2017  PCP: Dean Webb  Admit date: 04/29/2017  Discharge date: 05/04/2017  Discharge Diagnoses:  Principal Problem:   Sickle cell pain crisis (HCC) Active Problems:   Essential hypertension   OSA (obstructive sleep apnea)   Pneumobilia  Discharge Condition: Stable  Disposition:  Follow-up Information    [facility]. Call in 3 day(s).   Specialty:  Internal Medicine Contact information: [address] [place] [address] [phone]         Pt is discharged home in good condition and is to follow up with Dean Webb this week to have labs evaluated. He is instructed to increase activity slowly and balance with rest for the next few days, and use prescribed medication to complete treatment of pain  Diet: Regular  Wt Readings from Last 3 Encounters:  05/04/17 81.5 kg (179 lb 10.8 oz)  04/22/17 89.4 kg (197 lb)  03/07/16 89.4 kg (197 lb)   History of present illness:  Dean Webb is a 42 y.o. male with history of sickle cell anemia, hypertension who was discharged 2 days ago after being treated for sickle cell pain crisis with pneumonia and acute chest syndrome presents to the ER after being having persistent chest pain since discharge.  Patient states he has been anxious his chest pain and increases on coughing and also had at times vomiting.  He has been bringing out discolored sputum on coughing.  Denies any fever chills or difficulty breathing.  Denies any headache visual symptoms or any focal deficits.  Denies any abdominal pain or diarrhea.  Has been taking his Levaquin and other medications.  ED Course: In the ER patient was not hypoxic or febrile.  Since there was concern for being having persistent chest pain and possibility of pulmonary embolism CT angiogram was done which shows multifocal pneumonia.  On exam patient is not  hypoxic and not febrile.  Patient admitted for sickle cell pain crisis with pneumonia and possible acute chest syndrome.  Hospital Course:  Patient was admitted for multilobar pneumonia and sickle cell pain crisis. Patient was discharged 2 days prior to this admission on oral antibiotics after few days admission for ACS and Pneumonia. He was restarted on IV Antibiotics and IVF as well as standard sickle cell pain management protocol. He did well, hypoxia resolved, no fever, he was hemodynamically stable.BP was controlled. He was discharged home in a hemodynamically stable condition. He will follow up with PCP within one week of this discharge. He will complete a course of oral Augmentin.  Discharge Exam: Vitals:   05/04/17 0607 05/04/17 0959  BP: 121/76   Pulse: 60   Resp: 10 14  Temp: 98.2 F (36.8 C)   SpO2: 99% 97%   Vitals:   05/04/17 0200 05/04/17 0435 05/04/17 0607 05/04/17 0959  BP: 115/72  121/76   Pulse: 61  60   Resp: 10 18 10 14   Temp: 98.1 F (36.7 C)  98.2 F (36.8 C)   TempSrc: Oral  Oral   SpO2: 97% 96% 99% 97%  Weight:   81.5 kg (179 lb 10.8 oz)   Height:       General appearance : Awake, alert, not in any distress. Speech Clear. Not toxic looking HEENT: Atraumatic and Normocephalic, pupils equally reactive to light and accomodation Neck: Supple, no JVD. No cervical lymphadenopathy.  Chest: Good air entry bilaterally, no added sounds  CVS: S1 S2 regular, no murmurs.  Abdomen: Bowel sounds present, Non tender and not distended with no gaurding, rigidity or rebound. Extremities: B/L Lower Ext shows no edema, both legs are warm to touch Neurology: Awake alert, and oriented X 3, CN II-XII intact, Non focal Skin: No Rash  Discharge Instructions  Discharge Instructions    Diet - low sodium heart healthy   Complete by:  As directed    Increase activity slowly   Complete by:  As directed      Allergies as of 05/04/2017   No Known Allergies     Medication List     TAKE these medications   albuterol 108 (90 Base) MCG/ACT inhaler Commonly known as:  PROVENTIL HFA;VENTOLIN HFA Inhale 2 puffs into the lungs every 6 (six) hours as needed for wheezing or shortness of breath.   amLODipine 10 MG tablet Commonly known as:  NORVASC Take 1 tablet (10 mg total) by mouth daily.   cetirizine 10 MG tablet Commonly known as:  ZYRTEC Take 1 tablet (10 mg total) by mouth daily as needed for allergies.   ibuprofen 800 MG tablet Commonly known as:  ADVIL,MOTRIN Take 1 tablet (800 mg total) by mouth every 8 (eight) hours as needed. What changed:  reasons to take this   levofloxacin 750 MG tablet Commonly known as:  LEVAQUIN Take 1 tablet (750 mg total) by mouth daily for 5 days.   morphine 60 MG 12 hr tablet Commonly known as:  MS CONTIN Take 1 tablet (60 mg total) by mouth every 12 (twelve) hours.   oxycodone 30 MG immediate release tablet Commonly known as:  ROXICODONE Take 1 tablet (30 mg total) by mouth every 4 (four) hours as needed for pain. What changed:  additional instructions      The results of significant diagnostics from this hospitalization (including imaging, microbiology, ancillary and laboratory) are listed below for reference.    Significant Diagnostic Studies: Dg Chest 2 View  Result Date: 05/03/2017 CLINICAL DATA:  Dyspnea and hypoxia, sickle cell EXAM: CHEST - 2 VIEW COMPARISON:  04/29/2017 CT FINDINGS: The heart size and mediastinal contours are within normal limits. Faint acinar densities in the right upper, middle and lower lobes as noted on prior CT are redemonstrated radiographically and compatible with small foci of bronchopneumonia. The visualized skeletal structures are unremarkable. IMPRESSION: Faint acinar opacities in the right lung compatible with small foci of bronchopneumonia. Electronically Signed   By: Tollie Eth M.D.   On: 05/03/2017 17:45   Dg Chest 2 View  Result Date: 04/29/2017 CLINICAL DATA:  Chest pain and  shortness of breath EXAM: CHEST - 2 VIEW COMPARISON:  April 24, 2017 FINDINGS: The heart size and mediastinal contours are within normal limits. Both lungs are clear. The visualized skeletal structures are unremarkable. IMPRESSION: No active cardiopulmonary disease. Electronically Signed   By: Gerome Sam III M.D   On: 04/29/2017 17:32   Dg Chest 2 View  Result Date: 04/24/2017 CLINICAL DATA:  Hypoxia.  History of sickle cell disease EXAM: CHEST - 2 VIEW COMPARISON:  April 22, 2017 FINDINGS: There is airspace consolidation throughout the right lower lobe. Lungs elsewhere are clear. Heart is upper normal in size with pulmonary vascularity within normal limits. No adenopathy. There is an old healed fracture of the right clavicle. There is thoracolumbar levoscoliosis. Gallbladder is absent. No focal bone lesions evident. IMPRESSION: Extensive airspace consolidation consistent with pneumonia right lower lobe. Lungs elsewhere clear. Heart upper normal in size. No  vascular congestion or adenopathy evident. Followup PA and lateral chest radiographs recommended in 3-4 weeks following trial of antibiotic therapy to ensure resolution and exclude underlying malignancy. Electronically Signed   By: Bretta Bang III M.D.   On: 04/24/2017 11:48   Dg Chest 2 View  Result Date: 04/22/2017 CLINICAL DATA:  42 y/o M; cough and chest pain for a couple hours. History of sickle cell disease, hypertension, asthma. EXAM: CHEST - 2 VIEW COMPARISON:  03/07/2016 chest radiograph. FINDINGS: Stable heart size and mediastinal contours are within normal limits. Both lungs are clear. The visualized skeletal structures are unremarkable. IMPRESSION: No acute pulmonary process identified. Electronically Signed   By: Mitzi Hansen M.D.   On: 04/22/2017 05:43   Ct Angio Chest Pe W And/or Wo Contrast  Result Date: 04/29/2017 CLINICAL DATA:  42 year old male with chest pain and shortness of breath today. History of sickle  cell disease. EXAM: CT ANGIOGRAPHY CHEST WITH CONTRAST TECHNIQUE: Multidetector CT imaging of the chest was performed using the standard protocol during bolus administration of intravenous contrast. Multiplanar CT image reconstructions and MIPs were obtained to evaluate the vascular anatomy. CONTRAST:  ISOVUE-370 IOPAMIDOL (ISOVUE-370) INJECTION 76% COMPARISON:  Chest CT 03/08/2016. FINDINGS: Cardiovascular: No filling defects in the pulmonary arterial tree to suggest underlying pulmonary embolism. Heart size is borderline enlarged. There is no significant pericardial fluid, thickening or pericardial calcification. No atherosclerotic calcifications in the thoracic aorta or the coronary arteries. Mediastinum/Nodes: No pathologically enlarged mediastinal or hilar lymph nodes. Esophagus is mildly patulous. No axillary lymphadenopathy. Lungs/Pleura: Multifocal peribronchovascular ground-glass attenuation and nodularity noted throughout the lungs bilaterally (right greater than left), most compatible with multilobar bronchopneumonia. No pleural effusions. Upper Abdomen: Status post cholecystectomy. 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 Musculoskeletal: Old healed right clavicular fracture with posttraumatic deformity. There are no aggressive appearing lytic or blastic lesions noted in the visualized portions of the skeleton. Review of the MIP images confirms the above findings. IMPRESSION: 1. No evidence of pulmonary embolism. 2. Multilobar bronchopneumonia, as above. 3. Additional incidental findings, as above. Electronically Signed   By: Trudie Reed M.D.   On: 04/29/2017 21:59    Microbiology: Recent Results (from the past 240 hour(s))  Culture, blood (routine x 2) Call Webb if unable to obtain prior to antibiotics being given     Status: None   Collection Time: 04/24/17 12:39 PM  Result Value Ref Range Status   Specimen Description   Final    BLOOD LEFT ARM Performed  at [facility], [address], [place], [place] [address]    Special Requests   Final    BOTTLES DRAWN AEROBIC AND ANAEROBIC Blood Culture adequate volume Performed at [facility], [address] [place], [place] [address]    Culture   Final    NO GROWTH 5 DAYS Performed at [facility], [address], [place], [place] [address]    Report Status 04/29/2017 FINAL  Final  Culture, blood (routine x 2) Call Webb if unable to obtain prior to antibiotics being given     Status: None   Collection Time: 04/24/17  1:02 PM  Result Value Ref Range Status   Specimen Description   Final    BLOOD RIGHT HAND Performed at [facility], [address], [place], [place] [address]    Special Requests   Final    BOTTLES DRAWN AEROBIC AND ANAEROBIC Blood Culture adequate volume Performed at [facility], [address] [place], [place] [address]    Culture   Final    NO GROWTH 5 DAYS Performed at [facility], [address] [place], [place] [address]    Report Status 04/29/2017 FINAL  Final  Culture, blood (routine x 2)     Status: None (Preliminary result)   Collection Time: 04/30/17 12:47 AM  Result Value Ref Range Status   Specimen Description   Final    BLOOD BLOOD LEFT FOREARM Performed at [facility], [address] [place], [place] [address]    Special Requests   Final    BOTTLES DRAWN AEROBIC ONLY Blood Culture adequate volume Performed at [facility], [address] [place], [place] [address]    Culture   Final    NO GROWTH 3 DAYS Performed at [facility], [address] [place], [place] [address]    Report Status PENDING  Incomplete  Culture, blood (routine x 2)     Status: None (Preliminary result)   Collection Time: 04/30/17 12:47 AM  Result Value Ref Range Status   Specimen Description   Final    BLOOD LEFT ANTECUBITAL Performed at  [facility], [address] [place], [place] [address]    Special Requests   Final    BOTTLES DRAWN AEROBIC ONLY Blood Culture adequate volume Performed at [facility], [address] [place], [place] [address]    Culture   Final    NO GROWTH 3 DAYS Performed at [facility], [address] [place], [place] [address]    Report Status PENDING  Incomplete  MRSA PCR Screening     Status: None   Collection Time: 04/30/17 12:54 AM  Result Value Ref Range Status   MRSA by PCR NEGATIVE NEGATIVE Final    Comment:        The GeneXpert MRSA Assay (FDA approved for NASAL specimens only), is one component of a comprehensive MRSA colonization surveillance program. It is not intended to diagnose MRSA infection nor to guide or monitor treatment for MRSA infections. Performed at [facility], [address] [place], [place] [address]   Culture, expectorated sputum-assessment     Status: None   Collection Time: 05/01/17  9:30 AM  Result Value Ref Range Status   Specimen Description EXPECTORATED SPUTUM  Final   Special Requests NONE  Final   Sputum evaluation   Final    THIS SPECIMEN IS ACCEPTABLE FOR SPUTUM CULTURE Performed at [facility], [address] [place], [place] [address]    Report Status 05/01/2017 FINAL  Final  Culture, respiratory (NON-Expectorated)     Status: None   Collection Time: 05/01/17  9:30 AM  Result Value Ref Range Status   Specimen Description   Final    EXPECTORATED SPUTUM Performed at [facility], [address] [place], [place] [address]    Special Requests   Final    NONE Reflexed from [id number] Performed at [facility], [address] [place], [place] [address]    Gram Stain   Final    RARE WBC PRESENT, PREDOMINANTLY PMN MODERATE SQUAMOUS EPITHELIAL CELLS PRESENT FEW GRAM POSITIVE COCCI FEW GRAM NEGATIVE RODS FEW GRAM  POSITIVE RODS    Culture   Final    Consistent with normal respiratory flora. Performed at [facility], [address] [place], [place] [address]  Report Status 05/03/2017 FINAL  Final     Labs: Basic Metabolic Panel: Recent Labs  Lab 04/29/17 1651 05/02/17 0743 05/03/17 0421  NA 138 136 135  K 3.6 4.1 4.4  CL 104 102 101  CO2 24 26 26   GLUCOSE 137* 101* 100*  BUN 11 8 10   CREATININE 1.25* 1.10 1.23  CALCIUM 10.8* 10.5* 10.5*   Liver Function Tests: Recent Labs  Lab 04/29/17 1651 05/02/17 0743 05/03/17 0421  AST 62* 27 27  ALT 91* 56 50  ALKPHOS 121 105 97  BILITOT 1.3* 1.2 1.1  PROT 7.5 7.6 7.1  ALBUMIN 3.8 4.0 3.7   No results for input(s): LIPASE, AMYLASE in the last 168 hours. No results for input(s): AMMONIA in the last 168 hours. CBC: Recent Labs  Lab 04/29/17 1651 04/30/17 0047 05/02/17 0743 05/03/17 0421  WBC 14.6* 15.9* 12.1* 10.5  NEUTROABS 7.6  --  6.9 4.2  HGB 11.5* 10.6* 11.9* 11.4*  HCT 32.1* 29.7* 32.7* 32.7*  MCV 79.7 79.2 79.8 80.3  PLT 407* 318 440* 475*   Cardiac Enzymes: Recent Labs  Lab 04/29/17 1700 04/30/17 0047  TROPONINI <0.03 <0.03   BNP: Invalid input(s): POCBNP CBG: No results for input(s): GLUCAP in the last 168 hours.  Time coordinating discharge: 50 minutes  Signed:  Claudie Rathbone  [facility] 05/04/2017, 11:45 AM

## 2017-05-04 NOTE — Progress Notes (Signed)
Nursing Discharge Summary  Patient ID: Marveen ReeksDonald R Hockenbury MRN: [id number] DOB/AGE: [birth year] 41 y.o.  Admit date: 04/29/2017 Discharge date: 05/04/2017  Discharged Condition: good  Disposition: Discharge disposition: 01-Home or Self Care       Follow-up Information    [facility]. Call in 3 day(s).   Specialty:  Internal Medicine Contact information: [address]  [place] [id number] [phone]          Prescriptions Given: Prescriptions called into pharmacy.  Patient's discharge instructions and medications reviewed.  Patient follow up appointments discussed.  Patient verbalized understanding without further questions.   Means of Discharge: Patient stated he would ambulate downstairs and he is calling public transportation for discharge.    Signed: Gloriajean DellBaldwin, Beryl Balz Danielle 05/04/2017, 12:28 PM

## 2017-05-05 LAB — CULTURE, BLOOD (ROUTINE X 2)
Culture: NO GROWTH
Culture: NO GROWTH
SPECIAL REQUESTS: ADEQUATE
Special Requests: ADEQUATE

## 2017-07-10 ENCOUNTER — Inpatient Hospital Stay (HOSPITAL_COMMUNITY)
Admission: EM | Admit: 2017-07-10 | Discharge: 2017-07-13 | DRG: 812 | Disposition: A | Discharge status: Home / Self Care | Payer: Medicaid Other | Attending: Internal Medicine | Admitting: Internal Medicine

## 2017-07-10 ENCOUNTER — Encounter (HOSPITAL_COMMUNITY): Payer: Self-pay

## 2017-07-10 ENCOUNTER — Other Ambulatory Visit: Payer: Self-pay

## 2017-07-10 DIAGNOSIS — Z8701 Personal history of pneumonia (recurrent): Secondary | ICD-10-CM

## 2017-07-10 DIAGNOSIS — J45909 Unspecified asthma, uncomplicated: Secondary | ICD-10-CM | POA: Diagnosis present

## 2017-07-10 DIAGNOSIS — Z833 Family history of diabetes mellitus: Secondary | ICD-10-CM

## 2017-07-10 DIAGNOSIS — G4733 Obstructive sleep apnea (adult) (pediatric): Secondary | ICD-10-CM | POA: Diagnosis present

## 2017-07-10 DIAGNOSIS — Z79899 Other long term (current) drug therapy: Secondary | ICD-10-CM

## 2017-07-10 DIAGNOSIS — Z825 Family history of asthma and other chronic lower respiratory diseases: Secondary | ICD-10-CM

## 2017-07-10 DIAGNOSIS — F17210 Nicotine dependence, cigarettes, uncomplicated: Secondary | ICD-10-CM | POA: Diagnosis present

## 2017-07-10 DIAGNOSIS — D5700 Hb-SS disease with crisis, unspecified: Secondary | ICD-10-CM | POA: Diagnosis present

## 2017-07-10 DIAGNOSIS — I10 Essential (primary) hypertension: Secondary | ICD-10-CM | POA: Diagnosis present

## 2017-07-10 DIAGNOSIS — Z79891 Long term (current) use of opiate analgesic: Secondary | ICD-10-CM

## 2017-07-10 DIAGNOSIS — Z8249 Family history of ischemic heart disease and other diseases of the circulatory system: Secondary | ICD-10-CM

## 2017-07-10 DIAGNOSIS — Z832 Family history of diseases of the blood and blood-forming organs and certain disorders involving the immune mechanism: Secondary | ICD-10-CM

## 2017-07-10 DIAGNOSIS — D57219 Sickle-cell/Hb-C disease with crisis, unspecified: Principal | ICD-10-CM | POA: Diagnosis present

## 2017-07-10 DIAGNOSIS — Z9049 Acquired absence of other specified parts of digestive tract: Secondary | ICD-10-CM

## 2017-07-10 LAB — CBC WITH DIFFERENTIAL/PLATELET
Basophils Absolute: 0 10*3/uL (ref 0.0–0.1)
Basophils Relative: 0 %
EOS ABS: 0.4 10*3/uL (ref 0.0–0.7)
Eosinophils Relative: 3 %
HEMATOCRIT: 35.8 % — AB (ref 39.0–52.0)
HEMOGLOBIN: 13.2 g/dL (ref 13.0–17.0)
LYMPHS ABS: 3.7 10*3/uL (ref 0.7–4.0)
Lymphocytes Relative: 32 %
MCH: 29.2 pg (ref 26.0–34.0)
MCHC: 36.9 g/dL — AB (ref 30.0–36.0)
MCV: 79.2 fL (ref 78.0–100.0)
MONOS PCT: 11 %
Monocytes Absolute: 1.3 10*3/uL — ABNORMAL HIGH (ref 0.1–1.0)
NEUTROS PCT: 53 %
Neutro Abs: 6.3 10*3/uL (ref 1.7–7.7)
Platelets: 274 10*3/uL (ref 150–400)
RBC: 4.52 MIL/uL (ref 4.22–5.81)
RDW: 17.2 % — ABNORMAL HIGH (ref 11.5–15.5)
WBC: 11.8 10*3/uL — ABNORMAL HIGH (ref 4.0–10.5)

## 2017-07-10 LAB — COMPREHENSIVE METABOLIC PANEL
ALBUMIN: 4.4 g/dL (ref 3.5–5.0)
ALT: 15 U/L — AB (ref 17–63)
AST: 21 U/L (ref 15–41)
Alkaline Phosphatase: 80 U/L (ref 38–126)
Anion gap: 7 (ref 5–15)
BUN: 11 mg/dL (ref 6–20)
CHLORIDE: 104 mmol/L (ref 101–111)
CO2: 25 mmol/L (ref 22–32)
Calcium: 10.3 mg/dL (ref 8.9–10.3)
Creatinine, Ser: 1.07 mg/dL (ref 0.61–1.24)
GFR calc non Af Amer: >60 mL/min (ref >60)
Glucose, Bld: 94 mg/dL (ref 65–99)
Potassium: 3.9 mmol/L (ref 3.5–5.1)
SODIUM: 136 mmol/L (ref 135–145)
Total Bilirubin: 1.4 mg/dL — ABNORMAL HIGH (ref 0.3–1.2)
Total Protein: 7.4 g/dL (ref 6.5–8.1)

## 2017-07-10 LAB — RETICULOCYTES
RBC.: 4.52 MIL/uL (ref 4.22–5.81)
RETIC CT PCT: 4 % — AB (ref 0.4–3.1)
Retic Count, Absolute: 180.8 10*3/uL (ref 19.0–186.0)

## 2017-07-10 NOTE — ED Triage Notes (Addendum)
Patient c/o sickle cell pain of the abdomen, right hip with radiating pain down the right leg, patient denies any SOB or chest pain.  Patient states he ran out Oxycodone and Morphine 2 days ago.

## 2017-07-11 ENCOUNTER — Encounter (HOSPITAL_COMMUNITY): Payer: Self-pay

## 2017-07-11 ENCOUNTER — Emergency Department (HOSPITAL_COMMUNITY): Payer: Medicaid Other

## 2017-07-11 DIAGNOSIS — Z825 Family history of asthma and other chronic lower respiratory diseases: Secondary | ICD-10-CM | POA: Diagnosis not present

## 2017-07-11 DIAGNOSIS — D57219 Sickle-cell/Hb-C disease with crisis, unspecified: Secondary | ICD-10-CM | POA: Diagnosis present

## 2017-07-11 DIAGNOSIS — D5700 Hb-SS disease with crisis, unspecified: Secondary | ICD-10-CM | POA: Diagnosis present

## 2017-07-11 DIAGNOSIS — Z832 Family history of diseases of the blood and blood-forming organs and certain disorders involving the immune mechanism: Secondary | ICD-10-CM | POA: Diagnosis not present

## 2017-07-11 DIAGNOSIS — Z833 Family history of diabetes mellitus: Secondary | ICD-10-CM | POA: Diagnosis not present

## 2017-07-11 DIAGNOSIS — Z9049 Acquired absence of other specified parts of digestive tract: Secondary | ICD-10-CM | POA: Diagnosis not present

## 2017-07-11 DIAGNOSIS — Z79899 Other long term (current) drug therapy: Secondary | ICD-10-CM | POA: Diagnosis not present

## 2017-07-11 DIAGNOSIS — Z8701 Personal history of pneumonia (recurrent): Secondary | ICD-10-CM | POA: Diagnosis not present

## 2017-07-11 DIAGNOSIS — F17210 Nicotine dependence, cigarettes, uncomplicated: Secondary | ICD-10-CM | POA: Diagnosis present

## 2017-07-11 DIAGNOSIS — J45909 Unspecified asthma, uncomplicated: Secondary | ICD-10-CM | POA: Diagnosis present

## 2017-07-11 DIAGNOSIS — Z79891 Long term (current) use of opiate analgesic: Secondary | ICD-10-CM | POA: Diagnosis not present

## 2017-07-11 DIAGNOSIS — I10 Essential (primary) hypertension: Secondary | ICD-10-CM | POA: Diagnosis present

## 2017-07-11 DIAGNOSIS — Z8249 Family history of ischemic heart disease and other diseases of the circulatory system: Secondary | ICD-10-CM | POA: Diagnosis not present

## 2017-07-11 DIAGNOSIS — G4733 Obstructive sleep apnea (adult) (pediatric): Secondary | ICD-10-CM | POA: Diagnosis present

## 2017-07-11 LAB — LIPASE, BLOOD: Lipase: 27 U/L (ref 11–51)

## 2017-07-11 MED ORDER — ENOXAPARIN SODIUM 40 MG/0.4ML ~~LOC~~ SOLN
40.0000 mg | SUBCUTANEOUS | Status: DC
  Administered 2017-07-11 – 2017-07-13 (×3): 40 mg via SUBCUTANEOUS
  Filled 2017-07-11 (×3): qty 0.4

## 2017-07-11 MED ORDER — MORPHINE SULFATE ER 30 MG PO TBCR
60.0000 mg | EXTENDED_RELEASE_TABLET | Freq: Two times a day (BID) | ORAL | Status: DC
  Administered 2017-07-11 – 2017-07-13 (×5): 60 mg via ORAL
  Filled 2017-07-11 (×5): qty 2

## 2017-07-11 MED ORDER — IOPAMIDOL (ISOVUE-300) INJECTION 61%
INTRAVENOUS | Status: AC
  Filled 2017-07-11: qty 100

## 2017-07-11 MED ORDER — ONDANSETRON HCL 4 MG/2ML IJ SOLN
4.0000 mg | INTRAMUSCULAR | Status: DC | PRN
  Administered 2017-07-11 – 2017-07-12 (×3): 4 mg via INTRAVENOUS
  Filled 2017-07-11 (×3): qty 2

## 2017-07-11 MED ORDER — SODIUM CHLORIDE 0.9 % IV SOLN
25.0000 mg | INTRAVENOUS | Status: DC | PRN
  Filled 2017-07-11: qty 0.5

## 2017-07-11 MED ORDER — DIPHENHYDRAMINE HCL 25 MG PO CAPS
25.0000 mg | ORAL_CAPSULE | ORAL | Status: DC | PRN
  Administered 2017-07-11: 25 mg via ORAL
  Filled 2017-07-11: qty 1

## 2017-07-11 MED ORDER — DEXTROSE-NACL 5-0.45 % IV SOLN
INTRAVENOUS | Status: DC
  Administered 2017-07-11 – 2017-07-13 (×4): via INTRAVENOUS

## 2017-07-11 MED ORDER — KETOROLAC TROMETHAMINE 30 MG/ML IJ SOLN
30.0000 mg | Freq: Four times a day (QID) | INTRAMUSCULAR | Status: DC
  Administered 2017-07-11 – 2017-07-12 (×6): 30 mg via INTRAVENOUS
  Filled 2017-07-11 (×6): qty 1

## 2017-07-11 MED ORDER — HYDROMORPHONE HCL 2 MG/ML IJ SOLN: 2.0000 mg | INTRAMUSCULAR | Status: DC

## 2017-07-11 MED ORDER — SENNOSIDES-DOCUSATE SODIUM 8.6-50 MG PO TABS
1.0000 | ORAL_TABLET | Freq: Two times a day (BID) | ORAL | Status: DC
  Administered 2017-07-11 – 2017-07-13 (×5): 1 via ORAL
  Filled 2017-07-11 (×5): qty 1

## 2017-07-11 MED ORDER — HYDROMORPHONE HCL 2 MG/ML IJ SOLN: 2.0000 mg | INTRAMUSCULAR | Status: AC

## 2017-07-11 MED ORDER — LORATADINE 10 MG PO TABS
10.0000 mg | ORAL_TABLET | Freq: Every day | ORAL | Status: DC | PRN
  Administered 2017-07-12: 10 mg via ORAL
  Filled 2017-07-11: qty 1

## 2017-07-11 MED ORDER — DIPHENHYDRAMINE HCL 25 MG PO CAPS: 25.0000 mg | ORAL_CAPSULE | ORAL | Status: DC | PRN

## 2017-07-11 MED ORDER — NALOXONE HCL 0.4 MG/ML IJ SOLN: 0.4000 mg | INTRAMUSCULAR | Status: DC | PRN

## 2017-07-11 MED ORDER — ONDANSETRON HCL 4 MG PO TABS: 4.0000 mg | ORAL_TABLET | ORAL | Status: DC | PRN

## 2017-07-11 MED ORDER — SODIUM CHLORIDE 0.9% FLUSH: 9.0000 mL | INTRAVENOUS | Status: DC | PRN

## 2017-07-11 MED ORDER — SODIUM CHLORIDE 0.9 % IV SOLN
1.0000 g | Freq: Once | INTRAVENOUS | Status: AC
  Administered 2017-07-11: 1 g via INTRAVENOUS
  Filled 2017-07-11: qty 10

## 2017-07-11 MED ORDER — IOPAMIDOL (ISOVUE-300) INJECTION 61%
100.0000 mL | Freq: Once | INTRAVENOUS | Status: AC | PRN
  Administered 2017-07-11: 100 mL via INTRAVENOUS

## 2017-07-11 MED ORDER — PROMETHAZINE HCL 25 MG PO TABS: 25.0000 mg | ORAL_TABLET | ORAL | Status: DC | PRN

## 2017-07-11 MED ORDER — POLYETHYLENE GLYCOL 3350 17 G PO PACK: 17.0000 g | PACK | Freq: Every day | ORAL | Status: DC | PRN

## 2017-07-11 MED ORDER — ALBUTEROL SULFATE (2.5 MG/3ML) 0.083% IN NEBU: 3.0000 mL | INHALATION_SOLUTION | Freq: Four times a day (QID) | RESPIRATORY_TRACT | Status: DC | PRN

## 2017-07-11 MED ORDER — KETOROLAC TROMETHAMINE 15 MG/ML IJ SOLN
15.0000 mg | INTRAMUSCULAR | Status: AC
  Administered 2017-07-11: 15 mg via INTRAVENOUS
  Filled 2017-07-11: qty 1

## 2017-07-11 MED ORDER — SODIUM CHLORIDE 0.45 % IV SOLN
INTRAVENOUS | Status: DC
  Administered 2017-07-11: 01:00:00 via INTRAVENOUS

## 2017-07-11 MED ORDER — PROMETHAZINE HCL 25 MG PO TABS
25.0000 mg | ORAL_TABLET | ORAL | Status: DC | PRN
  Administered 2017-07-11: 25 mg via ORAL
  Filled 2017-07-11: qty 1

## 2017-07-11 MED ORDER — AMLODIPINE BESYLATE 10 MG PO TABS
10.0000 mg | ORAL_TABLET | Freq: Every day | ORAL | Status: DC
  Administered 2017-07-11 – 2017-07-13 (×3): 10 mg via ORAL
  Filled 2017-07-11 (×3): qty 1

## 2017-07-11 MED ORDER — SODIUM CHLORIDE 0.9 % IV SOLN
500.0000 mg | Freq: Once | INTRAVENOUS | Status: AC
  Administered 2017-07-11: 500 mg via INTRAVENOUS
  Filled 2017-07-11: qty 500

## 2017-07-11 MED ORDER — HYDROMORPHONE HCL 2 MG/ML IJ SOLN
2.0000 mg | INTRAMUSCULAR | Status: AC
  Administered 2017-07-11: 2 mg via INTRAVENOUS
  Filled 2017-07-11: qty 1

## 2017-07-11 MED ORDER — ONDANSETRON HCL 4 MG/2ML IJ SOLN
4.0000 mg | Freq: Four times a day (QID) | INTRAMUSCULAR | Status: DC | PRN
  Administered 2017-07-12 (×2): 4 mg via INTRAVENOUS
  Filled 2017-07-11 (×2): qty 2

## 2017-07-11 MED ORDER — HYDROMORPHONE 1 MG/ML IV SOLN
INTRAVENOUS | Status: DC
  Administered 2017-07-11: 11:00:00 via INTRAVENOUS
  Administered 2017-07-11: 1.6 mg via INTRAVENOUS
  Administered 2017-07-11: 3.2 mg via INTRAVENOUS
  Administered 2017-07-12: 1.6 mg via INTRAVENOUS
  Administered 2017-07-12 (×3): 0.8 mg via INTRAVENOUS
  Administered 2017-07-12: 2.4 mg via INTRAVENOUS
  Administered 2017-07-12 – 2017-07-13 (×2): 1.6 mg via INTRAVENOUS
  Administered 2017-07-13: 0 mg via INTRAVENOUS
  Administered 2017-07-13: 0.8 mg via INTRAVENOUS
  Filled 2017-07-11: qty 25

## 2017-07-11 MED ORDER — GI COCKTAIL ~~LOC~~
30.0000 mL | Freq: Once | ORAL | Status: AC
  Administered 2017-07-11: 30 mL via ORAL
  Filled 2017-07-11: qty 30

## 2017-07-11 MED ORDER — SODIUM CHLORIDE 0.9 % IV SOLN: 500.0000 mg | Freq: Once | INTRAVENOUS | Status: DC

## 2017-07-11 NOTE — ED Notes (Signed)
Pt denies SOB or CP. States that this pain is normal for a crisis. He has been dealing with this flare for 2-3 days.

## 2017-07-11 NOTE — ED Notes (Signed)
ED TO INPATIENT HANDOFF REPORT  Name/Age/Gender Dean Webb 42 y.o. male  Code Status Code Status History    Date Active Date Inactive Code Status Order ID Comments User Context   04/30/2017 0020 05/04/2017 1544 Full Code [id number]  Rise Patience, MD Inpatient   04/22/2017 1518 04/26/2017 1454 Full Code [id number]  Leana Gamer, MD Inpatient   03/08/2016 1801 03/11/2016 1734 Full Code [id number]  Leana Gamer, MD Inpatient      Home/SNF/Other Home  Chief Complaint sickle cell crisis  Level of Care/Admitting Diagnosis ED Disposition    ED Disposition Condition [facility] Area: [facility] [[id number]]  Level of Care: Med-Surg [16]  Diagnosis: Sickle cell pain crisis [facility]) [[id number]]  Admitting Physician: Mariel Aloe [phone]  Attending Physician: Mariel Aloe [phone]  Estimated length of stay: past midnight tomorrow  Certification:: I certify this patient will need inpatient services for at least 2 midnights  PT Class (Do Not Modify): Inpatient [101]  PT Acc Code (Do Not Modify): Private [1]       Medical History Past Medical History:  Diagnosis Date  . Asthma   . GERD (gastroesophageal reflux disease)   . 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 HTN (hypertension) 2008  . Hypertension   . Sickle cell anemia (HCC)   . Sickle cell disease ([place])     Allergies No Known Allergies  IV Location/Drains/Wounds Patient Lines/Drains/Airways Status   Active Line/Drains/Airways    Name:   Placement date:   Placement time:   Site:   Days:   Peripheral IV 07/11/17 Right Forearm   07/11/17    0102    Forearm   less than 1          Labs/Imaging Results for orders placed or performed during the hospital encounter of 07/10/17 (from the past 48 hour(s))  Comprehensive metabolic panel     Status: Abnormal   Collection Time: 07/10/17  7:58 PM  Result Value Ref Range   Sodium 136 135 - 145 mmol/L   Potassium 3.9 3.5 - 5.1  mmol/L   Chloride 104 101 - 111 mmol/L   CO2 25 22 - 32 mmol/L   Glucose, Bld 94 65 - 99 mg/dL   BUN 11 6 - 20 mg/dL   Creatinine, Ser 1.07 0.61 - 1.24 mg/dL   Calcium 10.3 8.9 - 10.3 mg/dL   Total Protein 7.4 6.5 - 8.1 g/dL   Albumin 4.4 3.5 - 5.0 g/dL   AST 21 15 - 41 U/L   ALT 15 (L) 17 - 63 U/L   Alkaline Phosphatase 80 38 - 126 U/L   Total Bilirubin 1.4 (H) 0.3 - 1.2 mg/dL   GFR calc non Af Amer >60 >60 mL/min   GFR calc Af Amer >60 >60 mL/min    Comment: (NOTE) The eGFR has been calculated using the CKD EPI equation. This calculation has not been validated in all clinical situations. eGFR's persistently <60 mL/min signify possible Chronic Kidney Disease.    Anion gap 7 5 - 15    Comment: Performed at [facility], [place] [address], [place], [place] [address]  CBC with Differential     Status: Abnormal   Collection Time: 07/10/17  7:58 PM  Result Value Ref Range   WBC 11.8 (H) 4.0 - 10.5 K/uL   RBC 4.52 4.22 - 5.81 MIL/uL   Hemoglobin 13.2 13.0 - 17.0 g/dL   HCT 35.8 (L) 39.0 -  52.0 %   MCV 79.2 78.0 - 100.0 fL   MCH 29.2 26.0 - 34.0 pg   MCHC 36.9 (H) 30.0 - 36.0 g/dL   RDW 17.2 (H) 11.5 - 15.5 %   Platelets 274 150 - 400 K/uL   Neutrophils Relative % 53 %   Neutro Abs 6.3 1.7 - 7.7 K/uL   Lymphocytes Relative 32 %   Lymphs Abs 3.7 0.7 - 4.0 K/uL   Monocytes Relative 11 %   Monocytes Absolute 1.3 (H) 0.1 - 1.0 K/uL   Eosinophils Relative 3 %   Eosinophils Absolute 0.4 0.0 - 0.7 K/uL   Basophils Relative 0 %   Basophils Absolute 0.0 0.0 - 0.1 K/uL    Comment: Performed at [facility], [place] [address], [place], [place] [address]  Reticulocytes     Status: Abnormal   Collection Time: 07/10/17  7:58 PM  Result Value Ref Range   Retic Ct Pct 4.0 (H) 0.4 - 3.1 %   RBC. 4.52 4.22 - 5.81 MIL/uL   Retic Count, Absolute 180.8 19.0 - 186.0 K/uL    Comment: Performed at [facility], [place] [address] [place],  [place] [address]  Lipase, blood     Status: None   Collection Time: 07/11/17 12:16 AM  Result Value Ref Range   Lipase 27 11 - 51 U/L    Comment: Performed at [facility], [place] [address], [place], [place] [address]   Ct Abdomen Pelvis W Contrast  Result Date: 07/11/2017 CLINICAL DATA:  Acute onset of right-sided abdominal and right hip pain. Leukocytosis. Current history of sickle cell anemia. EXAM: CT ABDOMEN AND PELVIS WITH CONTRAST TECHNIQUE: Multidetector CT imaging of the abdomen and pelvis was performed using the standard protocol following bolus administration of intravenous contrast. CONTRAST:  14m ISOVUE-300 IOPAMIDOL (ISOVUE-300) INJECTION 61% COMPARISON:  CT of the abdomen and pelvis from 11/07/2008 FINDINGS: Lower chest: Mild bibasilar opacities may reflect atelectasis or possibly mild infection. The visualized portions of the mediastinum are unremarkable. Wall thickening at the distal esophagus may reflect esophagitis or chronic inflammation. Hepatobiliary: A tiny nonspecific 7 mm hypodensity at the hepatic dome is stable from 2010 and likely benign. The liver is otherwise unremarkable. The patient is status post cholecystectomy, with clips noted at the gallbladder fossa. The common bile duct is normal in caliber. Pancreas: The pancreas is within normal limits. Spleen: There is mild atrophy of the spleen, new from the prior study. Adrenals/Urinary Tract: The adrenal glands are unremarkable in appearance. The patient has a right-sided pelvic kidney, which is unremarkable in appearance. The left kidney is also unremarkable. There is no evidence of hydronephrosis. No renal or ureteral stones are identified. No perinephric stranding is seen. Stomach/Bowel: The stomach is unremarkable in appearance. The small bowel is within normal limits. The appendix is not visualized; there is no evidence for appendicitis. The colon is unremarkable in appearance. Vascular/Lymphatic: The abdominal  aorta is unremarkable in appearance. The inferior vena cava is grossly unremarkable. No retroperitoneal lymphadenopathy is seen. No pelvic sidewall lymphadenopathy is identified. Reproductive: The bladder is mildly distended and grossly unremarkable. The prostate remains normal in size. Other: No additional soft tissue abnormalities are seen. Musculoskeletal: No acute osseous abnormalities are identified. The right hip is grossly unremarkable in appearance, and symmetric with the left hip. The visualized musculature is unremarkable in appearance. IMPRESSION: 1. Mild bibasilar airspace opacities may reflect atelectasis or possibly mild pneumonia. 2. Wall thickening at the distal esophagus may  reflect esophagitis or chronic inflammation. 3. Mild splenic atrophy. 4. Right-sided pelvic kidney is unremarkable in appearance. Electronically Signed   By: Garald Balding M.D.   On: 07/11/2017 06:48    Pending Labs Unresulted Labs (From admission, onward)   None      Vitals/Pain Today's Vitals   07/11/17 0715 07/11/17 0730 07/11/17 0745 07/11/17 0800  BP:  138/79  134/76  Pulse: 89 65 70 66  Resp: '10 13 13 11  '$ Temp:      TempSrc:      SpO2: 95% 94% 93% 95%  Weight:      Height:      PainSc:        Isolation Precautions No active isolations  Medications Medications  0.45 % sodium chloride infusion ( Intravenous New Bag/Given 07/11/17 0102)  diphenhydrAMINE (BENADRYL) capsule 25-50 mg (25 mg Oral Given 07/11/17 0103)  promethazine (PHENERGAN) tablet 25 mg (25 mg Oral Given 07/11/17 0103)  iopamidol (ISOVUE-300) 61 % injection (has no administration in time range)  azithromycin (ZITHROMAX) 500 mg in sodium chloride 0.9 % 250 mL IVPB (has no administration in time range)  cefTRIAXone (ROCEPHIN) 1 g in sodium chloride 0.9 % 100 mL IVPB (1 g Intravenous New Bag/Given 07/11/17 0820)  ketorolac (TORADOL) 15 MG/ML injection 15 mg (15 mg Intravenous Given 07/11/17 0103)  HYDROmorphone (DILAUDID) injection 2  mg (2 mg Intravenous Given 07/11/17 0103)    Or  HYDROmorphone (DILAUDID) injection 2 mg ( Subcutaneous See Alternative 07/11/17 0103)  HYDROmorphone (DILAUDID) injection 2 mg (2 mg Intravenous Given 07/11/17 0211)    Or  HYDROmorphone (DILAUDID) injection 2 mg ( Subcutaneous See Alternative 07/11/17 0211)  HYDROmorphone (DILAUDID) injection 2 mg (2 mg Intravenous Given 07/11/17 0247)    Or  HYDROmorphone (DILAUDID) injection 2 mg ( Subcutaneous See Alternative 07/11/17 0247)  iopamidol (ISOVUE-300) 61 % injection 100 mL (100 mLs Intravenous Contrast Given 07/11/17 0627)  gi cocktail (Maalox,Lidocaine,Donnatal) (30 mLs Oral Given 07/11/17 0819)    Mobility walks

## 2017-07-11 NOTE — ED Notes (Signed)
Weight in kg- 80.6kg

## 2017-07-11 NOTE — ED Provider Notes (Signed)
[facility]-EMERGENCY DEPT Provider Note   CSN: [id number] Arrival date & time: 07/10/17  1833     History   Chief Complaint Chief Complaint  Patient presents with  . sickle cell pain    HPI Dean Webb is a 42 y.o. male.  Patient presents to the emergency department with a chief complaint of sickle cell pain.  He states that his symptoms have gradually worsened throughout the day.  States that he is out of his regular medications, and that he no longer has primary care doctor because his PCP lost his license.  He complains of right hip pain that radiates down his right leg.  He also complains of generalized abdominal pain, but denies any fever, chills, nausea, vomiting, diarrhea.  He denies any red or swollen joints.  He denies any chest pain, shortness of breath, cough.  Denies any other associated symptoms.  The history is provided by the patient. No language interpreter was used.    Past Medical History:  Diagnosis Date  . Asthma   . GERD (gastroesophageal reflux disease)   . Hb-S/Hb-C disease (HCC) 1979  . HTN (hypertension) 2008  . Hypertension   . Sickle cell anemia (HCC)   . Sickle cell disease [facility])     Patient Active Problem List   Diagnosis Date Noted  . Pneumobilia   . Sickle cell pain crisis (HCC) 04/29/2017  . Acute chest syndrome (HCC) 04/24/2017  . CAP (community acquired pneumonia) 04/24/2017  . OSA (obstructive sleep apnea) 04/22/2017  . Mild asthma without complication 04/22/2017  . Essential hypertension 03/11/2016  . Chronic prescription opiate use 03/11/2016  . Precordial chest pain 03/08/2016  . Sickle cell disease (HCC)     Past Surgical History:  Procedure Laterality Date  . CHOLECYSTECTOMY          Home Medications    Prior to Admission medications   Medication Sig Start Date End Date Taking? Authorizing Provider  albuterol (PROVENTIL HFA;VENTOLIN HFA) 108 (90 Base) MCG/ACT inhaler Inhale 2 puffs into the  lungs every 6 (six) hours as needed for wheezing or shortness of breath. 04/23/17  Yes Altha Harm, MD  amLODipine (NORVASC) 10 MG tablet Take 1 tablet (10 mg total) by mouth daily. 04/23/17  Yes Altha Harm, MD  cetirizine (ZYRTEC) 10 MG tablet Take 1 tablet (10 mg total) by mouth daily as needed for allergies. 04/23/17  Yes Altha Harm, MD  ibuprofen (ADVIL,MOTRIN) 800 MG tablet Take 1 tablet (800 mg total) by mouth every 8 (eight) hours as needed. Patient taking differently: Take 800 mg by mouth every 8 (eight) hours as needed for headache, mild pain or moderate pain.  04/23/17  Yes Altha Harm, MD  morphine (MS CONTIN) 60 MG 12 hr tablet Take 1 tablet (60 mg total) by mouth every 12 (twelve) hours. 05/04/17  Yes Quentin Angst, MD  oxycodone (ROXICODONE) 30 MG immediate release tablet Take 1 tablet (30 mg total) by mouth every 4 (four) hours as needed for pain. 05/04/17  Yes Quentin Angst, MD    Family History Family History  Problem Relation Age of Onset  . Sickle cell trait Mother   . Hypertension Mother   . Asthma Mother   . Sickle cell trait Father   . Congestive Heart Failure Father   . Hypertension Sister   . Sickle cell anemia Daughter   . Hypertension Daughter   . Diabetes Daughter     Social History Social  History   Tobacco Use  . Smoking status: Current Some Day Smoker    Packs/day: 1.50    Years: 17.00    Pack years: 25.50  . Smokeless tobacco: Never Used  Substance Use Topics  . Alcohol use: Yes    Comment: Occasional drinker  . Drug use: Yes    Types: Marijuana     Allergies   Patient has no known allergies.   Review of Systems Review of Systems  All other systems reviewed and are negative.    Physical Exam Updated Vital Signs BP (!) 151/111 (BP Location: Right Arm)   Pulse 81   Temp 98.4 F (36.9 C) (Oral)   Resp 16   Ht  (1.702 m)   Wt 85.7 kg (189 lb)   SpO2 98%   BMI 29.60 kg/m    Physical Exam  Constitutional: He is oriented to person, place, and time. He appears well-developed and well-nourished.  HENT:  Head: Normocephalic and atraumatic.  Eyes: Pupils are equal, round, and reactive to light. Conjunctivae and EOM are normal. Right eye exhibits no discharge. Left eye exhibits no discharge. No scleral icterus.  Neck: Normal range of motion. Neck supple. No JVD present.  Cardiovascular: Normal rate, regular rhythm and normal heart sounds. Exam reveals no gallop and no friction rub.  No murmur heard. Pulmonary/Chest: Effort normal and breath sounds normal. No respiratory distress. He has no wheezes. He has no rales. He exhibits no tenderness.  CTAB  Abdominal: Soft. He exhibits no distension and no mass. There is no tenderness. There is no rebound and no guarding.  Musculoskeletal: Normal range of motion. He exhibits no edema or tenderness.  Neurological: He is alert and oriented to person, place, and time.  Skin: Skin is warm and dry.  No erythema, no rash, no swollen joints  Psychiatric: He has a normal mood and affect. His behavior is normal. Judgment and thought content normal.  Nursing note and vitals reviewed.    ED Treatments / Results  Labs (all labs ordered are listed, but only abnormal results are displayed) Labs Reviewed  COMPREHENSIVE METABOLIC PANEL - Abnormal; Notable for the following components:      Result Value   ALT 15 (*)    Total Bilirubin 1.4 (*)    All other components within normal limits  CBC WITH DIFFERENTIAL/PLATELET - Abnormal; Notable for the following components:   WBC 11.8 (*)    HCT 35.8 (*)    MCHC 36.9 (*)    RDW 17.2 (*)    Monocytes Absolute 1.3 (*)    All other components within normal limits  RETICULOCYTES - Abnormal; Notable for the following components:   Retic Ct Pct 4.0 (*)    All other components within normal limits  LIPASE, BLOOD    EKG None  Radiology No results found.  Procedures Procedures  (including critical care time) CRITICAL CARE Performed by: Roxy Horseman Multiple rechecks, opacities on CT, treated for acute chest syndrome with antibiotics.  Total critical care time: 37 minutes  Critical care time was exclusive of separately billable procedures and treating other patients.  Critical care was necessary to treat or prevent imminent or life-threatening deterioration.  Critical care was time spent personally by me on the following activities: development of treatment plan with patient and/or surrogate as well as nursing, discussions with consultants, evaluation of patient's response to treatment, examination of patient, obtaining history from patient or surrogate, ordering and performing treatments and interventions, ordering and review of  laboratory studies, ordering and review of radiographic studies, pulse oximetry and re-evaluation of patient's condition.  Medications Ordered in ED Medications  0.45 % sodium chloride infusion (has no administration in time range)  ketorolac (TORADOL) 15 MG/ML injection 15 mg (has no administration in time range)  HYDROmorphone (DILAUDID) injection 2 mg (has no administration in time range)    Or  HYDROmorphone (DILAUDID) injection 2 mg (has no administration in time range)  HYDROmorphone (DILAUDID) injection 2 mg (has no administration in time range)    Or  HYDROmorphone (DILAUDID) injection 2 mg (has no administration in time range)  HYDROmorphone (DILAUDID) injection 2 mg (has no administration in time range)    Or  HYDROmorphone (DILAUDID) injection 2 mg (has no administration in time range)  HYDROmorphone (DILAUDID) injection 2 mg (has no administration in time range)    Or  HYDROmorphone (DILAUDID) injection 2 mg (has no administration in time range)  diphenhydrAMINE (BENADRYL) capsule 25-50 mg (has no administration in time range)  promethazine (PHENERGAN) tablet 25 mg (has no administration in time range)     Initial  Impression / Assessment and Plan / ED Course  I have reviewed the triage vital signs and the nursing notes.  Pertinent labs & imaging results that were available during my care of the patient were reviewed by me and considered in my medical decision making (see chart for details).     Patient with sickle cell pain crisis in his right lower extremity.  He is out of his regular pain medications.  No history of chest pain, shortness of breath, cough.  He is afebrile.    Patient continues to complain of upper abdominal pain.  Will check CT.  CT shows bilateral basilar opacities.  He does have leukocytosis.  Given his sickle cell pain and upper abdominal pain, it is possible that this could be referred and could be early acute chest.  Will give antibiotics.  He may also have esophagitis as seen on CT.  Patient will need admission for pain control.  Appreciate Hospitalist for admitting the patient.  Final Clinical Impressions(s) / ED Diagnoses   Final diagnoses:  Sickle cell crisis [facility])    ED Discharge Orders    None       Roxy Horseman, PA-C 07/11/17 0725    Molpus, Jonny Ruiz, MD 07/11/17 2245

## 2017-07-11 NOTE — H&P (Signed)
Dean Webb is an 42 y.o. male.    Chief Complaint: Pain all over  HPI: A 42 year-old gentleman with known history of sickle cell disease who is out of his home medications secondary to losing his physician. Patient was suspected to have bilateral infiltrates due to leukocytosis.  No evidence of pneumonia.  He has been empirically started on antibiotics.  CT scan showed bilateral findings.  It could be early acute chest syndrome.  He's had previous pneumonias. He has some mild shortness of breath.  Pain is 9/10 in his back and legs.  Patient reported that this feels like his typical sickle cell crisis.  He has received 3 doses of pain medications in the ER and pain is persistent.  He'll be admitted to the hospital therefore with acute sickle cell crisis.  Past Medical History:  Diagnosis Date  . Asthma   . GERD (gastroesophageal reflux disease)   . 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 HTN (hypertension) 2008  . Hypertension   . Sickle cell anemia ([facility])   . Sickle cell disease ([place])     Past Surgical History:  Procedure Laterality Date  . CHOLECYSTECTOMY      Family History  Problem Relation Age of Onset  . Sickle cell trait Mother   . Hypertension Mother   . Asthma Mother   . Sickle cell trait Father   . Congestive Heart Failure Father   . Hypertension Sister   . Sickle cell anemia Daughter   . Hypertension Daughter   . Diabetes Daughter    Social History:  reports that he has been smoking.  He has a 25.50 pack-year smoking history. He has never used smokeless tobacco. He reports that he drinks alcohol. He reports that he has current or past drug history. Drug: Marijuana.  Allergies: No Known Allergies  Medications Prior to Admission  Medication Sig Dispense Refill  . albuterol (PROVENTIL HFA;VENTOLIN HFA) 108 (90 Base) MCG/ACT inhaler Inhale 2 puffs into the lungs every 6 (six) hours as needed for wheezing or shortness of breath. 1 Inhaler 1  . amLODipine (NORVASC) 10 MG  tablet Take 1 tablet (10 mg total) by mouth daily. 30 tablet 1  . cetirizine (ZYRTEC) 10 MG tablet Take 1 tablet (10 mg total) by mouth daily as needed for allergies. 30 tablet 1  . ibuprofen (ADVIL,MOTRIN) 800 MG tablet Take 1 tablet (800 mg total) by mouth every 8 (eight) hours as needed. (Patient taking differently: Take 800 mg by mouth every 8 (eight) hours as needed for headache, mild pain or moderate pain. ) 30 tablet 0  . morphine (MS CONTIN) 60 MG 12 hr tablet Take 1 tablet (60 mg total) by mouth every 12 (twelve) hours. 10 tablet 0  . oxycodone (ROXICODONE) 30 MG immediate release tablet Take 1 tablet (30 mg total) by mouth every 4 (four) hours as needed for pain. 30 tablet 0    Results for orders placed or performed during the hospital encounter of 07/10/17 (from the past 48 hour(s))  Comprehensive metabolic panel     Status: Abnormal   Collection Time: 07/10/17  7:58 PM  Result Value Ref Range   Sodium 136 135 - 145 mmol/L   Potassium 3.9 3.5 - 5.1 mmol/L   Chloride 104 101 - 111 mmol/L   CO2 25 22 - 32 mmol/L   Glucose, Bld 94 65 - 99 mg/dL   BUN 11 6 - 20 mg/dL   Creatinine, Ser 1.07 0.61 - 1.24 mg/dL  Calcium 10.3 8.9 - 10.3 mg/dL   Total Protein 7.4 6.5 - 8.1 g/dL   Albumin 4.4 3.5 - 5.0 g/dL   AST 21 15 - 41 U/L   ALT 15 (L) 17 - 63 U/L   Alkaline Phosphatase 80 38 - 126 U/L   Total Bilirubin 1.4 (H) 0.3 - 1.2 mg/dL   GFR calc non Af Amer >60 >60 mL/min   GFR calc Af Amer >60 >60 mL/min    Comment: (NOTE) The eGFR has been calculated using the CKD EPI equation. This calculation has not been validated in all clinical situations. eGFR's persistently <60 mL/min signify possible Chronic Kidney Disease.    Anion gap 7 5 - 15    Comment: Performed at [facility], [place] [address], [place], [place] [address]  CBC with Differential     Status: Abnormal   Collection Time: 07/10/17  7:58 PM  Result Value Ref Range   WBC 11.8 (H) 4.0 - 10.5 K/uL   RBC  4.52 4.22 - 5.81 MIL/uL   Hemoglobin 13.2 13.0 - 17.0 g/dL   HCT 35.8 (L) 39.0 - 52.0 %   MCV 79.2 78.0 - 100.0 fL   MCH 29.2 26.0 - 34.0 pg   MCHC 36.9 (H) 30.0 - 36.0 g/dL   RDW 17.2 (H) 11.5 - 15.5 %   Platelets 274 150 - 400 K/uL   Neutrophils Relative % 53 %   Neutro Abs 6.3 1.7 - 7.7 K/uL   Lymphocytes Relative 32 %   Lymphs Abs 3.7 0.7 - 4.0 K/uL   Monocytes Relative 11 %   Monocytes Absolute 1.3 (H) 0.1 - 1.0 K/uL   Eosinophils Relative 3 %   Eosinophils Absolute 0.4 0.0 - 0.7 K/uL   Basophils Relative 0 %   Basophils Absolute 0.0 0.0 - 0.1 K/uL    Comment: Performed at [facility], [place] [address], [place], [place] [address]  Reticulocytes     Status: Abnormal   Collection Time: 07/10/17  7:58 PM  Result Value Ref Range   Retic Ct Pct 4.0 (H) 0.4 - 3.1 %   RBC. 4.52 4.22 - 5.81 MIL/uL   Retic Count, Absolute 180.8 19.0 - 186.0 K/uL    Comment: Performed at [facility], [place] [address], [place], [place] [address]  Lipase, blood     Status: None   Collection Time: 07/11/17 12:16 AM  Result Value Ref Range   Lipase 27 11 - 51 U/L    Comment: Performed at [facility], [place] [address], [place], [place] [address]   Ct Abdomen Pelvis W Contrast  Result Date: 07/11/2017 CLINICAL DATA:  Acute onset of right-sided abdominal and right hip pain. Leukocytosis. Current history of sickle cell anemia. EXAM: CT ABDOMEN AND PELVIS WITH CONTRAST TECHNIQUE: Multidetector CT imaging of the abdomen and pelvis was performed using the standard protocol following bolus administration of intravenous contrast. CONTRAST:  159m ISOVUE-300 IOPAMIDOL (ISOVUE-300) INJECTION 61% COMPARISON:  CT of the abdomen and pelvis from 11/07/2008 FINDINGS: Lower chest: Mild bibasilar opacities may reflect atelectasis or possibly mild infection. The visualized portions of the mediastinum are unremarkable. Wall thickening at the distal esophagus may reflect  esophagitis or chronic inflammation. Hepatobiliary: A tiny nonspecific 7 mm hypodensity at the hepatic dome is stable from 2010 and likely benign. The liver is otherwise unremarkable. The patient is status post cholecystectomy, with clips noted at the gallbladder fossa. The common bile duct is normal in caliber. Pancreas:  The pancreas is within normal limits. Spleen: There is mild atrophy of the spleen, new from the prior study. Adrenals/Urinary Tract: The adrenal glands are unremarkable in appearance. The patient has a right-sided pelvic kidney, which is unremarkable in appearance. The left kidney is also unremarkable. There is no evidence of hydronephrosis. No renal or ureteral stones are identified. No perinephric stranding is seen. Stomach/Bowel: The stomach is unremarkable in appearance. The small bowel is within normal limits. The appendix is not visualized; there is no evidence for appendicitis. The colon is unremarkable in appearance. Vascular/Lymphatic: The abdominal aorta is unremarkable in appearance. The inferior vena cava is grossly unremarkable. No retroperitoneal lymphadenopathy is seen. No pelvic sidewall lymphadenopathy is identified. Reproductive: The bladder is mildly distended and grossly unremarkable. The prostate remains normal in size. Other: No additional soft tissue abnormalities are seen. Musculoskeletal: No acute osseous abnormalities are identified. The right hip is grossly unremarkable in appearance, and symmetric with the left hip. The visualized musculature is unremarkable in appearance. IMPRESSION: 1. Mild bibasilar airspace opacities may reflect atelectasis or possibly mild pneumonia. 2. Wall thickening at the distal esophagus may reflect esophagitis or chronic inflammation. 3. Mild splenic atrophy. 4. Right-sided pelvic kidney is unremarkable in appearance. Electronically Signed   By: Garald Balding M.D.   On: 07/11/2017 06:48    Review of Systems  Constitutional: Negative.    HENT: Negative.   Eyes: Negative.   Respiratory: Positive for cough and shortness of breath.   Cardiovascular: Negative.   Gastrointestinal: Negative.   Genitourinary: Negative.   Skin: Negative.     Blood pressure 124/83, pulse 62, temperature 98.2 F (36.8 C), temperature source Oral, resp. rate 18, height _0  (1.702 m), weight 81.4 kg (179 lb 7.3 oz), SpO2 100 %. Physical Exam  Constitutional: He is oriented to person, place, and time. He appears well-developed and well-nourished.  HENT:  Head: Normocephalic and atraumatic.  Eyes: Pupils are equal, round, and reactive to light. Conjunctivae are normal.  Neck: Normal range of motion. Neck supple.  Cardiovascular: Normal rate, regular rhythm and normal heart sounds.  Respiratory: Effort normal and breath sounds normal.  GI: Soft. Bowel sounds are normal.  Musculoskeletal: Normal range of motion.  Neurological: He is alert and oriented to person, place, and time.  Skin: Skin is warm and dry.  Psychiatric: He has a normal mood and affect.     Assessment/Plan A 42 year old gentleman admitted with sickle cell painful crisis.  #1 sickle cell painful crisis: Patient will be maintained on Dilaudid PCA with Toradol and IV fluids.  He appears to be improving pain wise.  His oral home medications may be restarted at some point.  A discharge however patient has no home medications.  #2 sickle cell anemia: H&H appears stable.  Continue current regimen.  #3 essential hypertension: continue blood pressure medications.  #4 history of asthma: No exacerbation.  Continue monitoring  #5 obstructive sleep apnea: Patient is not using CPAP at the moment.  We'll encourage him to use CPAP as needed.  Barbette Merino, MD 07/11/2017, 9:55 AM

## 2017-07-12 LAB — CBC WITH DIFFERENTIAL/PLATELET
BASOS ABS: 0 10*3/uL (ref 0.0–0.1)
BASOS PCT: 0 %
EOS ABS: 0.5 10*3/uL (ref 0.0–0.7)
Eosinophils Relative: 5 %
HCT: 34.8 % — ABNORMAL LOW (ref 39.0–52.0)
Hemoglobin: 12.8 g/dL — ABNORMAL LOW (ref 13.0–17.0)
Lymphocytes Relative: 31 %
Lymphs Abs: 3.2 10*3/uL (ref 0.7–4.0)
MCH: 28.8 pg (ref 26.0–34.0)
MCHC: 36.8 g/dL — ABNORMAL HIGH (ref 30.0–36.0)
MCV: 78.4 fL (ref 78.0–100.0)
MONO ABS: 1.4 10*3/uL — AB (ref 0.1–1.0)
Monocytes Relative: 14 %
Neutro Abs: 5.3 10*3/uL (ref 1.7–7.7)
Neutrophils Relative %: 50 %
PLATELETS: 223 10*3/uL (ref 150–400)
RBC: 4.44 MIL/uL (ref 4.22–5.81)
RDW: 17.3 % — AB (ref 11.5–15.5)
WBC: 10.5 10*3/uL (ref 4.0–10.5)

## 2017-07-12 LAB — COMPREHENSIVE METABOLIC PANEL
ALBUMIN: 4 g/dL (ref 3.5–5.0)
ALK PHOS: 74 U/L (ref 38–126)
ALT: 24 U/L (ref 17–63)
ANION GAP: 6 (ref 5–15)
AST: 29 U/L (ref 15–41)
BILIRUBIN TOTAL: 1.7 mg/dL — AB (ref 0.3–1.2)
BUN: 10 mg/dL (ref 6–20)
CALCIUM: 9.8 mg/dL (ref 8.9–10.3)
CO2: 26 mmol/L (ref 22–32)
CREATININE: 1.11 mg/dL (ref 0.61–1.24)
Chloride: 105 mmol/L (ref 101–111)
GFR calc Af Amer: >60 mL/min (ref >60)
GFR calc non Af Amer: >60 mL/min (ref >60)
GLUCOSE: 116 mg/dL — AB (ref 65–99)
Potassium: 4.2 mmol/L (ref 3.5–5.1)
Sodium: 137 mmol/L (ref 135–145)
TOTAL PROTEIN: 6.5 g/dL (ref 6.5–8.1)

## 2017-07-12 MED ORDER — LIP MEDEX EX OINT
TOPICAL_OINTMENT | CUTANEOUS | Status: AC
Start: 2017-07-12 — End: 2017-07-12
  Administered 2017-07-12: 13:00:00
  Filled 2017-07-12: qty 7

## 2017-07-12 NOTE — Progress Notes (Signed)
Subjective: A 42 year old gentleman admitted with sickle cell painful crisis. Patient ran out of his medications at home.  No fever or chills.  No nausea vomiting or diarrhea.  Pain is still at 8/10.  He is not using his Dilaudid PCA adequately. He only uses 9 mg since admission. He denies shortness of breath or cough  Objective: Vital signs in last 24 hours: Temp:  [98 F (36.7 C)-98.2 F (36.8 C)] 98.2 F (36.8 C) (05/30 0543) Pulse Rate:  [51-91] 55 (05/30 0543) Resp:  [11-19] 13 (05/30 0801) BP: (124-138)/(76-102) 126/90 (05/30 0543) SpO2:  [95 %-100 %] 99 % (05/30 0801) Weight:  [81.4 kg (179 lb 7.3 oz)-82.6 kg (182 lb 1.6 oz)] 82.6 kg (182 lb 1.6 oz) (05/30 0543) Weight change: -4.33 kg (-9 lb 8.7 oz) Last BM Date: 07/09/17  Intake/Output from previous day: 05/29 0701 - 05/30 0700 In: 2481.2 [P.O.:240; I.V.:2241.2] Out: 1950 [Urine:1950] Intake/Output this shift: No intake/output data recorded.  General appearance: alert, cooperative, appears stated age and no distress Neck: no adenopathy, no carotid bruit, no JVD, supple, symmetrical, trachea midline and thyroid not enlarged, symmetric, no tenderness/mass/nodules Resp: clear to auscultation bilaterally Cardio: regular rate and rhythm, S1, S2 normal, no murmur, click, rub or gallop GI: soft, non-tender; bowel sounds normal; no masses,  no organomegaly Extremities: extremities normal, atraumatic, no cyanosis or edema Pulses: 2+ and symmetric Neurologic: Grossly normal  Lab Results: Recent Labs    07/10/17 1958 07/12/17 0401  WBC 11.8* 10.5  HGB 13.2 12.8*  HCT 35.8* 34.8*  PLT 274 223   BMET Recent Labs    07/10/17 1958 07/12/17 0401  NA 136 137  K 3.9 4.2  CL 104 105  CO2 25 26  GLUCOSE 94 116*  BUN 11 10  CREATININE 1.07 1.11  CALCIUM 10.3 9.8    Studies/Results: Ct Abdomen Pelvis W Contrast  Result Date: 07/11/2017 CLINICAL DATA:  Acute onset of right-sided abdominal and right hip pain.  Leukocytosis. Current history of sickle cell anemia. EXAM: CT ABDOMEN AND PELVIS WITH CONTRAST TECHNIQUE: Multidetector CT imaging of the abdomen and pelvis was performed using the standard protocol following bolus administration of intravenous contrast. CONTRAST:  ISOVUE-300 IOPAMIDOL (ISOVUE-300) INJECTION 61% COMPARISON:  CT of the abdomen and pelvis from 11/07/2008 FINDINGS: Lower chest: Mild bibasilar opacities may reflect atelectasis or possibly mild infection. The visualized portions of the mediastinum are unremarkable. Wall thickening at the distal esophagus may reflect esophagitis or chronic inflammation. Hepatobiliary: A tiny nonspecific 7 mm hypodensity at the hepatic dome is stable from 2010 and likely benign. The liver is otherwise unremarkable. The patient is status post cholecystectomy, with clips noted at the gallbladder fossa. The common bile duct is normal in caliber. Pancreas: The pancreas is within normal limits. Spleen: There is mild atrophy of the spleen, new from the prior study. Adrenals/Urinary Tract: The adrenal glands are unremarkable in appearance. The patient has a right-sided pelvic kidney, which is unremarkable in appearance. The left kidney is also unremarkable. There is no evidence of hydronephrosis. No renal or ureteral stones are identified. No perinephric stranding is seen. Stomach/Bowel: The stomach is unremarkable in appearance. The small bowel is within normal limits. The appendix is not visualized; there is no evidence for appendicitis. The colon is unremarkable in appearance. Vascular/Lymphatic: The abdominal aorta is unremarkable in appearance. The inferior vena cava is grossly unremarkable. No retroperitoneal lymphadenopathy is seen. No pelvic sidewall lymphadenopathy is identified. Reproductive: The bladder is mildly distended and grossly unremarkable. The  prostate remains normal in size. Other: No additional soft tissue abnormalities are seen. Musculoskeletal: No  acute osseous abnormalities are identified. The right hip is grossly unremarkable in appearance, and symmetric with the left hip. The visualized musculature is unremarkable in appearance. IMPRESSION: 1. Mild bibasilar airspace opacities may reflect atelectasis or possibly mild pneumonia. 2. Wall thickening at the distal esophagus may reflect esophagitis or chronic inflammation. 3. Mild splenic atrophy. 4. Right-sided pelvic kidney is unremarkable in appearance. Electronically Signed   By: Roanna Raider M.D.   On: 07/11/2017 06:48    Medications: I have reviewed the patient's current medications.  Assessment/Plan: A 42 year old gentleman admitted with sickle cell painful crisis.  #1 sickle cell painful crisis: patient's pain is improved but still at 8/10.  He has been more dialyzed.  Continue his IV Dilaudid PCA with Toradol and IV fluids.  He is worried about getting his home medications.  #2 sickle cell anemia: Hemoglobin is stable at about 12 g.  Continue to monitor  #3 essential hypertension: continue blood pressure control.  #4 history of asthma: no acute exacerbation.  Continue empiric treatment  #5 obstructive sleep apnea: patient not using any CPAP.   LOS: 1 day   GARBA,LAWAL 07/12/2017, 8:18 AM

## 2017-07-13 DIAGNOSIS — D5700 Hb-SS disease with crisis, unspecified: Secondary | ICD-10-CM

## 2017-07-13 MED ORDER — AMLODIPINE BESYLATE 10 MG PO TABS: 10.0000 mg | ORAL_TABLET | Freq: Every day | ORAL | 1 refills | Status: DC

## 2017-07-13 MED ORDER — ALBUTEROL SULFATE HFA 108 (90 BASE) MCG/ACT IN AERS: 2.0000 | INHALATION_SPRAY | Freq: Four times a day (QID) | RESPIRATORY_TRACT | 1 refills | Status: DC | PRN

## 2017-07-13 MED ORDER — MORPHINE SULFATE ER 60 MG PO TBCR: 60.0000 mg | EXTENDED_RELEASE_TABLET | Freq: Two times a day (BID) | ORAL | 0 refills | Status: AC

## 2017-07-13 MED ORDER — OXYCODONE HCL 30 MG PO TABS: 30.0000 mg | ORAL_TABLET | ORAL | 0 refills | Status: AC | PRN

## 2017-07-13 NOTE — Discharge Instructions (Signed)
Sickle Cell Anemia, Adult °Sickle cell anemia is a condition in which red blood cells have an abnormal “sickle” shape. This abnormal shape shortens the cells’ life span, which results in a lower than normal concentration of red blood cells in the blood. The sickle shape also causes the cells to clump together and block free blood flow through the blood vessels. As a result, the tissues and organs of the body do not receive enough oxygen. Sickle cell anemia causes organ damage and pain and increases the risk of infection. °What are the causes? °Sickle cell anemia is a genetic disorder. Those who receive two copies of the gene have the condition, and those who receive one copy have the trait. °What increases the risk? °The sickle cell gene is most common in people whose families originated in [place]. Other areas of the globe where sickle cell trait occurs include the Mediterranean, [place] and [place], the [place], and the [place]. °What are the signs or symptoms? °· Pain, especially in the extremities, back, chest, or abdomen (common). The pain may start suddenly or may develop following an illness, especially if there is dehydration. Pain can also occur due to overexertion or exposure to extreme temperature changes. °· Frequent severe bacterial infections, especially certain types of pneumonia and meningitis. °· Pain and swelling in the hands and feet. °· Decreased activity. °· Loss of appetite. °· Change in behavior. °· Headaches. °· Seizures. °· Shortness of breath or difficulty breathing. °· Vision changes. °· Skin ulcers. °Those with the trait may not have symptoms or they may have mild symptoms. °How is this diagnosed? °Sickle cell anemia is diagnosed with blood tests that demonstrate the genetic trait. It is often diagnosed during the newborn period, due to mandatory testing nationwide. A variety of blood tests, X-rays, CT scans, MRI scans, ultrasounds, and lung function tests may also be done to  monitor the condition. °How is this treated? °Sickle cell anemia may be treated with: °· Medicines. You may be given pain medicines, antibiotic medicines (to treat and prevent infections) or medicines to increase the production of certain types of hemoglobin. °· Fluids. °· Oxygen. °· Blood transfusions. ° °Follow these instructions at home: °· Drink enough fluid to keep your urine clear or pale yellow. Increase your fluid intake in hot weather and during exercise. °· Do not smoke. Smoking lowers oxygen levels in the blood. °· Only take over-the-counter or prescription medicines for pain, fever, or discomfort as directed by your health care provider. °· Take antibiotics as directed by your health care provider. Make sure you finish them it even if you start to feel better. °· Take supplements as directed by your health care provider. °· Consider wearing a medical alert bracelet. This tells anyone caring for you in an emergency of your condition. °· When traveling, keep your medical information, health care provider's names, and the medicines you take with you at all times. °· If you develop a fever, do not take medicines to reduce the fever right away. This could cover up a problem that is developing. Notify your health care provider. °· Keep all follow-up appointments with your health care provider. Sickle cell anemia requires regular medical care. °Contact a health care provider if: °You have a fever. °Get help right away if: °· You feel dizzy or faint. °· You have new abdominal pain, especially on the left side near the stomach area. °· You develop a persistent, often uncomfortable and painful penile erection (priapism). If this is not   treated immediately it will lead to impotence. °· You have numbness your arms or legs or you have a hard time moving them. °· You have a hard time with speech. °· You have a fever or persistent symptoms for more than 2-3 days. °· You have a fever and your symptoms suddenly get  worse. °· You have signs or symptoms of infection. These include: °? Chills. °? Abnormal tiredness (lethargy). °? Irritability. °? Poor eating. °? Vomiting. °· You develop pain that is not helped with medicine. °· You develop shortness of breath. °· You have pain in your chest. °· You are coughing up pus-like or bloody sputum. °· You develop a stiff neck. °· Your feet or hands swell or have pain. °· Your abdomen appears bloated. °· You develop joint pain. °This information is not intended to replace advice given to you by your health care provider. Make sure you discuss any questions you have with your health care provider. °Document Released: 05/10/2005 Document Revised: 08/20/2015 Document Reviewed: 09/11/2012 °[employer] Interactive Patient Education © 2017 [employer] ° °

## 2017-07-13 NOTE — Discharge Summary (Signed)
Physician Discharge Summary  Dean Webb ZOX:[id number] DOB: 02-16-1975 DOA: 07/10/2017  PCP: Patient, No Pcp Per  Admit date: 07/10/2017  Discharge date: 07/13/2017  Discharge Diagnoses:  Active Problems:   Sickle cell pain crisis (HCC)   Sickle cell anemia with crisis [facility])  Discharge Condition: Stable  Disposition:  Follow-up Information    Quentin Angst, MD. Schedule an appointment as soon as possible for a visit in 1 week(s).   Specialty:  Internal Medicine Contact information: [address] [place] [address] [phone]          Pt is discharged home in good condition and is to follow up with PCP this week to have labs evaluated. Dean Webb is instructed to increase activity slowly and balance with rest for the next few days, and use prescribed medication to complete treatment of pain  Diet: Regular Wt Readings from Last 3 Encounters:  07/12/17 82.6 kg (182 lb 1.6 oz)  05/04/17 81.5 kg (179 lb 10.8 oz)  04/22/17 89.4 kg (197 lb)   History of present illness:  Patient is a 42 year-old gentleman with known history of sickle cell disease who is out of his home medications secondary to losing his primary care physician. Patient was suspected to have bilateral infiltrates and leukocytosis. No evidence of pneumonia. He has been empirically started on antibiotics. CT scan showed bilateral findings. It could be early acute chest syndrome. He's had previous pneumonias. He has some mild shortness of breath.  Pain is 9/10 in his back and legs.  Patient reported that this feels like his typical sickle cell crisis. He has received 3 doses of pain medications in the ER and pain is persistent  Hospital Course:  Patient was admitted for sickle cell pain crisis and managed appropriately with IVF, IV Dilaudid via PCA and IV Toradol, as well as other adjunct therapies per sickle cell pain management protocols. Patient's symptoms slowly improved, pain returned to  baseline, blood pressure was controlled, and patient requested to be discharged home today. He remained hemodynamically stable throughout this admission, no chest pain or SOB. No evidence of infection or ACS. Patient was discharged home today in a hemodynamically stable condition. He was prescribed 2 weeks of pain medications (both short and long acting) and was given appointment to come and [facility].   Discharge Exam: Vitals:   07/13/17 0801 07/13/17 0923  BP:  (!) 144/89  Pulse:  66  Resp: 12 15  Temp:  98.7 F (37.1 C)  SpO2: 100% 100%   Vitals:   07/13/17 0421 07/13/17 0452 07/13/17 0801 07/13/17 0923  BP:  119/89  (!) 144/89  Pulse:  (!) 57  66  Resp: Temp:  98.3 F (36.8 C)  98.7 F (37.1 C)  TempSrc:  Oral  Oral  SpO2: 100% 100% 100% 100%  Weight:      Height:        General appearance : Awake, alert, not in any distress. Speech Clear. Not toxic looking HEENT: Atraumatic and Normocephalic, pupils equally reactive to light and accomodation Neck: Supple, no JVD. No cervical lymphadenopathy.  Chest: Good air entry bilaterally, no added sounds  CVS: S1 S2 regular, no murmurs.  Abdomen: Bowel sounds present, Non tender and not distended with no gaurding, rigidity or rebound. Extremities: B/L Lower Ext shows no edema, both legs are warm to touch Neurology: Awake alert, and oriented X 3, CN II-XII intact, Non  focal Skin: No Rash  Discharge Instructions  Discharge Instructions    Diet - low sodium heart healthy   Complete by:  As directed    Increase activity slowly   Complete by:  As directed      Allergies as of 07/13/2017   No Known Allergies     Medication List    TAKE these medications   albuterol 108 (90 Base) MCG/ACT inhaler Commonly known as:  PROVENTIL HFA;VENTOLIN HFA Inhale 2 puffs into the lungs every 6 (six) hours as needed for wheezing or shortness of breath.   amLODipine 10 MG tablet Commonly known  as:  NORVASC Take 1 tablet (10 mg total) by mouth daily.   cetirizine 10 MG tablet Commonly known as:  ZYRTEC Take 1 tablet (10 mg total) by mouth daily as needed for allergies.   ibuprofen 800 MG tablet Commonly known as:  ADVIL,MOTRIN Take 1 tablet (800 mg total) by mouth every 8 (eight) hours as needed. What changed:  reasons to take this   morphine 60 MG 12 hr tablet Commonly known as:  MS CONTIN Take 1 tablet (60 mg total) by mouth every 12 (twelve) hours for 15 days.   oxycodone 30 MG immediate release tablet Commonly known as:  ROXICODONE Take 1 tablet (30 mg total) by mouth every 4 (four) hours as needed for up to 15 days for pain.       The results of significant diagnostics from this hospitalization (including imaging, microbiology, ancillary and laboratory) are listed below for reference.    Significant Diagnostic Studies: Ct Abdomen Pelvis W Contrast  Result Date: 07/11/2017 CLINICAL DATA:  Acute onset of right-sided abdominal and right hip pain. Leukocytosis. Current history of sickle cell anemia. EXAM: CT ABDOMEN AND PELVIS WITH CONTRAST TECHNIQUE: Multidetector CT imaging of the abdomen and pelvis was performed using the standard protocol following bolus administration of intravenous contrast. CONTRAST:  ISOVUE-300 IOPAMIDOL (ISOVUE-300) INJECTION 61% COMPARISON:  CT of the abdomen and pelvis from 11/07/2008 FINDINGS: Lower chest: Mild bibasilar opacities may reflect atelectasis or possibly mild infection. The visualized portions of the mediastinum are unremarkable. Wall thickening at the distal esophagus may reflect esophagitis or chronic inflammation. Hepatobiliary: A tiny nonspecific 7 mm hypodensity at the hepatic dome is stable from 2010 and likely benign. The liver is otherwise unremarkable. The patient is status post cholecystectomy, with clips noted at the gallbladder fossa. The common bile duct is normal in caliber. Pancreas: The pancreas is within normal  limits. Spleen: There is mild atrophy of the spleen, new from the prior study. Adrenals/Urinary Tract: The adrenal glands are unremarkable in appearance. The patient has a right-sided pelvic kidney, which is unremarkable in appearance. The left kidney is also unremarkable. There is no evidence of hydronephrosis. No renal or ureteral stones are identified. No perinephric stranding is seen. Stomach/Bowel: The stomach is unremarkable in appearance. The small bowel is within normal limits. The appendix is not visualized; there is no evidence for appendicitis. The colon is unremarkable in appearance. Vascular/Lymphatic: The abdominal aorta is unremarkable in appearance. The inferior vena cava is grossly unremarkable. No retroperitoneal lymphadenopathy is seen. No pelvic sidewall lymphadenopathy is identified. Reproductive: The bladder is mildly distended and grossly unremarkable. The prostate remains normal in size. Other: No additional soft tissue abnormalities are seen. Musculoskeletal: No acute osseous abnormalities are identified. The right hip is grossly unremarkable in appearance, and symmetric with the left hip. The visualized musculature is unremarkable in appearance. IMPRESSION: 1. Mild bibasilar airspace opacities  may reflect atelectasis or possibly mild pneumonia. 2. Wall thickening at the distal esophagus may reflect esophagitis or chronic inflammation. 3. Mild splenic atrophy. 4. Right-sided pelvic kidney is unremarkable in appearance. Electronically Signed   By: Roanna Raider M.D.   On: 07/11/2017 06:48    Microbiology: No results found for this or any previous visit (from the past 240 hour(s)).   Labs: Basic Metabolic Panel: Recent Labs  Lab 07/10/17 1958 07/12/17 0401  NA 136 137  K 3.9 4.2  CL 104 105  CO2 25 26  GLUCOSE 94 116*  BUN 11 10  CREATININE 1.07 1.11  CALCIUM 10.3 9.8   Liver Function Tests: Recent Labs  Lab 07/10/17 1958 07/12/17 0401  AST 21 29  ALT 15* 24   ALKPHOS 80 74  BILITOT 1.4* 1.7*  PROT 7.4 6.5  ALBUMIN 4.4 4.0   Recent Labs  Lab 07/11/17 0016  LIPASE 27   No results for input(s): AMMONIA in the last 168 hours. CBC: Recent Labs  Lab 07/10/17 1958 07/12/17 0401  WBC 11.8* 10.5  NEUTROABS 6.3 5.3  HGB 13.2 12.8*  HCT 35.8* 34.8*  MCV 79.2 78.4  PLT 274 223   Cardiac Enzymes: No results for input(s): CKTOTAL, CKMB, CKMBINDEX, TROPONINI in the last 168 hours. BNP: Invalid input(s): POCBNP CBG: No results for input(s): GLUCAP in the last 168 hours.  Time coordinating discharge: 50 minutes  Signed:  Verle Webb  [facility] 07/13/2017, 11:23 AM

## 2017-07-30 ENCOUNTER — Ambulatory Visit: Payer: Self-pay | Admitting: Family Medicine

## 2017-08-03 ENCOUNTER — Other Ambulatory Visit: Payer: Self-pay

## 2017-08-03 ENCOUNTER — Ambulatory Visit (INDEPENDENT_AMBULATORY_CARE_PROVIDER_SITE_OTHER): Payer: Medicaid Other | Admitting: Family Medicine

## 2017-08-03 ENCOUNTER — Encounter: Payer: Self-pay | Admitting: Family Medicine

## 2017-08-03 VITALS — BP 128/90 | Temp 98.7°F | Resp 14 | Ht 67.0 in | Wt 181.0 lb

## 2017-08-03 DIAGNOSIS — Z23 Encounter for immunization: Secondary | ICD-10-CM | POA: Diagnosis not present

## 2017-08-03 DIAGNOSIS — D5720 Sickle-cell/Hb-C disease without crisis: Secondary | ICD-10-CM

## 2017-08-03 DIAGNOSIS — F17200 Nicotine dependence, unspecified, uncomplicated: Secondary | ICD-10-CM

## 2017-08-03 DIAGNOSIS — I10 Essential (primary) hypertension: Secondary | ICD-10-CM | POA: Diagnosis not present

## 2017-08-03 DIAGNOSIS — R1084 Generalized abdominal pain: Secondary | ICD-10-CM

## 2017-08-03 DIAGNOSIS — Z79891 Long term (current) use of opiate analgesic: Secondary | ICD-10-CM | POA: Diagnosis not present

## 2017-08-03 LAB — POCT URINALYSIS DIPSTICK
Bilirubin, UA: NEGATIVE
Glucose, UA: NEGATIVE
Ketones, UA: NEGATIVE
NITRITE UA: NEGATIVE
PH UA: 5.5 (ref 5.0–8.0)
PROTEIN UA: NEGATIVE
RBC UA: NEGATIVE
Spec Grav, UA: 1.02 (ref 1.010–1.025)
UROBILINOGEN UA: 0.2 U/dL

## 2017-08-03 MED ORDER — L-GLUTAMINE ORAL POWDER: 10.0000 g | PACK | Freq: Two times a day (BID) | ORAL | 5 refills | Status: DC

## 2017-08-03 MED ORDER — FOLIC ACID 1 MG PO TABS: 1.0000 mg | ORAL_TABLET | Freq: Every day | ORAL | 1 refills | Status: DC

## 2017-08-03 MED ORDER — MORPHINE SULFATE ER 60 MG PO TBCR
60.0000 mg | EXTENDED_RELEASE_TABLET | Freq: Two times a day (BID) | ORAL | 0 refills | Status: AC
Start: 2017-08-03 — End: 2017-08-18

## 2017-08-03 MED ORDER — AMLODIPINE BESYLATE 10 MG PO TABS: 10.0000 mg | ORAL_TABLET | Freq: Every day | ORAL | 1 refills | Status: DC

## 2017-08-03 MED ORDER — OXYCODONE HCL 15 MG PO TABS: 15.0000 mg | ORAL_TABLET | Freq: Four times a day (QID) | ORAL | 0 refills | Status: AC | PRN

## 2017-08-03 NOTE — Patient Instructions (Addendum)
Thanks for establishing care.  We will follow-up by phone with any abnormal laboratory results.  We will continue MS Contin 60 mg every 12 hours and oxycodone 15 mg as prescribed.  Only take medicine for moderate to severe pain.  Will hold ibuprofen until reviewing abdominal ultrasound.  We will follow-up by phone after ultrasound.  Will start sickle cell maintenance with folic acid 1 mg daily and entirely twice daily.  Add in diary to warm or room temperature drink twice daily.  Continue 64 ounces of water per day.  Recommend a balanced diet.  We will continue antihypertensive medication for blood pressure control.  Your blood pressure is at goal on today, no medication changes warranted.  Continue to watch her daily sodium intake.  Discussed smoking cessation, establish a quit date.  Will discuss options to help with smoking cessation on follow-up appointment in 1 month.  Sickle Cell Anemia, Adult Sickle cell anemia is a condition where your red blood cells are shaped like sickles. Red blood cells carry oxygen through the body. Sickle-shaped red blood cells do not live as long as normal red blood cells. They also clump together and block blood from flowing through the blood vessels. These things prevent the body from getting enough oxygen. Sickle cell anemia causes organ damage and pain. It also increases the risk of infection. Follow these instructions at home:  Drink enough fluid to keep your pee (urine) clear or pale yellow. Drink more in hot weather and during exercise.  Do not smoke. Smoking lowers oxygen levels in the blood.  Only take over-the-counter or prescription medicines as told by your doctor.  Take antibiotic medicines as told by your doctor. Make sure you finish them even if you start to feel better.  Take supplements as told by your doctor.  Consider wearing a medical alert bracelet. This tells anyone caring for you in an emergency of your condition.  When traveling, keep your  medical information, doctors' names, and the medicines you take with you at all times.  If you have a fever, do not take fever medicines right away. This could cover up a problem. Tell your doctor.  Keep all follow-up visits with your doctor. Sickle cell anemia requires regular medical care. Contact a doctor if: You have a fever. Get help right away if:  You feel dizzy or faint.  You have new belly (abdominal) pain, especially on the left side near the stomach area.  You have a lasting, often uncomfortable and painful erection of the penis (priapism). If it is not treated right away, you will become unable to have sex (impotence).  You have numbness in your arms or legs or you have a hard time moving them.  You have a hard time talking.  You have a fever or lasting symptoms for more than 2-3 days.  You have a fever and your symptoms suddenly get worse.  You have signs or symptoms of infection. These include: ? Chills. ? Being more tired than normal (lethargy). ? Irritability. ? Poor eating. ? Throwing up (vomiting).  You have pain that is not helped with medicine.  You have shortness of breath.  You have pain in your chest.  You are coughing up pus-like or bloody mucus.  You have a stiff neck.  Your feet or hands swell or have pain.  Your belly looks bloated.  Your joints hurt. This information is not intended to replace advice given to you by your health care provider. Make sure you  discuss any questions you have with your health care provider. Document Released: 11/20/2012 Document Revised: 07/08/2015 Document Reviewed: 09/11/2012 Elsevier Interactive Patient Education  2017 [employer].

## 2017-08-03 NOTE — Progress Notes (Signed)
Chief Complaint  Patient presents with  . Hypertension  . Establish Care  . Medication Refill  . Abdominal Pain    x month constant     Subjective:    Patient ID: Dean Webb, male    DOB: 11/24/1975, 42 y.o.   MRN: [id number]  HPI Dean Webb, a 42 year old male with a history of sickle cell anemia, HbSC, hypertension, and tobacco dependence presents to establish care.  Dean Webb was a patient of Dr.[name] McKenzie prior to establishing care.  Patient states that he is primarily been using the emergency department for all primary needs over the past several months.  Patient states that he was given a prescription for opiate medications after previous admissions.  Patient has been out of opiate medications for greater than a week.  Current pain intensity is 7/10 primarily to left hip.  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  Patient is not on folic acid or hydroxyurea.  He is scheduled to establish care with hematology.  Patient characterizes current pain as constant and aching.  He denies headache, fatigue, chest pain, heart palpitations, nausea, vomiting, or diarrhea. Patient also has a history of hypertension.  Hypertension has been controlled on amlodipine.  Patient does not monitor blood pressure at home.  He does not follow a low-fat, low-sodium diet or exercise routinely.  He states that his activity is very limited throughout the day due to chronic pain.  Patient's cardiac risk factors include being an everyday tobacco user.  Patient typically smokes 1/2 pack of cigarettes per day.  He is attempted to quit in the past without success. Past Medical History:  Diagnosis Date  . Asthma   . GERD (gastroesophageal reflux disease)   . Hb-S/Hb-C disease (HCC) 1979  . HTN (hypertension) 2008  . Hypertension   . Sickle cell anemia (HCC)   . Sickle cell disease (HCC)    Social History   Socioeconomic History  . Marital status: Single    Spouse name: Not on file  . Number  of children: Not on file  . Years of education: Not on file  . Highest education level: Not on file  Occupational History  . Not on file  Social Needs  . Financial resource strain: Not on file  . Food insecurity:    Worry: Not on file    Inability: Not on file  . Transportation needs:    Medical: Not on file    Non-medical: Not on file  Tobacco Use  . Smoking status: Current Every Day Smoker    Packs/day: 0.50    Years: 17.00    Pack years: 8.50  . Smokeless tobacco: Never Used  Substance and Sexual Activity  . Alcohol use: Yes    Comment: Occasional drinker  . Drug use: Not Currently    Types: Marijuana  . Sexual activity: Yes  Lifestyle  . Physical activity:    Days per week: Not on file    Minutes per session: Not on file  . Stress: Not on file  Relationships  . Social connections:    Talks on phone: Not on file    Gets together: Not on file    Attends religious service: Not on file    Active member of club or organization: Not on file    Attends meetings of clubs or organizations: Not on file    Relationship status: Not on file  . Intimate partner violence:    Fear of current or  ex partner: Not on file    Emotionally abused: Not on file    Physically abused: Not on file    Forced sexual activity: Not on file  Other Topics Concern  . Not on file  Social History Narrative  . Not on file    Immunization History  Administered Date(s) Administered  . Influenza,inj,Quad PF,6+ Mos 03/09/2016  . Pneumococcal Polysaccharide-23 03/09/2016    Review of Systems  Constitutional: Negative for fatigue.  HENT: Negative.   Eyes: Negative for photophobia, pain, discharge, redness, itching and visual disturbance.  Respiratory: Negative.   Cardiovascular: Negative.  Negative for palpitations and leg swelling.  Gastrointestinal: Negative.  Negative for constipation and diarrhea.  Endocrine: Negative for polydipsia, polyphagia and polyuria.  Genitourinary: Negative.   Negative for dysuria, hematuria, testicular pain and urgency.  Musculoskeletal: Positive for arthralgias and back pain.  Allergic/Immunologic: Negative for immunocompromised state.  Neurological: Negative.   Hematological: Negative.  Negative for adenopathy. Does not bruise/bleed easily.  Psychiatric/Behavioral: Negative.  Negative for dysphoric mood, hallucinations, self-injury, sleep disturbance and suicidal ideas. The patient is not nervous/anxious and is not hyperactive.        Objective:   Physical Exam  Constitutional: He is oriented to person, place, and time. He appears well-developed and well-nourished.  HENT:  Head: Normocephalic.  Eyes: Pupils are equal, round, and reactive to light.  Cardiovascular: Normal rate and normal heart sounds.  Pulmonary/Chest: Effort normal.  Abdominal: Normal appearance and bowel sounds are normal.  Neurological: He is alert and oriented to person, place, and time.  Skin: Skin is warm and dry.  Psychiatric: He has a normal mood and affect. His behavior is normal.      BP 128/90 (BP Location: Right Arm, Patient Position: Sitting, Cuff Size: Large)   Temp 98.7 F (37.1 C) (Oral)   Resp 14   Ht 5\' 7"  (1.702 m)   Wt 181 lb (82.1 kg)   SpO2 100%   BMI 28.35 kg/m  Assessment & Plan:  1. Sickle cell-hemoglobin C disease without crisis (HCC) Will start  folic acid 1 mg daily to prevent aplastic bone marrow crises.  The findings showed a 25% reduction in the frequency of sickle-cell crises, a 33% decrease in hospitalisation rates, fewer hospital visits due to sickle-cell pain and 60% fewer occurrences of acute chest syndrome in patients administered with Endari.    Pulmonary evaluation - Patient denies severe recurrent wheezes, shortness of breath with exercise, or persistent cough. If these symptoms develop, pulmonary function tests with spirometry will be ordered, and if abnormal, plan on referral to Pulmonology for further evaluation.   Cardiac  - Routine screening for pulmonary hypertension is not recommended.  Eye - High risk of proliferative retinopathy. Annual eye exam with retinal exam recommended to patient.   Acute and chronic painful episodes - We agreed on Morphine 60 mg every 12 hours and Oxycodone reduced to 15 mg every 6 hoursplan on titrating her Medication dose. We discussed that pt is to receive her Schedule II prescriptions only from us. Pt is also aware that the prescription history is available to us online through the [facility]. Controlled substance agreement signed 08/03/2017. We reminded Dorinda HillDonald that all patients receiving Schedule II narcotics must be seen for follow within one month of prescription being requested. We reviewed the terms of our pain agreement, including the need to keep medicines in a safe locked location away from children or pets, and the need to report excess sedation or constipation, measures  to avoid constipation, and policies related to early refills and stolen prescriptions. According to the [place] [facility] program, we have reviewed details related to analgesia, adverse effects, aberrant behaviors.  - Ambulatory referral to Ophthalmology - CBC with Differential - Comprehensive metabolic panel - Vitamin D, 25-hydroxy - Ferritin - EKG 12-Lead - Urinalysis Dipstick - folic acid (FOLVITE) 1 MG tablet; Take 1 tablet (1 mg total) by mouth daily.  Dispense: 90 tablet; Refill: 1 - L-glutamine (ENDARI) 5 g PACK Powder Packet; Take 10 g by mouth 2 (two) times daily.  Dispense: 180 each; Refill: 5 - morphine (MS CONTIN) 60 MG 12 hr tablet; Take 1 tablet (60 mg total) by mouth every 12 (twelve) hours for 15 days.  Dispense: 30 tablet; Refill: 0 - oxycodone (ROXICODONE) 15 MG immediate release tablet; Take 1 tablet (15 mg total) by mouth every 6 (six) hours as needed for up to 15 days for pain.  Dispense: 60 tablet; Refill: 0  2. Need for Tdap vaccination - Tdap vaccine greater than or equal to 7yo  IM  3. Generalized abdominal pain - US Abdomen Complete; Future - Helicobacter pylori abs-IgG+IgA, bld  4. Chronic prescription opiate use - [id number]+OXYCODONE+CRT-UNBUND - morphine (MS CONTIN) 60 MG 12 hr tablet; Take 1 tablet (60 mg total) by mouth every 12 (twelve) hours for 15 days.  Dispense: 30 tablet; Refill: 0 - oxycodone (ROXICODONE) 15 MG immediate release tablet; Take 1 tablet (15 mg total) by mouth every 6 (six) hours as needed for up to 15 days for pain.  Dispense: 60 tablet; Refill: 0  5. Tobacco dependence Smoking cessation instruction/counseling given:  counseled patient on the dangers of tobacco use, advised patient to stop smoking, and reviewed strategies to maximize success  6. Essential hypertension Blood pressure is at goal on current medication regimen. No changes warranted on today.  - amLODipine (NORVASC) 10 MG tablet; Take 1 tablet (10 mg total) by mouth daily.  Dispense: 90 tablet; Refill: 1   RTC: 1 month for sickle cell anemia and medication management   Nolon Nations  MSN, FNP-C [facility] [address] [place], [place] [address] [phone]

## 2017-08-04 LAB — MED LIST OPTION NOT SELECTED

## 2017-08-05 ENCOUNTER — Emergency Department (HOSPITAL_COMMUNITY)
Admission: EM | Admit: 2017-08-05 | Discharge: 2017-08-06 | Disposition: A | Discharge status: Home / Self Care | Payer: Medicaid Other | Attending: Emergency Medicine | Admitting: Emergency Medicine

## 2017-08-05 ENCOUNTER — Emergency Department (HOSPITAL_COMMUNITY): Payer: Medicaid Other

## 2017-08-05 ENCOUNTER — Encounter (HOSPITAL_COMMUNITY): Payer: Self-pay | Admitting: Emergency Medicine

## 2017-08-05 DIAGNOSIS — I10 Essential (primary) hypertension: Secondary | ICD-10-CM | POA: Insufficient documentation

## 2017-08-05 DIAGNOSIS — R1011 Right upper quadrant pain: Secondary | ICD-10-CM

## 2017-08-05 DIAGNOSIS — J45909 Unspecified asthma, uncomplicated: Secondary | ICD-10-CM | POA: Diagnosis not present

## 2017-08-05 DIAGNOSIS — F17210 Nicotine dependence, cigarettes, uncomplicated: Secondary | ICD-10-CM | POA: Diagnosis not present

## 2017-08-05 DIAGNOSIS — D57219 Sickle-cell/Hb-C disease with crisis, unspecified: Secondary | ICD-10-CM | POA: Diagnosis not present

## 2017-08-05 DIAGNOSIS — Z79899 Other long term (current) drug therapy: Secondary | ICD-10-CM | POA: Insufficient documentation

## 2017-08-05 LAB — COMPREHENSIVE METABOLIC PANEL
ALBUMIN: 4.6 g/dL (ref 3.5–5.0)
ALK PHOS: 92 U/L (ref 38–126)
ALT: 17 U/L (ref 17–63)
ANION GAP: 7 (ref 5–15)
AST: 21 U/L (ref 15–41)
BUN: 9 mg/dL (ref 6–20)
CHLORIDE: 108 mmol/L (ref 101–111)
CO2: 24 mmol/L (ref 22–32)
Calcium: 11.4 mg/dL — ABNORMAL HIGH (ref 8.9–10.3)
Creatinine, Ser: 1.19 mg/dL (ref 0.61–1.24)
GFR calc non Af Amer: >60 mL/min (ref >60)
GLUCOSE: 102 mg/dL — AB (ref 65–99)
Potassium: 4.3 mmol/L (ref 3.5–5.1)
Sodium: 139 mmol/L (ref 135–145)
Total Bilirubin: 2.1 mg/dL — ABNORMAL HIGH (ref 0.3–1.2)
Total Protein: 7.8 g/dL (ref 6.5–8.1)

## 2017-08-05 LAB — CBC
HCT: 39.8 % (ref 39.0–52.0)
HEMOGLOBIN: 14.8 g/dL (ref 13.0–17.0)
MCH: 29.9 pg (ref 26.0–34.0)
MCHC: 37.2 g/dL — AB (ref 30.0–36.0)
MCV: 80.4 fL (ref 78.0–100.0)
Platelets: 317 10*3/uL (ref 150–400)
RBC: 4.95 MIL/uL (ref 4.22–5.81)
RDW: 16.7 % — ABNORMAL HIGH (ref 11.5–15.5)
WBC: 9.9 10*3/uL (ref 4.0–10.5)

## 2017-08-05 LAB — LIPASE, BLOOD: LIPASE: 28 U/L (ref 11–51)

## 2017-08-05 MED ORDER — PROMETHAZINE HCL 25 MG/ML IJ SOLN
25.0000 mg | Freq: Once | INTRAMUSCULAR | Status: AC
  Administered 2017-08-05: 25 mg via INTRAVENOUS
  Filled 2017-08-05: qty 1

## 2017-08-05 MED ORDER — HYDROMORPHONE HCL 2 MG/ML IJ SOLN
2.0000 mg | Freq: Once | INTRAMUSCULAR | Status: AC
  Administered 2017-08-05: 2 mg via INTRAVENOUS
  Filled 2017-08-05: qty 1

## 2017-08-05 MED ORDER — SODIUM CHLORIDE 0.9 % IV BOLUS
1000.0000 mL | Freq: Once | INTRAVENOUS | Status: AC
  Administered 2017-08-05: 1000 mL via INTRAVENOUS

## 2017-08-05 NOTE — ED Triage Notes (Signed)
Patient here from home with complaints of abdominal pain, nausea x3 weeks. States that he is scheduled for a US on Tuesday but pain is unbearable.

## 2017-08-05 NOTE — ED Notes (Signed)
Bed: WA06 Expected date:  Expected time:  Means of arrival:  Comments: 

## 2017-08-05 NOTE — ED Notes (Signed)
Pt. Made aware for the need of urine specimen. 

## 2017-08-05 NOTE — ED Provider Notes (Signed)
[facility] DEPT Provider Note: Lowella Dell, MD, FACEP  CSN: [id number] MRN: [id number] ARRIVAL: 08/05/17 at 2103 ROOM: WA06/WA06   CHIEF COMPLAINT  Abdominal Pain   HISTORY OF PRESENT ILLNESS  08/05/17 10:43 PM Dean Webb is a 42 y.o. male with sickle cell disease who is been having abdominal pain for the past 2 weeks.  The pain is in the upper abdomen more prominent in the right upper quadrant.  He rates his pain as a 10 out of 10.  Pain is worse with movement or palpation.  He states the pain occurs on a daily basis.  He does not correlate it with eating.  It is associated with nausea but no vomiting, diarrhea or fever.  He was scheduled for an ultrasound the day after tomorrow but states he could not wait.  He is also having pain in his legs which she attributes to typical sickle cell pain.  He also rates his leg pain as a 10 out of 10.   Past Medical History:  Diagnosis Date  . Asthma   . GERD (gastroesophageal reflux disease)   . Hb-S/Hb-C disease (HCC) 1979  . HTN (hypertension) 2008  . Hypertension   . Sickle cell anemia (HCC)   . Sickle cell disease (HCC)     Past Surgical History:  Procedure Laterality Date  . CHOLECYSTECTOMY      Family History  Problem Relation Age of Onset  . Sickle cell trait Mother   . Hypertension Mother   . Asthma Mother   . Sickle cell trait Father   . Congestive Heart Failure Father   . Hypertension Sister   . Sickle cell anemia Daughter   . Hypertension Daughter   . Diabetes Daughter     Social History   Tobacco Use  . Smoking status: Current Every Day Smoker    Packs/day: 0.50    Years: 17.00    Pack years: 8.50  . Smokeless tobacco: Never Used  Substance Use Topics  . Alcohol use: Yes    Comment: Occasional drinker  . Drug use: Not Currently    Types: Marijuana    Prior to Admission medications   Medication Sig Start Date End Date Taking? Authorizing Provider  albuterol (PROVENTIL HFA;VENTOLIN HFA) 108  (90 Base) MCG/ACT inhaler Inhale 2 puffs into the lungs every 6 (six) hours as needed for wheezing or shortness of breath. 07/13/17  Yes Jegede, Olugbemiga E, MD  amLODipine (NORVASC) 10 MG tablet Take 1 tablet (10 mg total) by mouth daily. 08/03/17  Yes Massie Maroon, FNP  folic acid (FOLVITE) 1 MG tablet Take 1 tablet (1 mg total) by mouth daily. 08/03/17  Yes Massie Maroon, FNP  ibuprofen (ADVIL,MOTRIN) 800 MG tablet Take 1 tablet (800 mg total) by mouth every 8 (eight) hours as needed. Patient taking differently: Take 800 mg by mouth every 8 (eight) hours as needed for headache, mild pain or moderate pain.  04/23/17  Yes Altha Harm, MD  morphine (MS CONTIN) 60 MG 12 hr tablet Take 1 tablet (60 mg total) by mouth every 12 (twelve) hours for 15 days. 08/03/17 08/18/17 Yes Massie Maroon, FNP  oxycodone (ROXICODONE) 15 MG immediate release tablet Take 1 tablet (15 mg total) by mouth every 6 (six) hours as needed for up to 15 days for pain. 08/03/17 08/18/17 Yes Massie Maroon, FNP  cetirizine (ZYRTEC) 10 MG tablet Take 1 tablet (10 mg total) by mouth daily as needed for allergies. 04/23/17  Altha HarmMatthews, Michelle A, MD  L-glutamine (ENDARI) 5 g PACK Powder Packet Take 10 g by mouth 2 (two) times daily. 08/03/17   Massie MaroonHollis, Lachina M, FNP    Allergies Patient has no known allergies.   REVIEW OF SYSTEMS  Negative except as noted here or in the History of Present Illness.   PHYSICAL EXAMINATION  Initial Vital Signs Blood pressure (!) 152/105, pulse 91, temperature 98.9 F (37.2 C), temperature source Oral, resp. rate 18, SpO2 100 %.  Examination General: Well-developed, well-nourished male in no acute distress; appearance consistent with age of record HENT: normocephalic; atraumatic Eyes: pupils equal, round and reactive to light; extraocular muscles intact Neck: supple Heart: regular rate and rhythm Lungs: clear to auscultation bilaterally Abdomen: soft; nondistended; right upper  quadrant tenderness; bowel sounds present Extremities: No deformity; full range of motion; pulses normal Neurologic: Awake, alert and oriented; motor function intact in all extremities and symmetric; no facial droop Skin: Warm and dry Psychiatric: Flat affect   RESULTS  Summary of this visit's results, reviewed by myself:   EKG Interpretation  Date/Time:    Ventricular Rate:    PR Interval:    QRS Duration:   QT Interval:    QTC Calculation:   R Axis:     Text Interpretation:        Laboratory Studies: Results for orders placed or performed during the hospital encounter of 08/05/17 (from the past 24 hour(s))  Lipase, blood     Status: None   Collection Time: 08/05/17  9:27 PM  Result Value Ref Range   Lipase 28 11 - 51 U/L  Comprehensive metabolic panel     Status: Abnormal   Collection Time: 08/05/17  9:27 PM  Result Value Ref Range   Sodium 139 135 - 145 mmol/L   Potassium 4.3 3.5 - 5.1 mmol/L   Chloride 108 101 - 111 mmol/L   CO2 24 22 - 32 mmol/L   Glucose, Bld 102 (H) 65 - 99 mg/dL   BUN 9 6 - 20 mg/dL   Creatinine, Ser 5.781.19 0.61 - 1.24 mg/dL   Calcium 46.911.4 (H) 8.9 - 10.3 mg/dL   Total Protein 7.8 6.5 - 8.1 g/dL   Albumin 4.6 3.5 - 5.0 g/dL   AST 21 15 - 41 U/L   ALT 17 17 - 63 U/L   Alkaline Phosphatase 92 38 - 126 U/L   Total Bilirubin 2.1 (H) 0.3 - 1.2 mg/dL   GFR calc non Af Amer >60 >60 mL/min   GFR calc Af Amer >60 >60 mL/min   Anion gap 7 5 - 15  CBC     Status: Abnormal   Collection Time: 08/05/17  9:27 PM  Result Value Ref Range   WBC 9.9 4.0 - 10.5 K/uL   RBC 4.95 4.22 - 5.81 MIL/uL   Hemoglobin 14.8 13.0 - 17.0 g/dL   HCT 62.939.8 52.839.0 - 41.352.0 %   MCV 80.4 78.0 - 100.0 fL   MCH 29.9 26.0 - 34.0 pg   MCHC 37.2 (H) 30.0 - 36.0 g/dL   RDW 24.416.7 (H) 01.011.5 - 27.215.5 %   Platelets 317 150 - 400 K/uL   Imaging Studies: [place] Abdomen Complete  Result Date: 08/06/2017 CLINICAL DATA:  Initial evaluation for upper abdominal pain. History of sickle cell  disease, hypertension EXAM: ABDOMEN ULTRASOUND COMPLETE COMPARISON:  None. FINDINGS: Gallbladder: Surgically absent. No abnormality at the gallbladder fossa. No sonographic Murphy sign elicited on exam. Common bile duct: Diameter: 8.2 mm Liver: No  focal lesion identified. Within normal limits in parenchymal echogenicity. Portal vein is patent on color Doppler imaging with normal direction of blood flow towards the liver. IVC: No abnormality visualized. Pancreas: Visualized portion unremarkable. Spleen: Spleen is atrophic in appearance measuring 6.3 cm with coarse echogenic echotexture and lobulated contour, similar prior CT. Right Kidney: Length: 9.4 cm. Right kidney position within the right pelvis. Echogenicity within normal limits. No mass or hydronephrosis visualized. Left Kidney: Length: 10.2 cm. Echogenicity within normal limits. No mass or hydronephrosis visualized. Abdominal aorta: No aneurysm visualized. Other findings: None. IMPRESSION: 1. No acute abnormality within the abdomen. 2. 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 3. Pelvic right kidney.  No hydronephrosis. 4. Status post cholecystectomy. Electronically Signed   By: Rise Mu M.D.   On: 08/06/2017 00:43    ED COURSE and MDM  Nursing notes and initial vitals signs, including pulse oximetry, reviewed.  Vitals:   08/06/17 0330 08/06/17 0400 08/06/17 0430 08/06/17 0500  BP: 118/83 125/86 (!) 119/92 117/86  Pulse: (!) 58 60 82 72  Resp:    20  Temp:      TempSrc:      SpO2: 92% 94% 94% 94%   1:21 AM Patient somnolent at this time but breathing and maintaining his airway.  He has only been given 1 dose of Dilaudid.  His ultrasound is unremarkable showing known changes.  5:19 AM Patient still somnolent but arousable.  When left alone he falls back asleep.  He has not been given any additional pain medications since the first dose about 11 PM.  5:54 AM Now  more alert.  Patient states his home hydrocodone has not been adequately treating his pain.  He was advised that inpatient admission would be difficult given that he has slept 7 hours after 1 dose of IV Dilaudid.  Consultation with the [facility] PMP Aware database reveals that the patient has been receiving prescriptions for oxycodone 15 mg and morphine sulfate ER 60 mg tablets on a regular basis.  Given that this contradicts what he told me in the presence of his nurse, Junior, I do not believe additional narcotics are justified.  PROCEDURES    ED DIAGNOSES     ICD-10-CM   1. Right upper quadrant abdominal pain R10.11   2. Sickle-cell/Hb-C disease with pain (HCC) D57.219        Paula Libra, MD 08/06/17 [phone]

## 2017-08-07 ENCOUNTER — Ambulatory Visit (HOSPITAL_COMMUNITY): Admission: RE | Admit: 2017-08-07 | Payer: Medicaid Other | Source: Ambulatory Visit

## 2017-08-07 ENCOUNTER — Telehealth: Payer: Self-pay | Admitting: Family Medicine

## 2017-08-07 LAB — HELICOBACTER PYLORI ABS-IGG+IGA, BLD
H PYLORI IGG: 5.85 {index_val} — AB (ref 0.00–0.79)
H. PYLORI, IGA ABS: 30.9 U — AB (ref 0.0–8.9)

## 2017-08-07 LAB — CBC WITH DIFFERENTIAL/PLATELET
BASOS ABS: 0.1 10*3/uL (ref 0.0–0.2)
Basos: 1 %
EOS (ABSOLUTE): 0.3 10*3/uL (ref 0.0–0.4)
Eos: 3 %
Hematocrit: 42.7 % (ref 37.5–51.0)
Hemoglobin: 14.2 g/dL (ref 13.0–17.7)
IMMATURE GRANS (ABS): 0 10*3/uL (ref 0.0–0.1)
Immature Granulocytes: 0 %
LYMPHS: 27 %
Lymphocytes Absolute: 2.4 10*3/uL (ref 0.7–3.1)
MCH: 29 pg (ref 26.6–33.0)
MCHC: 33.3 g/dL (ref 31.5–35.7)
MCV: 87 fL (ref 79–97)
MONOCYTES: 11 %
Monocytes Absolute: 0.9 10*3/uL (ref 0.1–0.9)
NEUTROS PCT: 58 %
NRBC: 2 % — AB (ref 0–0)
Neutrophils Absolute: 5.2 10*3/uL (ref 1.4–7.0)
PLATELETS: 349 10*3/uL (ref 150–450)
RBC: 4.9 x10E6/uL (ref 4.14–5.80)
RDW: 16.9 % — ABNORMAL HIGH (ref 12.3–15.4)
WBC: 8.9 10*3/uL (ref 3.4–10.8)

## 2017-08-07 LAB — FERRITIN: FERRITIN: 198 ng/mL (ref 30–400)

## 2017-08-07 LAB — COMPREHENSIVE METABOLIC PANEL
A/G RATIO: 1.8 (ref 1.2–2.2)
ALT: 11 IU/L (ref 0–44)
AST: 17 IU/L (ref 0–40)
Albumin: 4.7 g/dL (ref 3.5–5.5)
Alkaline Phosphatase: 95 IU/L (ref 39–117)
BILIRUBIN TOTAL: 1.2 mg/dL (ref 0.0–1.2)
BUN/Creatinine Ratio: 6 — ABNORMAL LOW (ref 9–20)
BUN: 8 mg/dL (ref 6–24)
CALCIUM: 11.3 mg/dL — AB (ref 8.7–10.2)
CHLORIDE: 105 mmol/L (ref 96–106)
CO2: 22 mmol/L (ref 20–29)
Creatinine, Ser: 1.27 mg/dL (ref 0.76–1.27)
GFR calc non Af Amer: 70 mL/min/{1.73_m2} (ref >59)
GFR, EST AFRICAN AMERICAN: 81 mL/min/{1.73_m2} (ref >59)
GLUCOSE: 87 mg/dL (ref 65–99)
Globulin, Total: 2.6 g/dL (ref 1.5–4.5)
POTASSIUM: 5 mmol/L (ref 3.5–5.2)
Sodium: 140 mmol/L (ref 134–144)
TOTAL PROTEIN: 7.3 g/dL (ref 6.0–8.5)

## 2017-08-07 LAB — VITAMIN D 25 HYDROXY (VIT D DEFICIENCY, FRACTURES): Vit D, 25-Hydroxy: 17.8 ng/mL — ABNORMAL LOW (ref 30.0–100.0)

## 2017-08-07 NOTE — Telephone Encounter (Signed)
Dean LandAngela, associate of [facility], called requesting insurance coverage information for patient since pt has been referred to their office from this center for care services; all questions answered

## 2017-08-08 ENCOUNTER — Inpatient Hospital Stay (HOSPITAL_COMMUNITY)
Admission: EM | Admit: 2017-08-08 | Discharge: 2017-08-11 | DRG: 812 | Disposition: A | Discharge status: Home / Self Care | Payer: Medicaid Other | Attending: Internal Medicine | Admitting: Internal Medicine

## 2017-08-08 ENCOUNTER — Emergency Department (HOSPITAL_COMMUNITY): Payer: Medicaid Other

## 2017-08-08 ENCOUNTER — Encounter (HOSPITAL_COMMUNITY): Payer: Self-pay | Admitting: Emergency Medicine

## 2017-08-08 ENCOUNTER — Other Ambulatory Visit: Payer: Self-pay

## 2017-08-08 ENCOUNTER — Other Ambulatory Visit: Payer: Self-pay | Admitting: Family Medicine

## 2017-08-08 DIAGNOSIS — Z9049 Acquired absence of other specified parts of digestive tract: Secondary | ICD-10-CM

## 2017-08-08 DIAGNOSIS — G4733 Obstructive sleep apnea (adult) (pediatric): Secondary | ICD-10-CM | POA: Diagnosis present

## 2017-08-08 DIAGNOSIS — D5700 Hb-SS disease with crisis, unspecified: Principal | ICD-10-CM | POA: Diagnosis present

## 2017-08-08 DIAGNOSIS — Z833 Family history of diabetes mellitus: Secondary | ICD-10-CM | POA: Diagnosis not present

## 2017-08-08 DIAGNOSIS — D57211 Sickle-cell/Hb-C disease with acute chest syndrome: Secondary | ICD-10-CM | POA: Diagnosis not present

## 2017-08-08 DIAGNOSIS — B9681 Helicobacter pylori [H. pylori] as the cause of diseases classified elsewhere: Secondary | ICD-10-CM

## 2017-08-08 DIAGNOSIS — K219 Gastro-esophageal reflux disease without esophagitis: Secondary | ICD-10-CM | POA: Diagnosis present

## 2017-08-08 DIAGNOSIS — I10 Essential (primary) hypertension: Secondary | ICD-10-CM | POA: Diagnosis not present

## 2017-08-08 DIAGNOSIS — Z832 Family history of diseases of the blood and blood-forming organs and certain disorders involving the immune mechanism: Secondary | ICD-10-CM

## 2017-08-08 DIAGNOSIS — Z8249 Family history of ischemic heart disease and other diseases of the circulatory system: Secondary | ICD-10-CM

## 2017-08-08 DIAGNOSIS — D5701 Hb-SS disease with acute chest syndrome: Secondary | ICD-10-CM | POA: Diagnosis present

## 2017-08-08 DIAGNOSIS — J45909 Unspecified asthma, uncomplicated: Secondary | ICD-10-CM | POA: Diagnosis present

## 2017-08-08 DIAGNOSIS — F1410 Cocaine abuse, uncomplicated: Secondary | ICD-10-CM | POA: Diagnosis not present

## 2017-08-08 DIAGNOSIS — Z862 Personal history of diseases of the blood and blood-forming organs and certain disorders involving the immune mechanism: Secondary | ICD-10-CM

## 2017-08-08 DIAGNOSIS — K2970 Gastritis, unspecified, without bleeding: Principal | ICD-10-CM

## 2017-08-08 DIAGNOSIS — K2980 Duodenitis without bleeding: Secondary | ICD-10-CM | POA: Diagnosis present

## 2017-08-08 DIAGNOSIS — F17210 Nicotine dependence, cigarettes, uncomplicated: Secondary | ICD-10-CM | POA: Diagnosis present

## 2017-08-08 DIAGNOSIS — R0602 Shortness of breath: Secondary | ICD-10-CM

## 2017-08-08 DIAGNOSIS — Z825 Family history of asthma and other chronic lower respiratory diseases: Secondary | ICD-10-CM | POA: Diagnosis not present

## 2017-08-08 LAB — 737588 9+OXYCODONE+CRT-UNBUND
Amphetamine Scrn, Ur: NEGATIVE ng/mL
BARBITURATE SCREEN URINE: NEGATIVE ng/mL
BENZODIAZEPINE SCREEN, URINE: NEGATIVE ng/mL
CANNABINOIDS UR QL SCN: NEGATIVE ng/mL
CREATININE(CRT), U: 213.7 mg/dL (ref 20.0–300.0)
Methadone Screen, Urine: NEGATIVE ng/mL
OXYCODONE+OXYMORPHONE UR QL SCN: NEGATIVE ng/mL
Opiate Scrn, Ur: NEGATIVE ng/mL
Ph of Urine: 5.6 (ref 4.5–8.9)
Phencyclidine Qn, Ur: NEGATIVE ng/mL
Propoxyphene Scrn, Ur: NEGATIVE ng/mL

## 2017-08-08 LAB — D-DIMER, QUANTITATIVE: D-Dimer, Quant: 0.67 ug/mL-FEU — ABNORMAL HIGH (ref 0.00–0.50)

## 2017-08-08 LAB — CBC
HCT: 36.8 % — ABNORMAL LOW (ref 39.0–52.0)
Hemoglobin: 13.6 g/dL (ref 13.0–17.0)
MCH: 29.8 pg (ref 26.0–34.0)
MCHC: 37 g/dL — ABNORMAL HIGH (ref 30.0–36.0)
MCV: 80.7 fL (ref 78.0–100.0)
PLATELETS: 284 10*3/uL (ref 150–400)
RBC: 4.56 MIL/uL (ref 4.22–5.81)
RDW: 16.5 % — AB (ref 11.5–15.5)
WBC: 13.6 10*3/uL — ABNORMAL HIGH (ref 4.0–10.5)

## 2017-08-08 LAB — BASIC METABOLIC PANEL
Anion gap: 6 (ref 5–15)
BUN: 7 mg/dL (ref 6–20)
CALCIUM: 10.5 mg/dL — AB (ref 8.9–10.3)
CO2: 27 mmol/L (ref 22–32)
CREATININE: 1.12 mg/dL (ref 0.61–1.24)
Chloride: 105 mmol/L (ref 98–111)
GFR calc non Af Amer: >60 mL/min (ref >60)
GLUCOSE: 95 mg/dL (ref 70–99)
Potassium: 3.8 mmol/L (ref 3.5–5.1)
Sodium: 138 mmol/L (ref 135–145)

## 2017-08-08 LAB — RETICULOCYTES
RBC.: 4.43 MIL/uL (ref 4.22–5.81)
Retic Count, Absolute: 199.4 10*3/uL — ABNORMAL HIGH (ref 19.0–186.0)
Retic Ct Pct: 4.5 % — ABNORMAL HIGH (ref 0.4–3.1)

## 2017-08-08 LAB — I-STAT TROPONIN, ED: TROPONIN I, POC: 0 ng/mL (ref 0.00–0.08)

## 2017-08-08 LAB — COCAINE (GC/MS), URINE
BENZOYLECGONINE (GC/MS): >5000 ng/mL
COCAINE + METABOLITE: POSITIVE — AB

## 2017-08-08 LAB — TROPONIN I: Troponin I: <0.03 ng/mL (ref <0.03)

## 2017-08-08 LAB — I-STAT CG4 LACTIC ACID, ED: Lactic Acid, Venous: 1.34 mmol/L (ref 0.5–1.9)

## 2017-08-08 MED ORDER — HYDROMORPHONE 1 MG/ML IV SOLN
INTRAVENOUS | Status: DC
  Administered 2017-08-08: 23:00:00 via INTRAVENOUS
  Administered 2017-08-09: 1.6 mg via INTRAVENOUS
  Administered 2017-08-09: 2.8 mg via INTRAVENOUS
  Administered 2017-08-09: 1.6 mg via INTRAVENOUS
  Administered 2017-08-09: 3.2 mg via INTRAVENOUS
  Administered 2017-08-09: 0.8 mg via INTRAVENOUS
  Administered 2017-08-09 – 2017-08-10 (×2): 0 mg via INTRAVENOUS
  Filled 2017-08-08: qty 25

## 2017-08-08 MED ORDER — NALOXONE HCL 0.4 MG/ML IJ SOLN: 0.4000 mg | INTRAMUSCULAR | Status: DC | PRN

## 2017-08-08 MED ORDER — CEFEPIME HCL 1 G IJ SOLR
1.0000 g | Freq: Once | INTRAMUSCULAR | Status: AC
  Administered 2017-08-08: 1 g via INTRAVENOUS
  Filled 2017-08-08: qty 1

## 2017-08-08 MED ORDER — HYDROMORPHONE HCL 1 MG/ML IJ SOLN: 1.0000 mg | Freq: Once | INTRAMUSCULAR | Status: DC

## 2017-08-08 MED ORDER — ALBUTEROL SULFATE (2.5 MG/3ML) 0.083% IN NEBU: 3.0000 mL | INHALATION_SOLUTION | Freq: Four times a day (QID) | RESPIRATORY_TRACT | Status: DC | PRN

## 2017-08-08 MED ORDER — SODIUM CHLORIDE 0.45 % IV SOLN
INTRAVENOUS | Status: DC
  Administered 2017-08-08 – 2017-08-11 (×4): via INTRAVENOUS

## 2017-08-08 MED ORDER — AMOXICILLIN 500 MG PO CAPS: 1000.0000 mg | ORAL_CAPSULE | Freq: Two times a day (BID) | ORAL | 0 refills | Status: DC

## 2017-08-08 MED ORDER — LORATADINE 10 MG PO TABS: 10.0000 mg | ORAL_TABLET | Freq: Every day | ORAL | Status: DC | PRN

## 2017-08-08 MED ORDER — FOLIC ACID 1 MG PO TABS
1.0000 mg | ORAL_TABLET | Freq: Every day | ORAL | Status: DC
  Administered 2017-08-09 – 2017-08-11 (×3): 1 mg via ORAL
  Filled 2017-08-08 (×3): qty 1

## 2017-08-08 MED ORDER — CLARITHROMYCIN 500 MG PO TABS: 500.0000 mg | ORAL_TABLET | Freq: Two times a day (BID) | ORAL | 0 refills | Status: DC

## 2017-08-08 MED ORDER — VANCOMYCIN HCL IN DEXTROSE 1-5 GM/200ML-% IV SOLN
1000.0000 mg | Freq: Once | INTRAVENOUS | Status: AC
  Administered 2017-08-08: 1000 mg via INTRAVENOUS
  Filled 2017-08-08: qty 200

## 2017-08-08 MED ORDER — DIPHENHYDRAMINE HCL 25 MG PO CAPS: 25.0000 mg | ORAL_CAPSULE | ORAL | Status: DC | PRN

## 2017-08-08 MED ORDER — SODIUM CHLORIDE 0.9 % IV SOLN
1.0000 g | Freq: Three times a day (TID) | INTRAVENOUS | Status: DC
  Administered 2017-08-09: 1 g via INTRAVENOUS
  Filled 2017-08-08 (×3): qty 1

## 2017-08-08 MED ORDER — OMEPRAZOLE 20 MG PO CPDR
20.0000 mg | DELAYED_RELEASE_CAPSULE | Freq: Two times a day (BID) | ORAL | 3 refills | Status: DC
Start: 2017-08-08 — End: 2017-09-17

## 2017-08-08 MED ORDER — KETOROLAC TROMETHAMINE 30 MG/ML IJ SOLN
30.0000 mg | Freq: Four times a day (QID) | INTRAMUSCULAR | Status: DC
  Administered 2017-08-09 – 2017-08-11 (×9): 30 mg via INTRAVENOUS
  Filled 2017-08-08 (×9): qty 1

## 2017-08-08 MED ORDER — VANCOMYCIN HCL IN DEXTROSE 1-5 GM/200ML-% IV SOLN
1000.0000 mg | Freq: Two times a day (BID) | INTRAVENOUS | Status: DC
  Administered 2017-08-09: 1000 mg via INTRAVENOUS
  Filled 2017-08-08: qty 200

## 2017-08-08 MED ORDER — MORPHINE SULFATE ER 30 MG PO TBCR
60.0000 mg | EXTENDED_RELEASE_TABLET | Freq: Two times a day (BID) | ORAL | Status: DC
  Administered 2017-08-08 – 2017-08-11 (×6): 60 mg via ORAL
  Filled 2017-08-08 (×6): qty 2

## 2017-08-08 MED ORDER — SODIUM CHLORIDE 0.9% FLUSH: 9.0000 mL | INTRAVENOUS | Status: DC | PRN

## 2017-08-08 MED ORDER — HYDROMORPHONE HCL 2 MG/ML IJ SOLN
2.0000 mg | Freq: Once | INTRAMUSCULAR | Status: AC
  Administered 2017-08-08: 2 mg via INTRAVENOUS
  Filled 2017-08-08: qty 1

## 2017-08-08 MED ORDER — PANTOPRAZOLE SODIUM 40 MG PO TBEC
40.0000 mg | DELAYED_RELEASE_TABLET | Freq: Two times a day (BID) | ORAL | Status: DC
  Administered 2017-08-08 – 2017-08-09 (×3): 40 mg via ORAL
  Filled 2017-08-08 (×3): qty 1

## 2017-08-08 MED ORDER — PANTOPRAZOLE SODIUM 40 MG PO TBEC: 40.0000 mg | DELAYED_RELEASE_TABLET | Freq: Every day | ORAL | Status: DC

## 2017-08-08 MED ORDER — ENOXAPARIN SODIUM 40 MG/0.4ML ~~LOC~~ SOLN
40.0000 mg | SUBCUTANEOUS | Status: DC
  Administered 2017-08-08 – 2017-08-10 (×3): 40 mg via SUBCUTANEOUS
  Filled 2017-08-08 (×3): qty 0.4

## 2017-08-08 MED ORDER — L-GLUTAMINE ORAL POWDER
10.0000 g | PACK | Freq: Two times a day (BID) | ORAL | Status: DC
  Administered 2017-08-09 – 2017-08-11 (×4): 10 g via ORAL
  Filled 2017-08-08 (×6): qty 2

## 2017-08-08 MED ORDER — ONDANSETRON HCL 4 MG/2ML IJ SOLN
4.0000 mg | Freq: Four times a day (QID) | INTRAMUSCULAR | Status: DC | PRN
  Administered 2017-08-08 – 2017-08-09 (×3): 4 mg via INTRAVENOUS
  Filled 2017-08-08 (×3): qty 2

## 2017-08-08 MED ORDER — SODIUM CHLORIDE 0.9 % IV SOLN
25.0000 mg | INTRAVENOUS | Status: DC | PRN
  Filled 2017-08-08: qty 0.5

## 2017-08-08 MED ORDER — SODIUM CHLORIDE 0.9 % IV BOLUS
1000.0000 mL | Freq: Once | INTRAVENOUS | Status: AC
  Administered 2017-08-08: 1000 mL via INTRAVENOUS

## 2017-08-08 MED ORDER — AMLODIPINE BESYLATE 5 MG PO TABS
10.0000 mg | ORAL_TABLET | Freq: Every day | ORAL | Status: DC
  Administered 2017-08-10 – 2017-08-11 (×2): 10 mg via ORAL
  Filled 2017-08-08 (×3): qty 2

## 2017-08-08 NOTE — Progress Notes (Signed)
A 42 year old male with a history of sickle cell anemia, hemoglobin [place] establish care on 08/03/2017.  Patient was complaining of pain to mid epigastric area.  Patient underwent testing for H. pylori, which is positive.  Will start triple therapy which includes the following:  Meds ordered this encounter  Medications  . omeprazole (PRILOSEC) 20 MG capsule    Sig: Take 1 capsule (20 mg total) by mouth 2 (two) times daily before a meal.    Dispense:  28 capsule    Refill:  3  . clarithromycin (BIAXIN) 500 MG tablet    Sig: Take 1 tablet (500 mg total) by mouth 2 (two) times daily for 14 days.    Dispense:  28 tablet    Refill:  0  . amoxicillin (AMOXIL) 500 MG capsule    Sig: Take 2 capsules (1,000 mg total) by mouth 2 (two) times daily for 14 days.    Dispense:  56 capsule    Refill:  0   Attempted to reach patient by phone, no answer.  Left message.  Nolon NationsLachina Moore Amand Lemoine  MSN, FNP-C Patient Care [facility] [address]  [place], [place] [id number] [phone]

## 2017-08-08 NOTE — Progress Notes (Signed)
A consult was received from an ED physician for vancomycin per pharmacy dosing.  The patient's profile has been reviewed for ht/wt/allergies/indication/available labs.    A one time order has been placed for vancomycin 1000 mg IV.  Further antibiotics/pharmacy consults should be ordered by admitting physician if indicated.                       Thank you, Jodelle RedMary M Hammond [facility] 08/08/2017  7:21 PM

## 2017-08-08 NOTE — Progress Notes (Signed)
Pharmacy Antibiotic Note  Dean Webb is a 42 y.o. male admitted on 08/08/2017 with pneumonia.  Pharmacy has been consulted for vancomycin dosing.  Patient presents to ED with SOB, chest pain, sickle cell pain crisis. Broad spectrum antibiotics (vancomycin + cefepime) started on admission.  Today, 08/08/17  WBC 13.6  Afebrile  Recommend checking MRSA PCR.  Plan:  Vancomycin 1000 mg IV once given in ED.   Will order vancomycin 1000 mg IV q12h maintenance dose  Goal AUC 400-500  Monitor renal function  Monitor vancomycin levels once at steady state     Temp (24hrs), Avg:99.6 F (37.6 C), Min:99.6 F (37.6 C), Max:99.6 F (37.6 C)  Recent Labs  Lab 08/03/17 0941 08/05/17 2127 08/08/17 1814 08/08/17 1957  WBC 8.9 9.9 13.6*  --   CREATININE 1.27 1.19 1.12  --   LATICACIDVEN  --   --   --  1.34    Estimated Creatinine Clearance: 89 mL/min (by C-G formula based on SCr of 1.12 mg/dL).    No Known Allergies  Antimicrobials this admission: cefepime 6/26 >>  vancomycin 6/26 >>   Dose adjustments this admission:  Microbiology results: 6/26 BCx: Sent 6/26 Sputum: Sent   Thank you for allowing pharmacy to be a part of this patient's care.  Cindi CarbonMary M Charon Smedberg, PharmD, BCPS Clinical Pharmacist 08/08/2017 9:28 PM

## 2017-08-08 NOTE — ED Notes (Signed)
ED TO INPATIENT HANDOFF REPORT  Name/Age/Gender Dean Webb 42 y.o. male  Code Status    Code Status Orders  (From admission, onward)        Start     Ordered   08/08/17 2116  Full code  Continuous     08/08/17 2118    Code Status History    Date Active Date Inactive Code Status Order ID Comments User Context   07/11/2017 0955 07/13/2017 1554 Full Code [id number]  Elwyn Reach, MD Inpatient   04/30/2017 0020 05/04/2017 1544 Full Code [id number]  Rise Patience, MD Inpatient   04/22/2017 1518 04/26/2017 1454 Full Code [id number]  Leana Gamer, MD Inpatient   03/08/2016 1801 03/11/2016 1734 Full Code [id number]  Leana Gamer, MD Inpatient      Home/SNF/Other Home  Chief Complaint chets pain / neck pain   Level of Care/Admitting Diagnosis ED Disposition    ED Disposition Condition [facility] Area: [place] [[id number]]  Level of Care: Telemetry [5]  Admit to tele based on following criteria: Other see comments  Comments: Acute chest syndrome  Diagnosis: 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 Asthma   . GERD (gastroesophageal reflux disease)   . Hb-S/Hb-C disease ([place]) 1979  . HTN (hypertension) 2008  . Hypertension   . Sickle cell anemia (HCC)   . Sickle cell disease ([place])     Allergies No Known Allergies  IV Location/Drains/Wounds Patient Lines/Drains/Airways Status   Active Line/Drains/Airways    Name:   Placement date:   Placement time:   Site:   Days:   Peripheral IV  08/08/17 Right Antecubital   08/08/17    1941    Antecubital   less than 1   Peripheral IV 08/08/17 Left Antecubital   08/08/17    1942    Antecubital   less than 1          Labs/Imaging Results for orders placed or performed during the hospital encounter of 08/08/17 (from the past 48 hour(s))  Basic metabolic panel     Status: Abnormal   Collection Time: 08/08/17  6:14 PM  Result Value Ref Range   Sodium 138 135 - 145 mmol/L   Potassium 3.8 3.5 - 5.1 mmol/L   Chloride 105 98 - 111 mmol/L    Comment: Please note change in reference range.   CO2 27 22 - 32 mmol/L   Glucose, Bld 95 70 - 99 mg/dL    Comment: Please note change in reference range.   BUN 7 6 - 20 mg/dL    Comment: Please note change in reference range.   Creatinine, Ser 1.12 0.61 - 1.24 mg/dL   Calcium 10.5 (H) 8.9 - 10.3 mg/dL   GFR calc non Af Amer >60 >60 mL/min   GFR calc Af Amer >60 >60 mL/min    Comment: (NOTE) The eGFR has been calculated using the CKD EPI equation. This calculation has not been validated in all clinical situations. eGFR's persistently <60 mL/min signify possible Chronic  Kidney Disease.    Anion gap 6 5 - 15    Comment: Performed at [facility], [place] [address], [place], [place] [address]  CBC     Status: Abnormal   Collection Time: 08/08/17  6:14 PM  Result Value Ref Range   WBC 13.6 (H) 4.0 - 10.5 K/uL   RBC 4.56 4.22 - 5.81 MIL/uL   Hemoglobin 13.6 13.0 - 17.0 g/dL   HCT 36.8 (L) 39.0 - 52.0 %   MCV 80.7 78.0 - 100.0 fL   MCH 29.8 26.0 - 34.0 pg   MCHC 37.0 (H) 30.0 - 36.0 g/dL   RDW 16.5 (H) 11.5 - 15.5 %   Platelets 284 150 - 400 K/uL    Comment: Performed at [facility], [place] [address] [place], [place] [address]  I-stat troponin, ED     Status: None   Collection Time: 08/08/17  6:21 PM  Result Value Ref Range   Troponin i, poc 0.00 0.00 - 0.08 ng/mL   Comment 3            Comment: Due to the release kinetics of cTnI, a negative result  within the first hours of the onset of symptoms does not rule out myocardial infarction with certainty. If myocardial infarction is still suspected, repeat the test at appropriate intervals.   D-dimer, quantitative     Status: Abnormal   Collection Time: 08/08/17  7:26 PM  Result Value Ref Range   D-Dimer, Quant 0.67 (H) 0.00 - 0.50 ug/mL-FEU    Comment: (NOTE) At the manufacturer cut-off of 0.50 ug/mL FEU, this assay has been documented to exclude PE with a sensitivity and negative predictive value of 97 to 99%.  At this time, this assay has not been approved by the FDA to exclude DVT/VTE. Results should be correlated with clinical presentation. Performed at [facility], [place] [address], [place], [place] [address]   Reticulocytes     Status: Abnormal   Collection Time: 08/08/17  7:26 PM  Result Value Ref Range   Retic Ct Pct 4.5 (H) 0.4 - 3.1 %   RBC. 4.43 4.22 - 5.81 MIL/uL   Retic Count, Absolute 199.4 (H) 19.0 - 186.0 K/uL    Comment: Performed at [facility], [place] [address], [place], [place] [address]  I-Stat CG4 Lactic Acid, ED     Status: None   Collection Time: 08/08/17  7:57 PM  Result Value Ref Range   Lactic Acid, Venous 1.34 0.5 - 1.9 mmol/L   Dg Chest 2 View  Result Date: 08/08/2017 CLINICAL DATA:  Chest pain EXAM: CHEST - 2 VIEW COMPARISON:  05/03/2017 chest radiograph. FINDINGS: Stable cardiomediastinal silhouette with normal heart size. No pneumothorax. No pleural effusion. Mild patchy opacity at the left costophrenic angle. No pulmonary edema. IMPRESSION: Mild patchy opacity at the left costophrenic angle, which could represent acute chest syndrome, pneumonia or atelectasis. Recommend follow-up PA and lateral post treatment chest radiographs in 4-6 weeks. Electronically Signed   By: Ilona Sorrel M.D.   On: 08/08/2017 18:22    Pending Labs Unresulted Labs (From admission, onward)   Start     Ordered   08/09/17 0500  CBC   Tomorrow morning,   R     08/08/17 2118   08/09/17 2956  Basic metabolic panel  Tomorrow morning,   R     08/08/17 2118   08/08/17 2117  Troponin I (q 6hr x 3)  Now then  every 6 hours,   R     08/08/17 2118   08/08/17 2115  Culture, sputum-assessment  Once,   R     08/08/17 2118   08/08/17 2115  Gram stain  Once,   R     08/08/17 2118   08/08/17 2115  HIV antibody (Routine Screening)  Once,   R     08/08/17 2118   08/08/17 2115  Strep pneumoniae urinary antigen  Once,   R     08/08/17 2118   08/08/17 1840  Culture, blood (routine x 2)  BLOOD CULTURE X 2,   STAT     08/08/17 1839      Vitals/Pain Today's Vitals   08/08/17 2000 08/08/17 2030 08/08/17 2100 08/08/17 2130  BP: 131/87 (!) 138/96 115/86 121/87  Pulse: 92 87 81 92  Resp: _0 Temp:      TempSrc:      SpO2: 95% 96% 96% 97%    Isolation Precautions No active isolations  Medications Medications  folic acid (FOLVITE) tablet 1 mg (has no administration in time range)  albuterol (PROVENTIL HFA;VENTOLIN HFA) 108 (90 Base) MCG/ACT inhaler 2 puff (has no administration in time range)  loratadine (CLARITIN) tablet 10 mg (has no administration in time range)  amLODipine (NORVASC) tablet 10 mg (has no administration in time range)  L-glutamine (ENDARI) Powder Packet 10 g (has no administration in time range)  morphine (MS CONTIN) 12 hr tablet 60 mg (has no administration in time range)  enoxaparin (LOVENOX) injection 40 mg (has no administration in time range)  ketorolac (TORADOL) 30 MG/ML injection 30 mg (has no administration in time range)  ceFEPIme (MAXIPIME) 1 g in sodium chloride 0.9 % 100 mL IVPB (has no administration in time range)  0.45 % sodium chloride infusion (has no administration in time range)  naloxone (NARCAN) injection 0.4 mg (has no administration in time range)    And  sodium chloride flush (NS) 0.9 % injection 9 mL (has no administration in time range)  ondansetron (ZOFRAN) injection 4 mg  (has no administration in time range)  diphenhydrAMINE (BENADRYL) capsule 25 mg (has no administration in time range)    Or  diphenhydrAMINE (BENADRYL) 25 mg in sodium chloride 0.9 % 50 mL IVPB (has no administration in time range)  HYDROmorphone (DILAUDID) 1 mg/mL PCA injection (has no administration in time range)  pantoprazole (PROTONIX) EC tablet 40 mg (has no administration in time range)  vancomycin (VANCOCIN) IVPB 1000 mg/200 mL premix (has no administration in time range)  sodium chloride 0.9 % bolus 1,000 mL (0 mLs Intravenous Stopped 08/08/17 2051)  HYDROmorphone (DILAUDID) injection 2 mg (2 mg Intravenous Given 08/08/17 1945)  ceFEPIme (MAXIPIME) 1 g in sodium chloride 0.9 % 100 mL IVPB (0 g Intravenous Stopped 08/08/17 2020)  vancomycin (VANCOCIN) IVPB 1000 mg/200 mL premix (0 mg Intravenous Stopped 08/08/17 2100)    Mobility walks

## 2017-08-08 NOTE — ED Triage Notes (Signed)
Pt from home with c/o left sided chest pain that began yesterday that has gotten worse and has become SOB today. Pt states this is not typical of SSC pain. Pt states any movement and sitting still makes pain worse. Pt is able to maintain oxygen saturation above 95% RA

## 2017-08-08 NOTE — H&P (Signed)
History and Physical    Dean Webb UJW:[id number] DOB: [birth year] DOA: 08/08/2017  PCP: Massie MaroonHollis, Lachina M, FNP  Patient coming from: Home  I have personally briefly reviewed patient's [facility] medical [facility]  Chief Complaint: CP, SOB  HPI: Dean Webb is a 42 y.o. male with medical history significant of HGB-[place] disease, HTN, cocaine abuse.  Patient presents to the ED with c/o L sided CP and SOB.  Onset yesterday as a 2/10, now an "11/10".  Associated SOB.  Does admit to cocaine use yesterday morning, none today.  CP worse with exertion, different than typical sickle cell pain.   ED Course: T 99.6, WBC 13.6.  HGB 13.6 (about his baseline).  CXR shows LLL infiltrate   Review of Systems: As per HPI otherwise 10 point review of systems negative.   Past Medical History:  Diagnosis Date  . Asthma   . GERD (gastroesophageal reflux disease)   . Hb-S/Hb-C disease (HCC) 1979  . HTN (hypertension) 2008  . Hypertension   . Sickle cell anemia (HCC)   . Sickle cell disease (HCC)     Past Surgical History:  Procedure Laterality Date  . CHOLECYSTECTOMY       reports that he has been smoking.  He has a 8.50 pack-year smoking history. He has never used smokeless tobacco. He reports that he drinks alcohol. He reports that he has current or past drug history. Drug: Marijuana.  No Known Allergies  Family History  Problem Relation Age of Onset  . Sickle cell trait Mother   . Hypertension Mother   . Asthma Mother   . Sickle cell trait Father   . Congestive Heart Failure Father   . Hypertension Sister   . Sickle cell anemia Daughter   . Hypertension Daughter   . Diabetes Daughter      Prior to Admission medications   Medication Sig Start Date End Date Taking? Authorizing Provider  albuterol (PROVENTIL HFA;VENTOLIN HFA) 108 (90 Base) MCG/ACT inhaler Inhale 2 puffs into the lungs every 6 (six) hours as needed for wheezing or shortness of breath. 07/13/17  Yes  Jegede, Olugbemiga E, MD  amLODipine (NORVASC) 10 MG tablet Take 1 tablet (10 mg total) by mouth daily. 08/03/17  Yes Massie MaroonHollis, Lachina M, FNP  cetirizine (ZYRTEC) 10 MG tablet Take 1 tablet (10 mg total) by mouth daily as needed for allergies. 04/23/17  Yes Altha HarmMatthews, Michelle A, MD  folic acid (FOLVITE) 1 MG tablet Take 1 tablet (1 mg total) by mouth daily. 08/03/17  Yes Massie MaroonHollis, Lachina M, FNP  ibuprofen (ADVIL,MOTRIN) 800 MG tablet Take 1 tablet (800 mg total) by mouth every 8 (eight) hours as needed. Patient taking differently: Take 800 mg by mouth every 8 (eight) hours as needed for headache, mild pain or moderate pain.  04/23/17  Yes Altha HarmMatthews, Michelle A, MD  L-glutamine (ENDARI) 5 g PACK Powder Packet Take 10 g by mouth 2 (two) times daily. 08/03/17  Yes Massie MaroonHollis, Lachina M, FNP  morphine (MS CONTIN) 60 MG 12 hr tablet Take 1 tablet (60 mg total) by mouth every 12 (twelve) hours for 15 days. 08/03/17 08/18/17 Yes Massie MaroonHollis, Lachina M, FNP  omeprazole (PRILOSEC) 20 MG capsule Take 1 capsule (20 mg total) by mouth 2 (two) times daily before a meal. 08/08/17  Yes Massie MaroonHollis, Lachina M, FNP  oxycodone (ROXICODONE) 15 MG immediate release tablet Take 1 tablet (15 mg total) by mouth every 6 (six) hours as needed for up to 15 days  for pain. 08/03/17 08/18/17 Yes Massie Maroon, FNP    Physical Exam: Vitals:   08/08/17 1900 08/08/17 1956 08/08/17 2000 08/08/17 2030  BP: 127/89 137/89 131/87 (!) 138/96  Pulse: 99 97 92 87  Resp: (!) 30 (!) 22 19 17   Temp:      TempSrc:      SpO2: 99% 96% 95% 96%    Constitutional: NAD, calm, uncomfortable Eyes: PERRL, lids and conjunctivae normal ENMT: Mucous membranes are moist. Posterior pharynx clear of any exudate or lesions.Normal dentition.  Neck: normal, supple, no masses, no thyromegaly Respiratory: Rales on left Cardiovascular: Tachycardic Abdomen: no tenderness, no masses palpated. No hepatosplenomegaly. Bowel sounds positive.  Musculoskeletal: no clubbing /  cyanosis. No joint deformity upper and lower extremities. Good ROM, no contractures. Normal muscle tone.  Skin: no rashes, lesions, ulcers. No induration Neurologic: CN 2-12 grossly intact. Sensation intact, DTR normal. Strength 5/5 in all 4.  Psychiatric: Normal judgment and insight. Alert and oriented x 3. Normal mood.    Labs on Admission: I have personally reviewed following labs and imaging studies  CBC: Recent Labs  Lab 08/03/17 0941 08/05/17 2127 08/08/17 1814  WBC 8.9 9.9 13.6*  NEUTROABS 5.2  --   --   HGB 14.2 14.8 13.6  HCT 42.7 39.8 36.8*  MCV 87 80.4 80.7  PLT 349 317 284   Basic Metabolic Panel: Recent Labs  Lab 08/03/17 0941 08/05/17 2127 08/08/17 1814  NA 140 139 138  K 5.0 4.3 3.8  CL 105 108 105  CO2 22 24 27   GLUCOSE 87 102* 95  BUN 8 9 7   CREATININE 1.27 1.19 1.12  CALCIUM 11.3* 11.4* 10.5*   GFR: Estimated Creatinine Clearance: 89 mL/min (by C-G formula based on SCr of 1.12 mg/dL). Liver Function Tests: Recent Labs  Lab 08/03/17 0941 08/05/17 2127  AST 17 21  ALT 11 17  ALKPHOS 95 92  BILITOT 1.2 2.1*  PROT 7.3 7.8  ALBUMIN 4.7 4.6   Recent Labs  Lab 08/05/17 2127  LIPASE 28   No results for input(s): AMMONIA in the last 168 hours. Coagulation Profile: No results for input(s): INR, PROTIME in the last 168 hours. Cardiac Enzymes: No results for input(s): CKTOTAL, CKMB, CKMBINDEX, TROPONINI in the last 168 hours. BNP (last 3 results) No results for input(s): PROBNP in the last 8760 hours. HbA1C: No results for input(s): HGBA1C in the last 72 hours. CBG: No results for input(s): GLUCAP in the last 168 hours. Lipid Profile: No results for input(s): CHOL, HDL, LDLCALC, TRIG, CHOLHDL, LDLDIRECT in the last 72 hours. Thyroid Function Tests: No results for input(s): TSH, T4TOTAL, FREET4, T3FREE, THYROIDAB in the last 72 hours. Anemia Panel: Recent Labs    08/08/17 1926  RETICCTPCT 4.5*   Urine analysis:    Component Value  Date/Time   COLORURINE YELLOW 04/30/2017 0122   APPEARANCEUR CLEAR 04/30/2017 0122   LABSPEC >1.046 (H) 04/30/2017 0122   PHURINE 5.0 04/30/2017 0122   GLUCOSEU NEGATIVE 04/30/2017 0122   HGBUR NEGATIVE 04/30/2017 0122   BILIRUBINUR neg 08/03/2017 1005   KETONESUR NEGATIVE 04/30/2017 0122   PROTEINUR Negative 08/03/2017 1005   PROTEINUR NEGATIVE 04/30/2017 0122   UROBILINOGEN 0.2 08/03/2017 1005   UROBILINOGEN 0.2 11/01/2012 1010   NITRITE neg 08/03/2017 1005   NITRITE NEGATIVE 04/30/2017 0122   LEUKOCYTESUR NEGATIVE 04/30/2017 0122    Radiological Exams on Admission: Dg Chest 2 View  Result Date: 08/08/2017 CLINICAL DATA:  Chest pain EXAM: CHEST - 2 VIEW  COMPARISON:  05/03/2017 chest radiograph. FINDINGS: Stable cardiomediastinal silhouette with normal heart size. No pneumothorax. No pleural effusion. Mild patchy opacity at the left costophrenic angle. No pulmonary edema. IMPRESSION: Mild patchy opacity at the left costophrenic angle, which could represent acute chest syndrome, pneumonia or atelectasis. Recommend follow-up PA and lateral post treatment chest radiographs in 4-6 weeks. Electronically Signed   By: Delbert Phenix M.D.   On: 08/08/2017 18:22    EKG: Independently reviewed.  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 pylori duodenitis    1. Acute chest syndrome - mild without desaturation 1. PNA pathway 2. Cefepime and vanc 3. IVF: half NS at 100 4. Scheduled toradol 5. Continue home MS contin 6. Dilaudid PCA 7. Tele monitor 8. Serial trops 9. HGB 13.6, no indication for transfusion 10. Cultures pending 2. Cocaine abuse - last use yesterday, needs to stop using cocaine, certainly isnt helping his sickle cell. 3. H. Pylori - 1. Continue PPI 2. But will hold off on starting the amoxicillin / clarithromycin for now since  we are treating HCAP (note that HCAP treatment likely wont treat the h.pylori) 3. To be done in future as outpt 4. HTN - continue norvasc  DVT prophylaxis: Lovenox Code Status: Full Family Communication: No family in room Disposition Plan: Home after admit Consults called: None Admission status: Admit to inpatient   Hillary Bow DO [facility] Pager [phone] Only works nights!  If 7AM-7PM, please contact the primary day team physician taking care of patient  [email or web address] Password [facility]  08/08/2017, 9:25 PM

## 2017-08-08 NOTE — ED Provider Notes (Signed)
[facility]-EMERGENCY DEPT Provider Note   CSN: [id number] Arrival date & time: 08/08/17  1711     History   Chief Complaint Chief Complaint  Patient presents with  . Chest Pain  . Shortness of Breath  . Sickle Cell Pain Crisis    HPI Dean Webb is a 42 y.o. male with a history of sickle cell anemia, acute chest syndrome, asthma, hypertension, cocaine abuse who presents emergency department today for chest pain shortness of breath.  Patient was yesterday he began having left-sided chest pain that radiated to his back and his upper chest that he rated as a 2/10.  He notes it has been constant since onset but increasing to a now 11/10.  He notes it is associate with shortness of breath.  He notes he does have a dry, nonproductive cough.  He notes that his symptoms are worse with exertion and his pain is worse as well with palpation.  His pain is not positional.  He denies history of the same.  He feels this is different than his typical sickle cell pain.  Patient does admit to cocaine use earlier yesterday morning.  None today.  He denies any fever, headache, visual changes, focal weakness, abdominal pain, nausea/vomiting/diarrhea, urinary symptoms, numbness/tingling of the extremities.  No trauma to the chest wall.  No recent travel, surgeries, immobilization.  Denies hemoptysis.  HPI  Past Medical History:  Diagnosis Date  . Asthma   . GERD (gastroesophageal reflux disease)   . Hb-S/Hb-C disease (HCC) 1979  . HTN (hypertension) 2008  . Hypertension   . Sickle cell anemia (HCC)   . Sickle cell disease [facility])     Patient Active Problem List   Diagnosis Date Noted  . Sickle cell anemia with crisis (HCC) 07/11/2017  . Pneumobilia   . Sickle cell pain crisis (HCC) 04/29/2017  . Acute chest syndrome (HCC) 04/24/2017  . CAP (community acquired pneumonia) 04/24/2017  . OSA (obstructive sleep apnea) 04/22/2017  . Mild asthma without complication 04/22/2017    . Essential hypertension 03/11/2016  . Chronic prescription opiate use 03/11/2016  . Precordial chest pain 03/08/2016  . Sickle cell disease (HCC)     Past Surgical History:  Procedure Laterality Date  . CHOLECYSTECTOMY          Home Medications    Prior to Admission medications   Medication Sig Start Date End Date Taking? Authorizing Provider  albuterol (PROVENTIL HFA;VENTOLIN HFA) 108 (90 Base) MCG/ACT inhaler Inhale 2 puffs into the lungs every 6 (six) hours as needed for wheezing or shortness of breath. 07/13/17   Quentin Angst, MD  amLODipine (NORVASC) 10 MG tablet Take 1 tablet (10 mg total) by mouth daily. 08/03/17   Massie Maroon, FNP  amoxicillin (AMOXIL) 500 MG capsule Take 2 capsules (1,000 mg total) by mouth 2 (two) times daily for 14 days. 08/08/17 08/22/17  Massie Maroon, FNP  cetirizine (ZYRTEC) 10 MG tablet Take 1 tablet (10 mg total) by mouth daily as needed for allergies. 04/23/17   Altha Harm, MD  clarithromycin (BIAXIN) 500 MG tablet Take 1 tablet (500 mg total) by mouth 2 (two) times daily for 14 days. 08/08/17 08/22/17  Massie Maroon, FNP  folic acid (FOLVITE) 1 MG tablet Take 1 tablet (1 mg total) by mouth daily. 08/03/17   Massie Maroon, FNP  ibuprofen (ADVIL,MOTRIN) 800 MG tablet Take 1 tablet (800 mg total) by mouth every 8 (eight) hours as needed. Patient taking  differently: Take 800 mg by mouth every 8 (eight) hours as needed for headache, mild pain or moderate pain.  04/23/17   Altha Harm, MD  L-glutamine (ENDARI) 5 g PACK Powder Packet Take 10 g by mouth 2 (two) times daily. 08/03/17   Massie Maroon, FNP  morphine (MS CONTIN) 60 MG 12 hr tablet Take 1 tablet (60 mg total) by mouth every 12 (twelve) hours for 15 days. 08/03/17 08/18/17  Massie Maroon, FNP  omeprazole (PRILOSEC) 20 MG capsule Take 1 capsule (20 mg total) by mouth 2 (two) times daily before a meal. 08/08/17   Massie Maroon, FNP  oxycodone (ROXICODONE)  15 MG immediate release tablet Take 1 tablet (15 mg total) by mouth every 6 (six) hours as needed for up to 15 days for pain. 08/03/17 08/18/17  Massie Maroon, FNP    Family History Family History  Problem Relation Age of Onset  . Sickle cell trait Mother   . Hypertension Mother   . Asthma Mother   . Sickle cell trait Father   . Congestive Heart Failure Father   . Hypertension Sister   . Sickle cell anemia Daughter   . Hypertension Daughter   . Diabetes Daughter     Social History Social History   Tobacco Use  . Smoking status: Current Every Day Smoker    Packs/day: 0.50    Years: 17.00    Pack years: 8.50  . Smokeless tobacco: Never Used  Substance Use Topics  . Alcohol use: Yes    Comment: Occasional drinker  . Drug use: Not Currently    Types: Marijuana     Allergies   Patient has no known allergies.   Review of Systems Review of Systems  All other systems reviewed and are negative.    Physical Exam Updated Vital Signs BP 134/84 (BP Location: Right Arm)   Pulse (!) 122   Temp 99.6 F (37.6 C) (Oral)   Resp (!) 22   SpO2 98%   Physical Exam  Constitutional: He appears well-developed and well-nourished.  HENT:  Head: Normocephalic and atraumatic.  Right Ear: External ear normal.  Left Ear: External ear normal.  Nose: Nose normal.  Mouth/Throat: Uvula is midline, oropharynx is clear and moist and mucous membranes are normal. No tonsillar exudate.  Eyes: Pupils are equal, round, and reactive to light. Right eye exhibits no discharge. Left eye exhibits no discharge. No scleral icterus.  Neck: Trachea normal. Neck supple. No spinous process tenderness present. No neck rigidity. Normal range of motion present.  Cardiovascular: Normal rate, regular rhythm and intact distal pulses.  No murmur heard. Pulses:      Radial pulses are 2+ on the right side, and 2+ on the left side.       Dorsalis pedis pulses are 2+ on the right side, and 2+ on the left side.         Posterior tibial pulses are 2+ on the right side, and 2+ on the left side.  No lower extremity swelling or edema. Calves symmetric in size bilaterally.  Pulmonary/Chest: Effort normal. He has rales in the left upper field. He exhibits tenderness.  Patient satting at 98% on room air with good waveform on monitor   Abdominal: Soft. Bowel sounds are normal. There is no tenderness. There is no rebound and no guarding.  Musculoskeletal: He exhibits no edema.  Lymphadenopathy:    He has no cervical adenopathy.  Neurological: He is alert.  Speech clear. Follows  commands. No facial droop. PERRLA. EOMI. Normal peripheral fields. CN III-XII intact.  Grossly moves all extremities 4 without ataxia. Coordination intact. Able and appropriate strength for age to upper and lower extremities bilaterally including grip strength & plantar flexion/dorsiflexion. Sensation to light touch intact bilaterally for upper and lower. Patellar deep tendon reflex 2+ and equal bilaterally. Normal finger to nose. No pronator drift.   Skin: Skin is warm and dry. Capillary refill takes less than 2 seconds. No rash noted. He is not diaphoretic.  No vesicular-like rash on the patient's chest wall  Psychiatric: He has a normal mood and affect.  Nursing note and vitals reviewed.    ED Treatments / Results  Labs (all labs ordered are listed, but only abnormal results are displayed) Labs Reviewed  CULTURE, BLOOD (ROUTINE X 2)  CULTURE, BLOOD (ROUTINE X 2)  BASIC METABOLIC PANEL  CBC  D-DIMER, QUANTITATIVE (NOT AT [facility])  RETICULOCYTES  I-STAT TROPONIN, ED    EKG None  Radiology Dg Chest 2 View  Result Date: 08/08/2017 CLINICAL DATA:  Chest pain EXAM: CHEST - 2 VIEW COMPARISON:  05/03/2017 chest radiograph. FINDINGS: Stable cardiomediastinal silhouette with normal heart size. No pneumothorax. No pleural effusion. Mild patchy opacity at the left costophrenic angle. No pulmonary edema. IMPRESSION: Mild patchy  opacity at the left costophrenic angle, which could represent acute chest syndrome, pneumonia or atelectasis. Recommend follow-up PA and lateral post treatment chest radiographs in 4-6 weeks. Electronically Signed   By: Delbert Phenix M.D.   On: 08/08/2017 18:22    Procedures Procedures (including critical care time)  CRITICAL CARE Performed by: Jacinto Halim   Total critical care time: 45 minutes - acute chest syndrome  Critical care time was exclusive of separately billable procedures and treating other patients.  Critical care was necessary to treat or prevent imminent or life-threatening deterioration.  Critical care was time spent personally by me on the following activities: development of treatment plan with patient and/or surrogate as well as nursing, discussions with consultants, evaluation of patient's response to treatment, examination of patient, obtaining history from patient or surrogate, ordering and performing treatments and interventions, ordering and review of laboratory studies, ordering and review of radiographic studies, pulse oximetry and re-evaluation of patient's condition.  Medications Ordered in ED Medications  sodium chloride 0.9 % bolus 1,000 mL (1,000 mLs Intravenous New Bag/Given 08/08/17 1945)  vancomycin (VANCOCIN) IVPB 1000 mg/200 mL premix (1,000 mg Intravenous New Bag/Given 08/08/17 1945)  HYDROmorphone (DILAUDID) injection 2 mg (2 mg Intravenous Given 08/08/17 1945)  ceFEPIme (MAXIPIME) 1 g in sodium chloride 0.9 % 100 mL IVPB (1 g Intravenous New Bag/Given 08/08/17 1946)     Initial Impression / Assessment and Plan / ED Course  I have reviewed the triage vital signs and the nursing notes.  Pertinent labs & imaging results that were available during my care of the patient were reviewed by me and considered in my medical decision making (see chart for details).     42 y.o. male history of sickle cell anemia who presents emergency department today for  chest pain and shortness of breath with associated cough.  He denies fever at home.  Patient is noted to be tachycardic at 122 as well as tachypneic at 22.  He is without fever, hypotension or hypoxia.  He is not requiring oxygen supplementation in the department.  There is concern for acute chest versus ACS versus PE.  Will order CBC, BMP, TSH, d-dimer, troponin, EKG, chest x-ray.  Will  give IV fluids and pain medication.  Presentation is concerning for acute chest.  Patient presenting with chest pain, shortness breath, cough.  He has an opacity of the left costophrenic angle on chest x-ray.  He does have a leukocytosis.  Patient has been recently admitted in the last 3 months.  He frequents the emergency department.  Will cover for HCAP.  Patient covered with vancomycin and cefepime.  Blood cultures pending.  Lactic acid is within normal limits.  D-dimer pending.  Labs otherwise reviewed and reassuring.  EKG was sinus tachycardia.  Appreciate Dr. Julian ReilGardner for admitting the patient and bring him to the hospitalist service.  Final Clinical Impressions(s) / ED Diagnoses   Final diagnoses:  Acute chest syndrome (HCC)  Shortness of breath  History of sickle cell anemia    ED Discharge Orders    None       Princella PellegriniMaczis, Georga Stys M, PA-C 08/08/17 2126    Benjiman CorePickering, Nathan, MD 08/08/17 2351

## 2017-08-09 DIAGNOSIS — D57211 Sickle-cell/Hb-C disease with acute chest syndrome: Secondary | ICD-10-CM

## 2017-08-09 LAB — BASIC METABOLIC PANEL
Anion gap: 2 — ABNORMAL LOW (ref 5–15)
BUN: 8 mg/dL (ref 6–20)
CALCIUM: 9.7 mg/dL (ref 8.9–10.3)
CHLORIDE: 104 mmol/L (ref 98–111)
CO2: 30 mmol/L (ref 22–32)
Creatinine, Ser: 1.06 mg/dL (ref 0.61–1.24)
GLUCOSE: 118 mg/dL — AB (ref 70–99)
POTASSIUM: 4.4 mmol/L (ref 3.5–5.1)
Sodium: 136 mmol/L (ref 135–145)

## 2017-08-09 LAB — PROCALCITONIN: Procalcitonin: <0.1 ng/mL

## 2017-08-09 LAB — CBC
HEMATOCRIT: 33 % — AB (ref 39.0–52.0)
HEMOGLOBIN: 12.1 g/dL — AB (ref 13.0–17.0)
MCH: 29.7 pg (ref 26.0–34.0)
MCHC: 36.7 g/dL — AB (ref 30.0–36.0)
MCV: 80.9 fL (ref 78.0–100.0)
Platelets: 253 10*3/uL (ref 150–400)
RBC: 4.08 MIL/uL — AB (ref 4.22–5.81)
RDW: 16.4 % — ABNORMAL HIGH (ref 11.5–15.5)
WBC: 16.4 10*3/uL — ABNORMAL HIGH (ref 4.0–10.5)

## 2017-08-09 LAB — STREP PNEUMONIAE URINARY ANTIGEN: Strep Pneumo Urinary Antigen: NEGATIVE

## 2017-08-09 LAB — TROPONIN I: Troponin I: <0.03 ng/mL (ref <0.03)

## 2017-08-09 MED ORDER — PROMETHAZINE HCL 25 MG/ML IJ SOLN
12.5000 mg | Freq: Once | INTRAMUSCULAR | Status: AC
  Administered 2017-08-09: 12.5 mg via INTRAVENOUS
  Filled 2017-08-09: qty 1

## 2017-08-09 MED ORDER — LIP MEDEX EX OINT
1.0000 "application " | TOPICAL_OINTMENT | CUTANEOUS | Status: DC | PRN
  Filled 2017-08-09: qty 7

## 2017-08-09 MED ORDER — SENNOSIDES-DOCUSATE SODIUM 8.6-50 MG PO TABS
1.0000 | ORAL_TABLET | Freq: Two times a day (BID) | ORAL | Status: DC
  Administered 2017-08-09 – 2017-08-11 (×5): 1 via ORAL
  Filled 2017-08-09 (×5): qty 1

## 2017-08-09 NOTE — Progress Notes (Signed)
PROGRESS NOTE    Isacc Turney Webb  GNF:[id number] DOB: 02-Dec-1975 DOA: 08/08/2017 PCP: Massie Maroon, FNP   Brief Narrative:  42 year old with history of sickle cell disease, essential hypertension, cocaine abuse returns back to the hospital with complains of left-sided chest pain shortness of breath.  Patient recently used cocaine prior to his hospital admission.  Upon admission he was found to have some chest x-ray infiltrates therefore admitted for [facility] crises with possible underlying PNA.    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 pylori duodenitis  Acute sickle cell pain crisis Acute chest syndrome - Concerns of possible pneumonia on the chest x-ray -Currently patient is on cefepime and vancomycin.  Will check procalcitonin level - Pain control on Dilaudid PCA, scheduled Toradol has been ordered.  Resume his home medications -We will wean him off PCA pump -Provide supportive care -Follow-up cultures  History of cocaine abuse -Counseled to stop quit using illicit drugs.  H. Pylori -Currently on vancomycin and cefepime.  Recently outpatient he was prescribed omeprazole/clarithromycin/amoxicillin.  Will resume this upon discharge  Essential hypertension -Continue Norvasc  DVT prophylaxis: Lovenox Code Status: Full  Family Communication:  None at bedside  Disposition Plan: maintain inpatient stay   Consultants:   None   Procedures:   None   Antimicrobials:   Vanc 6/26>  Cefepime 6/26>   Subjective: Reports of chest discomfort and generalized pain.   Review of Systems Otherwise negative except as per HPI, including: General: Denies fever, chills, night sweats or unintended weight loss. Resp: Denies cough, wheezing, shortness of breath. Cardiac: Denies  palpitations, orthopnea, paroxysmal nocturnal  dyspnea. GI: Denies abdominal pain, nausea, vomiting, diarrhea or constipation GU: Denies dysuria, frequency, hesitancy or incontinence MS: Denies muscle aches, joint pain or swelling Neuro: Denies headache, neurologic deficits (focal weakness, numbness, tingling), abnormal gait Psych: Denies anxiety, depression, SI/HI/AVH Skin: Denies new rashes or lesions ID: Denies sick contacts, exotic exposures, travel  Objective: Vitals:   08/09/17 0410 08/09/17 0929 08/09/17 0958 08/09/17 1116  BP:  127/82    Pulse: 77 (!) 49    Resp: 12 12 15 13   Temp:  97.7 F (36.5 C)    TempSrc:  Oral    SpO2: 98% 100% 99% 100%  Weight:      Height:        Intake/Output Summary (Last 24 hours) at 08/09/2017 1129 Last data filed at 08/09/2017 0700 Gross per 24 hour  Intake 2186.66 ml  Output 575 ml  Net 1611.66 ml   Filed Weights   08/08/17 2224  Weight: 80.5 kg (177 lb 7.5 oz)    Examination:  General exam: Appears calm and comfortable  Respiratory system: slightly bibasilar crackles.  Cardiovascular system: S1 & S2 heard, RRR. No JVD, murmurs, rubs, gallops or clicks. No pedal edema. Gastrointestinal system: Abdomen is nondistended, soft and nontender. No organomegaly or masses felt. Normal bowel sounds heard. Central nervous system: Alert and oriented. No focal neurological deficits. Extremities: Symmetric 5 x 5 power. Skin: No rashes, lesions or ulcers Psychiatry: Judgement and insight appear normal. Mood & affect appropriate.     Data Reviewed:   CBC: Recent Labs  Lab 08/03/17 0941 08/05/17 2127 08/08/17 1814 08/09/17 0416  WBC 8.9 9.9 13.6* 16.4*  NEUTROABS 5.2  --   --   --   HGB 14.2 14.8  13.6 12.1*  HCT 42.7 39.8 36.8* 33.0*  MCV 87 80.4 80.7 80.9  PLT 349 317 284 253   Basic Metabolic Panel: Recent Labs  Lab 08/03/17 0941 08/05/17 2127 08/08/17 1814 08/09/17 0416  NA 140 139 138 136  K 5.0 4.3 3.8 4.4  CL 105 108 105 104  CO2 22 24 27 30   GLUCOSE 87 102* 95  118*  BUN 8 9 7 8   CREATININE 1.27 1.19 1.12 1.06  CALCIUM 11.3* 11.4* 10.5* 9.7   GFR: Estimated Creatinine Clearance: 93.3 mL/min (by C-G formula based on SCr of 1.06 mg/dL). Liver Function Tests: Recent Labs  Lab 08/03/17 0941 08/05/17 2127  AST 17 21  ALT 11 17  ALKPHOS 95 92  BILITOT 1.2 2.1*  PROT 7.3 7.8  ALBUMIN 4.7 4.6   Recent Labs  Lab 08/05/17 2127  LIPASE 28   No results for input(s): AMMONIA in the last 168 hours. Coagulation Profile: No results for input(s): INR, PROTIME in the last 168 hours. Cardiac Enzymes: Recent Labs  Lab 08/08/17 2250 08/09/17 0416 08/09/17 0925  TROPONINI <0.03 <0.03 <0.03   BNP (last 3 results) No results for input(s): PROBNP in the last 8760 hours. HbA1C: No results for input(s): HGBA1C in the last 72 hours. CBG: No results for input(s): GLUCAP in the last 168 hours. Lipid Profile: No results for input(s): CHOL, HDL, LDLCALC, TRIG, CHOLHDL, LDLDIRECT in the last 72 hours. Thyroid Function Tests: No results for input(s): TSH, T4TOTAL, FREET4, T3FREE, THYROIDAB in the last 72 hours. Anemia Panel: Recent Labs    08/08/17 1926  RETICCTPCT 4.5*   Sepsis Labs: Recent Labs  Lab 08/08/17 1957  LATICACIDVEN 1.34    Recent Results (from the past 240 hour(s))  Culture, blood (routine x 2)     Status: None (Preliminary result)   Collection Time: 08/08/17  7:40 PM  Result Value Ref Range Status   Specimen Description   Final    BLOOD RIGHT ANTECUBITAL Performed at [facility], [address] [place], [place] [address]    Special Requests   Final    BOTTLES DRAWN AEROBIC AND ANAEROBIC Blood Culture adequate volume Performed at [facility], [address] [place], [place] [address]    Culture   Final    NO GROWTH < 12 HOURS Performed at [facility], [address] [place], [place] [address]    Report Status PENDING  Incomplete  Culture, blood (routine x 2)     Status:  None (Preliminary result)   Collection Time: 08/08/17  7:42 PM  Result Value Ref Range Status   Specimen Description   Final    BLOOD LEFT ANTECUBITAL Performed at [facility], [address] [place], [place] [address]    Special Requests   Final    BOTTLES DRAWN AEROBIC AND ANAEROBIC Blood Culture adequate volume Performed at [facility], [address] [place], [place] [address]    Culture   Final    NO GROWTH < 12 HOURS Performed at [facility], [address] [place], [place] [address]    Report Status PENDING  Incomplete         Radiology Studies: Dg Chest 2 View  Result Date: 08/08/2017 CLINICAL DATA:  Chest pain EXAM: CHEST - 2 VIEW COMPARISON:  05/03/2017 chest radiograph. FINDINGS: Stable cardiomediastinal silhouette with normal heart size. No pneumothorax. No pleural effusion. Mild patchy opacity at the left costophrenic angle. No  pulmonary edema. IMPRESSION: Mild patchy opacity at the left costophrenic angle, which could represent acute chest syndrome, pneumonia or atelectasis. Recommend follow-up PA and lateral post treatment chest radiographs in 4-6 weeks. Electronically Signed   By: Delbert Phenix M.D.   On: 08/08/2017 18:22        Scheduled Meds: . amLODipine  10 mg Oral Daily  . enoxaparin (LOVENOX) injection  40 mg Subcutaneous Q24H  . folic acid  1 mg Oral Daily  . HYDROmorphone   Intravenous Q4H  . ketorolac  30 mg Intravenous Q6H  . L-glutamine  10 g Oral BID  . morphine  60 mg Oral Q12H  . pantoprazole  40 mg Oral BID  . senna-docusate  1 tablet Oral BID   Continuous Infusions: . sodium chloride 100 mL/hr at 08/08/17 2244  . ceFEPime (MAXIPIME) IV Stopped (08/09/17 1610)  . diphenhydrAMINE    . vancomycin Stopped (08/09/17 0724)     LOS: 1 day    I have spent 35 minutes face to face with the patient and on the ward discussing the patients care, assessment, plan and disposition with other care  givers. >50% of the time was devoted counseling the patient about the risks and benefits of treatment and coordinating care.     Elasia Furnish Joline Maxcy, MD [facility] Pager [phone]   If 7PM-7AM, please contact night-coverage [email or web address] Password [facility] 08/09/2017, 11:29 AM

## 2017-08-10 LAB — HIV ANTIBODY (ROUTINE TESTING W REFLEX): HIV Screen 4th Generation wRfx: NONREACTIVE

## 2017-08-10 MED ORDER — IPRATROPIUM-ALBUTEROL 0.5-2.5 (3) MG/3ML IN SOLN
3.0000 mL | Freq: Three times a day (TID) | RESPIRATORY_TRACT | Status: DC
  Administered 2017-08-11: 3 mL via RESPIRATORY_TRACT
  Filled 2017-08-10: qty 3

## 2017-08-10 MED ORDER — IPRATROPIUM-ALBUTEROL 0.5-2.5 (3) MG/3ML IN SOLN: 3.0000 mL | Freq: Two times a day (BID) | RESPIRATORY_TRACT | Status: DC

## 2017-08-10 MED ORDER — AMOXICILLIN 250 MG PO CAPS
1000.0000 mg | ORAL_CAPSULE | Freq: Two times a day (BID) | ORAL | Status: DC
  Administered 2017-08-10 – 2017-08-11 (×3): 1000 mg via ORAL
  Filled 2017-08-10 (×3): qty 4

## 2017-08-10 MED ORDER — IPRATROPIUM-ALBUTEROL 0.5-2.5 (3) MG/3ML IN SOLN
3.0000 mL | Freq: Four times a day (QID) | RESPIRATORY_TRACT | Status: DC
  Administered 2017-08-10: 3 mL via RESPIRATORY_TRACT
  Filled 2017-08-10: qty 3

## 2017-08-10 MED ORDER — CLARITHROMYCIN 250 MG PO TABS
500.0000 mg | ORAL_TABLET | Freq: Two times a day (BID) | ORAL | Status: DC
  Administered 2017-08-10 – 2017-08-11 (×3): 500 mg via ORAL
  Filled 2017-08-10 (×3): qty 2

## 2017-08-10 MED ORDER — OXYCODONE HCL 5 MG PO TABS
15.0000 mg | ORAL_TABLET | Freq: Four times a day (QID) | ORAL | Status: DC | PRN
  Administered 2017-08-10 (×2): 15 mg via ORAL
  Filled 2017-08-10 (×3): qty 3

## 2017-08-10 MED ORDER — ONDANSETRON HCL 4 MG/2ML IJ SOLN
4.0000 mg | Freq: Four times a day (QID) | INTRAMUSCULAR | Status: DC | PRN
  Administered 2017-08-10: 4 mg via INTRAVENOUS
  Filled 2017-08-10: qty 2

## 2017-08-10 MED ORDER — IPRATROPIUM-ALBUTEROL 0.5-2.5 (3) MG/3ML IN SOLN
3.0000 mL | Freq: Four times a day (QID) | RESPIRATORY_TRACT | Status: DC
  Administered 2017-08-10 (×2): 3 mL via RESPIRATORY_TRACT
  Filled 2017-08-10 (×2): qty 3

## 2017-08-10 MED ORDER — PANTOPRAZOLE SODIUM 40 MG PO TBEC
40.0000 mg | DELAYED_RELEASE_TABLET | Freq: Two times a day (BID) | ORAL | Status: DC
  Administered 2017-08-10 – 2017-08-11 (×3): 40 mg via ORAL
  Filled 2017-08-10 (×3): qty 1

## 2017-08-10 MED ORDER — IPRATROPIUM-ALBUTEROL 0.5-2.5 (3) MG/3ML IN SOLN: 3.0000 mL | RESPIRATORY_TRACT | Status: DC | PRN

## 2017-08-10 NOTE — Care Management Note (Signed)
Case Management Note  Patient Details  Name: [name] MRN: [id number] Date of Birth: [birth year]  Subjective/Objective:   Pt admitted with Sickle Cell Crisis, pain                 Action/Plan: Plan to discharge home with no needs   Expected Discharge Date:                  Expected Discharge Plan:  Home/Self Care  In-House Referral:     Discharge planning Services  CM Consult  Post Acute Care Choice:    Choice offered to:     DME Arranged:    DME Agency:     HH Arranged:    HH Agency:     Status of Service:  Completed, signed off  If discussed at [employer] Length of Stay Meetings, dates discussed:    Additional CommentsGeni Bers:  Vernita Tague, RN 08/10/2017, 12:03 PM

## 2017-08-10 NOTE — Progress Notes (Signed)
PROGRESS NOTE    Dean Webb  ZOX:[id number] DOB: 10-11-1975 DOA: 08/08/2017 PCP: Massie Maroon, FNP   Brief Narrative:  42 year old with history of sickle cell disease, essential hypertension, cocaine abuse returns back to the hospital with complains of left-sided chest pain shortness of breath.  Patient recently used cocaine prior to his hospital admission.  Upon admission he was found to have some chest x-ray infiltrates therefore admitted for [facility] crises with possible underlying PNA.    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 pylori duodenitis  Acute sickle cell pain crisis Acute chest syndrome - Concerns of possible pneumonia on the chest x-ray -Procal Neg- Abx Stopped - Stop PCA pump, resume home MC contin 60mg  q12hrs, Oxy IR 15mg  q6hrs as needed -Provide supportive care -Follow-up cultures, nebs ordered.   History of cocaine abuse -Counseled to stop quit using illicit drugs.  H. Pylori -Van cefepime stopped. Amoxicillin and Clarithromycin started. PPI IBD  Essential hypertension -Continue Norvasc  DVT prophylaxis: Lovenox Code Status: Full  Family Communication:  None at bedside  Disposition Plan: maintain inpatient stay for another 24 hrs.   Consultants:   None   Procedures:   None   Antimicrobials:   Vanc 6/26>6/27  Cefepime 6/26> 6/27   Subjective: Feels a little better. Still has some shortness of breath with exertion   Review of Systems Otherwise negative except as per HPI, including: General = no fevers, chills, dizziness, malaise, fatigue HEENT/EYES = negative for pain, redness, loss of vision, double vision, blurred vision, loss of hearing, sore throat, hoarseness, dysphagia Cardiovascular= negative for chest pain, palpitation, murmurs, lower extremity swelling Respiratory/lungs=  negative for  cough, hemoptysis, wheezing, mucus production Gastrointestinal= negative for nausea, vomiting,, abdominal pain, melena, hematemesis Genitourinary= negative for Dysuria, Hematuria, Change in Urinary Frequency MSK = Negative for arthralgia, myalgias, Back Pain, Joint swelling  Neurology= Negative for headache, seizures, numbness, tingling  Psychiatry= Negative for anxiety, depression, suicidal and homocidal ideation Allergy/Immunology= Medication/Food allergy as listed  Skin= Negative for Rash, lesions, ulcers, itching   Objective: Vitals:   08/10/17 0519 08/10/17 0622 08/10/17 0854 08/10/17 1015  BP:  (!) 137/94    Pulse:  (!) 49    Resp: 10 18  18   Temp:  97.9 F (36.6 C)    TempSrc:  Oral    SpO2: 100% 100% 99% 99%  Weight:      Height:        Intake/Output Summary (Last 24 hours) at 08/10/2017 1141 Last data filed at 08/10/2017 1015 Gross per 24 hour  Intake 2467.92 ml  Output 2100 ml  Net 367.92 ml   Filed Weights   08/08/17 2224  Weight: 80.5 kg (177 lb 7.5 oz)    Examination:  Constitutional: NAD, calm, comfortable Eyes: PERRL, lids and conjunctivae normal ENMT: Mucous membranes are moist. Posterior pharynx clear of any exudate or lesions.Normal dentition.  Neck: normal, supple, no masses, no thyromegaly Respiratory: bibasilar coarse BS Cardiovascular: Regular rate and rhythm, no murmurs / rubs / gallops. No extremity edema. 2+ pedal pulses. No carotid bruits.  Abdomen: no tenderness, no masses palpated. No hepatosplenomegaly. Bowel sounds positive.  Musculoskeletal: no clubbing / cyanosis. No joint deformity upper and lower extremities. Good ROM, no contractures. Normal muscle tone.  Skin: no rashes, lesions, ulcers. No induration Neurologic: CN 2-12 grossly intact. Sensation intact,  DTR normal. Strength 5/5 in all 4.  Psychiatric: Normal judgment and insight. Alert and oriented x 3. Normal mood.      Data Reviewed:   CBC: Recent Labs  Lab  08/05/17 2127 08/08/17 1814 08/09/17 0416  WBC 9.9 13.6* 16.4*  HGB 14.8 13.6 12.1*  HCT 39.8 36.8* 33.0*  MCV 80.4 80.7 80.9  PLT 317 284 253   Basic Metabolic Panel: Recent Labs  Lab 08/05/17 2127 08/08/17 1814 08/09/17 0416  NA 139 138 136  K 4.3 3.8 4.4  CL 108 105 104  CO2 24 27 30   GLUCOSE 102* 95 118*  BUN 9 7 8   CREATININE 1.19 1.12 1.06  CALCIUM 11.4* 10.5* 9.7   GFR: Estimated Creatinine Clearance: 93.3 mL/min (by C-G formula based on SCr of 1.06 mg/dL). Liver Function Tests: Recent Labs  Lab 08/05/17 2127  AST 21  ALT 17  ALKPHOS 92  BILITOT 2.1*  PROT 7.8  ALBUMIN 4.6   Recent Labs  Lab 08/05/17 2127  LIPASE 28   No results for input(s): AMMONIA in the last 168 hours. Coagulation Profile: No results for input(s): INR, PROTIME in the last 168 hours. Cardiac Enzymes: Recent Labs  Lab 08/08/17 2250 08/09/17 0416 08/09/17 0925  TROPONINI <0.03 <0.03 <0.03   BNP (last 3 results) No results for input(s): PROBNP in the last 8760 hours. HbA1C: No results for input(s): HGBA1C in the last 72 hours. CBG: No results for input(s): GLUCAP in the last 168 hours. Lipid Profile: No results for input(s): CHOL, HDL, LDLCALC, TRIG, CHOLHDL, LDLDIRECT in the last 72 hours. Thyroid Function Tests: No results for input(s): TSH, T4TOTAL, FREET4, T3FREE, THYROIDAB in the last 72 hours. Anemia Panel: Recent Labs    08/08/17 1926  RETICCTPCT 4.5*   Sepsis Labs: Recent Labs  Lab 08/08/17 1957 08/09/17 1205  PROCALCITON  --  <0.10  LATICACIDVEN 1.34  --     Recent Results (from the past 240 hour(s))  Culture, blood (routine x 2)     Status: None (Preliminary result)   Collection Time: 08/08/17  7:40 PM  Result Value Ref Range Status   Specimen Description   Final    BLOOD RIGHT ANTECUBITAL Performed at [facility], [address] [place], [place] [address]    Special Requests   Final    BOTTLES DRAWN AEROBIC AND ANAEROBIC  Blood Culture adequate volume Performed at [facility], [address] [place], [place] [address]    Culture   Final    NO GROWTH 2 DAYS Performed at [facility], [address] [place], [place] [address]    Report Status PENDING  Incomplete  Culture, blood (routine x 2)     Status: None (Preliminary result)   Collection Time: 08/08/17  7:42 PM  Result Value Ref Range Status   Specimen Description   Final    BLOOD LEFT ANTECUBITAL Performed at [facility], [address] [place], [place] [address]    Special Requests   Final    BOTTLES DRAWN AEROBIC AND ANAEROBIC Blood Culture adequate volume Performed at [facility], [address] [place], [place] [address]    Culture   Final    NO GROWTH 2 DAYS Performed at [facility], [address] [place], [place] [address]    Report Status PENDING  Incomplete         Radiology Studies: Dg Chest 2 View  Result Date: 08/08/2017 CLINICAL  DATA:  Chest pain EXAM: CHEST - 2 VIEW COMPARISON:  05/03/2017 chest radiograph. FINDINGS: Stable cardiomediastinal silhouette with normal heart size. No pneumothorax. No pleural effusion. Mild patchy opacity at the left costophrenic angle. No pulmonary edema. IMPRESSION: Mild patchy opacity at the left costophrenic angle, which could represent acute chest syndrome, pneumonia or atelectasis. Recommend follow-up PA and lateral post treatment chest radiographs in 4-6 weeks. Electronically Signed   By: Delbert Phenix M.D.   On: 08/08/2017 18:22        Scheduled Meds: . amLODipine  10 mg Oral Daily  . amoxicillin  1,000 mg Oral Q12H  . clarithromycin  500 mg Oral Q12H  . enoxaparin (LOVENOX) injection  40 mg Subcutaneous Q24H  . folic acid  1 mg Oral Daily  . ipratropium-albuterol  3 mL Nebulization Q6H  . ketorolac  30 mg Intravenous Q6H  . L-glutamine  10 g Oral BID  . morphine  60 mg Oral Q12H  . pantoprazole  40 mg Oral  BID  . senna-docusate  1 tablet Oral BID   Continuous Infusions: . sodium chloride 100 mL/hr at 08/10/17 0522     LOS: 2 days    I have spent 25 minutes face to face with the patient and on the ward discussing the patients care, assessment, plan and disposition with other care givers. >50% of the time was devoted counseling the patient about the risks and benefits of treatment and coordinating care.     Ankit Joline Maxcy, MD [facility] Pager [phone]   If 7PM-7AM, please contact night-coverage [email or web address] Password [email or web address] 08/10/2017, 11:41 AM

## 2017-08-11 LAB — CBC WITH DIFFERENTIAL/PLATELET
Basophils Absolute: 0 10*3/uL (ref 0.0–0.1)
Basophils Relative: 0 %
EOS ABS: 0.6 10*3/uL (ref 0.0–0.7)
Eosinophils Relative: 5 %
HCT: 37.4 % — ABNORMAL LOW (ref 39.0–52.0)
Hemoglobin: 13.7 g/dL (ref 13.0–17.0)
LYMPHS ABS: 3.3 10*3/uL (ref 0.7–4.0)
Lymphocytes Relative: 28 %
MCH: 29.6 pg (ref 26.0–34.0)
MCHC: 36.6 g/dL — AB (ref 30.0–36.0)
MCV: 80.8 fL (ref 78.0–100.0)
MONOS PCT: 12 %
Monocytes Absolute: 1.4 10*3/uL — ABNORMAL HIGH (ref 0.1–1.0)
NEUTROS ABS: 6.5 10*3/uL (ref 1.7–7.7)
Neutrophils Relative %: 55 %
Platelets: 232 10*3/uL (ref 150–400)
RBC: 4.63 MIL/uL (ref 4.22–5.81)
RDW: 16.5 % — AB (ref 11.5–15.5)
WBC: 11.8 10*3/uL — AB (ref 4.0–10.5)
nRBC: 1 /100 WBC — ABNORMAL HIGH

## 2017-08-11 LAB — BASIC METABOLIC PANEL
Anion gap: 7 (ref 5–15)
BUN: 5 mg/dL — ABNORMAL LOW (ref 6–20)
CALCIUM: 10 mg/dL (ref 8.9–10.3)
CHLORIDE: 106 mmol/L (ref 98–111)
CO2: 26 mmol/L (ref 22–32)
Creatinine, Ser: 1 mg/dL (ref 0.61–1.24)
GFR calc non Af Amer: >60 mL/min (ref >60)
Glucose, Bld: 99 mg/dL (ref 70–99)
Potassium: 4.1 mmol/L (ref 3.5–5.1)
SODIUM: 139 mmol/L (ref 135–145)

## 2017-08-11 LAB — MAGNESIUM: MAGNESIUM: 1.9 mg/dL (ref 1.7–2.4)

## 2017-08-11 MED ORDER — PANTOPRAZOLE SODIUM 40 MG PO TBEC: 40.0000 mg | DELAYED_RELEASE_TABLET | Freq: Two times a day (BID) | ORAL | 0 refills | Status: DC

## 2017-08-11 MED ORDER — CLARITHROMYCIN 500 MG PO TABS: 500.0000 mg | ORAL_TABLET | Freq: Two times a day (BID) | ORAL | 0 refills | Status: AC

## 2017-08-11 MED ORDER — AMOXICILLIN 500 MG PO CAPS: 1000.0000 mg | ORAL_CAPSULE | Freq: Two times a day (BID) | ORAL | 0 refills | Status: AC

## 2017-08-11 NOTE — Discharge Summary (Addendum)
Physician Discharge Summary  Dean Webb AVW:[id number] DOB: May 19, 1975 DOA: 08/08/2017  PCP: Massie Maroon, FNP  Admit date: 08/08/2017 Discharge date: 08/11/2017  Admitted From: Home  Disposition:  Home   Recommendations for Outpatient Follow-up:  1. Follow up with PCP in 1-2 weeks 2. Please obtain BMP/CBC in one week your next doctors visit.  3. Folllow up at Sick[facility] Cell clinic in one week  4. Take outpatient amoxicillin, clarithromycin and PPI as prescribed for recent H. pylori diagnosis.   Discharge Condition: Stable CODE STATUS: Full  Diet recommendation: Regular   Brief/Interim Summary: 42 year old with history of sickle cell disease, essential hypertension, cocaine abuse returns back to the hospital with complains of left-sided chest pain shortness of breath.  Patient recently used cocaine prior to his hospital admission.  Upon admission he was found to have some chest x-ray infiltrates therefore admitted for [facility] crises with possible underlying PNA.  Initially patient was started on vancomycin and cefepime but this was discontinued after his procalcitonin came back negative and suspicion for any underlying infection remain low.  His outpatient treatment for H. pylori was resumed with amoxicillin, clarithromycin and PPI twice daily.  At this time patient is tolerating oral diet and his pain is well controlled.  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 pylori duodenitis   Acute sickle cell pain crisis; resolved  Acute chest syndrome; resolved  Sick Cell Disease  - Concerns of possible pneumonia on the chest x-ray -Procal Neg- Abx Stopped - resume home MC contin 60mg   q12hrs, Oxy IR 15mg  q6hrs as needed -Provide supportive care  History of cocaine abuse -Counseled to stop quit using illicit drugs.  H. Pylori -Van cefepime stopped. Amoxicillin and Clarithromycin outpatient med resumed. PPI BID. Complete 14 day course.   Essential hypertension -Continue Norvasc  Discharge Instructions   Allergies as of 08/11/2017   No Known Allergies     Medication List    TAKE these medications   albuterol 108 (90 Base) MCG/ACT inhaler Commonly known as:  PROVENTIL HFA;VENTOLIN HFA Inhale 2 puffs into the lungs every 6 (six) hours as needed for wheezing or shortness of breath.   amLODipine 10 MG tablet Commonly known as:  NORVASC Take 1 tablet (10 mg total) by mouth daily.   amoxicillin 500 MG capsule Commonly known as:  AMOXIL Take 2 capsules (1,000 mg total) by mouth every 12 (twelve) hours for 14 days.   cetirizine 10 MG tablet Commonly known as:  ZYRTEC Take 1 tablet (10 mg total) by mouth daily as needed for allergies.   clarithromycin 500 MG tablet Commonly known as:  BIAXIN Take 1 tablet (500 mg total) by mouth every 12 (twelve) hours for 14 days.   folic acid 1 MG tablet Commonly known as:  FOLVITE Take 1 tablet (1 mg total) by mouth daily.   ibuprofen 800 MG tablet Commonly known as:  ADVIL,MOTRIN Take 1 tablet (800 mg total) by mouth every 8 (eight) hours as needed. What changed:  reasons to take this   L-glutamine 5 g Pack Powder Packet Commonly known as:  ENDARI Take 10 g by mouth 2 (two) times daily.   morphine 60 MG 12 hr tablet Commonly known  as:  MS CONTIN Take 1 tablet (60 mg total) by mouth every 12 (twelve) hours for 15 days.   omeprazole 20 MG capsule Commonly known as:  PRILOSEC Take 1 capsule (20 mg total) by mouth 2 (two) times daily before a meal.   oxyCODONE 15 MG immediate release tablet Commonly known as:  ROXICODONE Take 1 tablet (15 mg total) by mouth every 6 (six) hours as needed for up to 15 days for  pain.   pantoprazole 40 MG tablet Commonly known as:  PROTONIX Take 1 tablet (40 mg total) by mouth 2 (two) times daily for 14 days.      Follow-up Information    Massie Maroon, FNP. Schedule an appointment as soon as possible for a visit in 1 week(s).   Specialty:  Family Medicine Contact information: [address] [place] [address] [phone]          No Known Allergies  You were cared for by a hospitalist during your hospital stay. If you have any questions about your discharge medications or the care you received while you were in the hospital after you are discharged, you can call the unit and asked to speak with the hospitalist on call if the hospitalist that took care of you is not available. Once you are discharged, your primary care physician will handle any further medical issues. Please note that no refills for any discharge medications will be authorized once you are discharged, as it is imperative that you return to your primary care physician (or establish a relationship with a primary care physician if you do not have one) for your aftercare needs so that they can reassess your need for medications and monitor your lab values.  Consultations:  None   Procedures/Studies: Dg Chest 2 View  Result Date: 08/08/2017 CLINICAL DATA:  Chest pain EXAM: CHEST - 2 VIEW COMPARISON:  05/03/2017 chest radiograph. FINDINGS: Stable cardiomediastinal silhouette with normal heart size. No pneumothorax. No pleural effusion. Mild patchy opacity at the left costophrenic angle. No pulmonary edema. IMPRESSION: Mild patchy opacity at the left costophrenic angle, which could represent acute chest syndrome, pneumonia or atelectasis. Recommend follow-up PA and lateral post treatment chest radiographs in 4-6 weeks. Electronically Signed   By: Delbert Phenix M.D.   On: 08/08/2017 18:22   US Abdomen Complete  Result Date: 08/06/2017 CLINICAL DATA:  Initial evaluation for upper  abdominal pain. History of sickle cell disease, hypertension EXAM: ABDOMEN ULTRASOUND COMPLETE COMPARISON:  None. FINDINGS: Gallbladder: Surgically absent. No abnormality at the gallbladder fossa. No sonographic Murphy sign elicited on exam. Common bile duct: Diameter: 8.2 mm Liver: No focal lesion identified. Within normal limits in parenchymal echogenicity. Portal vein is patent on color Doppler imaging with normal direction of blood flow towards the liver. IVC: No abnormality visualized. Pancreas: Visualized portion unremarkable. Spleen: Spleen is atrophic in appearance measuring 6.3 cm with coarse echogenic echotexture and lobulated contour, similar prior CT. Right Kidney: Length: 9.4 cm. Right kidney position within the right pelvis. Echogenicity within normal limits. No mass or hydronephrosis visualized. Left Kidney: Length: 10.2 cm. Echogenicity within normal limits. No mass or hydronephrosis visualized. Abdominal aorta: No aneurysm visualized. Other findings: None. IMPRESSION: 1. No acute abnormality within the abdomen. 2. 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 3. Pelvic right kidney.  No hydronephrosis. 4. Status post cholecystectomy. Electronically Signed   By: Rise Mu M.D.   On: 08/06/2017  00:43     Subjective: No complaints. Tolerating diet.   General = no fevers, chills, dizziness, malaise, fatigue HEENT/EYES = negative for pain, redness, loss of vision, double vision, blurred vision, loss of hearing, sore throat, hoarseness, dysphagia Cardiovascular= negative for chest pain, palpitation, murmurs, lower extremity swelling Respiratory/lungs= negative for shortness of breath, cough, hemoptysis, wheezing, mucus production Gastrointestinal= negative for nausea, vomiting,, abdominal pain, melena, hematemesis Genitourinary= negative for Dysuria, Hematuria, Change in Urinary Frequency MSK = Negative for arthralgia,  myalgias, Back Pain, Joint swelling  Neurology= Negative for headache, seizures, numbness, tingling  Psychiatry= Negative for anxiety, depression, suicidal and homocidal ideation Allergy/Immunology= Medication/Food allergy as listed  Skin= Negative for Rash, lesions, ulcers, itching   Discharge Exam: Vitals:   08/11/17 0552 08/11/17 0905  BP: 140/81   Pulse: 70 62  Resp: 14 17  Temp: 98.2 F (36.8 C)   SpO2: 100% 97%   Vitals:   08/10/17 1923 08/10/17 2137 08/11/17 0552 08/11/17 0905  BP:  (!) 151/94 140/81   Pulse:  67 70 62  Resp:  16 14 17   Temp:  98.7 F (37.1 C) 98.2 F (36.8 C)   TempSrc:  Oral Oral   SpO2: 97% 97% 100% 97%  Weight:      Height:        General: Pt is alert, awake, not in acute distress Cardiovascular: RRR, S1/S2 +, no rubs, no gallops Respiratory: CTA bilaterally, no wheezing, no rhonchi Abdominal: Soft, NT, ND, bowel sounds + Extremities: no edema, no cyanosis    The results of significant diagnostics from this hospitalization (including imaging, microbiology, ancillary and laboratory) are listed below for reference.     Microbiology: Recent Results (from the past 240 hour(s))  Culture, blood (routine x 2)     Status: None (Preliminary result)   Collection Time: 08/08/17  7:40 PM  Result Value Ref Range Status   Specimen Description   Final    BLOOD RIGHT ANTECUBITAL Performed at [facility], [address] [place], [place] [address]    Special Requests   Final    BOTTLES DRAWN AEROBIC AND ANAEROBIC Blood Culture adequate volume Performed at [facility], [address] [place], [place] [address]    Culture   Final    NO GROWTH 3 DAYS Performed at [facility], [address], [place], [place] [address]    Report Status PENDING  Incomplete  Culture, blood (routine x 2)     Status: None (Preliminary result)   Collection Time: 08/08/17  7:42 PM  Result Value Ref Range Status   Specimen  Description   Final    BLOOD LEFT ANTECUBITAL Performed at [facility], [address], [place], [place] [address]    Special Requests   Final    BOTTLES DRAWN AEROBIC AND ANAEROBIC Blood Culture adequate volume Performed at [facility], [address] [place], [place] [address]    Culture   Final    NO GROWTH 3 DAYS Performed at [facility], [address] [place], [place] [address]    Report Status PENDING  Incomplete     Labs: BNP (last 3 results) No results for input(s): BNP in the last 8760 hours. Basic Metabolic Panel: Recent Labs  Lab 08/05/17 2127 08/08/17 1814 08/09/17 0416 08/11/17 0328  NA 139 138 136 139  K 4.3 3.8 4.4 4.1  CL 108 105 104 106  CO2 24 27 30  26  GLUCOSE 102* 95 118* 99  BUN 9 7 8  5*  CREATININE 1.19 1.12 1.06 1.00  CALCIUM 11.4* 10.5* 9.7 10.0  MG  --   --   --  1.9   Liver Function Tests: Recent Labs  Lab 08/05/17 2127  AST 21  ALT 17  ALKPHOS 92  BILITOT 2.1*  PROT 7.8  ALBUMIN 4.6   Recent Labs  Lab 08/05/17 2127  LIPASE 28   No results for input(s): AMMONIA in the last 168 hours. CBC: Recent Labs  Lab 08/05/17 2127 08/08/17 1814 08/09/17 0416 08/11/17 0328  WBC 9.9 13.6* 16.4* 11.8*  NEUTROABS  --   --   --  6.5  HGB 14.8 13.6 12.1* 13.7  HCT 39.8 36.8* 33.0* 37.4*  MCV 80.4 80.7 80.9 80.8  PLT 317 284 253 232   Cardiac Enzymes: Recent Labs  Lab 08/08/17 2250 08/09/17 0416 08/09/17 0925  TROPONINI <0.03 <0.03 <0.03   BNP: Invalid input(s): POCBNP CBG: No results for input(s): GLUCAP in the last 168 hours. D-Dimer Recent Labs    08/08/17 1926  DDIMER 0.67*   Hgb A1c No results for input(s): HGBA1C in the last 72 hours. Lipid Profile No results for input(s): CHOL, HDL, LDLCALC, TRIG, CHOLHDL, LDLDIRECT in the last 72 hours. Thyroid function studies No results for input(s): TSH, T4TOTAL, T3FREE, THYROIDAB in the last 72 hours.  Invalid input(s):  FREET3 Anemia work up Recent Labs    08/08/17 1926  RETICCTPCT 4.5*   Urinalysis    Component Value Date/Time   COLORURINE YELLOW 04/30/2017 0122   APPEARANCEUR CLEAR 04/30/2017 0122   LABSPEC >1.046 (H) 04/30/2017 0122   PHURINE 5.0 04/30/2017 0122   GLUCOSEU NEGATIVE 04/30/2017 0122   HGBUR NEGATIVE 04/30/2017 0122   BILIRUBINUR neg 08/03/2017 1005   KETONESUR NEGATIVE 04/30/2017 0122   PROTEINUR Negative 08/03/2017 1005   PROTEINUR NEGATIVE 04/30/2017 0122   UROBILINOGEN 0.2 08/03/2017 1005   UROBILINOGEN 0.2 11/01/2012 1010   NITRITE neg 08/03/2017 1005   NITRITE NEGATIVE 04/30/2017 0122   LEUKOCYTESUR NEGATIVE 04/30/2017 0122   Sepsis Labs Invalid input(s): PROCALCITONIN,  WBC,  LACTICIDVEN Microbiology Recent Results (from the past 240 hour(s))  Culture, blood (routine x 2)     Status: None (Preliminary result)   Collection Time: 08/08/17  7:40 PM  Result Value Ref Range Status   Specimen Description   Final    BLOOD RIGHT ANTECUBITAL Performed at [facility], [address] [place], [place] [address]    Special Requests   Final    BOTTLES DRAWN AEROBIC AND ANAEROBIC Blood Culture adequate volume Performed at [facility], [address] [place], [place] [address]    Culture   Final    NO GROWTH 3 DAYS Performed at [facility], [address] [place], [place] [address]    Report Status PENDING  Incomplete  Culture, blood (routine x 2)     Status: None (Preliminary result)   Collection Time: 08/08/17  7:42 PM  Result Value Ref Range Status   Specimen Description   Final    BLOOD LEFT ANTECUBITAL Performed at [facility], [address] [place], [place] [address]    Special Requests   Final    BOTTLES DRAWN AEROBIC AND ANAEROBIC Blood Culture adequate volume Performed at [facility], [address] [place], [place] [address]    Culture   Final    NO GROWTH 3  DAYS Performed at [facility], [address], [place], [place] [address]    Report Status PENDING  Incomplete     Time coordinating discharge:  I have spent 35 minutes face to face with the patient and on the ward discussing the patients care, assessment, plan and disposition with other care givers. >50% of the time was devoted counseling the patient about the risks and benefits of treatment/Discharge disposition and coordinating care.   SIGNED:   Dimple Nanas, MD  [facility] 08/11/2017, 9:36 AM Pager   If 7PM-7AM, please contact night-coverage [email or web address] Password [email or web address]

## 2017-08-11 NOTE — Progress Notes (Signed)
Pt was discharged home today. Instructions were reviewed with patient, and questions were answered. Pt walked to main entrance with NT.  

## 2017-08-13 ENCOUNTER — Other Ambulatory Visit: Payer: Self-pay | Admitting: Family Medicine

## 2017-08-13 DIAGNOSIS — Z79891 Long term (current) use of opiate analgesic: Secondary | ICD-10-CM

## 2017-08-13 LAB — CULTURE, BLOOD (ROUTINE X 2)
CULTURE: NO GROWTH
CULTURE: NO GROWTH
Special Requests: ADEQUATE
Special Requests: ADEQUATE

## 2017-08-13 NOTE — Progress Notes (Signed)
Minna AntisDonald Lenker, a 42 year old patient with a history of sickle cell anemia and chronic pain called requesting a letter stating that he is competent to take medications, manage money and household.  Advised patient to have disability services fax forms to our office.   Also, patient was advised to start medications for Helicobacter pylori, including amoxicillin, pantoprazole, and clarithromycin.   Also, discussed urine drug screen at length. Urine drug screen was positive for cocaine. We will not be able to prescribe opiate medications at this time. Patient will need to have consecutive drug screens. Will also send a referral to pain management.   Nolon NationsLachina Moore Dylan Ruotolo  MSN, FNP-C Patient Care [facility] [address]  [place], [place] [address] [phone]

## 2017-09-03 ENCOUNTER — Ambulatory Visit: Payer: Self-pay | Admitting: Family Medicine

## 2017-09-10 ENCOUNTER — Ambulatory Visit: Payer: Self-pay | Admitting: Family Medicine

## 2017-09-17 ENCOUNTER — Telehealth: Payer: Self-pay

## 2017-09-17 ENCOUNTER — Ambulatory Visit (INDEPENDENT_AMBULATORY_CARE_PROVIDER_SITE_OTHER): Payer: Medicaid Other | Admitting: Family Medicine

## 2017-09-17 VITALS — BP 160/100 | HR 86 | Temp 98.2°F | Resp 16 | Ht 67.0 in | Wt 183.0 lb

## 2017-09-17 DIAGNOSIS — Z79891 Long term (current) use of opiate analgesic: Secondary | ICD-10-CM | POA: Diagnosis not present

## 2017-09-17 DIAGNOSIS — D5720 Sickle-cell/Hb-C disease without crisis: Secondary | ICD-10-CM

## 2017-09-17 DIAGNOSIS — I10 Essential (primary) hypertension: Secondary | ICD-10-CM | POA: Diagnosis not present

## 2017-09-17 DIAGNOSIS — J4520 Mild intermittent asthma, uncomplicated: Secondary | ICD-10-CM | POA: Diagnosis not present

## 2017-09-17 DIAGNOSIS — Z716 Tobacco abuse counseling: Secondary | ICD-10-CM

## 2017-09-17 DIAGNOSIS — K219 Gastro-esophageal reflux disease without esophagitis: Secondary | ICD-10-CM

## 2017-09-17 DIAGNOSIS — F17200 Nicotine dependence, unspecified, uncomplicated: Secondary | ICD-10-CM

## 2017-09-17 MED ORDER — ALBUTEROL SULFATE HFA 108 (90 BASE) MCG/ACT IN AERS: 2.0000 | INHALATION_SPRAY | Freq: Four times a day (QID) | RESPIRATORY_TRACT | 1 refills | Status: DC | PRN

## 2017-09-17 MED ORDER — AMLODIPINE BESYLATE 10 MG PO TABS: 10.0000 mg | ORAL_TABLET | Freq: Every day | ORAL | 1 refills | Status: DC

## 2017-09-17 MED ORDER — FOLIC ACID 1 MG PO TABS: 1.0000 mg | ORAL_TABLET | Freq: Every day | ORAL | 1 refills | Status: DC

## 2017-09-17 MED ORDER — OMEPRAZOLE 20 MG PO CPDR: 20.0000 mg | DELAYED_RELEASE_CAPSULE | Freq: Two times a day (BID) | ORAL | 3 refills | Status: DC

## 2017-09-17 MED ORDER — CETIRIZINE HCL 10 MG PO TABS: 10.0000 mg | ORAL_TABLET | Freq: Every day | ORAL | 1 refills | Status: DC | PRN

## 2017-09-17 MED ORDER — NICOTINE 21 MG/24HR TD PT24: 21.0000 mg | MEDICATED_PATCH | Freq: Every day | TRANSDERMAL | 0 refills | Status: DC

## 2017-09-17 MED ORDER — OXYCODONE HCL 15 MG PO TABS: 15.0000 mg | ORAL_TABLET | Freq: Four times a day (QID) | ORAL | 0 refills | Status: AC | PRN

## 2017-09-17 MED ORDER — MORPHINE SULFATE ER 60 MG PO TBCR: 60.0000 mg | EXTENDED_RELEASE_TABLET | Freq: Two times a day (BID) | ORAL | 0 refills | Status: AC

## 2017-09-17 MED ORDER — IBUPROFEN 800 MG PO TABS: 800.0000 mg | ORAL_TABLET | Freq: Three times a day (TID) | ORAL | 0 refills | Status: DC | PRN

## 2017-09-17 NOTE — Patient Instructions (Signed)
Nicotine skin patches What is this medicine? NICOTINE (NIK oh teen) helps people stop smoking. The patches replace the nicotine found in cigarettes and help to decrease withdrawal effects. They are most effective when used in combination with a stop-smoking program. This medicine may be used for other purposes; ask your health care provider or pharmacist if you have questions. COMMON BRAND NAME(S): Habitrol, Nicoderm CQ, Nicotrol What should I tell my health care provider before I take this medicine? They need to know if you have any of these conditions: -diabetes -heart disease, angina, irregular heartbeat or previous heart attack -high blood pressure -lung disease, including asthma -overactive thyroid -pheochromocytoma -seizures or a history of seizures -skin problems, like eczema -stomach problems or ulcers -an unusual or allergic reaction to nicotine, adhesives, other medicines, foods, dyes, or preservatives -pregnant or trying to get pregnant -breast-feeding How should I use this medicine? This medicine is for use on the skin. Follow the directions that come with the patches. Find an area of skin on your upper arm, chest, or back that is clean, dry, greaseless, undamaged and hairless. Wash hands with plain soap and water. Do not use anything that contains aloe, lanolin or glycerin as these may prevent the patch from sticking. Dry thoroughly. Remove the patch from the sealed pouch. Do not try to cut or trim the patch. Using your palm, press the patch firmly in place for 10 seconds to make sure that there is good contact with your skin. After applying the patch, wash your hands. Change the patch every day, keeping to a regular schedule. When you apply a new patch, use a new area of skin. Wait at least 1 week before using the same area again. Talk to your pediatrician regarding the use of this medicine in children. Special care may be needed. Overdosage: If you think you have taken too much  of this medicine contact a poison control center or emergency room at once. NOTE: This medicine is only for you. Do not share this medicine with others. What if I miss a dose? If you forget to replace a patch, use it as soon as you can. Only use one patch at a time and do not leave on the skin for longer than directed. If a patch falls off, you can replace it, but keep to your schedule and remove the patch at the right time. What may interact with this medicine? -medicines for asthma -medicines for blood pressure -medicines for mental depression This list may not describe all possible interactions. Give your health care provider a list of all the medicines, herbs, non-prescription drugs, or dietary supplements you use. Also tell them if you smoke, drink alcohol, or use illegal drugs. Some items may interact with your medicine. What should I watch for while using this medicine? You should begin using the nicotine patch the day you stop smoking. It is okay if you do not succeed at your attempt to quit and have a cigarette. You can still continue your quit attempt and keep using the product as directed. Just throw away your cigarettes and get back to your quit plan. You can keep the patch in place during swimming, bathing, and showering. If your patch falls off during these activities, replace it. When you first apply the patch, your skin may itch or burn. This should go away soon. When you remove a patch, the skin may look red, but this should only last for a few days. Call your doctor or health care professional   if skin redness does not go away after 4 days, if your skin swells, or if you get a rash. If you are a diabetic and you quit smoking, the effects of insulin may be increased and you may need to reduce your insulin dose. Check with your doctor or health care professional about how you should adjust your insulin dose. If you are going to have a magnetic resonance imaging (MRI) procedure, tell your  MRI technician if you have this patch on your body. It must be removed before a MRI. What side effects may I notice from receiving this medicine? Side effects that you should report to your doctor or health care professional as soon as possible: -allergic reactions like skin rash, itching or hives, swelling of the face, lips, or tongue -breathing problems -changes in hearing -changes in vision -chest pain -cold sweats -confusion -fast, irregular heartbeat -feeling faint or lightheaded, falls -headache -increased saliva -skin redness that lasts more than 4 days -stomach pain -signs and symptoms of nicotine overdose like nausea; vomiting; dizziness; weakness; and rapid heartbeat Side effects that usually do not require medical attention (report to your doctor or health care professional if they continue or are bothersome): -diarrhea -dry mouth -hiccups -irritability -nervousness or restlessness -trouble sleeping or vivid dreams This list may not describe all possible side effects. Call your doctor for medical advice about side effects. You may report side effects to FDA at 1-800-FDA-1088. Where should I keep my medicine? Keep out of the reach of children. Store at room temperature between 20 and 25 degrees C (68 and 77 degrees F). Protect from heat and light. Store in Tax inspector until ready to use. Throw away unused medicine after the expiration date. When you remove a patch, fold with sticky sides together; put in an empty opened pouch and throw away. NOTE: This sheet is a summary. It may not cover all possible information. If you have questions about this medicine, talk to your doctor, pharmacist, or health care provider.  2018 Elsevier/Gold Standard (2013-12-29 15:46:21) Sickle Cell Anemia, Adult Sickle cell anemia is a condition where your red blood cells are shaped like sickles. Red blood cells carry oxygen through the body. Sickle-shaped red blood cells do not live as  long as normal red blood cells. They also clump together and block blood from flowing through the blood vessels. These things prevent the body from getting enough oxygen. Sickle cell anemia causes organ damage and pain. It also increases the risk of infection. Follow these instructions at home:  Drink enough fluid to keep your pee (urine) clear or pale yellow. Drink more in hot weather and during exercise.  Do not smoke. Smoking lowers oxygen levels in the blood.  Only take over-the-counter or prescription medicines as told by your doctor.  Take antibiotic medicines as told by your doctor. Make sure you finish them even if you start to feel better.  Take supplements as told by your doctor.  Consider wearing a medical alert bracelet. This tells anyone caring for you in an emergency of your condition.  When traveling, keep your medical information, doctors' names, and the medicines you take with you at all times.  If you have a fever, do not take fever medicines right away. This could cover up a problem. Tell your doctor.  Keep all follow-up visits with your doctor. Sickle cell anemia requires regular medical care. Contact a doctor if: You have a fever. Get help right away if:  You feel dizzy or faint.  You have new belly (abdominal) pain, especially on the left side near the stomach area.  You have a lasting, often uncomfortable and painful erection of the penis (priapism). If it is not treated right away, you will become unable to have sex (impotence).  You have numbness in your arms or legs or you have a hard time moving them.  You have a hard time talking.  You have a fever or lasting symptoms for more than 2-3 days.  You have a fever and your symptoms suddenly get worse.  You have signs or symptoms of infection. These include: ? Chills. ? Being more tired than normal (lethargy). ? Irritability. ? Poor eating. ? Throwing up (vomiting).  You have pain that is not helped  with medicine.  You have shortness of breath.  You have pain in your chest.  You are coughing up pus-like or bloody mucus.  You have a stiff neck.  Your feet or hands swell or have pain.  Your belly looks bloated.  Your joints hurt. This information is not intended to replace advice given to you by your health care provider. Make sure you discuss any questions you have with your health care provider. Document Released: 11/20/2012 Document Revised: 07/08/2015 Document Reviewed: 09/11/2012 Elsevier Interactive Patient Education  2017 [employer]

## 2017-09-17 NOTE — Progress Notes (Signed)
Subjective   Dean Webb 42 y.o. male  [id number]  [id number]  31-May-1975 42 y.o.     Chief Complaint  Patient presents with  . Sickle Cell Anemia  . Medication Refill    all meds    Patient presents for sickle cell follow up and medication refills. Hx of GERD, Asthma, and HTN.  Patient states that he would like to have nicotine patches for smoking cessation.  Has a referral to opthmalogy this month.  Past Medical History:  Diagnosis Date  . Asthma   . GERD (gastroesophageal reflux disease)   . Hb-S/Hb-C disease (HCC) 1979  . HTN (hypertension) 2008  . Hypertension   . Sickle cell anemia (HCC)   . Sickle cell disease (HCC)     Social History   Socioeconomic History  . Marital status: Single    Spouse name: Not on file  . Number of children: Not on file  . Years of education: Not on file  . Highest education level: Not on file  Occupational History  . Not on file  Social Needs  . Financial resource strain: Not on file  . Food insecurity:    Worry: Not on file    Inability: Not on file  . Transportation needs:    Medical: Not on file    Non-medical: Not on file  Tobacco Use  . Smoking status: Current Every Day Smoker    Packs/day: 0.50    Years: 17.00    Pack years: 8.50  . Smokeless tobacco: Never Used  Substance and Sexual Activity  . Alcohol use: Yes    Comment: Occasional drinker  . Drug use: Not Currently    Types: Marijuana  . Sexual activity: Yes  Lifestyle  . Physical activity:    Days per week: Not on file    Minutes per session: Not on file  . Stress: Not on file  Relationships  . Social connections:    Talks on phone: Not on file    Gets together: Not on file    Attends religious service: Not on file    Active member of club or organization: Not on file    Attends meetings of clubs or organizations: Not on file    Relationship status: Not on file  . Intimate partner violence:    Fear of current or ex partner: Not on file     Emotionally abused: Not on file    Physically abused: Not on file    Forced sexual activity: Not on file  Other Topics Concern  . Not on file  Social History Narrative  . Not on file      Review of Systems  Constitutional: Negative.   HENT: Negative.   Eyes: Negative.   Respiratory: Negative.   Cardiovascular: Negative.   Gastrointestinal: Negative.   Genitourinary: Negative.   Musculoskeletal: Positive for myalgias.  Skin: Negative.   Neurological: Negative.   Psychiatric/Behavioral: Negative.     Objective   Physical Exam  Constitutional: He is oriented to person, place, and time. He appears well-developed and well-nourished.  HENT:  Head: Normocephalic and atraumatic.  Eyes: Pupils are equal, round, and reactive to light. Conjunctivae and EOM are normal.  Neck: Normal range of motion. Neck supple. No tracheal deviation present. No thyromegaly present.  Cardiovascular: Normal rate, regular rhythm, normal heart sounds and intact distal pulses. Exam reveals no friction rub.  No murmur heard. Pulmonary/Chest: Effort normal and breath sounds normal. No stridor. No respiratory distress. He  has no wheezes.  Abdominal: Soft. Bowel sounds are normal. He exhibits no distension and no mass. There is no tenderness.  Musculoskeletal: Normal range of motion. He exhibits tenderness (bilateral lower extremities. ).  Lymphadenopathy:    He has no cervical adenopathy.  Neurological: He is alert and oriented to person, place, and time.  Skin: Skin is warm and dry.  Psychiatric: He has a normal mood and affect. His behavior is normal. Judgment and thought content normal.  Nursing note and vitals reviewed.   BP (!) 160/100 (BP Location: Left Arm, Patient Position: Sitting, Cuff Size: Large)   Pulse 86   Temp 98.2 F (36.8 C) (Oral)   Resp 16   Ht 5\' 7"  (1.702 m)   Wt 183 lb (83 kg)   SpO2 100%   BMI 28.66 kg/m   Assessment   Encounter Diagnoses  Name Primary?  . Essential  hypertension   . 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 Chronic prescription opiate use   . Mild intermittent asthma without complication   . Tobacco dependence   . Tobacco abuse counseling   . Gastroesophageal reflux disease without esophagitis      Plan   1. Essential hypertension Continue with current medications Patient has been out of medication.  - CBC With Differential - Comprehensive metabolic panel  2. Sickle cell-hemoglobin C disease without crisis (HCC) - folic acid (FOLVITE) 1 MG tablet; Take 1 tablet (1 mg total) by mouth daily.  Dispense: 90 tablet; Refill: 1 - ibuprofen (ADVIL,MOTRIN) 800 MG tablet; Take 1 tablet (800 mg total) by mouth every 8 (eight) hours as needed.  Dispense: 30 tablet; Refill: 0 - morphine (MS CONTIN) 60 MG 12 hr tablet; Take 1 tablet (60 mg total) by mouth every 12 (twelve) hours for 15 days.  Dispense: 30 tablet; Refill: 0 - oxyCODONE (ROXICODONE) 15 MG immediate release tablet; Take 1 tablet (15 mg total) by mouth every 6 (six) hours as needed for up to 15 days for pain.  Dispense: 60 tablet; Refill: 0 - CBC With Differential - Comprehensive metabolic panel - VITAMIN D 25 Hydroxy (Vit-D Deficiency, Fractures)  3. Chronic prescription opiate use - [id number]+OXYCODONE+CRT-UNBUND  4. Mild intermittent asthma without complication - albuterol (PROVENTIL HFA;VENTOLIN HFA) 108 (90 Base) MCG/ACT inhaler; Inhale 2 puffs into the lungs every 6 (six) hours as needed for wheezing or shortness of breath.  Dispense: 1 Inhaler; Refill: 1 - cetirizine (ZYRTEC) 10 MG tablet; Take 1 tablet (10 mg total) by mouth daily as needed for allergies.  Dispense: 30 tablet; Refill: 1  5. Tobacco dependence - nicotine (NICODERM CQ - DOSED IN MG/24 HOURS) 21 mg/24hr patch; Place 1 patch (21 mg total) onto the skin daily.  Dispense: 28 patch; Refill: 0  6. Tobacco abuse counseling - Ambulatory referral to Smoking Cessation Program  7.  Gastroesophageal reflux disease without esophagitis - omeprazole (PRILOSEC) 20 MG capsule; Take 1 capsule (20 mg total) by mouth 2 (two) times daily before a meal.  Dispense: 28 capsule; Refill: 3    Return to care as scheduled and prn. Patient verbalized understanding and agreed with plan of care.   1. Sickle cell disease - We discussed the need for good hydration, monitoring of hydration status, avoidance of heat, cold, stress, and infection triggers. We discussed the risks and benefits of Hydrea, including bone marrow suppression, the possibility of GI upset, skin ulcers, hair thinning, and teratogenicity. The patient was reminded of the need to seek medical  attention of any symptoms of bleeding, anemia, or infection. Continue folic acid 1 mg daily to prevent aplastic bone marrow crises.   2. Pulmonary evaluation - Patient denies severe recurrent wheezes, shortness of breath with exercise, or persistent cough. If these symptoms develop, pulmonary function tests with spirometry will be ordered, and if abnormal, plan on referral to Pulmonology for further evaluation.  3. Cardiac - Routine screening for pulmonary hypertension is not recommended.  4. Eye - High risk of proliferative retinopathy. Annual eye exam with retinal exam recommended to patient.  5. Immunization status -  Yearly influenza vaccination is recommended, as well as being up to date with Meningococcal and Pneumococcal vaccines.   6. Acute and chronic painful episodes - We agreed on MS Contin 60 mg Q12 H and Oxycodone 15 MG PO Q6H prn. We discussed that pt is to receive Schedule II prescriptions only from us. Pt is also aware that the prescription history is available to us online through the [facility] CSRS. Controlled substance agreement signed (Date). We reminded (Pt) that all patients receiving Schedule II narcotics must be seen for follow within one month of prescription being requested. We reviewed the terms of our pain agreement,  including the need to keep medicines in a safe locked location away from children or pets, and the need to report excess sedation or constipation, measures to avoid constipation, and policies related to early refills and stolen prescriptions. According to the [place] Chronic Pain Initiative program, we have reviewed details related to analgesia, adverse effects, aberrant behaviors.  7. 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 Patient not currently being transfused.   8. Vitamin D deficiency - Drisdol 50,000 units weekly. Patient encouraged to take as prescribed.   The above recommendations are taken from the NIH Evidence-Based Management of Sickle Cell Disease: Expert Panel Report, [id number].   Return to care as scheduled and prn. Patient verbalized understanding and agreed with plan of care.   Ms. Freda Jacksonndr L. Riley Lamouglas, FNP-BC [facility] [address]  [place], [place] [address] [phone]  This note has been created with Dragon speech recognition software and smart phrase technology. Any transcriptional errors are unintentional.

## 2017-09-18 ENCOUNTER — Other Ambulatory Visit: Payer: Self-pay

## 2017-09-18 DIAGNOSIS — I10 Essential (primary) hypertension: Secondary | ICD-10-CM

## 2017-09-18 LAB — CBC WITH DIFFERENTIAL
Basophils Absolute: 0.1 10*3/uL (ref 0.0–0.2)
Basos: 1 %
EOS (ABSOLUTE): 0.3 10*3/uL (ref 0.0–0.4)
Eos: 3 %
Hematocrit: 40.6 % (ref 37.5–51.0)
Hemoglobin: 13.5 g/dL (ref 13.0–17.7)
Immature Grans (Abs): 0 10*3/uL (ref 0.0–0.1)
Immature Granulocytes: 0 %
Lymphocytes Absolute: 3.4 10*3/uL — ABNORMAL HIGH (ref 0.7–3.1)
Lymphs: 45 %
MCH: 28.4 pg (ref 26.6–33.0)
MCHC: 33.3 g/dL (ref 31.5–35.7)
MCV: 85 fL (ref 79–97)
Monocytes Absolute: 1.3 10*3/uL — ABNORMAL HIGH (ref 0.1–0.9)
Monocytes: 17 %
NRBC: 4 % — ABNORMAL HIGH (ref 0–0)
Neutrophils Absolute: 2.6 10*3/uL (ref 1.4–7.0)
Neutrophils: 34 %
RBC: 4.76 x10E6/uL (ref 4.14–5.80)
RDW: 18 % — ABNORMAL HIGH (ref 12.3–15.4)
WBC: 7.6 10*3/uL (ref 3.4–10.8)

## 2017-09-18 LAB — COMPREHENSIVE METABOLIC PANEL
ALT: 14 IU/L (ref 0–44)
AST: 28 IU/L (ref 0–40)
Albumin/Globulin Ratio: 1.8 (ref 1.2–2.2)
Albumin: 4.6 g/dL (ref 3.5–5.5)
Alkaline Phosphatase: 97 IU/L (ref 39–117)
BUN/Creatinine Ratio: 7 — ABNORMAL LOW (ref 9–20)
BUN: 7 mg/dL (ref 6–24)
Bilirubin Total: 1.4 mg/dL — ABNORMAL HIGH (ref 0.0–1.2)
CO2: 22 mmol/L (ref 20–29)
Calcium: 10.1 mg/dL (ref 8.7–10.2)
Chloride: 107 mmol/L — ABNORMAL HIGH (ref 96–106)
Creatinine, Ser: 1.04 mg/dL (ref 0.76–1.27)
GFR calc Af Amer: 103 mL/min/{1.73_m2} (ref >59)
GFR calc non Af Amer: 89 mL/min/{1.73_m2} (ref >59)
Globulin, Total: 2.5 g/dL (ref 1.5–4.5)
Glucose: 86 mg/dL (ref 65–99)
Potassium: 4.3 mmol/L (ref 3.5–5.2)
Sodium: 143 mmol/L (ref 134–144)
Total Protein: 7.1 g/dL (ref 6.0–8.5)

## 2017-09-18 LAB — VITAMIN D 25 HYDROXY (VIT D DEFICIENCY, FRACTURES): Vit D, 25-Hydroxy: 15.6 ng/mL — ABNORMAL LOW (ref 30.0–100.0)

## 2017-09-18 MED ORDER — AMLODIPINE BESYLATE 10 MG PO TABS: 10.0000 mg | ORAL_TABLET | Freq: Every day | ORAL | 1 refills | Status: DC

## 2017-09-22 LAB — 737588 9+OXYCODONE+CRT-UNBUND
Amphetamine Scrn, Ur: NEGATIVE ng/mL
BARBITURATE SCREEN URINE: NEGATIVE ng/mL
BENZODIAZEPINE SCREEN, URINE: NEGATIVE ng/mL
Creatinine(Crt), U: 193 mg/dL (ref 20.0–300.0)
Methadone Screen, Urine: NEGATIVE ng/mL
OXYCODONE+OXYMORPHONE UR QL SCN: NEGATIVE ng/mL
Opiate Scrn, Ur: NEGATIVE ng/mL
Ph of Urine: 5.8 (ref 4.5–8.9)
Phencyclidine Qn, Ur: NEGATIVE ng/mL
Propoxyphene Scrn, Ur: NEGATIVE ng/mL

## 2017-09-22 LAB — COCAINE (GC/MS), URINE
BENZOYLECGONINE (GC/MS): 14160 ng/mL
COCAINE + METABOLITE: POSITIVE — AB

## 2017-09-22 LAB — CANNABINOID (GC/MS), URINE
Cannabinoid: POSITIVE — AB
Carboxy THC (GC/MS): 18 ng/mL

## 2017-09-26 NOTE — Telephone Encounter (Signed)
Patient is positive for cocaine on last UDS. He will not be able to receive further pain medication until he has 2 negative UDS. Also no oxy in system.

## 2017-10-19 ENCOUNTER — Ambulatory Visit: Payer: Self-pay | Admitting: Family Medicine

## 2017-10-25 ENCOUNTER — Encounter: Payer: Self-pay | Admitting: Family Medicine

## 2017-10-25 ENCOUNTER — Ambulatory Visit (INDEPENDENT_AMBULATORY_CARE_PROVIDER_SITE_OTHER): Payer: Medicaid Other | Admitting: Family Medicine

## 2017-10-25 VITALS — BP 143/99 | HR 92 | Temp 98.1°F | Resp 16 | Ht 67.0 in | Wt 184.0 lb

## 2017-10-25 DIAGNOSIS — I10 Essential (primary) hypertension: Secondary | ICD-10-CM

## 2017-10-25 DIAGNOSIS — D5720 Sickle-cell/Hb-C disease without crisis: Secondary | ICD-10-CM

## 2017-10-25 DIAGNOSIS — Z23 Encounter for immunization: Secondary | ICD-10-CM

## 2017-10-25 MED ORDER — IBUPROFEN 800 MG PO TABS: 800.0000 mg | ORAL_TABLET | Freq: Three times a day (TID) | ORAL | 0 refills | Status: DC | PRN

## 2017-10-25 MED ORDER — AMLODIPINE BESYLATE 10 MG PO TABS: 10.0000 mg | ORAL_TABLET | Freq: Every day | ORAL | 1 refills | Status: DC

## 2017-10-25 NOTE — Progress Notes (Signed)
[facility]NAL MEDICINE AND SICKLE CELL CARE  SICKLE CELL ANEMIA FOLLOW UP VISIT PROVIDER: Mike Gip, FNP    Subjective:   Dean Webb  is a 42 y.o.  male who  has a past medical history of Asthma, GERD (gastroesophageal reflux disease), Hb-S/Hb-C disease (HCC) (1979), HTN (hypertension) (2008), Hypertension, Sickle cell anemia (HCC), and Sickle cell disease (HCC). presents for a follow up for Sickle Cell Anemia and refill on his medications.   Patient positive for cocaine and negative for oxycodone. Patient was instructed in 08/13/2017 by L. Hollis, NP that he was to have 2 consecutive drug screens prior to having refills.   Patient states that he did not receive amlodipine after the last visit.   Review of Systems  Constitutional: Negative.   HENT: Negative.   Eyes: Negative.   Respiratory: Negative.   Cardiovascular: Negative.   Gastrointestinal: Negative.   Genitourinary: Negative.   Musculoskeletal: Negative.   Skin: Negative.   Neurological: Negative.   Psychiatric/Behavioral: Negative.     Objective:   Objective  BP (!) 143/99 (BP Location: Left Arm, Patient Position: Sitting, Cuff Size: Normal)   Pulse 92   Temp 98.1 F (36.7 C) (Oral)   Resp 16   Ht 5\' 7"  (1.702 m)   Wt 184 lb (83.5 kg)   SpO2 100%   BMI 28.82 kg/m    Physical Exam  Constitutional: He is oriented to person, place, and time. He appears well-developed and well-nourished. No distress.  HENT:  Head: Normocephalic and atraumatic.  Neurological: He is alert and oriented to person, place, and time.     Assessment/Plan:   Assessment   Encounter Diagnoses  Name Primary?  . Essential hypertension   . Flu vaccine need   . 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 Essential hypertension The patient is asked to make an attempt to improve diet and exercise patterns to aid in medical management of this problem.  - amLODipine (NORVASC) 10 MG  tablet; Take 1 tablet (10 mg total) by mouth daily.  Dispense: 90 tablet; Refill: 1  2. Flu vaccine need Influenza vaccination given in the office visit today. - Flu Vaccine QUAD 6+ mos PF IM (Fluarix Quad PF)  3. Sickle cell-hemoglobin C disease without crisis (HCC) No narcotics prescribed. Will return in 2 weeks for UDS. We discussed the UDS in detail and reviewed the results. Patient states "I do not do cocaine like that". Discussed adequate hydration and using ibuprofen and tylenol for pain.  - [id number] 11+Oxyco+Alc+Crt-Bund - CBC with Differential - Comprehensive metabolic panel - ibuprofen (ADVIL,MOTRIN) 800 MG tablet; Take 1 tablet (800 mg total) by mouth every 8 (eight) hours as needed.  Dispense: 30 tablet; Refill: 0   Return to care as scheduled and prn. Patient verbalized understanding and agreed with plan of care.   1. Sickle cell disease -  We discussed the need for good hydration, monitoring of hydration status, avoidance of heat, cold, stress, and infection triggers. We discussed the risks and benefits of Hydrea, including bone marrow suppression, the possibility of GI upset, skin ulcers, hair thinning, and teratogenicity. The patient was reminded of the need to seek medical attention of any symptoms of bleeding, anemia, or infection. Continue folic acid 1 mg daily to prevent aplastic bone marrow crises.   2. Pulmonary evaluation - Patient denies severe recurrent wheezes, shortness of breath with exercise, or persistent cough. If these symptoms develop, pulmonary function  tests with spirometry will be ordered, and if abnormal, plan on referral to Pulmonology for further evaluation.  3. Cardiac - Routine screening for pulmonary hypertension is not recommended.  4. Eye - High risk of proliferative retinopathy. Annual eye exam with retinal exam recommended to patient.  5. Immunization status -  Yearly influenza vaccination is recommended, as well as being up to date with  Meningococcal and Pneumococcal vaccines.   6. Acute and chronic painful episodes - We discussed that pt is to receive Schedule II prescriptions only from us. Pt is also aware that the prescription history is available to us online through the [facility] CSRS. Controlled substance agreement signed . We reminded Dean Webb that all patients receiving Schedule II narcotics must be seen for follow within one month of prescription being requested. We reviewed the terms of our pain agreement, including the need to keep medicines in a safe locked location away from children or pets, and the need to report excess sedation or constipation, measures to avoid constipation, and policies related to early refills and stolen prescriptions. According to the  Chronic Pain Initiative program, we have reviewed details related to analgesia, adverse effects, aberrant behaviors.  7. Iron overload from chronic transfusion.  Not applicable at this time.  If this occurs will use Exjade for management.   8. Vitamin D deficiency - Drisdol 50,000 units weekly. Patient encouraged to take as prescribed.   The above recommendations are taken from the NIH Evidence-Based Management of Sickle Cell Disease: Expert Panel Report, [id number].   Ms. Andr L. Riley Lamouglas, FNP-BC [facility] [address]  [place], [place] [address] [phone]  This note has been created with Dragon speech recognition software and smart phrase technology. Any transcriptional errors are unintentional.

## 2017-10-25 NOTE — Patient Instructions (Signed)
Sickle Cell Anemia, Adult °Sickle cell anemia is a condition where your red blood cells are shaped like sickles. Red blood cells carry oxygen through the body. Sickle-shaped red blood cells do not live as long as normal red blood cells. They also clump together and block blood from flowing through the blood vessels. These things prevent the body from getting enough oxygen. Sickle cell anemia causes organ damage and pain. It also increases the risk of infection. °Follow these instructions at home: °· Drink enough fluid to keep your pee (urine) clear or pale yellow. Drink more in hot weather and during exercise. °· Do not smoke. Smoking lowers oxygen levels in the blood. °· Only take over-the-counter or prescription medicines as told by your doctor. °· Take antibiotic medicines as told by your doctor. Make sure you finish them even if you start to feel better. °· Take supplements as told by your doctor. °· Consider wearing a medical alert bracelet. This tells anyone caring for you in an emergency of your condition. °· When traveling, keep your medical information, doctors' names, and the medicines you take with you at all times. °· If you have a fever, do not take fever medicines right away. This could cover up a problem. Tell your doctor. °· Keep all follow-up visits with your doctor. Sickle cell anemia requires regular medical care. °Contact a doctor if: °You have a fever. °Get help right away if: °· You feel dizzy or faint. °· You have new belly (abdominal) pain, especially on the left side near the stomach area. °· You have a lasting, often uncomfortable and painful erection of the penis (priapism). If it is not treated right away, you will become unable to have sex (impotence). °· You have numbness in your arms or legs or you have a hard time moving them. °· You have a hard time talking. °· You have a fever or lasting symptoms for more than 2-3 days. °· You have a fever and your symptoms suddenly get  worse. °· You have signs or symptoms of infection. These include: °? Chills. °? Being more tired than normal (lethargy). °? Irritability. °? Poor eating. °? Throwing up (vomiting). °· You have pain that is not helped with medicine. °· You have shortness of breath. °· You have pain in your chest. °· You are coughing up pus-like or bloody mucus. °· You have a stiff neck. °· Your feet or hands swell or have pain. °· Your belly looks bloated. °· Your joints hurt. °This information is not intended to replace advice given to you by your health care provider. Make sure you discuss any questions you have with your health care provider. °Document Released: 11/20/2012 Document Revised: 07/08/2015 Document Reviewed: 09/11/2012 °Elsevier Interactive Patient Education © 2017 [employer] ° °

## 2017-10-26 LAB — COMPREHENSIVE METABOLIC PANEL
ALT: 19 IU/L (ref 0–44)
AST: 19 IU/L (ref 0–40)
Albumin/Globulin Ratio: 1.8 (ref 1.2–2.2)
Albumin: 4.6 g/dL (ref 3.5–5.5)
Alkaline Phosphatase: 115 IU/L (ref 39–117)
BUN/Creatinine Ratio: 10 (ref 9–20)
BUN: 13 mg/dL (ref 6–24)
Bilirubin Total: 1.4 mg/dL — ABNORMAL HIGH (ref 0.0–1.2)
CO2: 23 mmol/L (ref 20–29)
Calcium: 10.7 mg/dL — ABNORMAL HIGH (ref 8.7–10.2)
Chloride: 101 mmol/L (ref 96–106)
Creatinine, Ser: 1.25 mg/dL (ref 0.76–1.27)
GFR calc Af Amer: 82 mL/min/{1.73_m2} (ref >59)
GFR calc non Af Amer: 71 mL/min/{1.73_m2} (ref >59)
Globulin, Total: 2.6 g/dL (ref 1.5–4.5)
Glucose: 87 mg/dL (ref 65–99)
Potassium: 4.7 mmol/L (ref 3.5–5.2)
Sodium: 137 mmol/L (ref 134–144)
Total Protein: 7.2 g/dL (ref 6.0–8.5)

## 2017-10-26 LAB — CBC WITH DIFFERENTIAL/PLATELET
Basophils Absolute: 0 10*3/uL (ref 0.0–0.2)
Basos: 0 %
EOS (ABSOLUTE): 0.3 10*3/uL (ref 0.0–0.4)
Eos: 2 %
Hematocrit: 38.6 % (ref 37.5–51.0)
Hemoglobin: 13.5 g/dL (ref 13.0–17.7)
Immature Grans (Abs): 0.1 10*3/uL (ref 0.0–0.1)
Immature Granulocytes: 1 %
Lymphocytes Absolute: 3.4 10*3/uL — ABNORMAL HIGH (ref 0.7–3.1)
Lymphs: 28 %
MCH: 29.5 pg (ref 26.6–33.0)
MCHC: 35 g/dL (ref 31.5–35.7)
MCV: 84 fL (ref 79–97)
Monocytes Absolute: 1.4 10*3/uL — ABNORMAL HIGH (ref 0.1–0.9)
Monocytes: 11 %
Neutrophils Absolute: 7.1 10*3/uL — ABNORMAL HIGH (ref 1.4–7.0)
Neutrophils: 58 %
Platelets: 402 10*3/uL (ref 150–450)
RBC: 4.58 x10E6/uL (ref 4.14–5.80)
RDW: 16.9 % — ABNORMAL HIGH (ref 12.3–15.4)
WBC: 12.4 10*3/uL — ABNORMAL HIGH (ref 3.4–10.8)

## 2017-11-01 ENCOUNTER — Telehealth: Payer: Self-pay | Admitting: Family Medicine

## 2017-11-01 LAB — COCAINE CONF, UR
Benzoylecgonine GC/MS Conf: 3200 ng/mL
Cocaine Metab Quant, Ur: POSITIVE — AB

## 2017-11-01 LAB — CANNABINOID CONFIRMATION, UR
CANNABINOIDS: POSITIVE — AB
Carboxy THC GC/MS Conf: 35 ng/mL

## 2017-11-01 LAB — DRUG SCREEN 764883 11+OXYCO+ALC+CRT-BUND
Amphetamines, Urine: NEGATIVE ng/mL
BENZODIAZ UR QL: NEGATIVE ng/mL
Barbiturate: NEGATIVE ng/mL
Creatinine: 131.3 mg/dL (ref 20.0–300.0)
Ethanol: NEGATIVE %
Meperidine: NEGATIVE ng/mL
Methadone Screen, Urine: NEGATIVE ng/mL
OPIATE SCREEN URINE: NEGATIVE ng/mL
Oxycodone/Oxymorphone, Urine: NEGATIVE ng/mL
Phencyclidine: NEGATIVE ng/mL
Propoxyphene: NEGATIVE ng/mL
Tramadol: NEGATIVE ng/mL
pH, Urine: 6 (ref 4.5–8.9)

## 2017-11-01 NOTE — Telephone Encounter (Signed)
Called patient to inform him of his lab results. No opiates (+) cocaine x 2 . No further narcotic pain meds at this time.

## 2017-11-08 ENCOUNTER — Ambulatory Visit: Payer: Self-pay | Admitting: Family Medicine

## 2018-01-25 ENCOUNTER — Ambulatory Visit: Payer: Self-pay | Admitting: Family Medicine

## 2018-05-17 ENCOUNTER — Other Ambulatory Visit: Payer: Self-pay

## 2018-05-17 ENCOUNTER — Ambulatory Visit: Payer: Medicaid Other | Admitting: Family Medicine

## 2018-06-06 ENCOUNTER — Ambulatory Visit: Payer: Self-pay | Admitting: Family Medicine

## 2018-10-23 ENCOUNTER — Encounter (HOSPITAL_COMMUNITY): Payer: Self-pay

## 2018-10-23 ENCOUNTER — Encounter (HOSPITAL_COMMUNITY): Payer: Self-pay | Admitting: *Deleted

## 2019-01-21 ENCOUNTER — Ambulatory Visit: Payer: Self-pay | Admitting: Family Medicine

## 2019-02-08 IMAGING — CR DG CHEST 2V
2 series · 2 of 2 positions shown · non-contrast
Comparison: April 24, 2017

CLINICAL DATA: Chest pain and shortness of breath

EXAM:
CHEST - 2 VIEW

[w chest pa]
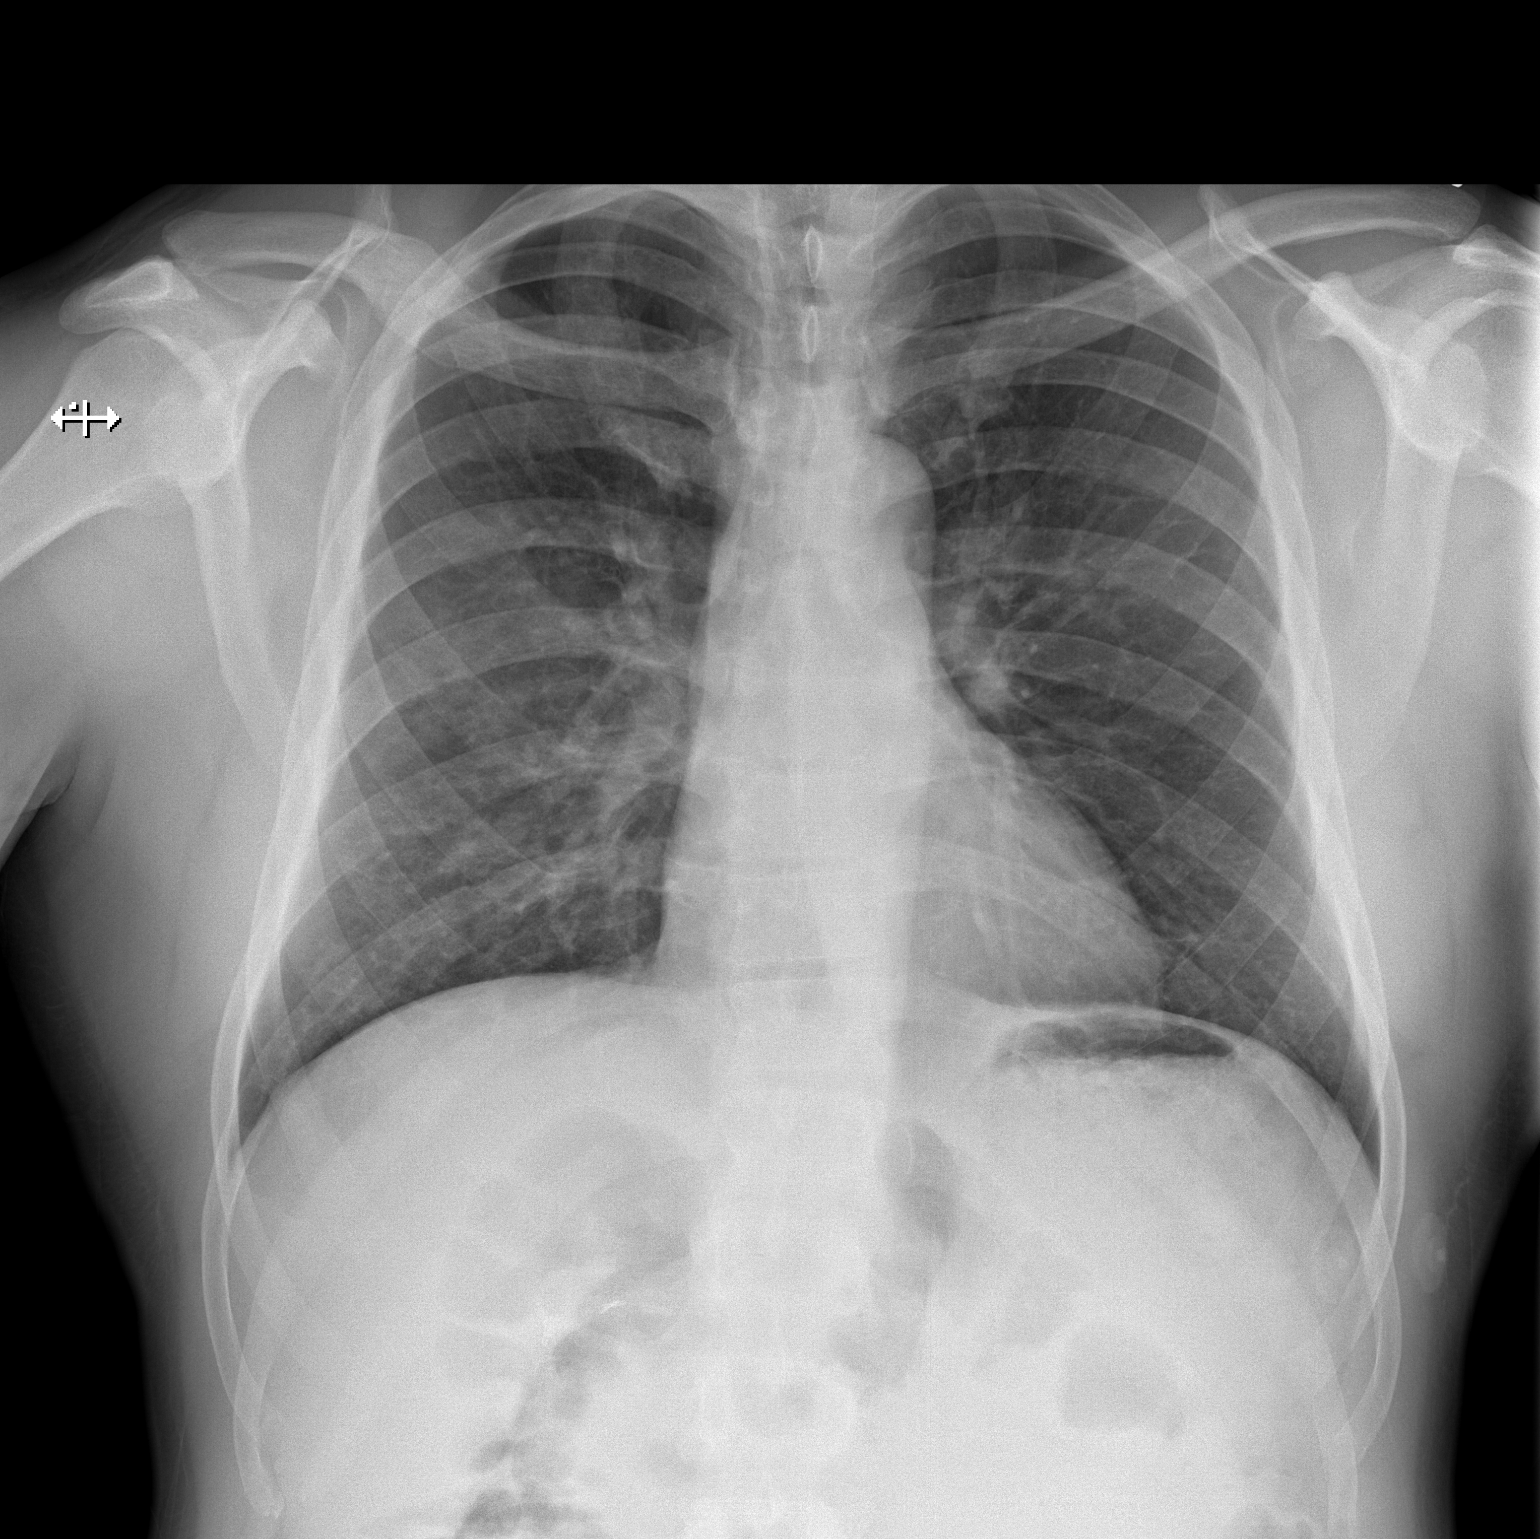

[w chest lat]
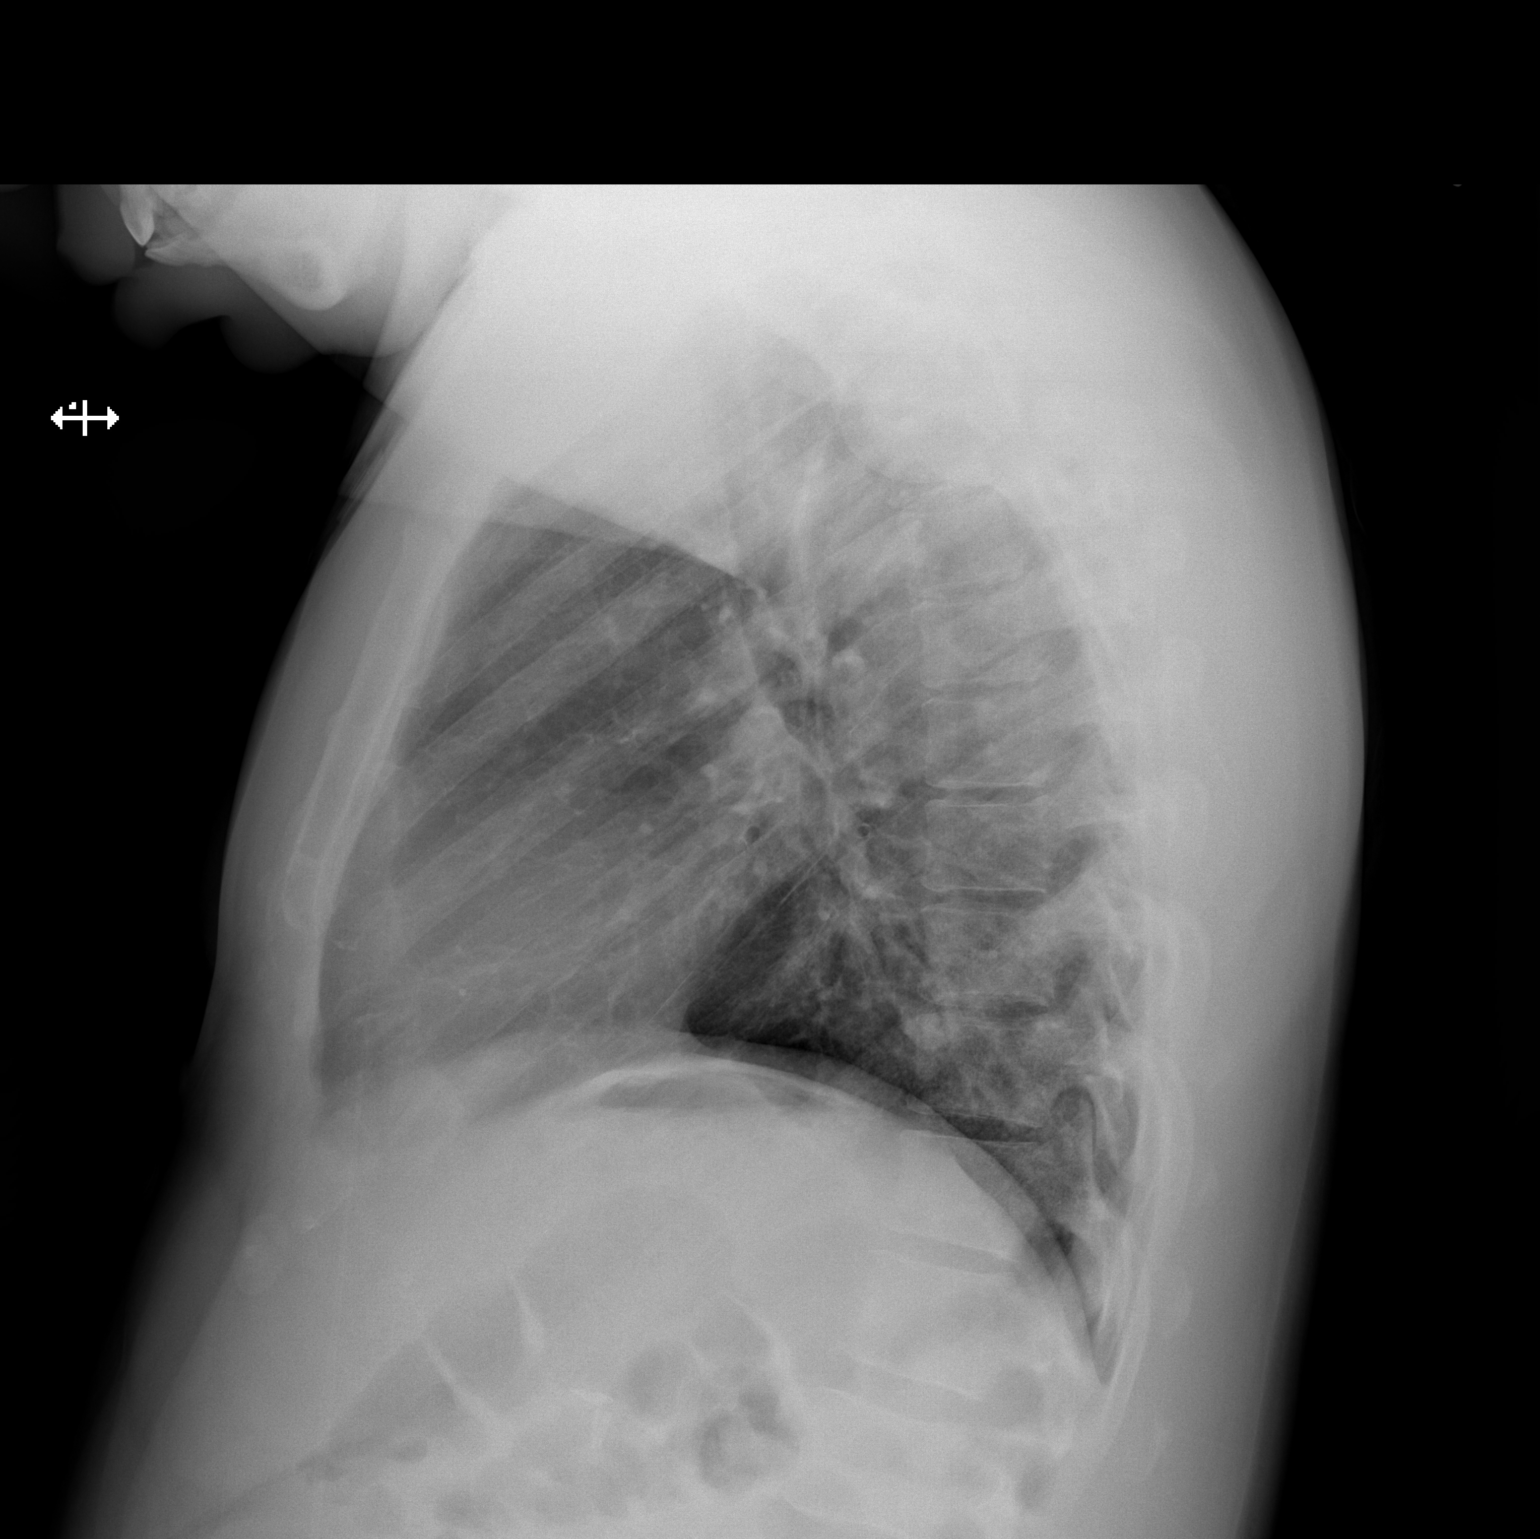

[2 of 2 positions shown; findings below may reference images not displayed]

FINDINGS: The heart size and mediastinal contours are within normal limits.
Both lungs are clear. The visualized skeletal structures are
unremarkable.
IMPRESSION: No active cardiopulmonary disease.

## 2019-02-12 IMAGING — DX DG CHEST 2V
2 series · 2 of 2 positions shown · non-contrast
Comparison: 04/29/2017 CT

CLINICAL DATA: Dyspnea and hypoxia, sickle cell

EXAM:
CHEST - 2 VIEW

[chest pa]
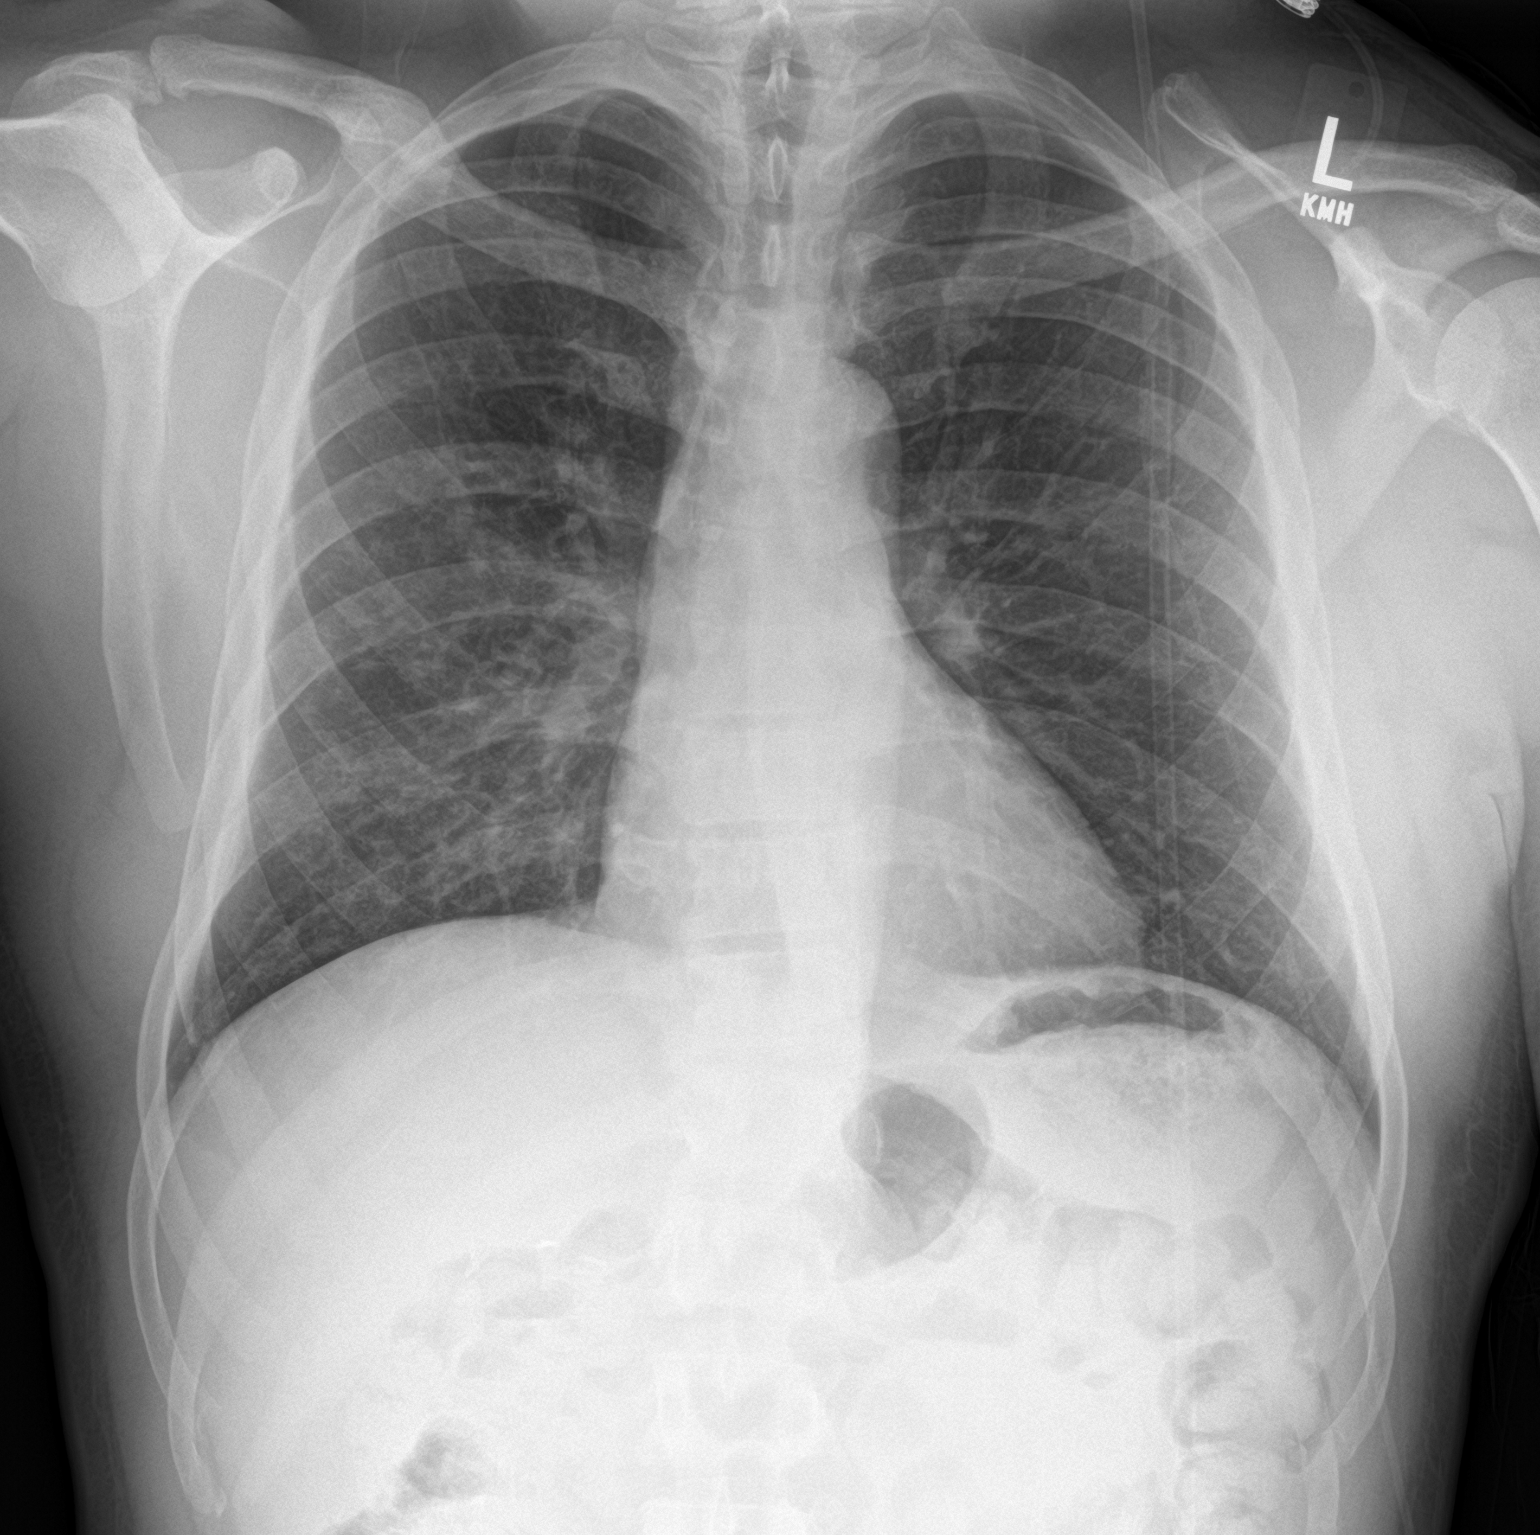

[chest lat]
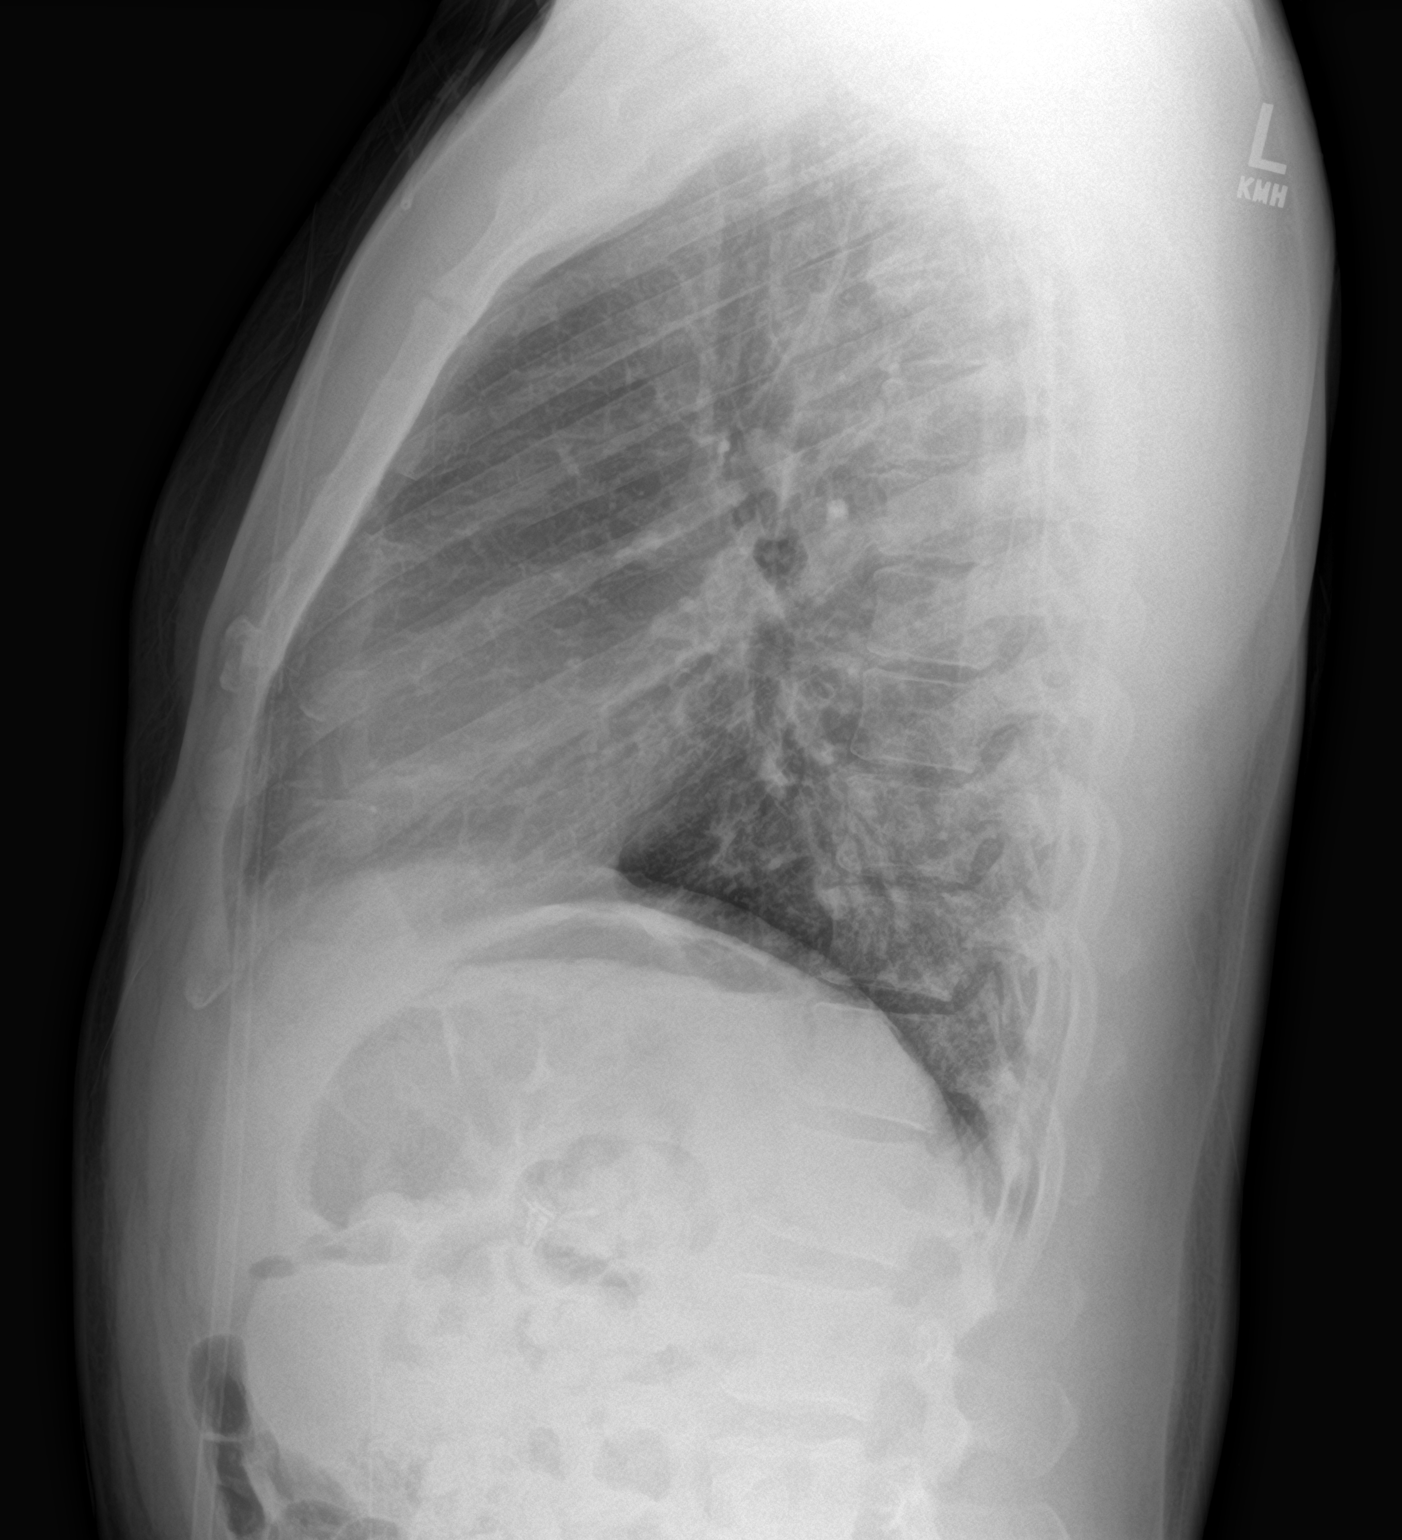

[2 of 2 positions shown; findings below may reference images not displayed]

FINDINGS: The heart size and mediastinal contours are within normal limits.
Faint acinar densities in the right upper, middle and lower lobes as
noted on prior CT are redemonstrated radiographically and compatible
with small foci of bronchopneumonia. The visualized skeletal
structures are unremarkable.
IMPRESSION: Faint acinar opacities in the right lung compatible with small foci
of bronchopneumonia.

## 2019-05-20 IMAGING — CR DG CHEST 2V
2 series · 2 of 2 positions shown · non-contrast
Comparison: 05/03/2017 chest radiograph.

CLINICAL DATA: Chest pain

EXAM:
CHEST - 2 VIEW

[w chest pa]
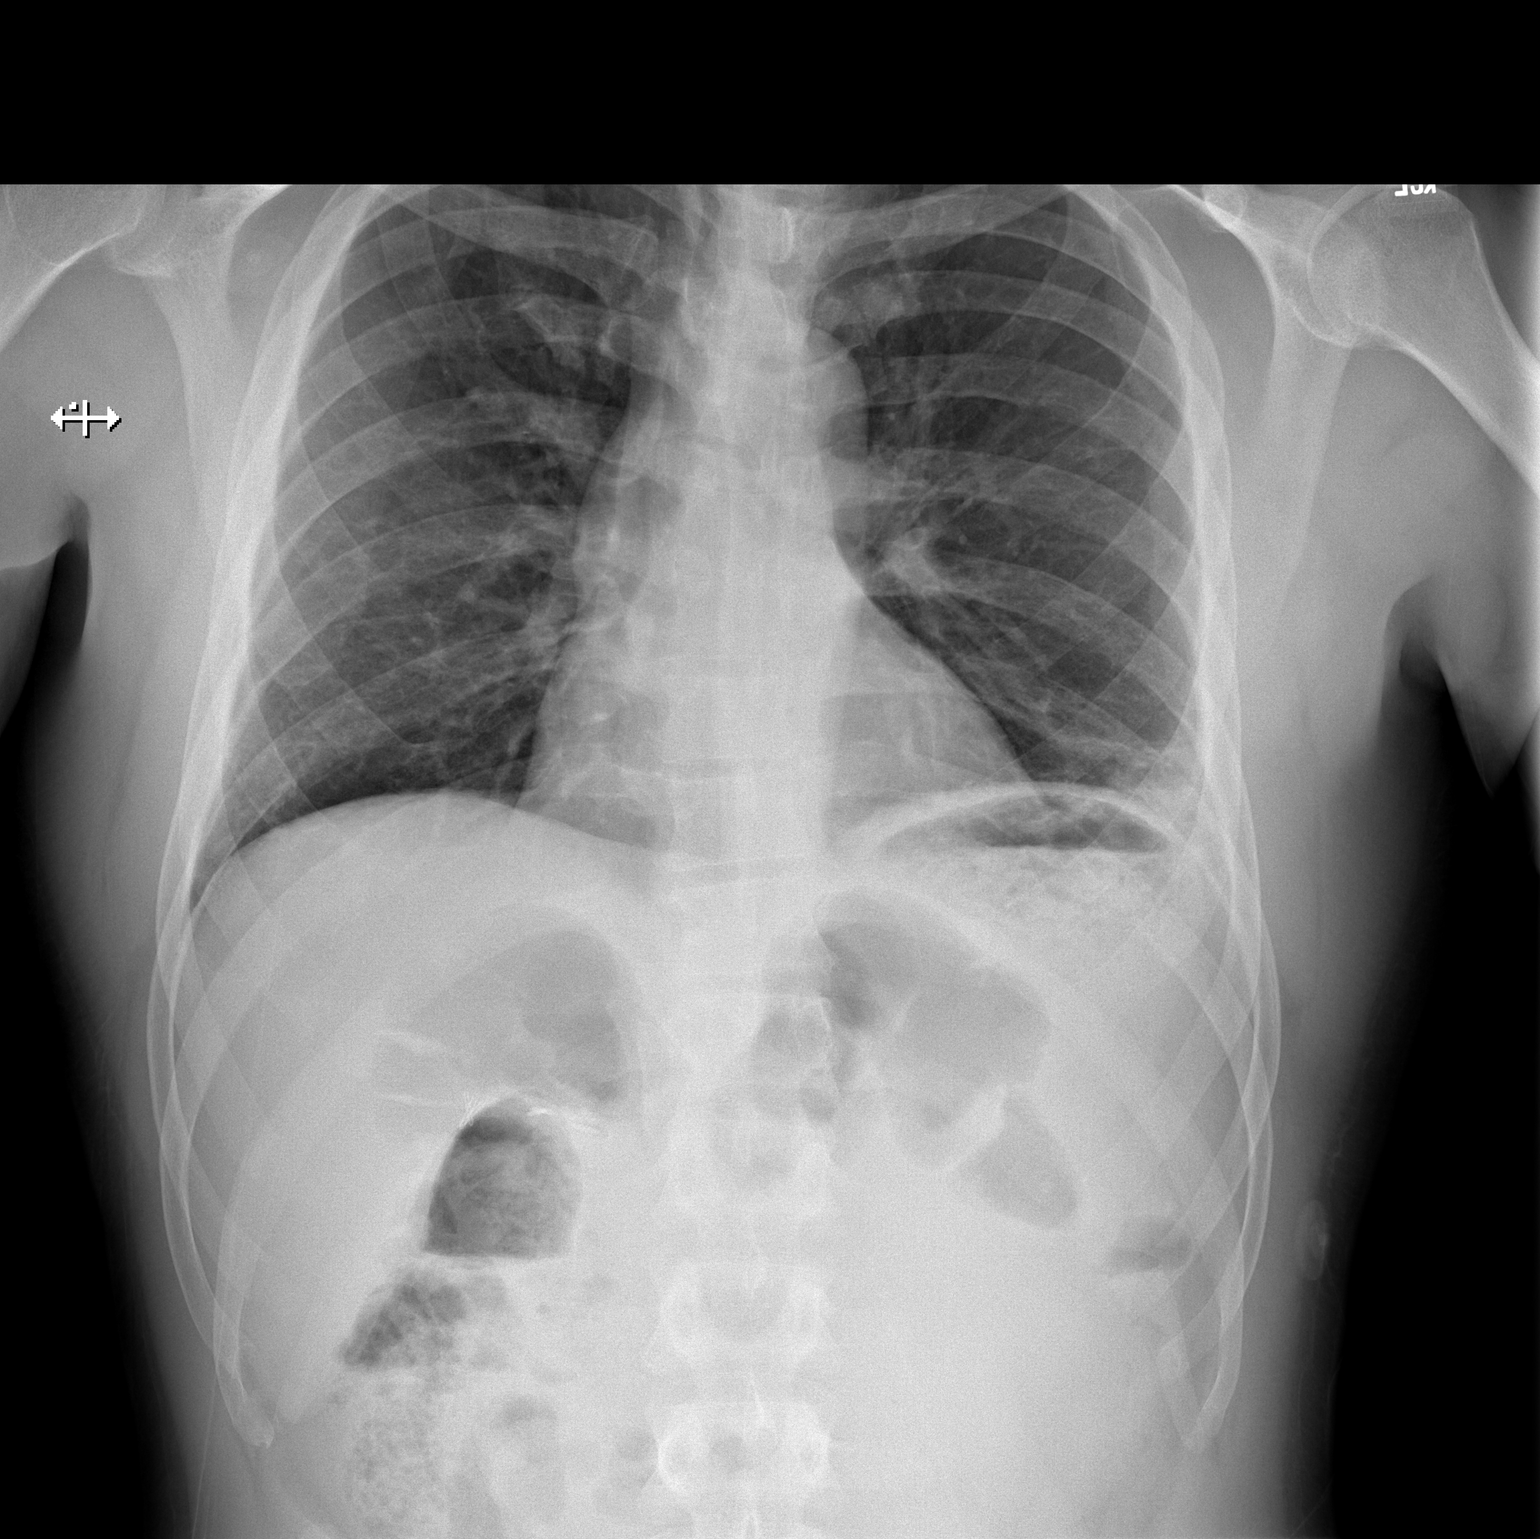

[w chest lat]
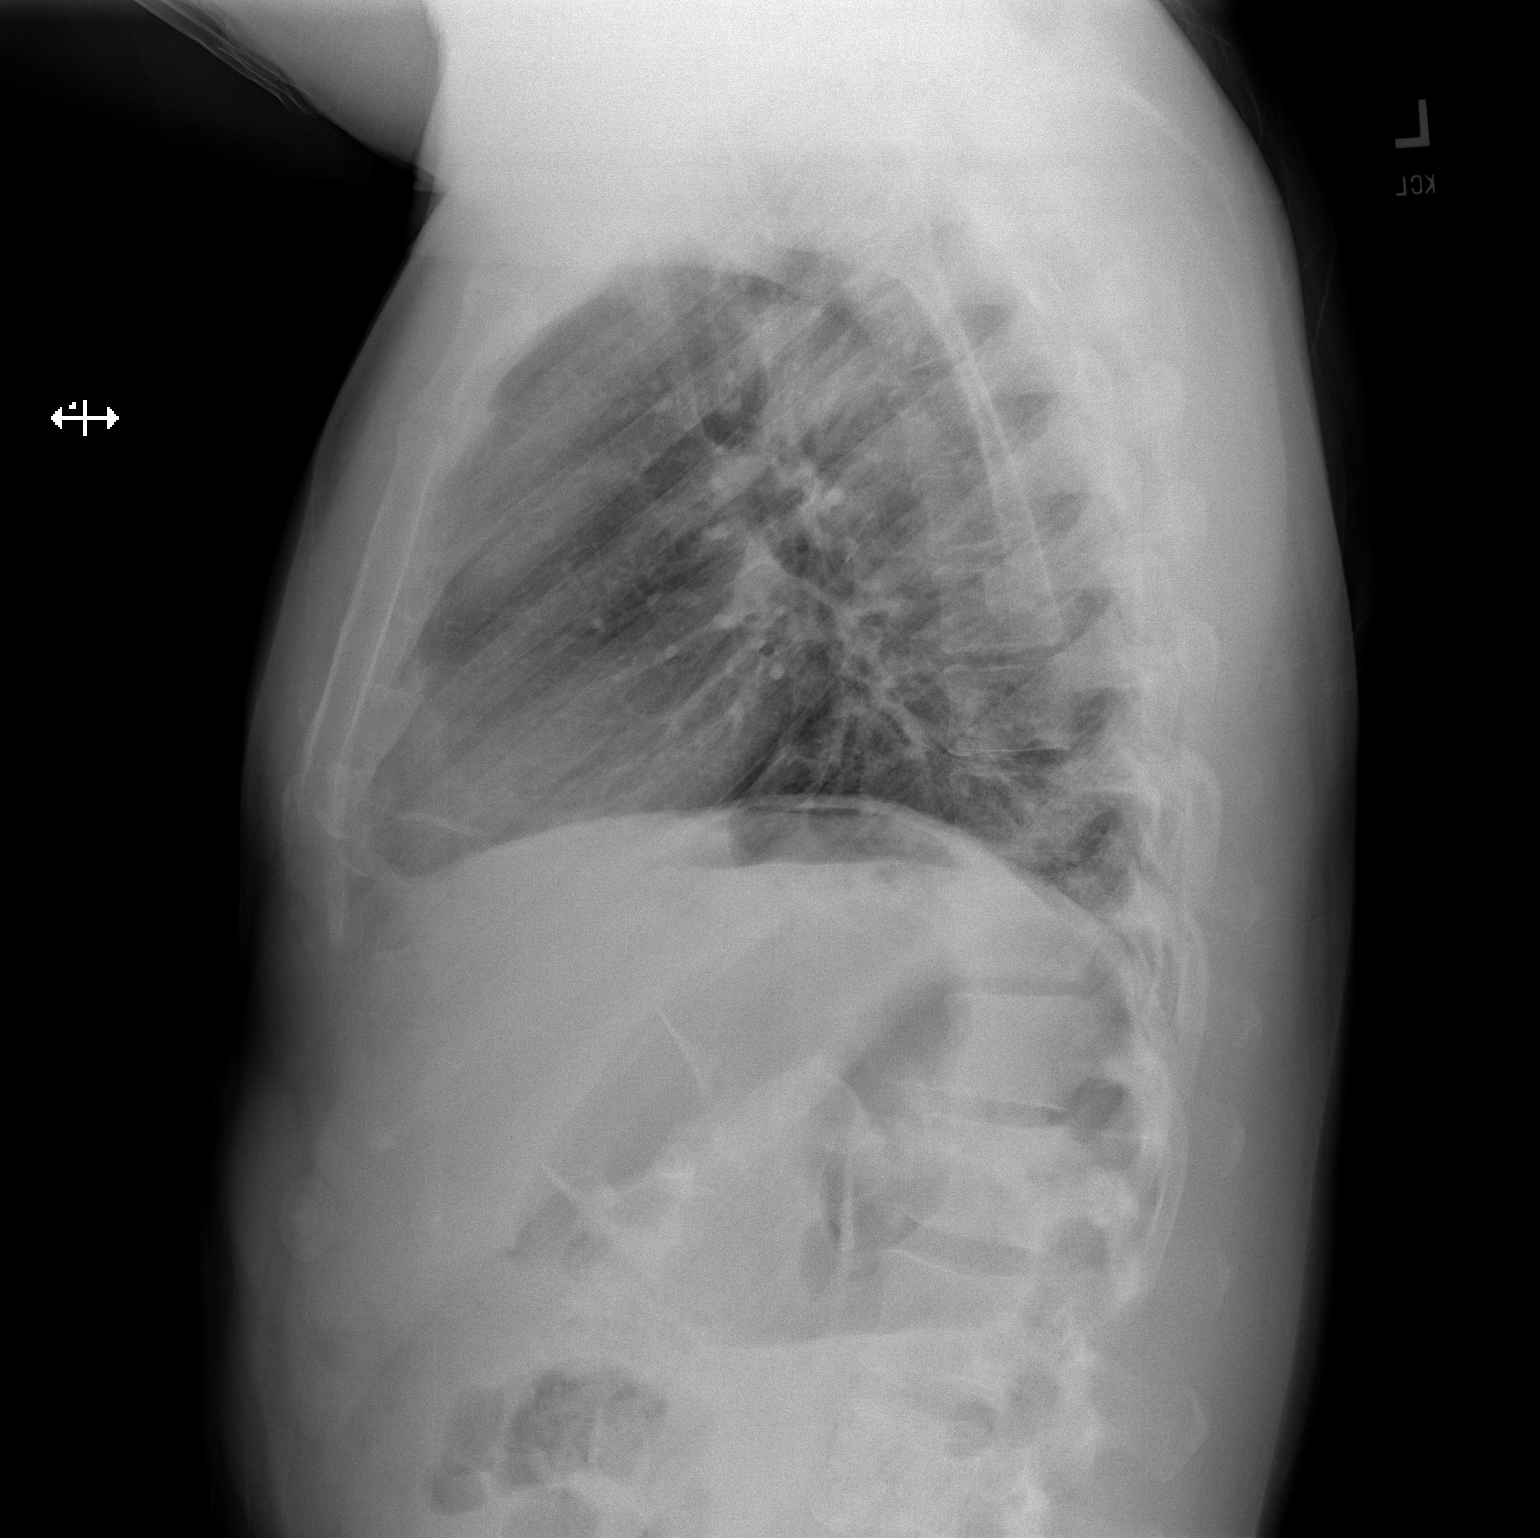

[2 of 2 positions shown; findings below may reference images not displayed]

FINDINGS: Stable cardiomediastinal silhouette with normal heart size. No
pneumothorax. No pleural effusion. Mild patchy opacity at the left
costophrenic angle. No pulmonary edema.
IMPRESSION: Mild patchy opacity at the left costophrenic angle, which could
represent acute chest syndrome, pneumonia or atelectasis. Recommend
follow-up PA and lateral post treatment chest radiographs in 4-6
weeks.

## 2019-09-19 ENCOUNTER — Other Ambulatory Visit: Payer: Self-pay

## 2019-09-19 ENCOUNTER — Encounter: Payer: Self-pay | Admitting: Nurse Practitioner

## 2019-09-19 ENCOUNTER — Ambulatory Visit (INDEPENDENT_AMBULATORY_CARE_PROVIDER_SITE_OTHER): Payer: Medicaid Other | Admitting: Nurse Practitioner

## 2019-09-19 VITALS — BP 152/99 | HR 94 | Temp 98.7°F | Resp 14 | Ht 67.0 in | Wt 205.0 lb

## 2019-09-19 DIAGNOSIS — I10 Essential (primary) hypertension: Secondary | ICD-10-CM

## 2019-09-19 DIAGNOSIS — G4733 Obstructive sleep apnea (adult) (pediatric): Secondary | ICD-10-CM

## 2019-09-19 DIAGNOSIS — F1410 Cocaine abuse, uncomplicated: Secondary | ICD-10-CM

## 2019-09-19 DIAGNOSIS — K219 Gastro-esophageal reflux disease without esophagitis: Secondary | ICD-10-CM

## 2019-09-19 DIAGNOSIS — J4520 Mild intermittent asthma, uncomplicated: Secondary | ICD-10-CM | POA: Diagnosis not present

## 2019-09-19 DIAGNOSIS — D5720 Sickle-cell/Hb-C disease without crisis: Secondary | ICD-10-CM | POA: Diagnosis not present

## 2019-09-19 DIAGNOSIS — Z79891 Long term (current) use of opiate analgesic: Secondary | ICD-10-CM

## 2019-09-19 LAB — POCT URINALYSIS DIPSTICK
Bilirubin, UA: NEGATIVE
Blood, UA: NEGATIVE
Glucose, UA: NEGATIVE
Ketones, UA: NEGATIVE
Leukocytes, UA: NEGATIVE
Nitrite, UA: NEGATIVE
Protein, UA: NEGATIVE
Spec Grav, UA: 1.01 (ref 1.010–1.025)
Urobilinogen, UA: 0.2 E.U./dL
pH, UA: 7 (ref 5.0–8.0)

## 2019-09-19 MED ORDER — FOLIC ACID 1 MG PO TABS: 1.0000 mg | ORAL_TABLET | Freq: Every day | ORAL | 1 refills | Status: DC

## 2019-09-19 MED ORDER — AMLODIPINE BESYLATE 10 MG PO TABS: 10.0000 mg | ORAL_TABLET | Freq: Every day | ORAL | 11 refills | Status: DC

## 2019-09-19 MED ORDER — OMEPRAZOLE 20 MG PO CPDR: 20.0000 mg | DELAYED_RELEASE_CAPSULE | Freq: Two times a day (BID) | ORAL | 3 refills | Status: DC

## 2019-09-19 MED ORDER — ALBUTEROL SULFATE HFA 108 (90 BASE) MCG/ACT IN AERS: 2.0000 | INHALATION_SPRAY | Freq: Four times a day (QID) | RESPIRATORY_TRACT | 1 refills | Status: DC | PRN

## 2019-09-19 NOTE — Progress Notes (Signed)
New Patient Office Visit  Subjective:  Patient ID: Dean Webb, male    DOB: 10-19-1975  Age: 44 y.o. MRN: [id number]  CC:  Chief Complaint  Patient presents with  . Hypertension  . Sickle Cell Anemia  . Hand Injury    right hand     HPI Dean Webb presents to reestablish care.  He has been lost to follow-up for the past 2 years.  He  has a past medical history of Asthma, GERD (gastroesophageal reflux disease), Hb-S/Hb-C disease (HCC) (1979), HTN (hypertension) (2008), Hypertension, Sickle cell anemia (HCC), and Sickle cell disease (HCC).   Hypertension Patient is here for follow-up of elevated blood pressure. He is not exercising and is not adherent to a low-salt diet. Blood pressure is not  at home. Cardiac symptoms: none. Patient denies chest pain, fatigue, irregular heart beat, lower extremity edema, palpitations, paroxysmal nocturnal dyspnea and syncope. Cardiovascular risk factors: male gender, obesity (BMI >= 30 kg/m2) and sedentary lifestyle. Use of agents associated with hypertension: NSAIDS. History of target organ damage: history of acute chest related to sickle cell disease.  He is currently having 7-8/10 leg and arm pain. Denies fever, headache, cough, wheezing, shortness of breath, chest pains, abdominal pain, back pain, hip pain, or leg pain. Denies any open wounds, skin irritation. Last eye exam unknown.  He admits that the pain is what brought him in to get reestablished.  He has a history of cocaine use.  His last UDS in 2019 was positive for cocaine.  He was to have additional urine drug screens that were clear we can prescribe any additional medication.  Patient did not return.  Patient did ask for pain medication.   Past Medical History:  Diagnosis Date  . Asthma   . GERD (gastroesophageal reflux disease)   . Hb-S/Hb-C disease (HCC) 1979  . HTN (hypertension) 2008  . Hypertension   . Sickle cell anemia (HCC)   . Sickle cell disease (HCC)     Past  Surgical History:  Procedure Laterality Date  . CHOLECYSTECTOMY      Family History  Problem Relation Age of Onset  . Sickle cell trait Mother   . Hypertension Mother   . Asthma Mother   . Sickle cell trait Father   . Congestive Heart Failure Father   . Hypertension Sister   . Sickle cell anemia Daughter   . Hypertension Daughter   . Diabetes Daughter     Social History   Socioeconomic History  . Marital status: Single    Spouse name: Not on file  . Number of children: Not on file  . Years of education: Not on file  . Highest education level: Not on file  Occupational History  . Not on file  Tobacco Use  . Smoking status: Current Every Day Smoker    Packs/day: 0.50    Years: 17.00    Pack years: 8.50  . Smokeless tobacco: Never Used  Vaping Use  . Vaping Use: Never used  Substance and Sexual Activity  . Alcohol use: Yes    Comment: Occasional drinker  . Drug use: Not Currently    Types: Marijuana  . Sexual activity: Yes  Other Topics Concern  . Not on file  Social History Narrative  . Not on file   Social Determinants of Health   Financial Resource Strain:   . Difficulty of Paying Living Expenses:   Food Insecurity:   . Worried About Programme researcher, broadcasting/film/video in the  Last Year:   . Ran Out of Food in the Last Year:   Transportation Needs:   . Freight forwarder (Medical):   Marland Kitchen Lack of Transportation (Non-Medical):   Physical Activity:   . Days of Exercise per Week:   . Minutes of Exercise per Session:   Stress:   . Feeling of Stress :   Social Connections:   . Frequency of Communication with Friends and Family:   . Frequency of Social Gatherings with Friends and Family:   . Attends Religious Services:   . Active Member of Clubs or Organizations:   . Attends Banker Meetings:   Marland Kitchen Marital Status:   Intimate Partner Violence:   . Fear of Current or Ex-Partner:   . Emotionally Abused:   Marland Kitchen Physically Abused:   . Sexually Abused:      ROS Review of Systems  Objective:   Today's Vitals: BP (!) 152/99 (BP Location: Left Arm, Patient Position: Sitting, Cuff Size: Large)   Pulse 94   Temp 98.7 F (37.1 C) (Oral)   Resp 14   Ht 5\' 7"  (1.702 m)   Wt 205 lb (93 kg)   SpO2 100%   BMI 32.11 kg/m   Physical Exam Constitutional:      General: He is not in acute distress.    Appearance: He is obese. He is not ill-appearing, toxic-appearing or diaphoretic.  HENT:     Head: Normocephalic and atraumatic.     Nose: Nose normal.     Mouth/Throat:     Mouth: Mucous membranes are moist.  Eyes:     General: Scleral icterus present.  Cardiovascular:     Rate and Rhythm: Normal rate and regular rhythm.     Pulses: Normal pulses.  Pulmonary:     Effort: Pulmonary effort is normal.     Breath sounds: Normal breath sounds.  Abdominal:     General: Bowel sounds are normal.     Palpations: Abdomen is soft.  Musculoskeletal:        General: Normal range of motion.     Cervical back: Normal range of motion.  Skin:    General: Skin is warm.     Capillary Refill: Capillary refill takes less than 2 seconds.  Neurological:     General: No focal deficit present.     Mental Status: He is alert and oriented to person, place, and time.  Psychiatric:        Mood and Affect: Mood normal.        Thought Content: Thought content normal.        Judgment: Judgment normal.     Assessment & Plan:   Problem List Items Addressed This Visit      Cardiovascular and Mediastinum   Essential hypertension - Primary (Chronic) Encouraged on going compliance with current medication regimen Encouraged home monitoring and recording BP <130/80 Eating a heart-healthy diet with less salt Encouraged regular physical activity  Recommend Weight loss      Relevant Medications   amLODipine (NORVASC) 10 MG tablet   Other Relevant Orders   Urinalysis Dipstick (Completed)     Respiratory   Mild asthma without complication  (Chronic) Controlled we will refill medications encourage patient to continue with current regimen and avoid any environmental causes that may result in a flareup   Relevant Medications   albuterol (VENTOLIN HFA) 108 (90 Base) MCG/ACT inhaler   OSA (obstructive sleep apnea) (Chronic) Discussed sleep study and the importance of being compliant  with treatment especially with his sickle cell disease and hypertension.   Relevant Orders   Split night study     Other   Cocaine abuse (HCC) (Chronic) Patient will be monitored closely with frequent urine drug screens    Other Visit Diagnoses    Sickle cell-hemoglobin C disease without crisis (HCC)     We discussed the need for good hydration, monitoring of hydration status, avoidance of heat, cold, stress, and infection triggers.   Relevant Medications   folic acid (FOLVITE) 1 MG tablet   Other Relevant Orders   [id number] 11+Oxyco+Alc+Crt-Bund (Completed)   Sickle Cell Panel (Completed)   Gastroesophageal reflux disease without esophagitis       Relevant Medications   omeprazole (PRILOSEC) 20 MG capsule   Opioid contract exists          Outpatient Encounter Medications as of 09/19/2019  Medication Sig  . albuterol (VENTOLIN HFA) 108 (90 Base) MCG/ACT inhaler Inhale 2 puffs into the lungs every 6 (six) hours as needed for wheezing or shortness of breath.  Marland Kitchen amLODipine (NORVASC) 10 MG tablet Take 1 tablet (10 mg total) by mouth daily.  . cetirizine (ZYRTEC) 10 MG tablet Take 1 tablet (10 mg total) by mouth daily as needed for allergies. (Patient not taking: Reported on 09/19/2019)  . folic acid (FOLVITE) 1 MG tablet Take 1 tablet (1 mg total) by mouth daily.  Marland Kitchen ibuprofen (ADVIL,MOTRIN) 800 MG tablet Take 1 tablet (800 mg total) by mouth every 8 (eight) hours as needed. (Patient not taking: Reported on 09/19/2019)  . nicotine (NICODERM CQ - DOSED IN MG/24 HOURS) 21 mg/24hr patch Place 1 patch (21 mg total) onto the skin daily. (Patient not taking:  Reported on 09/19/2019)  . omeprazole (PRILOSEC) 20 MG capsule Take 1 capsule (20 mg total) by mouth 2 (two) times daily before a meal.  . [DISCONTINUED] albuterol (PROVENTIL HFA;VENTOLIN HFA) 108 (90 Base) MCG/ACT inhaler Inhale 2 puffs into the lungs every 6 (six) hours as needed for wheezing or shortness of breath. (Patient not taking: Reported on 09/19/2019)  . [DISCONTINUED] amLODipine (NORVASC) 10 MG tablet Take 1 tablet (10 mg total) by mouth daily. (Patient not taking: Reported on 09/19/2019)  . [DISCONTINUED] folic acid (FOLVITE) 1 MG tablet Take 1 tablet (1 mg total) by mouth daily. (Patient not taking: Reported on 09/19/2019)  . [DISCONTINUED] omeprazole (PRILOSEC) 20 MG capsule Take 1 capsule (20 mg total) by mouth 2 (two) times daily before a meal. (Patient not taking: Reported on 09/19/2019)   No facility-administered encounter medications on file as of 09/19/2019.    Follow-up: Return in about 2 months (around 11/19/2019).   Barbette Merino, NP

## 2019-09-19 NOTE — Patient Instructions (Signed)

## 2019-09-20 LAB — CMP14+CBC/D/PLT+FER+RETIC+V...
ALT: 26 IU/L (ref 0–44)
AST: 31 IU/L (ref 0–40)
Albumin/Globulin Ratio: 1.6 (ref 1.2–2.2)
Albumin: 4.6 g/dL (ref 4.0–5.0)
Alkaline Phosphatase: 102 IU/L (ref 48–121)
BUN/Creatinine Ratio: 6 — ABNORMAL LOW (ref 9–20)
BUN: 7 mg/dL (ref 6–24)
Basophils Absolute: 0.1 10*3/uL (ref 0.0–0.2)
Basos: 1 %
Bilirubin Total: 1.1 mg/dL (ref 0.0–1.2)
CO2: 23 mmol/L (ref 20–29)
Calcium: 11.3 mg/dL — ABNORMAL HIGH (ref 8.7–10.2)
Chloride: 100 mmol/L (ref 96–106)
Creatinine, Ser: 1.19 mg/dL (ref 0.76–1.27)
EOS (ABSOLUTE): 0.4 10*3/uL (ref 0.0–0.4)
Eos: 4 %
Ferritin: 288 ng/mL (ref 30–400)
GFR calc Af Amer: 86 mL/min/{1.73_m2} (ref >59)
GFR calc non Af Amer: 74 mL/min/{1.73_m2} (ref >59)
Globulin, Total: 2.8 g/dL (ref 1.5–4.5)
Glucose: 90 mg/dL (ref 65–99)
Hematocrit: 40.9 % (ref 37.5–51.0)
Hemoglobin: 14 g/dL (ref 13.0–17.7)
Immature Grans (Abs): 0.1 10*3/uL (ref 0.0–0.1)
Immature Granulocytes: 1 %
Lymphocytes Absolute: 3.5 10*3/uL — ABNORMAL HIGH (ref 0.7–3.1)
Lymphs: 37 %
MCH: 29.5 pg (ref 26.6–33.0)
MCHC: 34.2 g/dL (ref 31.5–35.7)
MCV: 86 fL (ref 79–97)
Monocytes Absolute: 0.9 10*3/uL (ref 0.1–0.9)
Monocytes: 9 %
NRBC: 4 % — ABNORMAL HIGH (ref 0–0)
Neutrophils Absolute: 4.6 10*3/uL (ref 1.4–7.0)
Neutrophils: 48 %
Platelets: 330 10*3/uL (ref 150–450)
Potassium: 4.2 mmol/L (ref 3.5–5.2)
RBC: 4.74 x10E6/uL (ref 4.14–5.80)
RDW: 18 % — ABNORMAL HIGH (ref 11.6–15.4)
Retic Ct Pct: 4.2 % — ABNORMAL HIGH (ref 0.6–2.6)
Sodium: 136 mmol/L (ref 134–144)
Total Protein: 7.4 g/dL (ref 6.0–8.5)
Vit D, 25-Hydroxy: 19.8 ng/mL — ABNORMAL LOW (ref 30.0–100.0)
WBC: 9.5 10*3/uL (ref 3.4–10.8)

## 2019-09-20 LAB — DRUG SCREEN 764883 11+OXYCO+ALC+CRT-BUND
Amphetamines, Urine: NEGATIVE ng/mL
BENZODIAZ UR QL: NEGATIVE ng/mL
Barbiturate: NEGATIVE ng/mL
Cannabinoid Quant, Ur: NEGATIVE ng/mL
Cocaine (Metabolite): NEGATIVE ng/mL
Creatinine: 53.3 mg/dL (ref 20.0–300.0)
Ethanol: NEGATIVE %
Meperidine: NEGATIVE ng/mL
Methadone Screen, Urine: NEGATIVE ng/mL
OPIATE SCREEN URINE: NEGATIVE ng/mL
Oxycodone/Oxymorphone, Urine: NEGATIVE ng/mL
Phencyclidine: NEGATIVE ng/mL
Propoxyphene: NEGATIVE ng/mL
Tramadol: NEGATIVE ng/mL
pH, Urine: 6.9 (ref 4.5–8.9)

## 2019-09-24 ENCOUNTER — Other Ambulatory Visit: Payer: Self-pay | Admitting: Nurse Practitioner

## 2019-09-24 DIAGNOSIS — Z79891 Long term (current) use of opiate analgesic: Secondary | ICD-10-CM

## 2019-09-24 DIAGNOSIS — D571 Sickle-cell disease without crisis: Secondary | ICD-10-CM

## 2019-09-24 MED ORDER — OXYCODONE HCL 10 MG PO TABS: 10.0000 mg | ORAL_TABLET | Freq: Four times a day (QID) | ORAL | 0 refills | Status: DC | PRN

## 2019-09-25 ENCOUNTER — Telehealth: Payer: Self-pay

## 2019-09-25 NOTE — Telephone Encounter (Signed)
Called and made aware that labs are wnl and that oxycodone has been sent to pharmacy. Thanks!

## 2019-09-25 NOTE — Telephone Encounter (Signed)
-----   Message from Barbette Merino, NP sent at 09/24/2019  9:30 PM EDT ----- Please make pt aware that his labs are stable. His UDS was neg.  I will send him in a Rx for Oxycodone 10 mg . He has not been on any medications for over 2 years (so he is considered naive). Thanks

## 2019-09-26 ENCOUNTER — Telehealth: Payer: Self-pay | Admitting: Family Medicine

## 2019-09-26 ENCOUNTER — Other Ambulatory Visit: Payer: Self-pay | Admitting: Family Medicine

## 2019-09-26 DIAGNOSIS — D571 Sickle-cell disease without crisis: Secondary | ICD-10-CM

## 2019-09-26 DIAGNOSIS — Z79891 Long term (current) use of opiate analgesic: Secondary | ICD-10-CM

## 2019-09-26 MED ORDER — OXYCODONE HCL 10 MG PO TABS: 10.0000 mg | ORAL_TABLET | Freq: Four times a day (QID) | ORAL | 0 refills | Status: DC | PRN

## 2019-09-26 NOTE — Telephone Encounter (Signed)
Done

## 2019-09-29 ENCOUNTER — Telehealth: Payer: Self-pay | Admitting: Nurse Practitioner

## 2019-09-29 ENCOUNTER — Other Ambulatory Visit: Payer: Self-pay | Admitting: Nurse Practitioner

## 2019-09-29 DIAGNOSIS — Z79891 Long term (current) use of opiate analgesic: Secondary | ICD-10-CM

## 2019-09-29 DIAGNOSIS — D571 Sickle-cell disease without crisis: Secondary | ICD-10-CM

## 2019-09-29 NOTE — Progress Notes (Unsigned)
    [facility] [address] [place], [place]  [address] Phone:  [phone]   Fax:  [phone] 

## 2019-09-29 NOTE — Telephone Encounter (Signed)
pls contact pt concerning oxyco 10 mg. [facility] does not have dosage in stock

## 2019-09-29 NOTE — Telephone Encounter (Signed)
Called patient lvm for return call to office to confirm correct pharmacy

## 2019-09-29 NOTE — Telephone Encounter (Signed)
Can you please call him and see which pharmacy he would like his medication sent

## 2019-09-30 ENCOUNTER — Telehealth: Payer: Self-pay | Admitting: Nurse Practitioner

## 2019-09-30 NOTE — Telephone Encounter (Signed)
Spoke with patient he has requested that the prescription be sent to [facility] on [address]

## 2019-10-01 ENCOUNTER — Other Ambulatory Visit: Payer: Self-pay | Admitting: Nurse Practitioner

## 2019-10-01 DIAGNOSIS — Z79891 Long term (current) use of opiate analgesic: Secondary | ICD-10-CM

## 2019-10-01 DIAGNOSIS — D571 Sickle-cell disease without crisis: Secondary | ICD-10-CM

## 2019-10-01 MED ORDER — OXYCODONE HCL 10 MG PO TABS: 10.0000 mg | ORAL_TABLET | Freq: Four times a day (QID) | ORAL | 0 refills | Status: AC | PRN

## 2019-10-01 NOTE — Progress Notes (Unsigned)
   [facility] [address] [place], [place]  [address] Phone:  [phone]   Fax:  [phone] 

## 2019-10-01 NOTE — Telephone Encounter (Signed)
sent 

## 2019-10-01 NOTE — Telephone Encounter (Signed)
Done

## 2019-11-18 ENCOUNTER — Ambulatory Visit (HOSPITAL_BASED_OUTPATIENT_CLINIC_OR_DEPARTMENT_OTHER): Payer: Medicaid Other | Attending: Nurse Practitioner | Admitting: Internal Medicine

## 2019-11-19 ENCOUNTER — Encounter: Payer: Self-pay | Admitting: Nurse Practitioner

## 2019-11-19 ENCOUNTER — Other Ambulatory Visit: Payer: Self-pay

## 2019-11-19 ENCOUNTER — Ambulatory Visit (INDEPENDENT_AMBULATORY_CARE_PROVIDER_SITE_OTHER): Payer: Medicaid Other | Admitting: Nurse Practitioner

## 2019-11-19 VITALS — BP 151/94 | HR 88 | Temp 98.1°F | Ht 67.0 in | Wt 198.0 lb

## 2019-11-19 DIAGNOSIS — I10 Essential (primary) hypertension: Secondary | ICD-10-CM

## 2019-11-19 DIAGNOSIS — D571 Sickle-cell disease without crisis: Secondary | ICD-10-CM | POA: Diagnosis not present

## 2019-11-19 DIAGNOSIS — Z13220 Encounter for screening for lipoid disorders: Secondary | ICD-10-CM

## 2019-11-19 LAB — POCT URINALYSIS DIPSTICK
Bilirubin, UA: NEGATIVE
Blood, UA: NEGATIVE
Glucose, UA: NEGATIVE
Ketones, UA: NEGATIVE
Leukocytes, UA: NEGATIVE
Nitrite, UA: NEGATIVE
Protein, UA: NEGATIVE
Spec Grav, UA: 1.015 (ref 1.010–1.025)
Urobilinogen, UA: 0.2 E.U./dL
pH, UA: 6 (ref 5.0–8.0)

## 2019-11-19 MED ORDER — OXYCODONE HCL 10 MG PO TABS
10.0000 mg | ORAL_TABLET | Freq: Four times a day (QID) | ORAL | 0 refills | Status: DC | PRN
Start: 2019-11-19 — End: 2019-12-02

## 2019-11-19 MED ORDER — AMLODIPINE BESYLATE 10 MG PO TABS: 10.0000 mg | ORAL_TABLET | Freq: Every day | ORAL | 11 refills | Status: DC

## 2019-11-19 NOTE — Progress Notes (Signed)
[facility] [address] [place], [place]  [address] Phone:  [phone]   Fax:  [phone]   Established Patient Office Visit  Subjective:  Patient ID: Dean Webb, male    DOB: [birth year]  Age: 44 y.o. MRN: [id number]  CC:  Chief Complaint  Patient presents with   Medication Refill    requesting refills on B/P medications, also wants Oxycodone refill    HPI Dean Webb presents for follow up. He  has a past medical history of Asthma, GERD (gastroesophageal reflux disease), Hb-S/Hb-C disease (HCC) (1979), HTN (hypertension) (2008), Hypertension, Sickle cell anemia (HCC), and Sickle cell disease (HCC).   He has a history of has body soreness. He 7-8/10.  He has a lot of lower back pain. He also has knee and joint pain. He Denies fever, headache, cough, wheezing, shortness of breath, chest pains, abdominal pain, hip pain, or leg pain. Denies any open wounds, skin irritation. Last eye exam was so time ago  Hypertension Patient is here for follow-up of elevated blood pressure. He is exercising and is not adherent to a low-salt diet. Cardiac symptoms: unsure if he has swelling in his legs. Patient denies chest pain, dyspnea, exertional chest pressure/discomfort, fatigue, irregular heart beat, palpitations and syncope. Cardiovascular risk factors: hypertension, male gender and obesity (BMI >= 30 kg/m2). Use of agents associated with hypertension: none. History of target organ damage: none. Past Medical History:  Diagnosis Date   Asthma    GERD (gastroesophageal reflux disease)    Hb-S/Hb-C disease (HCC) 1979   HTN (hypertension) 2008   Hypertension    Sickle cell anemia (HCC)    Sickle cell disease (HCC)     Past Surgical History:  Procedure Laterality Date   CHOLECYSTECTOMY      Family History  Problem Relation Age of Onset   Sickle cell trait Mother    Hypertension Mother    Asthma Mother    Sickle cell trait Father     Congestive Heart Failure Father    Hypertension Sister    Sickle cell anemia Daughter    Hypertension Daughter    Diabetes Daughter     Social History   Socioeconomic History   Marital status: Single    Spouse name: Not on file   Number of children: Not on file   Years of education: Not on file   Highest education level: Not on file  Occupational History   Not on file  Tobacco Use   Smoking status: Current Every Day Smoker    Packs/day: 0.50    Years: 17.00    Pack years: 8.50   Smokeless tobacco: Never Used  Building services engineer Use: Never used  Substance and Sexual Activity   Alcohol use: Yes    Comment: Occasional drinker   Drug use: Not Currently    Types: Marijuana   Sexual activity: Yes  Other Topics Concern   Not on file  Social History Narrative   Not on file   Social Determinants of Health   Financial Resource Strain:    Difficulty of Paying Living Expenses: Not on file  Food Insecurity:    Worried About Running Out of Food in the Last Year: Not on file   [employer] of Food in the Last Year: Not on file  Transportation Needs:    Lack of Transportation (Medical): Not on file   Lack of Transportation (Non-Medical): Not on file  Physical Activity:  Days of Exercise per Week: Not on file   Minutes of Exercise per Session: Not on file  Stress:    Feeling of Stress : Not on file  Social Connections:    Frequency of Communication with Friends and Family: Not on file   Frequency of Social Gatherings with Friends and Family: Not on file   Attends Religious Services: Not on file   Active Member of Clubs or Organizations: Not on file   Attends Banker Meetings: Not on file   Marital Status: Not on file  Intimate Partner Violence:    Fear of Current or Ex-Partner: Not on file   Emotionally Abused: Not on file   Physically Abused: Not on file   Sexually Abused: Not on file    Outpatient Medications Prior to  Visit  Medication Sig Dispense Refill   folic acid (FOLVITE) 1 MG tablet Take 1 tablet (1 mg total) by mouth daily. 90 tablet 1   omeprazole (PRILOSEC) 20 MG capsule Take 1 capsule (20 mg total) by mouth 2 (two) times daily before a meal. 28 capsule 3   amLODipine (NORVASC) 10 MG tablet Take 1 tablet (10 mg total) by mouth daily. 30 tablet 11   albuterol (VENTOLIN HFA) 108 (90 Base) MCG/ACT inhaler Inhale 2 puffs into the lungs every 6 (six) hours as needed for wheezing or shortness of breath. (Patient not taking: Reported on 11/19/2019) 8 g 1   cetirizine (ZYRTEC) 10 MG tablet Take 1 tablet (10 mg total) by mouth daily as needed for allergies. (Patient not taking: Reported on 09/19/2019) 30 tablet 1   ibuprofen (ADVIL,MOTRIN) 800 MG tablet Take 1 tablet (800 mg total) by mouth every 8 (eight) hours as needed. (Patient not taking: Reported on 09/19/2019) 30 tablet 0   nicotine (NICODERM CQ - DOSED IN MG/24 HOURS) 21 mg/24hr patch Place 1 patch (21 mg total) onto the skin daily. (Patient not taking: Reported on 09/19/2019) 28 patch 0   No facility-administered medications prior to visit.    No Known Allergies  ROS Review of Systems  Constitutional: Negative for chills and fever.  HENT: Negative.   Eyes: Negative.   Respiratory: Negative.   Cardiovascular: Negative.   Gastrointestinal: Negative.  Negative for constipation and diarrhea.  Endocrine: Negative.   Genitourinary: Negative.   Musculoskeletal: Positive for back pain.  Skin: Negative.   Allergic/Immunologic: Negative.   Neurological: Negative.   Hematological: Negative.       Objective:    Physical Exam Constitutional:      General: He is not in acute distress.    Appearance: He is not ill-appearing, toxic-appearing or diaphoretic.  HENT:     Head: Normocephalic.     Nose: Nose normal.     Mouth/Throat:     Mouth: Mucous membranes are moist.  Cardiovascular:     Rate and Rhythm: Normal rate and regular rhythm.      Pulses: Normal pulses.     Heart sounds: Normal heart sounds.  Pulmonary:     Effort: Pulmonary effort is normal.     Breath sounds: Normal breath sounds.  Abdominal:     Palpations: Abdomen is soft.  Musculoskeletal:     Cervical back: Normal range of motion.     Right lower leg: Edema (nonpitting) present.     Left lower leg: Edema present.  Skin:    General: Skin is warm and dry.     Capillary Refill: Capillary refill takes less than 2 seconds.  Neurological:  General: No focal deficit present.     Mental Status: He is alert and oriented to person, place, and time.  Psychiatric:        Mood and Affect: Mood normal.        Behavior: Behavior normal.        Thought Content: Thought content normal.        Judgment: Judgment normal.     BP (!) 151/94    Pulse 88    Temp 98.1 F (36.7 C) (Temporal)    Ht 5\' 7"  (1.702 m)    Wt 198 lb (89.8 kg)    SpO2 99%    BMI 31.01 kg/m  Wt Readings from Last 3 Encounters:  11/19/19 198 lb (89.8 kg)  09/19/19 205 lb (93 kg)  10/25/17 184 lb (83.5 kg)     There are no preventive care reminders to display for this patient.  There are no preventive care reminders to display for this patient.  No results found for: TSH Lab Results  Component Value Date   WBC 9.5 09/19/2019   HGB 14.0 09/19/2019   HCT 40.9 09/19/2019   MCV 86 09/19/2019   PLT 330 09/19/2019   Lab Results  Component Value Date   NA 136 09/19/2019   K 4.2 09/19/2019   CO2 23 09/19/2019   GLUCOSE 90 09/19/2019   BUN 7 09/19/2019   CREATININE 1.19 09/19/2019   BILITOT 1.1 09/19/2019   ALKPHOS 102 09/19/2019   AST 31 09/19/2019   ALT 26 09/19/2019   PROT 7.4 09/19/2019   ALBUMIN 4.6 09/19/2019   CALCIUM 11.3 (H) 09/19/2019   ANIONGAP 7 08/11/2017   Lab Results  Component Value Date   CHOL  11/06/2008    84        ATP III CLASSIFICATION:  <200     mg/dL   Desirable  11/08/2008  mg/dL   Borderline High  102-585    mg/dL   High          Lab Results    Component Value Date   HDL 31 (L) 11/06/2008   Lab Results  Component Value Date   [facility]  11/06/2008    44        Total Cholesterol/HDL:CHD Risk Coronary Heart Disease Risk Table                     Men   Women  1/2 Average Risk   3.4   3.3  Average Risk       5.0   4.4  2 X Average Risk   9.6   7.1  3 X Average Risk  23.4   11.0        Use the calculated Patient Ratio above and the CHD Risk Table to determine the patient's CHD Risk.        ATP III CLASSIFICATION (LDL):  <100     mg/dL   Optimal  11/08/2008  mg/dL   Near or Above                    Optimal  130-159  mg/dL   Borderline  824-235  mg/dL   High  361-443     mg/dL   Very High   Lab Results  Component Value Date   TRIG 44 11/06/2008   Lab Results  Component Value Date   CHOLHDL 2.7 11/06/2008   No results found for: HGBA1C    Assessment & Plan:  Problem List Items Addressed This Visit      Cardiovascular and Mediastinum   Essential hypertension (Chronic) Encouraged on going compliance with current medication regimen Encouraged home monitoring and recording BP <130/80 Eating a heart-healthy diet with less salt Encouraged regular physical activity  Recommend Weight loss    Relevant Medications   amLODipine (NORVASC) 10 MG tablet   Other Relevant Orders   POCT urinalysis dipstick    Other Visit Diagnoses    Hb-SS disease without crisis (HCC)    -  Primary Ensure adequate hydration. Move frequently to reduce venous thromboembolism risk. Avoid situations that could lead to dehydration or could exacerbate pain Discussed S&S of infection, seizures, stroke acute chest, DVT and how important it is to seek medical attention Take medication as directed along with pain contract and overall compliance Discussed the risk related to opiate use (addition, tolerance and dependency)     Relevant Orders   [id number] 11+Oxyco+Alc+Crt-Bund   Sickle Cell Panel   Screening for cholesterol level       Relevant  Orders   Lipid panel      Meds ordered this encounter  Medications   amLODipine (NORVASC) 10 MG tablet    Sig: Take 1 tablet (10 mg total) by mouth daily.    Dispense:  30 tablet    Refill:  11   Oxycodone HCl 10 MG TABS    Sig: Take 1 tablet (10 mg total) by mouth every 6 (six) hours as needed for up to 15 days.    Dispense:  60 tablet    Refill:  0    Order Specific Question:   Supervising Provider    Answer:   Quentin AngstJEGEDE, OLUGBEMIGA E [[id number]]    Follow-up: Return in about 2 months (around 01/19/2020).    Barbette Merinorystal M Haedyn Breau, NP

## 2019-11-19 NOTE — Patient Instructions (Signed)
   Managing Your Hypertension Hypertension is commonly called high blood pressure. This is when the force of your blood pressing against the walls of your arteries is too strong. Arteries are blood vessels that carry blood from your heart throughout your body. Hypertension forces the heart to work harder to pump blood, and may cause the arteries to become narrow or stiff. Having untreated or uncontrolled hypertension can cause heart attack, stroke, kidney disease, and other problems. What are blood pressure readings? A blood pressure reading consists of a higher number over a lower number. Ideally, your blood pressure should be below 120/80. The first ("top") number is called the systolic pressure. It is a measure of the pressure in your arteries as your heart beats. The second ("bottom") number is called the diastolic pressure. It is a measure of the pressure in your arteries as the heart relaxes. What does my blood pressure reading mean? Blood pressure is classified into four stages. Based on your blood pressure reading, your health care provider may use the following stages to determine what type of treatment you need, if any. Systolic pressure and diastolic pressure are measured in a unit called mm Hg. Normal  Systolic pressure: below 120.  Diastolic pressure: below 80. Elevated  Systolic pressure: 120-129.  Diastolic pressure: below 80. Hypertension stage 1  Systolic pressure: 130-139.  Diastolic pressure: 80-89. Hypertension stage 2  Systolic pressure: 140 or above.  Diastolic pressure: 90 or above. What health risks are associated with hypertension? Managing your hypertension is an important responsibility. Uncontrolled hypertension can lead to:  A heart attack.  A stroke.  A weakened blood vessel (aneurysm).  Heart failure.  Kidney damage.  Eye damage.  Metabolic syndrome.  Memory and concentration problems. What changes can I make to manage my  hypertension? Hypertension can be managed by making lifestyle changes and possibly by taking medicines. Your health care provider will help you make a plan to bring your blood pressure within a normal range. Eating and drinking   Eat a diet that is high in fiber and potassium, and low in salt (sodium), added sugar, and fat. An example eating plan is called the DASH (Dietary Approaches to Stop Hypertension) diet. To eat this way: ? Eat plenty of fresh fruits and vegetables. Try to fill half of your plate at each meal with fruits and vegetables. ? Eat whole grains, such as whole wheat pasta, brown rice, or whole grain bread. Fill about one quarter of your plate with whole grains. ? Eat low-fat diary products. ? Avoid fatty cuts of meat, processed or cured meats, and poultry with skin. Fill about one quarter of your plate with lean proteins such as fish, chicken without skin, beans, eggs, and tofu. ? Avoid premade and processed foods. These tend to be higher in sodium, added sugar, and fat.  Reduce your daily sodium intake. Most people with hypertension should eat less than 1,500 mg of sodium a day.  Limit alcohol intake to no more than 1 drink a day for nonpregnant women and 2 drinks a day for men. One drink equals 12 oz of beer, 5 oz of wine, or 1 oz of hard liquor. Lifestyle  Work with your health care provider to maintain a healthy body weight, or to lose weight. Ask what an ideal weight is for you.  Get at least 30 minutes of exercise that causes your heart to beat faster (aerobic exercise) most days of the week. Activities may include walking, swimming, or biking.    Include exercise to strengthen your muscles (resistance exercise), such as weight lifting, as part of your weekly exercise routine. Try to do these types of exercises for 30 minutes at least 3 days a week.  Do not use any products that contain nicotine or tobacco, such as cigarettes and e-cigarettes. If you need help quitting,  ask your health care provider.  Control any long-term (chronic) conditions you have, such as high cholesterol or diabetes. Monitoring  Monitor your blood pressure at home as told by your health care provider. Your personal target blood pressure may vary depending on your medical conditions, your age, and other factors.  Have your blood pressure checked regularly, as often as told by your health care provider. Working with your health care provider  Review all the medicines you take with your health care provider because there may be side effects or interactions.  Talk with your health care provider about your diet, exercise habits, and other lifestyle factors that may be contributing to hypertension.  Visit your health care provider regularly. Your health care provider can help you create and adjust your plan for managing hypertension. Will I need medicine to control my blood pressure? Your health care provider may prescribe medicine if lifestyle changes are not enough to get your blood pressure under control, and if:  Your systolic blood pressure is 130 or higher.  Your diastolic blood pressure is 80 or higher. Take medicines only as told by your health care provider. Follow the directions carefully. Blood pressure medicines must be taken as prescribed. The medicine does not work as well when you skip doses. Skipping doses also puts you at risk for problems. Contact a health care provider if:  You think you are having a reaction to medicines you have taken.  You have repeated (recurrent) headaches.  You feel dizzy.  You have swelling in your ankles.  You have trouble with your vision. Get help right away if:  You develop a severe headache or confusion.  You have unusual weakness or numbness, or you feel faint.  You have severe pain in your chest or abdomen.  You vomit repeatedly.  You have trouble breathing. Summary  Hypertension is when the force of blood pumping  through your arteries is too strong. If this condition is not controlled, it may put you at risk for serious complications.  Your personal target blood pressure may vary depending on your medical conditions, your age, and other factors. For most people, a normal blood pressure is less than 120/80.  Hypertension is managed by lifestyle changes, medicines, or both. Lifestyle changes include weight loss, eating a healthy, low-sodium diet, exercising more, and limiting alcohol. This information is not intended to replace advice given to you by your health care provider. Make sure you discuss any questions you have with your health care provider. Document Revised: 05/24/2018 Document Reviewed: 12/29/2015 Elsevier Patient Education  2020 [employer]  

## 2019-11-21 LAB — DRUG SCREEN 764883 11+OXYCO+ALC+CRT-BUND
Amphetamines, Urine: NEGATIVE ng/mL
BENZODIAZ UR QL: NEGATIVE ng/mL
Barbiturate: NEGATIVE ng/mL
Cannabinoid Quant, Ur: NEGATIVE ng/mL
Cocaine (Metabolite): NEGATIVE ng/mL
Creatinine: 66.3 mg/dL (ref 20.0–300.0)
Ethanol: NEGATIVE %
Meperidine: NEGATIVE ng/mL
Methadone Screen, Urine: NEGATIVE ng/mL
OPIATE SCREEN URINE: NEGATIVE ng/mL
Oxycodone/Oxymorphone, Urine: NEGATIVE ng/mL
Phencyclidine: NEGATIVE ng/mL
Propoxyphene: NEGATIVE ng/mL
Tramadol: NEGATIVE ng/mL
pH, Urine: 6.9 (ref 4.5–8.9)

## 2019-11-26 ENCOUNTER — Encounter (HOSPITAL_COMMUNITY): Payer: Self-pay | Admitting: Emergency Medicine

## 2019-11-26 ENCOUNTER — Emergency Department (HOSPITAL_COMMUNITY): Payer: Medicaid Other

## 2019-11-26 ENCOUNTER — Other Ambulatory Visit: Payer: Self-pay

## 2019-11-26 ENCOUNTER — Encounter (HOSPITAL_COMMUNITY): Payer: Self-pay

## 2019-11-26 ENCOUNTER — Inpatient Hospital Stay (HOSPITAL_COMMUNITY)
Admission: EM | Admit: 2019-11-26 | Discharge: 2019-12-02 | DRG: 812 | Disposition: A | Discharge status: Home / Self Care | Payer: Medicaid Other | Attending: Internal Medicine | Admitting: Internal Medicine

## 2019-11-26 ENCOUNTER — Ambulatory Visit (HOSPITAL_COMMUNITY)
Admission: EM | Admit: 2019-11-26 | Discharge: 2019-11-26 | Disposition: A | Discharge status: Home / Self Care | Payer: Medicaid Other | Attending: Family Medicine | Admitting: Family Medicine

## 2019-11-26 DIAGNOSIS — J45909 Unspecified asthma, uncomplicated: Secondary | ICD-10-CM | POA: Diagnosis present

## 2019-11-26 DIAGNOSIS — D5700 Hb-SS disease with crisis, unspecified: Secondary | ICD-10-CM | POA: Diagnosis present

## 2019-11-26 DIAGNOSIS — Z20822 Contact with and (suspected) exposure to covid-19: Secondary | ICD-10-CM | POA: Diagnosis present

## 2019-11-26 DIAGNOSIS — Z825 Family history of asthma and other chronic lower respiratory diseases: Secondary | ICD-10-CM

## 2019-11-26 DIAGNOSIS — Z9049 Acquired absence of other specified parts of digestive tract: Secondary | ICD-10-CM

## 2019-11-26 DIAGNOSIS — D5701 Hb-SS disease with acute chest syndrome: Secondary | ICD-10-CM | POA: Diagnosis not present

## 2019-11-26 DIAGNOSIS — K219 Gastro-esophageal reflux disease without esophagitis: Secondary | ICD-10-CM | POA: Diagnosis present

## 2019-11-26 DIAGNOSIS — Z832 Family history of diseases of the blood and blood-forming organs and certain disorders involving the immune mechanism: Secondary | ICD-10-CM

## 2019-11-26 DIAGNOSIS — G4733 Obstructive sleep apnea (adult) (pediatric): Secondary | ICD-10-CM | POA: Diagnosis present

## 2019-11-26 DIAGNOSIS — Z79899 Other long term (current) drug therapy: Secondary | ICD-10-CM

## 2019-11-26 DIAGNOSIS — R509 Fever, unspecified: Secondary | ICD-10-CM

## 2019-11-26 DIAGNOSIS — D5720 Sickle-cell/Hb-C disease without crisis: Secondary | ICD-10-CM

## 2019-11-26 DIAGNOSIS — R1011 Right upper quadrant pain: Secondary | ICD-10-CM | POA: Diagnosis not present

## 2019-11-26 DIAGNOSIS — Z833 Family history of diabetes mellitus: Secondary | ICD-10-CM

## 2019-11-26 DIAGNOSIS — Z79891 Long term (current) use of opiate analgesic: Secondary | ICD-10-CM

## 2019-11-26 DIAGNOSIS — F17200 Nicotine dependence, unspecified, uncomplicated: Secondary | ICD-10-CM

## 2019-11-26 DIAGNOSIS — G894 Chronic pain syndrome: Secondary | ICD-10-CM | POA: Diagnosis present

## 2019-11-26 DIAGNOSIS — Z8249 Family history of ischemic heart disease and other diseases of the circulatory system: Secondary | ICD-10-CM

## 2019-11-26 DIAGNOSIS — D57211 Sickle-cell/Hb-C disease with acute chest syndrome: Principal | ICD-10-CM | POA: Diagnosis present

## 2019-11-26 DIAGNOSIS — I10 Essential (primary) hypertension: Secondary | ICD-10-CM | POA: Diagnosis present

## 2019-11-26 LAB — TROPONIN I (HIGH SENSITIVITY)
Troponin I (High Sensitivity): 6 ng/L (ref <18)
Troponin I (High Sensitivity): 6 ng/L (ref <18)

## 2019-11-26 LAB — CBC
HCT: 35.8 % — ABNORMAL LOW (ref 39.0–52.0)
Hemoglobin: 13.4 g/dL (ref 13.0–17.0)
MCH: 28.6 pg (ref 26.0–34.0)
MCHC: 37.4 g/dL — ABNORMAL HIGH (ref 30.0–36.0)
MCV: 76.3 fL — ABNORMAL LOW (ref 80.0–100.0)
Platelets: 231 10*3/uL (ref 150–400)
RBC: 4.69 MIL/uL (ref 4.22–5.81)
RDW: 18.2 % — ABNORMAL HIGH (ref 11.5–15.5)
WBC: 11.1 10*3/uL — ABNORMAL HIGH (ref 4.0–10.5)
nRBC: 5.8 % — ABNORMAL HIGH (ref 0.0–0.2)

## 2019-11-26 LAB — BASIC METABOLIC PANEL
Anion gap: 10 (ref 5–15)
BUN: 8 mg/dL (ref 6–20)
CO2: 20 mmol/L — ABNORMAL LOW (ref 22–32)
Calcium: 10.1 mg/dL (ref 8.9–10.3)
Chloride: 101 mmol/L (ref 98–111)
Creatinine, Ser: 1.2 mg/dL (ref 0.61–1.24)
GFR, Estimated: >60 mL/min (ref >60)
Glucose, Bld: 107 mg/dL — ABNORMAL HIGH (ref 70–99)
Potassium: 3.9 mmol/L (ref 3.5–5.1)
Sodium: 131 mmol/L — ABNORMAL LOW (ref 135–145)

## 2019-11-26 LAB — RETICULOCYTES
Immature Retic Fract: 16.8 % — ABNORMAL HIGH (ref 2.3–15.9)
RBC.: 4.74 MIL/uL (ref 4.22–5.81)
Retic Count, Absolute: 189.1 10*3/uL — ABNORMAL HIGH (ref 19.0–186.0)
Retic Ct Pct: 4 % — ABNORMAL HIGH (ref 0.4–3.1)

## 2019-11-26 LAB — RESPIRATORY PANEL BY RT PCR (FLU A&B, COVID)
Influenza A by PCR: NEGATIVE
Influenza B by PCR: NEGATIVE
SARS Coronavirus 2 by RT PCR: NEGATIVE

## 2019-11-26 LAB — LIPASE, BLOOD: Lipase: 25 U/L (ref 11–51)

## 2019-11-26 MED ORDER — ONDANSETRON HCL 4 MG/2ML IJ SOLN
4.0000 mg | INTRAMUSCULAR | Status: DC | PRN
  Administered 2019-11-26: 4 mg via INTRAVENOUS
  Filled 2019-11-26: qty 2

## 2019-11-26 MED ORDER — ACETAMINOPHEN 325 MG PO TABS
650.0000 mg | ORAL_TABLET | Freq: Once | ORAL | Status: AC
  Administered 2019-11-26: 650 mg via ORAL

## 2019-11-26 MED ORDER — HYDROMORPHONE HCL 2 MG/ML IJ SOLN
2.0000 mg | INTRAMUSCULAR | Status: AC
  Administered 2019-11-26: 2 mg via INTRAVENOUS
  Filled 2019-11-26: qty 1

## 2019-11-26 MED ORDER — SODIUM CHLORIDE 0.9 % IV SOLN
500.0000 mg | Freq: Once | INTRAVENOUS | Status: AC
  Administered 2019-11-26: 500 mg via INTRAVENOUS
  Filled 2019-11-26: qty 500

## 2019-11-26 MED ORDER — HYDROMORPHONE HCL 2 MG/ML IJ SOLN
2.0000 mg | INTRAMUSCULAR | Status: AC
  Administered 2019-11-27: 2 mg via INTRAVENOUS
  Filled 2019-11-26: qty 1

## 2019-11-26 MED ORDER — DIPHENHYDRAMINE HCL 50 MG/ML IJ SOLN
25.0000 mg | Freq: Once | INTRAMUSCULAR | Status: AC
  Administered 2019-11-26: 25 mg via INTRAVENOUS
  Filled 2019-11-26: qty 1

## 2019-11-26 MED ORDER — SODIUM CHLORIDE 0.9 % IV SOLN
1.0000 g | Freq: Once | INTRAVENOUS | Status: AC
  Administered 2019-11-26: 1 g via INTRAVENOUS
  Filled 2019-11-26: qty 10

## 2019-11-26 MED ORDER — ACETAMINOPHEN 325 MG PO TABS
ORAL_TABLET | ORAL | Status: AC
  Filled 2019-11-26: qty 2

## 2019-11-26 MED ORDER — ACETAMINOPHEN 325 MG PO TABS
650.0000 mg | ORAL_TABLET | Freq: Once | ORAL | Status: AC | PRN
  Administered 2019-11-26: 650 mg via ORAL
  Filled 2019-11-26: qty 2

## 2019-11-26 NOTE — ED Provider Notes (Addendum)
[facility]-EMERGENCY DEPT Provider Note   CSN: [id number] Arrival date & time: 11/26/19  2017     History Chief Complaint  Patient presents with  . Shortness of Breath    Dean Webb is a 44 y.o. male with a history of sickle cell anemia, hypertension, OSA on sleep apnea, asthma, & chronic opiate use who presents to the ED with complaints of chest pain that started yesterday. Patient states pain is located in the right lower chest, it is constant, worse with deep breathing & certain movements, no alleviating factors. Associated sxs include dyspnea, fever, chills, & dry cough. Does not feel similar to patient's prior sickle cell pain problems. Denies leg pain/swelling, hemoptysis, recent surgery/trauma, recent long travel, hormone use, personal hx of cancer, or hx of DVT/PE. He has not had his covid 19 vaccines.    HPI     Past Medical History:  Diagnosis Date  . Asthma   . GERD (gastroesophageal reflux disease)   . Hb-S/Hb-C disease (HCC) 1979  . HTN (hypertension) 2008  . Hypertension   . Sickle cell anemia (HCC)   . Sickle cell disease [facility])     Patient Active Problem List   Diagnosis Date Noted  . 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 Cocaine abuse (HCC) 08/08/2017  . H. pylori duodenitis 08/08/2017  . Sickle cell anemia with crisis (HCC) 07/11/2017  . Pneumobilia   . Sickle cell pain crisis (HCC) 04/29/2017  . CAP (community acquired pneumonia) 04/24/2017  . OSA (obstructive sleep apnea) 04/22/2017  . Mild asthma without complication 04/22/2017  . Essential hypertension 03/11/2016  . Chronic prescription opiate use 03/11/2016  . Precordial chest pain 03/08/2016  . Sickle cell disease (HCC)     Past Surgical History:  Procedure Laterality Date  . CHOLECYSTECTOMY         Family History  Problem Relation Age of Onset  . Sickle cell trait Mother   . Hypertension Mother   .  Asthma Mother   . Sickle cell trait Father   . Congestive Heart Failure Father   . Hypertension Sister   . Sickle cell anemia Daughter   . Hypertension Daughter   . Diabetes Daughter     Social History   Tobacco Use  . Smoking status: Current Every Day Smoker    Packs/day: 0.50    Years: 17.00    Pack years: 8.50  . Smokeless tobacco: Never Used  Vaping Use  . Vaping Use: Never used  Substance Use Topics  . Alcohol use: Yes    Comment: Occasional drinker  . Drug use: Not Currently    Types: Marijuana    Home Medications Prior to Admission medications   Medication Sig Start Date End Date Taking? Authorizing Provider  amLODipine (NORVASC) 10 MG tablet Take 1 tablet (10 mg total) by mouth daily. 11/19/19 11/18/20 Yes Barbette Merino, NP  folic acid (FOLVITE) 1 MG tablet Take 1 tablet (1 mg total) by mouth daily. 09/19/19  Yes Barbette Merino, NP  omeprazole (PRILOSEC) 20 MG capsule Take 1 capsule (20 mg total) by mouth 2 (two) times daily before a meal. Patient taking differently: Take 20 mg by mouth 2 (two) times daily as needed (indigestion).  09/19/19  Yes Barbette Merino, NP  Oxycodone HCl 10 MG TABS Take 1 tablet (10 mg total) by mouth every 6 (six) hours as needed for up to 15 days. Patient taking differently: Take  10 mg by mouth every 6 (six) hours as needed (pain).  11/19/19 12/04/19 Yes Barbette Merino, NP  albuterol (VENTOLIN HFA) 108 (90 Base) MCG/ACT inhaler Inhale 2 puffs into the lungs every 6 (six) hours as needed for wheezing or shortness of breath. Patient not taking: Reported on 11/19/2019 09/19/19 09/18/20  Barbette Merino, NP  cetirizine (ZYRTEC) 10 MG tablet Take 1 tablet (10 mg total) by mouth daily as needed for allergies. Patient not taking: Reported on 09/19/2019 09/17/17   Mike Gip, FNP  ibuprofen (ADVIL,MOTRIN) 800 MG tablet Take 1 tablet (800 mg total) by mouth every 8 (eight) hours as needed. Patient not taking: Reported on 09/19/2019 10/25/17   Mike Gip,  FNP  nicotine (NICODERM CQ - DOSED IN MG/24 HOURS) 21 mg/24hr patch Place 1 patch (21 mg total) onto the skin daily. Patient not taking: Reported on 09/19/2019 09/17/17   Mike Gip, FNP    Allergies    Patient has no known allergies.  Review of Systems   Review of Systems  Constitutional: Positive for chills and fever.  HENT: Negative for congestion, ear pain and sore throat.   Respiratory: Positive for cough and shortness of breath.   Cardiovascular: Positive for chest pain. Negative for leg swelling.  Gastrointestinal: Negative for abdominal pain, anal bleeding, constipation, diarrhea, nausea and vomiting.  Genitourinary: Negative for dysuria.  Neurological: Negative for syncope.  All other systems reviewed and are negative.   Physical Exam Updated Vital Signs BP 130/70   Pulse 85   Temp 99.6 F (37.6 C)   Resp 20   Ht 5\' 7"  (1.702 m)   Wt 90.7 kg   SpO2 95%   BMI 31.32 kg/m   Physical Exam Vitals and nursing note reviewed.  Constitutional:      General: He is not in acute distress.    Appearance: He is well-developed. He is not toxic-appearing.  HENT:     Head: Normocephalic and atraumatic.  Eyes:     General:        Right eye: No discharge.        Left eye: No discharge.     Conjunctiva/sclera: Conjunctivae normal.  Cardiovascular:     Rate and Rhythm: Normal rate and regular rhythm.  Pulmonary:     Effort: Pulmonary effort is normal. No respiratory distress.     Breath sounds: Examination of the right-lower field reveals rales. Rales present. No wheezing or rhonchi.  Chest:     Chest wall: Tenderness (right lower anterior chest wall) present.  Abdominal:     General: There is no distension.     Palpations: Abdomen is soft.     Tenderness: There is no abdominal tenderness.  Musculoskeletal:     Cervical back: Neck supple.     Right lower leg: No tenderness. No edema.     Left lower leg: No tenderness. No edema.  Skin:    General: Skin is warm and dry.      Findings: No rash.  Neurological:     Mental Status: He is alert.     Comments: Clear speech.   Psychiatric:        Behavior: Behavior normal.     ED Results / Procedures / Treatments   Labs (all labs ordered are listed, but only abnormal results are displayed) Labs Reviewed  BASIC METABOLIC PANEL - Abnormal; Notable for the following components:      Result Value   Sodium 131 (*)    CO2 20 (*)  Glucose, Bld 107 (*)    All other components within normal limits  CBC - Abnormal; Notable for the following components:   WBC 11.1 (*)    HCT 35.8 (*)    MCV 76.3 (*)    MCHC 37.4 (*)    RDW 18.2 (*)    nRBC 5.8 (*)    All other components within normal limits  RETICULOCYTES - Abnormal; Notable for the following components:   Retic Ct Pct 4.0 (*)    Retic Count, Absolute 189.1 (*)    Immature Retic Fract 16.8 (*)    All other components within normal limits  RESPIRATORY PANEL BY RT PCR (FLU A&B, COVID)  LIPASE, BLOOD  TROPONIN I (HIGH SENSITIVITY)  TROPONIN I (HIGH SENSITIVITY)    EKG None EKG Interpretation  Date/Time: 11/26/19 @ 20:40   Ventricular Rate:  97 bpm     QRS Duration: 72 ms   QT Interval: 337 ms   QTC Calculation: 416 ms   R Axis:  Normal    Text Interpretation: No STEMI    Vent. rate 97 BPM PR interval * ms QRS duration 72 ms QT/QTc 327/416 ms P-R-T axes 42 27 30  Radiology DG Chest 2 View  Result Date: 11/26/2019 CLINICAL DATA:  Dyspnea, sickle cell disease, right chest pain EXAM: CHEST - 2 VIEW COMPARISON:  08/08/2017 FINDINGS: The lungs are symmetrically well expanded. There is focal consolidation within the right lateral lung base, acute chest syndrome versus is infection. Minimal residual scarring at the left costophrenic angle. The lungs are otherwise clear. No pneumothorax or pleural effusion. Cardiac size is within normal limits. No acute bone abnormality. IMPRESSION: Focal consolidation at the right costophrenic angle, acute chest  syndrome versus acute infection. Electronically Signed   By: Helyn NumbersAshesh  Parikh MD   On: 11/26/2019 20:59    Procedures .Critical Care Performed by: Cherly AndersonPetrucelli, Makenah Karas R, PA-C Authorized by: Cherly AndersonPetrucelli, Simon Aaberg R, PA-C    CRITICAL CARE Performed by: Harvie HeckSamantha Norvella Loscalzo   Total critical care time: 30 minutes  Critical care time was exclusive of separately billable procedures and treating other patients.  Critical care was necessary to treat or prevent imminent or life-threatening deterioration.  Critical care was time spent personally by me on the following activities: development of treatment plan with patient and/or surrogate as well as nursing, discussions with consultants, evaluation of patient's response to treatment, examination of patient, obtaining history from patient or surrogate, ordering and performing treatments and interventions, ordering and review of laboratory studies, ordering and review of radiographic studies, pulse oximetry and re-evaluation of patient's condition.  (including critical care time)  Medications Ordered in ED Medications  HYDROmorphone (DILAUDID) injection 2 mg (has no administration in time range)  HYDROmorphone (DILAUDID) injection 2 mg (has no administration in time range)  diphenhydrAMINE (BENADRYL) injection 25 mg (has no administration in time range)  ondansetron (ZOFRAN) injection 4 mg (has no administration in time range)  cefTRIAXone (ROCEPHIN) 1 g in sodium chloride 0.9 % 100 mL IVPB (has no administration in time range)  azithromycin (ZITHROMAX) 500 mg in sodium chloride 0.9 % 250 mL IVPB (has no administration in time range)  acetaminophen (TYLENOL) tablet 650 mg (650 mg Oral Given 11/26/19 2059)    ED Course  I have reviewed the triage vital signs and the nursing notes.  Pertinent labs & imaging results that were available during my care of the patient were reviewed by me and considered in my medical decision making (see chart for  details).  MDM Rules/Calculators/A&P                         Patient presents to the ED with complaints of chest pain.  On arrival patient is febrile & tachycardic on arrival, resolved with antipyretics prior to my assessment.  On exam rales @ the right base with right lower chest wall tenderness to palpation. Not in respiratory distress.  DDX: Acute chest syndrome, pneumonia, PE, ACS, pneumothorax, sickle cell pain crisis.   Additional history obtained:  Additional history obtained from chart review & nursing note review. Previous records obtained and reviewed- admission for acute chest syndrome 07/2017.   Lab Tests:  I reviewed and interpreted labs, which included:  COVID/influenza testing: Negative CBC: Mild leukocytosis. Not significantly anemic.  BMP: mild hyponatremia & low bicarb. Renal function preserved.  Retic countd mildly elevated.  Troponin: WNL  Imaging Studies ordered:  CXR ordered per triage, I independently visualized and interpreted imaging which showed Focal consolidation at the right costophrenic angle, acute chest syndrome versus acute infection  Considered PE, felt to be less likely without hypoxia or tachycardia S/p antipyretics, troponin is also WNL which is reassuring. Will start abx for coverage of pneumonia/acute chest syndrome. Will discuss w/ hospitalist service for admission.   23:24: CONSULT: Discussed with hospitalist Dr. Allena Katz- accepts admission.   Findings and plan of care discussed with supervising physician Dr. Estell Harpin who is in agreement.   Portions of this note were generated with Scientist, clinical (histocompatibility and immunogenetics). Dictation errors may occur despite best attempts at proofreading.  Final Clinical Impression(s) / ED Diagnoses Final diagnoses:  Acute chest syndrome [facility])    Rx / DC Orders ED Discharge Orders    None       Cherly Anderson, PA-C 11/26/19 2357  Deitrick Ferreri Avans was evaluated in Emergency Department on 11/26/2019 for the  symptoms described in the history of present illness. He/she was evaluated in the context of the global COVID-19 pandemic, which necessitated consideration that the patient might be at risk for infection with the SARS-CoV-2 virus that causes COVID-19. Institutional protocols and algorithms that pertain to the evaluation of patients at risk for COVID-19 are in a state of rapid change based on information released by regulatory bodies including the CDC and federal and state organizations. These policies and algorithms were followed during the patient's care in the ED.     Desmond Lope 11/26/19 2358    Bethann Berkshire, MD 11/28/19 1104

## 2019-11-26 NOTE — ED Triage Notes (Signed)
Pt presents with right sided abdominal pain, loss of appetite  xs 2 days. Denies any fall or injury

## 2019-11-26 NOTE — ED Notes (Signed)
Lactic acid collected 

## 2019-11-26 NOTE — H&P (Signed)
History and Physical    Dean Webb DJT:[id number] DOB: 1975-09-24 DOA: 11/26/2019  PCP: Barbette Merino, NP  Patient coming from: Home  I have personally briefly reviewed patient's old medical records in [facility]  Chief Complaint: Fevers, dyspnea  HPI: Dean Webb is a 44 y.o. male with medical history significant for sickle cell disease, hypertension, asthma, OSA who presents to the ED for evaluation of fevers and dyspnea.  Patient reports new onset of right lower chest wall pain and dyspnea beginning evening of 10/12.  He has had associated fevers, chills, and occasional dry cough.  He denies any left-sided chest pain, nausea, vomiting, abdominal pain, dysuria, diarrhea, or swelling in his legs.  He says this feels similar to his previous sickle cell pain episodes.  ED Course:  Initial vitals showed BP 150/113, pulse 112, RR 19, temp 101.2 Fahrenheit with T-max 101.8 Fahrenheit, SPO2 95% on room air.  Labs show WBC 11.1, hemoglobin 13.4, platelets 231,000 reticulocyte count 4.0%, sodium 131, potassium 3.9, bicarb 20, BUN 8, creatinine 1.2, serum glucose 107, high-sensitivity troponin I 6x2.  SARS-CoV-2 PCR is negative.  Influenza a and B are negative.  2 view chest x-ray shows focal consolidation at the right costophrenic angle concerning for acute chest syndrome versus acute infection.  Patient was given IV Dilaudid 2 mg x 1, IV Benadryl, IV ceftriaxone and azithromycin, and oral Tylenol.  The hospitalist service was consulted to admit for further evaluation management.  Review of Systems: All systems reviewed and are negative except as documented in history of present illness above.   Past Medical History:  Diagnosis Date   Asthma    GERD (gastroesophageal reflux disease)    Hb-S/Hb-C disease (HCC) 1979   HTN (hypertension) 2008   Hypertension    Sickle cell anemia (HCC)    Sickle cell disease (HCC)     Past Surgical History:  Procedure  Laterality Date   CHOLECYSTECTOMY      Social History:  reports that he has been smoking. He has a 8.50 pack-year smoking history. He has never used smokeless tobacco. He reports current alcohol use. He reports previous drug use. Drug: Marijuana.  No Known Allergies  Family History  Problem Relation Age of Onset   Sickle cell trait Mother    Hypertension Mother    Asthma Mother    Sickle cell trait Father    Congestive Heart Failure Father    Hypertension Sister    Sickle cell anemia Daughter    Hypertension Daughter    Diabetes Daughter      Prior to Admission medications   Medication Sig Start Date End Date Taking? Authorizing Provider  amLODipine (NORVASC) 10 MG tablet Take 1 tablet (10 mg total) by mouth daily. 11/19/19 11/18/20 Yes Barbette Merino, NP  folic acid (FOLVITE) 1 MG tablet Take 1 tablet (1 mg total) by mouth daily. 09/19/19  Yes Barbette Merino, NP  omeprazole (PRILOSEC) 20 MG capsule Take 1 capsule (20 mg total) by mouth 2 (two) times daily before a meal. Patient taking differently: Take 20 mg by mouth 2 (two) times daily as needed (indigestion).  09/19/19  Yes Barbette Merino, NP  Oxycodone HCl 10 MG TABS Take 1 tablet (10 mg total) by mouth every 6 (six) hours as needed for up to 15 days. Patient taking differently: Take 10 mg by mouth every 6 (six) hours as needed (pain).  11/19/19 12/04/19 Yes Barbette Merino, NP  albuterol (VENTOLIN HFA) 108 (  90 Base) MCG/ACT inhaler Inhale 2 puffs into the lungs every 6 (six) hours as needed for wheezing or shortness of breath. Patient not taking: Reported on 11/19/2019 09/19/19 09/18/20  Barbette Merino, NP  cetirizine (ZYRTEC) 10 MG tablet Take 1 tablet (10 mg total) by mouth daily as needed for allergies. Patient not taking: Reported on 09/19/2019 09/17/17   Mike Gip, FNP  ibuprofen (ADVIL,MOTRIN) 800 MG tablet Take 1 tablet (800 mg total) by mouth every 8 (eight) hours as needed. Patient not taking: Reported on 09/19/2019  10/25/17   Mike Gip, FNP  nicotine (NICODERM CQ - DOSED IN MG/24 HOURS) 21 mg/24hr patch Place 1 patch (21 mg total) onto the skin daily. Patient not taking: Reported on 09/19/2019 09/17/17   Mike Gip, FNP    Physical Exam: Vitals:   11/26/19 2026 11/26/19 2215 11/26/19 2300  BP: (!) 121/102 130/70 (!) 132/93  Pulse: (!) 102 85 91  Resp: 18 20 11   Temp: (!) 101.8 F (38.8 C) 99.6 F (37.6 C)   TempSrc: Oral    SpO2: 96% 95% 93%  Weight: 90.7 kg    Height: 5\' 7"  (1.702 m)     Constitutional: Somnolent but in NAD, calm, comfortable Eyes: PERRL, lids and conjunctivae normal ENMT: Mucous membranes are moist. Posterior pharynx clear of any exudate or lesions.Normal dentition.  Neck: normal, supple, no masses. Respiratory: Inspiratory crackles at the bases. Normal respiratory effort. No accessory muscle use.  Cardiovascular: Regular rate and rhythm, no murmurs / rubs / gallops. No extremity edema. 2+ pedal pulses. Abdomen: no tenderness, no masses palpated. No hepatosplenomegaly. Bowel sounds positive.  Musculoskeletal: no clubbing / cyanosis. No joint deformity upper and lower extremities. Good ROM, no contractures. Normal muscle tone.  Skin: no rashes, lesions, ulcers. No induration Neurologic: CN 2-12 grossly intact. Sensation intact, Strength 5/5 in all 4.  Psychiatric: Normal judgment and insight. Alert and oriented x 3. Normal mood.    Labs on Admission: I have personally reviewed following labs and imaging studies  CBC: Recent Labs  Lab 11/26/19 2045  WBC 11.1*  HGB 13.4  HCT 35.8*  MCV 76.3*  PLT 231   Basic Metabolic Panel: Recent Labs  Lab 11/26/19 2045  NA 131*  K 3.9  CL 101  CO2 20*  GLUCOSE 107*  BUN 8  CREATININE 1.20  CALCIUM 10.1   GFR: Estimated Creatinine Clearance: 85.2 mL/min (by C-G formula based on SCr of 1.2 mg/dL). Liver Function Tests: No results for input(s): AST, ALT, ALKPHOS, BILITOT, PROT, ALBUMIN in the last 168  hours. Recent Labs  Lab 11/26/19 2043  LIPASE 25   No results for input(s): AMMONIA in the last 168 hours. Coagulation Profile: No results for input(s): INR, PROTIME in the last 168 hours. Cardiac Enzymes: No results for input(s): CKTOTAL, CKMB, CKMBINDEX, TROPONINI in the last 168 hours. BNP (last 3 results) No results for input(s): PROBNP in the last 8760 hours. HbA1C: No results for input(s): HGBA1C in the last 72 hours. CBG: No results for input(s): GLUCAP in the last 168 hours. Lipid Profile: No results for input(s): CHOL, HDL, LDLCALC, TRIG, CHOLHDL, LDLDIRECT in the last 72 hours. Thyroid Function Tests: No results for input(s): TSH, T4TOTAL, FREET4, T3FREE, THYROIDAB in the last 72 hours. Anemia Panel: Recent Labs    11/26/19 2101  RETICCTPCT 4.0*   Urine analysis:    Component Value Date/Time   COLORURINE YELLOW 04/30/2017 0122   APPEARANCEUR CLEAR 04/30/2017 0122   LABSPEC >1.046 (H) 04/30/2017 0122  PHURINE 5.0 04/30/2017 0122   GLUCOSEU NEGATIVE 04/30/2017 0122   HGBUR NEGATIVE 04/30/2017 0122   BILIRUBINUR negative 11/19/2019 1532   KETONESUR NEGATIVE 04/30/2017 0122   PROTEINUR Negative 11/19/2019 1532   PROTEINUR NEGATIVE 04/30/2017 0122   UROBILINOGEN 0.2 11/19/2019 1532   UROBILINOGEN 0.2 11/01/2012 1010   NITRITE negative 11/19/2019 1532   NITRITE NEGATIVE 04/30/2017 0122   LEUKOCYTESUR Negative 11/19/2019 1532    Radiological Exams on Admission: DG Chest 2 View  Result Date: 11/26/2019 CLINICAL DATA:  Dyspnea, sickle cell disease, right chest pain EXAM: CHEST - 2 VIEW COMPARISON:  08/08/2017 FINDINGS: The lungs are symmetrically well expanded. There is focal consolidation within the right lateral lung base, acute chest syndrome versus is infection. Minimal residual scarring at the left costophrenic angle. The lungs are otherwise clear. No pneumothorax or pleural effusion. Cardiac size is within normal limits. No acute bone abnormality.  IMPRESSION: Focal consolidation at the right costophrenic angle, acute chest syndrome versus acute infection. Electronically Signed   By: Helyn Numbers MD   On: 11/26/2019 20:59    EKG: Personally reviewed. Sinus rhythm without acute ischemic changes.  Rate is faster when compared to prior.  Assessment/Plan Principal Problem:   Acute chest syndrome due to sickle-cell disease (HCC) Active Problems:   Essential hypertension   OSA (obstructive sleep apnea)   Sickle cell pain crisis (HCC)  Dean Webb is a 44 y.o. male with medical history significant for sickle cell disease, hypertension, asthma, OSA who is admitted with acute chest injury.  Acute chest syndrome Acute sickle cell pain crisis: Patient with reported sickle cell pain crisis episode and right lower chest wall discomfort.  He is febrile with a focal consolidation seen at the right costophrenic angle on chest x-ray.  Currently saturating well on room air. -Place on weight-based Dilaudid PCA pump -Continue IV fluid hydration with D5-1/2 NS overnight -IV Toradol 15 mg every 6 hours -Continue empiric ceftriaxone and azithromycin  Hypertension: BP currently stable.  Holding amlodipine for now.  OSA: Not using CPAP, likely uncontrolled as an outpatient.  Use supplemental oxygen as needed.  DVT prophylaxis: Lovenox Code Status: Full code, confirmed with patient Family Communication: Discussed with patient, he has discussed with family Disposition Plan: From home and likely discharge to home pending adequate pain control Consults called: None Admission status:  Status is: Observation  The patient remains OBS appropriate and will d/c before 2 midnights.  Dispo: The patient is from: Home              Anticipated d/c is to: Home              Anticipated d/c date is: 1 day              Patient currently is not medically stable to d/c.  Darreld Mclean MD [facility]  If 7PM-7AM, please contact  night-coverage [email or web address]  11/26/2019, 11:38 PM

## 2019-11-26 NOTE — ED Triage Notes (Signed)
Patient arrived with complaints of right side pain and shortness of breath. Patient has sickle sell but states this does not feel the same.

## 2019-11-27 DIAGNOSIS — Z79899 Other long term (current) drug therapy: Secondary | ICD-10-CM | POA: Diagnosis not present

## 2019-11-27 DIAGNOSIS — Z9049 Acquired absence of other specified parts of digestive tract: Secondary | ICD-10-CM | POA: Diagnosis not present

## 2019-11-27 DIAGNOSIS — G4733 Obstructive sleep apnea (adult) (pediatric): Secondary | ICD-10-CM | POA: Diagnosis present

## 2019-11-27 DIAGNOSIS — Z833 Family history of diabetes mellitus: Secondary | ICD-10-CM | POA: Diagnosis not present

## 2019-11-27 DIAGNOSIS — D57211 Sickle-cell/Hb-C disease with acute chest syndrome: Secondary | ICD-10-CM | POA: Diagnosis present

## 2019-11-27 DIAGNOSIS — I10 Essential (primary) hypertension: Secondary | ICD-10-CM | POA: Diagnosis present

## 2019-11-27 DIAGNOSIS — F17200 Nicotine dependence, unspecified, uncomplicated: Secondary | ICD-10-CM | POA: Diagnosis present

## 2019-11-27 DIAGNOSIS — Z825 Family history of asthma and other chronic lower respiratory diseases: Secondary | ICD-10-CM | POA: Diagnosis not present

## 2019-11-27 DIAGNOSIS — D5700 Hb-SS disease with crisis, unspecified: Secondary | ICD-10-CM | POA: Diagnosis not present

## 2019-11-27 DIAGNOSIS — D5701 Hb-SS disease with acute chest syndrome: Secondary | ICD-10-CM | POA: Diagnosis present

## 2019-11-27 DIAGNOSIS — K219 Gastro-esophageal reflux disease without esophagitis: Secondary | ICD-10-CM | POA: Diagnosis present

## 2019-11-27 DIAGNOSIS — Z8249 Family history of ischemic heart disease and other diseases of the circulatory system: Secondary | ICD-10-CM | POA: Diagnosis not present

## 2019-11-27 DIAGNOSIS — G894 Chronic pain syndrome: Secondary | ICD-10-CM | POA: Diagnosis present

## 2019-11-27 DIAGNOSIS — Z20822 Contact with and (suspected) exposure to covid-19: Secondary | ICD-10-CM | POA: Diagnosis present

## 2019-11-27 DIAGNOSIS — J45909 Unspecified asthma, uncomplicated: Secondary | ICD-10-CM | POA: Diagnosis present

## 2019-11-27 DIAGNOSIS — Z79891 Long term (current) use of opiate analgesic: Secondary | ICD-10-CM | POA: Diagnosis not present

## 2019-11-27 DIAGNOSIS — D5720 Sickle-cell/Hb-C disease without crisis: Secondary | ICD-10-CM | POA: Diagnosis not present

## 2019-11-27 DIAGNOSIS — Z832 Family history of diseases of the blood and blood-forming organs and certain disorders involving the immune mechanism: Secondary | ICD-10-CM | POA: Diagnosis not present

## 2019-11-27 LAB — RAPID URINE DRUG SCREEN, HOSP PERFORMED
Amphetamines: NOT DETECTED
Barbiturates: NOT DETECTED
Benzodiazepines: NOT DETECTED
Cocaine: NOT DETECTED
Opiates: POSITIVE — AB
Tetrahydrocannabinol: NOT DETECTED

## 2019-11-27 LAB — URINALYSIS, ROUTINE W REFLEX MICROSCOPIC
Bilirubin Urine: NEGATIVE
Glucose, UA: NEGATIVE mg/dL
Hgb urine dipstick: NEGATIVE
Ketones, ur: NEGATIVE mg/dL
Leukocytes,Ua: NEGATIVE
Nitrite: NEGATIVE
Protein, ur: NEGATIVE mg/dL
Specific Gravity, Urine: 1.01 (ref 1.005–1.030)
pH: 5 (ref 5.0–8.0)

## 2019-11-27 LAB — HIV ANTIBODY (ROUTINE TESTING W REFLEX): HIV Screen 4th Generation wRfx: NONREACTIVE

## 2019-11-27 LAB — PROCALCITONIN: Procalcitonin: <0.1 ng/mL

## 2019-11-27 LAB — MRSA PCR SCREENING: MRSA by PCR: NEGATIVE

## 2019-11-27 MED ORDER — KETOROLAC TROMETHAMINE 15 MG/ML IJ SOLN
15.0000 mg | Freq: Four times a day (QID) | INTRAMUSCULAR | Status: AC
  Administered 2019-11-27 – 2019-12-01 (×20): 15 mg via INTRAVENOUS
  Filled 2019-11-27 (×20): qty 1

## 2019-11-27 MED ORDER — ENOXAPARIN SODIUM 40 MG/0.4ML ~~LOC~~ SOLN
40.0000 mg | SUBCUTANEOUS | Status: DC
  Administered 2019-11-27 – 2019-12-02 (×6): 40 mg via SUBCUTANEOUS
  Filled 2019-11-27 (×5): qty 0.4

## 2019-11-27 MED ORDER — CHLORHEXIDINE GLUCONATE CLOTH 2 % EX PADS
6.0000 | MEDICATED_PAD | Freq: Every day | CUTANEOUS | Status: DC
  Administered 2019-11-27 – 2019-12-02 (×2): 6 via TOPICAL

## 2019-11-27 MED ORDER — OXYCODONE HCL 5 MG PO TABS
10.0000 mg | ORAL_TABLET | ORAL | Status: DC | PRN
  Administered 2019-11-27 – 2019-12-01 (×7): 10 mg via ORAL
  Filled 2019-11-27 (×7): qty 2

## 2019-11-27 MED ORDER — FOLIC ACID 1 MG PO TABS
1.0000 mg | ORAL_TABLET | Freq: Every day | ORAL | Status: DC
  Administered 2019-11-27 – 2019-12-02 (×6): 1 mg via ORAL
  Filled 2019-11-27 (×6): qty 1

## 2019-11-27 MED ORDER — NICOTINE 21 MG/24HR TD PT24
21.0000 mg | MEDICATED_PATCH | Freq: Every day | TRANSDERMAL | Status: DC
  Administered 2019-11-27 – 2019-12-02 (×6): 21 mg via TRANSDERMAL
  Filled 2019-11-27 (×6): qty 1

## 2019-11-27 MED ORDER — SENNOSIDES-DOCUSATE SODIUM 8.6-50 MG PO TABS
1.0000 | ORAL_TABLET | Freq: Two times a day (BID) | ORAL | Status: DC
  Administered 2019-11-27 – 2019-12-02 (×11): 1 via ORAL
  Filled 2019-11-27 (×11): qty 1

## 2019-11-27 MED ORDER — SODIUM CHLORIDE 0.9 % IV SOLN
500.0000 mg | INTRAVENOUS | Status: DC
  Administered 2019-11-27 – 2019-11-29 (×3): 500 mg via INTRAVENOUS
  Filled 2019-11-27 (×3): qty 500

## 2019-11-27 MED ORDER — HYDROMORPHONE 1 MG/ML IV SOLN: INTRAVENOUS | Status: DC

## 2019-11-27 MED ORDER — PROMETHAZINE HCL 12.5 MG RE SUPP
12.5000 mg | RECTAL | Status: DC | PRN
  Filled 2019-11-27: qty 2

## 2019-11-27 MED ORDER — HYDROMORPHONE HCL 1 MG/ML IJ SOLN
1.0000 mg | INTRAMUSCULAR | Status: DC | PRN
  Administered 2019-11-27 – 2019-11-29 (×9): 1 mg via INTRAVENOUS
  Filled 2019-11-27 (×9): qty 1

## 2019-11-27 MED ORDER — FAMOTIDINE IN NACL 20-0.9 MG/50ML-% IV SOLN
20.0000 mg | Freq: Two times a day (BID) | INTRAVENOUS | Status: DC
  Administered 2019-11-27: 20 mg via INTRAVENOUS
  Filled 2019-11-27 (×3): qty 50

## 2019-11-27 MED ORDER — LIP MEDEX EX OINT
TOPICAL_OINTMENT | CUTANEOUS | Status: AC
  Filled 2019-11-27: qty 7

## 2019-11-27 MED ORDER — DEXTROSE-NACL 5-0.45 % IV SOLN: INTRAVENOUS | Status: DC

## 2019-11-27 MED ORDER — FAMOTIDINE 20 MG PO TABS
20.0000 mg | ORAL_TABLET | Freq: Two times a day (BID) | ORAL | Status: DC
  Administered 2019-11-27 – 2019-12-02 (×11): 20 mg via ORAL
  Filled 2019-11-27 (×11): qty 1

## 2019-11-27 MED ORDER — PROMETHAZINE HCL 25 MG PO TABS
12.5000 mg | ORAL_TABLET | ORAL | Status: DC | PRN
  Administered 2019-11-27: 25 mg via ORAL
  Administered 2019-11-27 – 2019-11-28 (×2): 12.5 mg via ORAL
  Filled 2019-11-27 (×3): qty 1

## 2019-11-27 MED ORDER — SODIUM CHLORIDE 0.9 % IV SOLN
25.0000 mg | INTRAVENOUS | Status: DC | PRN
  Filled 2019-11-27: qty 0.5

## 2019-11-27 MED ORDER — DIPHENHYDRAMINE HCL 25 MG PO CAPS: 25.0000 mg | ORAL_CAPSULE | ORAL | Status: DC | PRN

## 2019-11-27 MED ORDER — SODIUM CHLORIDE 0.45 % IV SOLN: INTRAVENOUS | Status: DC

## 2019-11-27 MED ORDER — NALOXONE HCL 0.4 MG/ML IJ SOLN: 0.4000 mg | INTRAMUSCULAR | Status: DC | PRN

## 2019-11-27 MED ORDER — SODIUM CHLORIDE 0.9 % IV SOLN
1.0000 g | INTRAVENOUS | Status: DC
  Administered 2019-11-27 – 2019-11-30 (×4): 1 g via INTRAVENOUS
  Filled 2019-11-27: qty 0.97
  Filled 2019-11-27: qty 10
  Filled 2019-11-27: qty 1
  Filled 2019-11-27 (×2): qty 0.97

## 2019-11-27 MED ORDER — ALBUTEROL SULFATE HFA 108 (90 BASE) MCG/ACT IN AERS
2.0000 | INHALATION_SPRAY | Freq: Four times a day (QID) | RESPIRATORY_TRACT | Status: DC | PRN
  Filled 2019-11-27: qty 6.7

## 2019-11-27 MED ORDER — POLYETHYLENE GLYCOL 3350 17 G PO PACK: 17.0000 g | PACK | Freq: Every day | ORAL | Status: DC | PRN

## 2019-11-27 MED ORDER — SODIUM CHLORIDE 0.9% FLUSH: 9.0000 mL | INTRAVENOUS | Status: DC | PRN

## 2019-11-27 NOTE — Progress Notes (Signed)
Subjective: Dean Webb is an 44 year old male with a medical history significant for sickle cell disease type [place], hypertension, tobacco dependence, history of anemia of chronic disease, and history of chronic pain syndrome was admitted for acute chest syndrome in the setting of sickle cell pain crisis.  Patient states that he was in his usual state of health until 2 days prior.  He states that he slept overnight on an air mattress and pain to right chest started suddenly.  He endorses some shortness of breath at that time which is since improved.  Pain is primarily to right rib cage, low back, and lower extremities.  Pain intensity is 8/10 characterized as intermittent and sharp.  Pain is worsened with deep breathing.   Objective:  Vital signs in last 24 hours:  Vitals:   11/27/19 0800 11/27/19 0802 11/27/19 0833 11/27/19 1200  BP: (!) 147/89 (!) 147/89 (!) 145/95   Pulse:  84 72   Resp: 20 14    Temp:    99 F (37.2 C)  TempSrc:    Oral  SpO2:  98% 98%   Weight:      Height:        Intake/Output from previous day:   Intake/Output Summary (Last 24 hours) at 11/27/2019 1236 Last data filed at 11/27/2019 0915 Gross per 24 hour  Intake 1220.03 ml  Output --  Net 1220.03 ml    Physical Exam: General: Alert, awake, oriented x3, in no acute distress.  HEENT: [place]/AT PEERL, EOMI Neck: Trachea midline,  no masses, no thyromegal,y no JVD, no carotid bruit OROPHARYNX:  Moist, No exudate/ erythema/lesions.  Heart: Regular rate and rhythm, without murmurs, rubs, gallops, PMI non-displaced, no heaves or thrills on palpation.  Lungs: Clear to auscultation, no wheezing or rhonchi noted. No increased vocal fremitus resonant to percussion  Abdomen: Soft, nontender, nondistended, positive bowel sounds, no masses no hepatosplenomegaly noted..  Neuro: No focal neurological deficits noted cranial nerves II through XII grossly intact. DTRs 2+ bilaterally upper and lower extremities. Strength 5  out of 5 in bilateral upper and lower extremities. Musculoskeletal: No warm swelling or erythema around joints, no spinal tenderness noted. Psychiatric: Patient alert and oriented x3, good insight and cognition, good recent to remote recall. Lymph node survey: No cervical axillary or inguinal lymphadenopathy noted.  Lab Results:  Basic Metabolic Panel:    Component Value Date/Time   NA 131 (L) 11/26/2019 2045   NA 136 09/19/2019 1509   K 3.9 11/26/2019 2045   CL 101 11/26/2019 2045   CO2 20 (L) 11/26/2019 2045   BUN 8 11/26/2019 2045   BUN 7 09/19/2019 1509   CREATININE 1.20 11/26/2019 2045   GLUCOSE 107 (H) 11/26/2019 2045   CALCIUM 10.1 11/26/2019 2045   CBC:    Component Value Date/Time   WBC 11.1 (H) 11/26/2019 2045   HGB 13.4 11/26/2019 2045   HGB 14.0 09/19/2019 1509   HCT 35.8 (L) 11/26/2019 2045   HCT 40.9 09/19/2019 1509   PLT 231 11/26/2019 2045   PLT 330 09/19/2019 1509   MCV 76.3 (L) 11/26/2019 2045   MCV 86 09/19/2019 1509   NEUTROABS 4.6 09/19/2019 1509   LYMPHSABS 3.5 (H) 09/19/2019 1509   MONOABS 1.4 (H) 08/11/2017 0328   EOSABS 0.4 09/19/2019 1509   BASOSABS 0.1 09/19/2019 1509    Recent Results (from the past 240 hour(s))  Respiratory Panel by RT PCR (Flu A&B, Covid) - Nasopharyngeal Swab     Status: None   Collection  Time: 11/26/19  8:58 PM   Specimen: Nasopharyngeal Swab  Result Value Ref Range Status   SARS Coronavirus 2 by RT PCR NEGATIVE NEGATIVE Final    Comment: (NOTE) SARS-CoV-2 target nucleic acids are NOT DETECTED.  The SARS-CoV-2 RNA is generally detectable in upper respiratoy specimens during the acute phase of infection. The lowest concentration of SARS-CoV-2 viral copies this assay can detect is 131 copies/mL. A negative result does not preclude SARS-Cov-2 infection and should not be used as the sole basis for treatment or other patient management decisions. A negative result may occur with  improper specimen collection/handling,  submission of specimen other than nasopharyngeal swab, presence of viral mutation(s) within the areas targeted by this assay, and inadequate number of viral copies (<131 copies/mL). A negative result must be combined with clinical observations, patient history, and epidemiological information. The expected result is Negative.  Fact Sheet for Patients:  https://www.moore.com/  Fact Sheet for Healthcare Providers:  https://www.young.biz/  This test is no t yet approved or cleared by the [place] FDA and  has been authorized for detection and/or diagnosis of SARS-CoV-2 by FDA under an Emergency Use Authorization (EUA). This EUA will remain  in effect (meaning this test can be used) for the duration of the COVID-19 declaration under Section 564(b)(1) of the Act, 21 U.S.C. section 360bbb-3(b)(1), unless the authorization is terminated or revoked sooner.     Influenza A by PCR NEGATIVE NEGATIVE Final   Influenza B by PCR NEGATIVE NEGATIVE Final    Comment: (NOTE) The Xpert Xpress SARS-CoV-2/FLU/RSV assay is intended as an aid in  the diagnosis of influenza from Nasopharyngeal swab specimens and  should not be used as a sole basis for treatment. Nasal washings and  aspirates are unacceptable for Xpert Xpress SARS-CoV-2/FLU/RSV  testing.  Fact Sheet for Patients: https://www.moore.com/  Fact Sheet for Healthcare Providers: https://www.young.biz/  This test is not yet approved or cleared by the [place] FDA and  has been authorized for detection and/or diagnosis of SARS-CoV-2 by  FDA under an Emergency Use Authorization (EUA). This EUA will remain  in effect (meaning this test can be used) for the duration of the  Covid-19 declaration under Section 564(b)(1) of the Act, 21  U.S.C. section 360bbb-3(b)(1), unless the authorization is  terminated or revoked. Performed at [facility], [address] [place], [place] [address]   MRSA PCR Screening     Status: None   Collection Time: 11/27/19  9:10 AM   Specimen: Nasal Mucosa; Nasopharyngeal  Result Value Ref Range Status   MRSA by PCR NEGATIVE NEGATIVE Final    Comment:        The GeneXpert MRSA Assay (FDA approved for NASAL specimens only), is one component of a comprehensive MRSA colonization surveillance program. It is not intended to diagnose MRSA infection nor to guide or monitor treatment for MRSA infections. Performed at [facility], [address], [place], [place] [address]     Studies/Results: DG Chest 2 View  Result Date: 11/26/2019 CLINICAL DATA:  Dyspnea, sickle cell disease, right chest pain EXAM: CHEST - 2 VIEW COMPARISON:  08/08/2017 FINDINGS: The lungs are symmetrically well expanded. There is focal consolidation within the right lateral lung base, acute chest syndrome versus is infection. Minimal residual scarring at the left costophrenic angle. The lungs are otherwise clear. No pneumothorax or pleural effusion. Cardiac size is within normal limits. No acute bone abnormality. IMPRESSION: Focal consolidation at the right costophrenic angle, acute chest  syndrome versus acute infection. Electronically Signed   By: Helyn Numbers MD   On: 11/26/2019 20:59    Medications: Scheduled Meds: . Chlorhexidine Gluconate Cloth  6 each Topical Daily  . enoxaparin (LOVENOX) injection  40 mg Subcutaneous Q24H  . famotidine  20 mg Oral Q12H  . folic acid  1 mg Oral Daily  . ketorolac  15 mg Intravenous Q6H  . senna-docusate  1 tablet Oral BID   Continuous Infusions: . sodium chloride    . azithromycin    . cefTRIAXone (ROCEPHIN)  IV    . diphenhydrAMINE    . famotidine (PEPCID) IV Stopped (11/27/19 0148)   PRN Meds:.albuterol, diphenhydrAMINE **OR** diphenhydrAMINE, HYDROmorphone (DILAUDID) injection, ondansetron, oxyCODONE, polyethylene glycol, promethazine **OR**  promethazine  Consultants:  None  Procedures:  None  Antibiotics:  IV azithromycin  IV Rocephin  Assessment/Plan: Principal Problem:   Acute chest syndrome due to sickle-cell disease (HCC) Active Problems:   Essential hypertension   OSA (obstructive sleep apnea)   Sickle cell pain crisis (HCC)  CAP versus acute chest syndrome: Chest x-ray shows focal consolidation at the right costophrenic angle, acute chest syndrome versus acute infection.  Continue empiric antibiotics. Blood cultures negative.  Procalcitonin negative.  COVID-19 test negative 2 L supplemental oxygen Incentive spirometer Tylenol 650 mg every 6 hours as needed for fever Patient hemodynamically stable, transferred to telemetry Sickle cell disease with pain crisis: Patient unable to utilize PCA due to increased drowsiness, will discontinue.  Initiate IV Dilaudid 1 mg every 2 hours as needed 0.45% saline at 100 mL/h Toradol 15 mg IV every 6 hours for total of 5 days Oxycodone 10 mg every 4 hours as needed for severe breakthrough pain Monitor vital signs closely, reevaluate pain scale regularly, and supplemental oxygen as needed.  Essential hypertension: Stable.  Continue home medications.  Sickle cell anemia: Hemoglobin 13.4, consistent with patient's baseline.  There is no clinical indication for blood transfusion at this time.  Continue to follow closely. Folic acid 1 mg daily  Tobacco dependence: Smoking cessation instruction/counseling given:  counseled patient on the dangers of tobacco use, advised patient to stop smoking, and reviewed strategies to maximize success  History of obstructive sleep apnea: Patient states that he has not been utilizing CPAP in quite some time.  Continue supplemental oxygen as needed.  Patient may warrant new sleep study as an outpatient  Code Status: Full Code Family Communication: N/A Disposition Plan: Not yet ready for discharge  Paxon Propes Rennis Petty  APRN, MSN,  FNP-C [facility] [address] [place], [place] [address] [phone]  If 5PM-7AM, please contact night-coverage.  11/27/2019, 12:36 PM  LOS: 0 days

## 2019-11-27 NOTE — ED Notes (Signed)
Pt placed on 2L [place] due to hypoxia while sleeping. Saturation went down to 85%, improved to 98% on oxygen

## 2019-11-27 NOTE — ED Provider Notes (Signed)
[facility]    CSN: [id number] Arrival date & time: 11/26/19  1847      History   Chief Complaint Chief Complaint  Patient presents with  . Abdominal Pain    HPI Dean Webb is a 44 y.o. male.   Here today with 2 day hx of severe RUQ abdominal pain, anorexia, fever, pleuritic CP. Denies wheezing, SOB, V/D, rashes, recent sick contacts, new foods, recent travel. Hx of sickle cell with acute chest syndrome/pain crises and states this feels similar to those. Taking his standard pain regimen at home without relief. Pain is reported 10/10.      Past Medical History:  Diagnosis Date  . Asthma   . GERD (gastroesophageal reflux disease)   . Hb-S/Hb-C disease (HCC) 1979  . HTN (hypertension) 2008  . Hypertension   . Sickle cell anemia (HCC)   . Sickle cell disease [facility])     Patient Active Problem List   Diagnosis Date Noted  . Sickle cell crisis acute chest syndrome (HCC) 11/27/2019  . Acute chest syndrome due to sickle-cell disease (HCC) 11/26/2019  . 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 Cocaine abuse (HCC) 08/08/2017  . H. pylori duodenitis 08/08/2017  . Sickle cell anemia with crisis (HCC) 07/11/2017  . Pneumobilia   . Sickle cell pain crisis (HCC) 04/29/2017  . CAP (community acquired pneumonia) 04/24/2017  . OSA (obstructive sleep apnea) 04/22/2017  . Mild asthma without complication 04/22/2017  . Essential hypertension 03/11/2016  . Chronic prescription opiate use 03/11/2016  . Precordial chest pain 03/08/2016  . Sickle cell disease (HCC)     Past Surgical History:  Procedure Laterality Date  . CHOLECYSTECTOMY         Home Medications    Prior to Admission medications   Medication Sig Start Date End Date Taking? Authorizing Provider  albuterol (VENTOLIN HFA) 108 (90 Base) MCG/ACT inhaler Inhale 2 puffs into the lungs every 6 (six) hours as needed for wheezing or shortness of  breath. Patient not taking: Reported on 11/19/2019 09/19/19 09/18/20  Barbette Merino, NP  amLODipine (NORVASC) 10 MG tablet Take 1 tablet (10 mg total) by mouth daily. 11/19/19 11/18/20  Barbette Merino, NP  cetirizine (ZYRTEC) 10 MG tablet Take 1 tablet (10 mg total) by mouth daily as needed for allergies. Patient not taking: Reported on 09/19/2019 09/17/17   Mike Gip, FNP  folic acid (FOLVITE) 1 MG tablet Take 1 tablet (1 mg total) by mouth daily. 09/19/19   Barbette Merino, NP  ibuprofen (ADVIL,MOTRIN) 800 MG tablet Take 1 tablet (800 mg total) by mouth every 8 (eight) hours as needed. Patient not taking: Reported on 09/19/2019 10/25/17   Mike Gip, FNP  nicotine (NICODERM CQ - DOSED IN MG/24 HOURS) 21 mg/24hr patch Place 1 patch (21 mg total) onto the skin daily. Patient not taking: Reported on 09/19/2019 09/17/17   Mike Gip, FNP  omeprazole (PRILOSEC) 20 MG capsule Take 1 capsule (20 mg total) by mouth 2 (two) times daily before a meal. Patient taking differently: Take 20 mg by mouth 2 (two) times daily as needed (indigestion).  09/19/19   Barbette Merino, NP  Oxycodone HCl 10 MG TABS Take 1 tablet (10 mg total) by mouth every 6 (six) hours as needed for up to 15 days. Patient taking differently: Take 10 mg by mouth every 6 (six) hours as needed (pain).  11/19/19 12/04/19  Barbette Merino, NP  Family History Family History  Problem Relation Age of Onset  . Sickle cell trait Mother   . Hypertension Mother   . Asthma Mother   . Sickle cell trait Father   . Congestive Heart Failure Father   . Hypertension Sister   . Sickle cell anemia Daughter   . Hypertension Daughter   . Diabetes Daughter     Social History Social History   Tobacco Use  . Smoking status: Current Every Day Smoker    Packs/day: 0.50    Years: 17.00    Pack years: 8.50  . Smokeless tobacco: Never Used  Vaping Use  . Vaping Use: Never used  Substance Use Topics  . Alcohol use: Yes    Comment: Occasional  drinker  . Drug use: Not Currently    Types: Marijuana     Allergies   Patient has no known allergies.   Review of Systems Review of Systems PER HPI    Physical Exam Triage Vital Signs ED Triage Vitals  Enc Vitals Group     BP 11/26/19 1939 (!) 150/113     Pulse Rate 11/26/19 1939 (!) 112     Resp 11/26/19 1939 19     Temp 11/26/19 1939 (!) 101.2 F (38.4 C)     Temp Source 11/26/19 1939 Oral     SpO2 11/26/19 1939 95 %     Weight --      Height --      Head Circumference --      Peak Flow --      Pain Score 11/26/19 1938 10     Pain Loc --      Pain Edu? --      Excl. in GC? --    No data found.  Updated Vital Signs BP (!) 150/113 (BP Location: Right Arm)   Pulse (!) 112   Temp (!) 101.2 F (38.4 C) (Oral)   Resp 19   SpO2 95%   Visual Acuity Right Eye Distance:   Left Eye Distance:   Bilateral Distance:    Right Eye Near:   Left Eye Near:    Bilateral Near:     Physical Exam Vitals and nursing note reviewed.  Constitutional:      General: He is in acute distress.     Appearance: He is diaphoretic.     Comments: Patient writhing in pain, in fetal position on exam table upon entering exam room  HENT:     Head: Atraumatic.     Mouth/Throat:     Mouth: Mucous membranes are moist.     Pharynx: Oropharynx is clear.  Cardiovascular:     Rate and Rhythm: Regular rhythm. Tachycardia present.  Pulmonary:     Effort: No respiratory distress.  Abdominal:     Comments: Unable to palpate abdomen due to patient's pain level - he declines this exam  Musculoskeletal:     Comments: Severe pain with movement today, limited orthopedic exam  Skin:    Findings: No erythema or rash.  Neurological:     Comments: Exam limited by patient's severe pain      UC Treatments / Results  Labs (all labs ordered are listed, but only abnormal results are displayed) Labs Reviewed - No data to display  EKG   Radiology DG Chest 2 View  Result Date:  11/26/2019 CLINICAL DATA:  Dyspnea, sickle cell disease, right chest pain EXAM: CHEST - 2 VIEW COMPARISON:  08/08/2017 FINDINGS: The lungs are symmetrically well expanded. There  is focal consolidation within the right lateral lung base, acute chest syndrome versus is infection. Minimal residual scarring at the left costophrenic angle. The lungs are otherwise clear. No pneumothorax or pleural effusion. Cardiac size is within normal limits. No acute bone abnormality. IMPRESSION: Focal consolidation at the right costophrenic angle, acute chest syndrome versus acute infection. Electronically Signed   By: Helyn Numbers MD   On: 11/26/2019 20:59    Procedures Procedures (including critical care time)  Medications Ordered in UC Medications  acetaminophen (TYLENOL) tablet 650 mg (650 mg Oral Given 11/26/19 1944)    Initial Impression / Assessment and Plan / UC Course  I have reviewed the triage vital signs and the nursing notes.  Pertinent labs & imaging results that were available during my care of the patient were reviewed by me and considered in my medical decision making (see chart for details).     Patient is febrile, tachycardic, hypertensive and in 10/10 pain subjectively. Discussed limitations of this setting in further evaluating his current sxs as well as managing his pain and hydration. Suspect pain crisis related to his sickle cell disease but cannot r/o abdominal pathology. He is agreeable with recommendation to seek care immediately in ED. Declines EMS, has a ride waiting for him in the lobby that can take him to ED.   Final Clinical Impressions(s) / UC Diagnoses   Final diagnoses:  RUQ pain  Fever, unspecified   Discharge Instructions   None    ED Prescriptions    None     PDMP not reviewed this encounter.   Particia Nearing, [place] 11/27/19 1624

## 2019-11-28 LAB — BASIC METABOLIC PANEL
Anion gap: 9 (ref 5–15)
BUN: 11 mg/dL (ref 6–20)
CO2: 22 mmol/L (ref 22–32)
Calcium: 9.4 mg/dL (ref 8.9–10.3)
Chloride: 102 mmol/L (ref 98–111)
Creatinine, Ser: 1.23 mg/dL (ref 0.61–1.24)
GFR, Estimated: >60 mL/min (ref >60)
Glucose, Bld: 94 mg/dL (ref 70–99)
Potassium: 4.3 mmol/L (ref 3.5–5.1)
Sodium: 133 mmol/L — ABNORMAL LOW (ref 135–145)

## 2019-11-28 LAB — CBC
HCT: 30.7 % — ABNORMAL LOW (ref 39.0–52.0)
Hemoglobin: 11.4 g/dL — ABNORMAL LOW (ref 13.0–17.0)
MCH: 28.1 pg (ref 26.0–34.0)
MCHC: 37.1 g/dL — ABNORMAL HIGH (ref 30.0–36.0)
MCV: 75.8 fL — ABNORMAL LOW (ref 80.0–100.0)
Platelets: 210 10*3/uL (ref 150–400)
RBC: 4.05 MIL/uL — ABNORMAL LOW (ref 4.22–5.81)
RDW: 17.8 % — ABNORMAL HIGH (ref 11.5–15.5)
WBC: 12.3 10*3/uL — ABNORMAL HIGH (ref 4.0–10.5)
nRBC: 3.1 % — ABNORMAL HIGH (ref 0.0–0.2)

## 2019-11-28 MED ORDER — ADULT MULTIVITAMIN W/MINERALS CH
1.0000 | ORAL_TABLET | Freq: Every day | ORAL | Status: DC
  Administered 2019-11-28 – 2019-12-02 (×5): 1 via ORAL
  Filled 2019-11-28 (×5): qty 1

## 2019-11-28 MED ORDER — PROSOURCE PLUS PO LIQD
30.0000 mL | Freq: Two times a day (BID) | ORAL | Status: DC
  Administered 2019-11-28 – 2019-12-02 (×5): 30 mL via ORAL
  Filled 2019-11-28 (×5): qty 30

## 2019-11-28 MED ORDER — AMLODIPINE BESYLATE 10 MG PO TABS
10.0000 mg | ORAL_TABLET | Freq: Every day | ORAL | Status: DC
  Administered 2019-11-28 – 2019-12-02 (×5): 10 mg via ORAL
  Filled 2019-11-28 (×5): qty 1

## 2019-11-28 MED ORDER — ACETAMINOPHEN 325 MG PO TABS
650.0000 mg | ORAL_TABLET | Freq: Four times a day (QID) | ORAL | Status: DC | PRN
  Administered 2019-11-28: 650 mg via ORAL
  Filled 2019-11-28: qty 2

## 2019-11-28 MED ORDER — BOOST / RESOURCE BREEZE PO LIQD CUSTOM
1.0000 | Freq: Three times a day (TID) | ORAL | Status: DC
  Administered 2019-11-28 – 2019-12-02 (×8): 1 via ORAL

## 2019-11-28 NOTE — Progress Notes (Signed)
Initial Nutrition Assessment  DOCUMENTATION CODES:   Obesity unspecified  INTERVENTION:  Boost Breeze po TID, each supplement provides 250 kcal and 9 grams of protein  46ml Prosource Plus po BID, each supplement provides 100 kcal and 15 grams of protein  MVI daily   NUTRITION DIAGNOSIS:   Inadequate oral intake related to other (see comment) (diet order) as evidenced by other (comment) (clear liquid diet).    GOAL:   Patient will meet greater than or equal to 90% of their needs    MONITOR:   PO intake, Supplement acceptance, Diet advancement, Weight trends, Labs, I & O's  REASON FOR ASSESSMENT:   Malnutrition Screening Tool    ASSESSMENT:   Pt admitted with acute chest syndrome 2/2 acute sickle cell pain crisis. PMH includes sickle cell disease, HTN, asthma, OSA.   Pt reports being in his usual state of health until ~2 days PTA. Pt denies N/V/D and abdominal pain. Pt currently on a clear liquid diet with directions to advance as tolerated. No PO intake documented. Will order Boost Breeze TID and Prosource Plus BID to provide additional kcals/protein for pt as clear liquid diet is very limited. Once diet is advanced, may transition pt to Ensure Enlive.    Reviewed wt history. No significant wt changes noted.   UOP: x24 hours I/O: +1591.16ml since admit  Labs: Na 133 (L) Medications: pepcid, folvite, senokot-s  NUTRITION - FOCUSED PHYSICAL EXAM:  Unable to perform at this time; RD working from another campus. Will attempt at follow-up.  Diet Order:   Diet Order            Diet clear liquid Room service appropriate? Yes; Fluid consistency: Thin  Diet effective now                 EDUCATION NEEDS:   No education needs have been identified at this time  Skin:  Skin Assessment: Reviewed RN Assessment  Last BM:  10/14  Height:   Ht Readings from Last 1 Encounters:  11/26/19 5\' 7"  (1.702 m)    Weight:   Wt Readings from Last 1 Encounters:   11/26/19 90.7 kg    Ideal Body Weight:  67.27 kg  BMI:  Body mass index is 31.32 kg/m.  Estimated Nutritional Needs:   Kcal:  2150-2350  Protein:  115-135 grams  Fluid:  >2L/d    01-21-1990, MS, RD, LDN RD pager number and weekend/on-call pager number located in Amion.

## 2019-11-28 NOTE — Progress Notes (Signed)
Pt stable for transport. Full report given to RN Selena Batten of [facility]. Will transfer patient via wheelchair.

## 2019-11-28 NOTE — TOC Initial Note (Signed)
Transition of Care [facility]) - Initial/Assessment Note    Patient Details  Name: Dean Webb MRN: [id number] Date of Birth: [birth year]  [facility] of [facility]) CM/SW Contact:    Golda Acre, RN Phone Number: 11/28/2019, 12:48 PM  Clinical Narrative:                 44 y.o. male with medical history significant for sickle cell disease, hypertension, asthma, OSA who presents to the ED for evaluation of fevers and dyspnea.  Patient reports new onset of right lower chest wall pain and dyspnea beginning evening of 10/12.  He has had associated fevers, chills, and occasional dry cough.  He denies any left-sided chest pain, nausea, vomiting, abdominal pain, dysuria, diarrhea, or swelling in his legs.  He says this feels similar to his previous sickle cell pain episodes. Following for progression and toc needs. Expected Discharge Plan: Home/Self Care Barriers to Discharge: Barriers Unresolved (comment)   Patient Goals and CMS Choice Patient states their goals for this hospitalization and ongoing recovery are:: to go home CMS Medicare.gov Compare Post Acute Care list provided to:: Patient    Expected Discharge Plan and Services Expected Discharge Plan: Home/Self Care   Discharge Planning Services: CM Consult   Living arrangements for the past 2 months: Single Family Home                                      Prior Living Arrangements/Services Living arrangements for the past 2 months: Single Family Home Lives with:: Self Patient language and need for interpreter reviewed:: Yes Do you feel safe going back to the place where you live?: Yes      Need for Family Participation in Patient Care: Yes (Comment) Care giver support system in place?: Yes (comment)   Criminal Activity/Legal Involvement Pertinent to Current Situation/Hospitalization: No - Comment as needed  Activities of Daily Living Home Assistive Devices/Equipment: None ADL Screening (condition at time of  admission) Patient's cognitive ability adequate to safely complete daily activities?: Yes Is the patient deaf or have difficulty hearing?: No Does the patient have difficulty seeing, even when wearing glasses/contacts?: No Does the patient have difficulty concentrating, remembering, or making decisions?: Yes (very lethargic) Patient able to express need for assistance with ADLs?: Yes Does the patient have difficulty dressing or bathing?: Yes Independently performs ADLs?: No Communication: Independent Dressing (OT): Needs assistance Is this a change from baseline?: Change from baseline, expected to last >3 days Grooming: Independent Feeding: Independent Bathing: Needs assistance Is this a change from baseline?: Change from baseline, expected to last >3 days Toileting: Needs assistance Is this a change from baseline?: Change from baseline, expected to last >3days In/Out Bed: Needs assistance Is this a change from baseline?: Change from baseline, expected to last >3 days Walks in Home: Needs assistance Is this a change from baseline?: Change from baseline, expected to last >3 days Does the patient have difficulty walking or climbing stairs?: Yes Weakness of Legs: Both Weakness of Arms/Hands: Both  Permission Sought/Granted                  Emotional Assessment Appearance:: Appears stated age Attitude/Demeanor/Rapport: Engaged Affect (typically observed): Calm Orientation: : Oriented to Place, Oriented to Self, Oriented to  Time, Oriented to Situation Alcohol / Substance Use: Not Applicable    Admission diagnosis:  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 Sickle cell crisis acute chest syndrome (HCC) 11/27/2019  . Acute chest syndrome due to sickle-cell disease (HCC) 11/26/2019  . Acute chest syndrome due to  sickle cell-hemoglobin C disease with crisis (HCC) 08/08/2017  . Cocaine abuse (HCC) 08/08/2017  . H. pylori duodenitis 08/08/2017  . Sickle cell anemia with crisis (HCC) 07/11/2017  . Pneumobilia   . Sickle cell pain crisis (HCC) 04/29/2017  . CAP (community acquired pneumonia) 04/24/2017  . OSA (obstructive sleep apnea) 04/22/2017  . Mild asthma without complication 04/22/2017  . Essential hypertension 03/11/2016  . Chronic prescription opiate use 03/11/2016  . Precordial chest pain 03/08/2016  . Sickle cell disease (HCC)    PCP:  Barbette Merino, NP Pharmacy:   [facility] [address] [place] - [address] [place] [address] Phone: [phone] Fax: [phone]  [facility] - [place], [place] - [address] [place] [address] Phone: [phone] Fax: [phone]  [id number] - [place], [place] - [address] AT [facility] [address] [place] [address] Phone: [phone] Fax: [phone]  [facility] - [place], [place] - [address] [place] [address] Phone: [phone] Fax: [phone]  [id number] [place], [place] - [address] AT [facility] OF [address] [place] [address] Phone: [phone] Fax: [phone]  [id number] - [place], [place] - [address] [place] [address] Phone: [phone] Fax: [phone]     Social Determinants of Health (SDOH) Interventions    Readmission Risk Interventions No flowsheet data found.

## 2019-11-28 NOTE — Progress Notes (Signed)
Subjective: Dean Webb is a 44 year old male with a medical history significant for sickle cell disease type [place], hypertension, tobacco dependence, history of anemia of chronic disease, history of chronic pain syndrome was admitted for acute chest syndrome in the setting of sickle cell pain crisis.  Patient states that pain has improved some overnight.  Pain is primarily to right rib cage and low back.  Pain intensity is 7/10.  He denies any headache, shortness of breath, persistent cough, dizziness, urinary symptoms, nausea, vomiting, or diarrhea.  Objective:  Vital signs in last 24 hours:  Vitals:   11/28/19 0800 11/28/19 0827 11/28/19 1140 11/28/19 1209  BP: (!) 147/95  127/90   Pulse: (!) 103     Resp: 15  12   Temp:  98.2 F (36.8 C)  98.2 F (36.8 C)  TempSrc:  Oral  Oral  SpO2: 100%     Weight:      Height:        Intake/Output from previous day:   Intake/Output Summary (Last 24 hours) at 11/28/2019 1350 Last data filed at 11/28/2019 0700 Gross per 24 hour  Intake 2021.33 ml  Output 1650 ml  Net 371.33 ml    Physical Exam: General: Alert, awake, oriented x3, in no acute distress.  HEENT: [place]/AT PEERL, EOMI Neck: Trachea midline,  no masses, no thyromegal,y no JVD, no carotid bruit OROPHARYNX:  Moist, No exudate/ erythema/lesions.  Heart: Regular rate and rhythm, without murmurs, rubs, gallops, PMI non-displaced, no heaves or thrills on palpation.  Lungs: Clear to auscultation, no wheezing or rhonchi noted. No increased vocal fremitus resonant to percussion  Abdomen: Soft, nontender, nondistended, positive bowel sounds, no masses no hepatosplenomegaly noted..  Neuro: No focal neurological deficits noted cranial nerves II through XII grossly intact. DTRs 2+ bilaterally upper and lower extremities. Strength 5 out of 5 in bilateral upper and lower extremities. Musculoskeletal: No warm swelling or erythema around joints, no spinal tenderness noted. Psychiatric: Patient  alert and oriented x3, good insight and cognition, good recent to remote recall. Lymph node survey: No cervical axillary or inguinal lymphadenopathy noted.  Lab Results:  Basic Metabolic Panel:    Component Value Date/Time   NA 133 (L) 11/28/2019 0602   NA 136 09/19/2019 1509   K 4.3 11/28/2019 0602   CL 102 11/28/2019 0602   CO2 22 11/28/2019 0602   BUN 11 11/28/2019 0602   BUN 7 09/19/2019 1509   CREATININE 1.23 11/28/2019 0602   GLUCOSE 94 11/28/2019 0602   CALCIUM 9.4 11/28/2019 0602   CBC:    Component Value Date/Time   WBC 12.3 (H) 11/28/2019 0602   HGB 11.4 (L) 11/28/2019 0602   HGB 14.0 09/19/2019 1509   HCT 30.7 (L) 11/28/2019 0602   HCT 40.9 09/19/2019 1509   PLT 210 11/28/2019 0602   PLT 330 09/19/2019 1509   MCV 75.8 (L) 11/28/2019 0602   MCV 86 09/19/2019 1509   NEUTROABS 4.6 09/19/2019 1509   LYMPHSABS 3.5 (H) 09/19/2019 1509   MONOABS 1.4 (H) 08/11/2017 0328   EOSABS 0.4 09/19/2019 1509   BASOSABS 0.1 09/19/2019 1509    Recent Results (from the past 240 hour(s))  Respiratory Panel by RT PCR (Flu A&B, Covid) - Nasopharyngeal Swab     Status: None   Collection Time: 11/26/19  8:58 PM   Specimen: Nasopharyngeal Swab  Result Value Ref Range Status   SARS Coronavirus 2 by RT PCR NEGATIVE NEGATIVE Final    Comment: (NOTE) SARS-CoV-2 target  nucleic acids are NOT DETECTED.  The SARS-CoV-2 RNA is generally detectable in upper respiratoy specimens during the acute phase of infection. The lowest concentration of SARS-CoV-2 viral copies this assay can detect is 131 copies/mL. A negative result does not preclude SARS-Cov-2 infection and should not be used as the sole basis for treatment or other patient management decisions. A negative result may occur with  improper specimen collection/handling, submission of specimen other than nasopharyngeal swab, presence of viral mutation(s) within the areas targeted by this assay, and inadequate number of viral  copies (<131 copies/mL). A negative result must be combined with clinical observations, patient history, and epidemiological information. The expected result is Negative.  Fact Sheet for Patients:  https://www.moore.com/https://www.fda.gov/media/142436/download  Fact Sheet for Healthcare Providers:  https://www.young.biz/https://www.fda.gov/media/142435/download  This test is no t yet approved or cleared by the [place] FDA and  has been authorized for detection and/or diagnosis of SARS-CoV-2 by FDA under an Emergency Use Authorization (EUA). This EUA will remain  in effect (meaning this test can be used) for the duration of the COVID-19 declaration under Section 564(b)(1) of the Act, 21 U.S.C. section 360bbb-3(b)(1), unless the authorization is terminated or revoked sooner.     Influenza A by PCR NEGATIVE NEGATIVE Final   Influenza B by PCR NEGATIVE NEGATIVE Final    Comment: (NOTE) The Xpert Xpress SARS-CoV-2/FLU/RSV assay is intended as an aid in  the diagnosis of influenza from Nasopharyngeal swab specimens and  should not be used as a sole basis for treatment. Nasal washings and  aspirates are unacceptable for Xpert Xpress SARS-CoV-2/FLU/RSV  testing.  Fact Sheet for Patients: https://www.moore.com/https://www.fda.gov/media/142436/download  Fact Sheet for Healthcare Providers: https://www.young.biz/https://www.fda.gov/media/142435/download  This test is not yet approved or cleared by the [place] FDA and  has been authorized for detection and/or diagnosis of SARS-CoV-2 by  FDA under an Emergency Use Authorization (EUA). This EUA will remain  in effect (meaning this test can be used) for the duration of the  Covid-19 declaration under Section 564(b)(1) of the Act, 21  U.S.C. section 360bbb-3(b)(1), unless the authorization is  terminated or revoked. Performed at [facility], [address] [place], [place] [address]   Culture, blood (routine x 2)     Status: None (Preliminary result)   Collection Time: 11/27/19  1:20 AM    Specimen: BLOOD  Result Value Ref Range Status   Specimen Description   Final    BLOOD BLOOD RIGHT FOREARM Performed at [facility], [address] [place], [place] [address]    Special Requests   Final    BOTTLES DRAWN AEROBIC AND ANAEROBIC Blood Culture adequate volume Performed at [facility], [address] [place], [place] [address]    Culture   Final    NO GROWTH 1 DAY Performed at [facility], [address] [place], [place] [address]    Report Status PENDING  Incomplete  Culture, blood (routine x 2)     Status: None (Preliminary result)   Collection Time: 11/27/19  6:13 AM   Specimen: BLOOD  Result Value Ref Range Status   Specimen Description   Final    BLOOD RIGHT ANTECUBITAL Performed at [facility], [address] [place], [place] [address]    Special Requests   Final    BOTTLES DRAWN AEROBIC AND ANAEROBIC Blood Culture adequate volume Performed at [facility], [address] [place], [place] [address]    Culture   Final  NO GROWTH < 24 HOURS Performed at [facility], [address] [place], [place] [address]    Report Status PENDING  Incomplete  MRSA PCR Screening     Status: None   Collection Time: 11/27/19  9:10 AM   Specimen: Nasal Mucosa; Nasopharyngeal  Result Value Ref Range Status   MRSA by PCR NEGATIVE NEGATIVE Final    Comment:        The GeneXpert MRSA Assay (FDA approved for NASAL specimens only), is one component of a comprehensive MRSA colonization surveillance program. It is not intended to diagnose MRSA infection nor to guide or monitor treatment for MRSA infections. Performed at [facility], [address], [place], [place] [address]     Studies/Results: DG Chest 2 View  Result Date: 11/26/2019 CLINICAL DATA:  Dyspnea, sickle cell disease, right chest pain EXAM: CHEST - 2 VIEW COMPARISON:  08/08/2017 FINDINGS: The  lungs are symmetrically well expanded. There is focal consolidation within the right lateral lung base, acute chest syndrome versus is infection. Minimal residual scarring at the left costophrenic angle. The lungs are otherwise clear. No pneumothorax or pleural effusion. Cardiac size is within normal limits. No acute bone abnormality. IMPRESSION: Focal consolidation at the right costophrenic angle, acute chest syndrome versus acute infection. Electronically Signed   By: Helyn Numbers MD   On: 11/26/2019 20:59    Medications: Scheduled Meds: . (feeding supplement) PROSource Plus  30 mL Oral BID BM  . amLODipine  10 mg Oral Daily  . Chlorhexidine Gluconate Cloth  6 each Topical Daily  . enoxaparin (LOVENOX) injection  40 mg Subcutaneous Q24H  . famotidine  20 mg Oral Q12H  . feeding supplement  1 Container Oral TID BM  . folic acid  1 mg Oral Daily  . ketorolac  15 mg Intravenous Q6H  . multivitamin with minerals  1 tablet Oral Daily  . nicotine  21 mg Transdermal Daily  . senna-docusate  1 tablet Oral BID   Continuous Infusions: . sodium chloride 100 mL/hr at 11/27/19 2222  . azithromycin Stopped (11/27/19 2217)  . cefTRIAXone (ROCEPHIN)  IV Stopped (11/27/19 2252)  . diphenhydrAMINE    . famotidine (PEPCID) IV Stopped (11/27/19 0148)   PRN Meds:.albuterol, diphenhydrAMINE **OR** diphenhydrAMINE, HYDROmorphone (DILAUDID) injection, ondansetron, oxyCODONE, polyethylene glycol, promethazine **OR** promethazine  Consultants:  None  Procedures:  None  Antibiotics:  IV Rocephin  IV azithromycin  Assessment/Plan: Principal Problem:   Acute chest syndrome due to sickle-cell disease (HCC) Active Problems:   Essential hypertension   OSA (obstructive sleep apnea)   Sickle cell pain crisis (HCC)   Sickle cell crisis acute chest syndrome (HCC)  CAP vs acute chest syndrome:  Blood cultures continue to be negative.  COVID-19 negative.  Oxygen saturation is 98% on RA. Continue IV  antibiotics, consider transitioning to oral antibiotics on tomorrow if patient remains afebrile overnight Continue incentive spirometer Tylenol 650 mg every 6 hours as needed for fever Hemodynamically stable, transfer to telemetry  Sickle cell disease with pain crisis: Continue IV Dilaudid 1 mg every 2 hours as needed IV fluids, 0.45% saline at 100 mL/h Toradol 15 mg IV every 6 hours for total of 5 days Oxycodone 10 mg every 4 hours as needed for severe breakthrough pain Monitor vital signs closely, reevaluate pain scale regularly, and supplemental oxygen as needed.  Essential hypertension: Stable.  Continue home medications.  Sickle cell anemia: Hemoglobin is stable and consistent with patient's baseline.  There is no clinical  occasion for blood transfusion at this time.  Continue to follow closely.  Tobacco dependence: Smoking cessation instruction/counseling given:  counseled patient on the dangers of tobacco use, advised patient to stop smoking, and reviewed strategies to maximize success   History of OSA: Patient has not been utilizing CPAP at home.  Continue supplemental oxygen as needed.  He may warrant new sleep study as an outpatient    Code Status: Full Code Family Communication: N/A Disposition Plan: Not yet ready for discharge  Jonerik Sliker Rennis Petty  APRN, MSN, FNP-C [facility] [address] [place], [place] [address] [phone]  If 5PM-7AM, please contact night-coverage.  11/28/2019, 1:50 PM  LOS: 1 day

## 2019-11-29 DIAGNOSIS — D5700 Hb-SS disease with crisis, unspecified: Secondary | ICD-10-CM

## 2019-11-29 DIAGNOSIS — G4733 Obstructive sleep apnea (adult) (pediatric): Secondary | ICD-10-CM | POA: Diagnosis not present

## 2019-11-29 DIAGNOSIS — D5701 Hb-SS disease with acute chest syndrome: Secondary | ICD-10-CM | POA: Diagnosis not present

## 2019-11-29 DIAGNOSIS — I10 Essential (primary) hypertension: Secondary | ICD-10-CM | POA: Diagnosis not present

## 2019-11-29 NOTE — Progress Notes (Signed)
Patient ID: Dean Webb, male   DOB: 09/16/1975, 44 y.o.   MRN: [id number] Subjective: Dean Webb is a 44 year old male with a medical history significant for sickle cell disease type [place], hypertension, tobacco dependence, history of anemia of chronic disease, history of chronic pain syndrome was admitted for acute chest syndrome in the setting of sickle cell pain crisis.  Patient has had difficulty maintaining his IV site, currently without IV.  Patient spiked temperature yesterday and this morning, up to 101.1 F.  Patient continues to complain of significant pain, in his rib cages and low back.  He rates his pain at 8/10 this morning.  He denies any headache, shortness of breath, persistent cough, urinary symptoms, nausea, vomiting or diarrhea.  Objective:  Vital signs in last 24 hours:  Vitals:   11/28/19 1644 11/28/19 2050 11/29/19 0018 11/29/19 0427  BP: 134/90 116/80 113/78 (!) 138/93  Pulse: 100 71 80 96  Resp: 20 20 20 20   Temp: (!) 101.1 F (38.4 C) 98.7 F (37.1 C) 98.1 F (36.7 C) (!) 100.9 F (38.3 C)  TempSrc: Oral Oral  Oral  SpO2: 100% 100% 100% 100%  Weight:      Height:        Intake/Output from previous day:   Intake/Output Summary (Last 24 hours) at 11/29/2019 1350 Last data filed at 11/29/2019 0428 Gross per 24 hour  Intake 897.71 ml  Output 1550 ml  Net -652.29 ml    Physical Exam: General: Alert, awake, oriented x3, in no acute distress.  HEENT: [place]/AT PEER[place], EOMI Neck: Trachea midline,  no masses, no thyromegal,y no JVD, no carotid bruit OROPHARYNX:  Moist, No exudate/ erythema/lesions.  Heart: Regular rate and rhythm, without murmurs, rubs, gallops, PMI non-displaced, no heaves or thrills on palpation.  Lungs: Clear to auscultation, no wheezing or rhonchi noted. No increased vocal fremitus resonant to percussion  Abdomen: Soft, nontender, nondistended, positive bowel sounds, no masses no hepatosplenomegaly noted..  Neuro: No focal  neurological deficits noted cranial nerves II through XII grossly intact. DTRs 2+ bilaterally upper and lower extremities. Strength 5 out of 5 in bilateral upper and lower extremities. Musculoskeletal: No warm swelling or erythema around joints, no spinal tenderness noted. Psychiatric: Patient has flat affect. Patient alert and oriented x3, good insight and cognition, good recent to remote recall. Lymph node survey: No cervical axillary or inguinal lymphadenopathy noted.  Lab Results:  Basic Metabolic Panel:    Component Value Date/Time   NA 133 (L) 11/28/2019 0602   NA 136 09/19/2019 1509   K 4.3 11/28/2019 0602   CL 102 11/28/2019 0602   CO2 22 11/28/2019 0602   BUN 11 11/28/2019 0602   BUN 7 09/19/2019 1509   CREATININE 1.23 11/28/2019 0602   GLUCOSE 94 11/28/2019 0602   CALCIUM 9.4 11/28/2019 0602   CBC:    Component Value Date/Time   WBC 12.3 (H) 11/28/2019 0602   HGB 11.4 (L) 11/28/2019 0602   HGB 14.0 09/19/2019 1509   HCT 30.7 (L) 11/28/2019 0602   HCT 40.9 09/19/2019 1509   PLT 210 11/28/2019 0602   PLT 330 09/19/2019 1509   MCV 75.8 (L) 11/28/2019 0602   MCV 86 09/19/2019 1509   NEUTROABS 4.6 09/19/2019 1509   LYMPHSABS 3.5 (H) 09/19/2019 1509   MONOABS 1.4 (H) 08/11/2017 0328   EOSABS 0.4 09/19/2019 1509   BASOSABS 0.1 09/19/2019 1509    Recent Results (from the past 240 hour(s))  Respiratory Panel by RT PCR (  Flu A&B, Covid) - Nasopharyngeal Swab     Status: None   Collection Time: 11/26/19  8:58 PM   Specimen: Nasopharyngeal Swab  Result Value Ref Range Status   SARS Coronavirus 2 by RT PCR NEGATIVE NEGATIVE Final    Comment: (NOTE) SARS-CoV-2 target nucleic acids are NOT DETECTED.  The SARS-CoV-2 RNA is generally detectable in upper respiratoy specimens during the acute phase of infection. The lowest concentration of SARS-CoV-2 viral copies this assay can detect is 131 copies/mL. A negative result does not preclude SARS-Cov-2 infection and should not  be used as the sole basis for treatment or other patient management decisions. A negative result may occur with  improper specimen collection/handling, submission of specimen other than nasopharyngeal swab, presence of viral mutation(s) within the areas targeted by this assay, and inadequate number of viral copies (<131 copies/mL). A negative result must be combined with clinical observations, patient history, and epidemiological information. The expected result is Negative.  Fact Sheet for Patients:  https://www.moore.com/  Fact Sheet for Healthcare Providers:  https://www.young.biz/  This test is no t yet approved or cleared by the [place] FDA and  has been authorized for detection and/or diagnosis of SARS-CoV-2 by FDA under an Emergency Use Authorization (EUA). This EUA will remain  in effect (meaning this test can be used) for the duration of the COVID-19 declaration under Section 564(b)(1) of the Act, 21 U.S.C. section 360bbb-3(b)(1), unless the authorization is terminated or revoked sooner.     Influenza A by PCR NEGATIVE NEGATIVE Final   Influenza B by PCR NEGATIVE NEGATIVE Final    Comment: (NOTE) The Xpert Xpress SARS-CoV-2/FLU/RSV assay is intended as an aid in  the diagnosis of influenza from Nasopharyngeal swab specimens and  should not be used as a sole basis for treatment. Nasal washings and  aspirates are unacceptable for Xpert Xpress SARS-CoV-2/FLU/RSV  testing.  Fact Sheet for Patients: https://www.moore.com/  Fact Sheet for Healthcare Providers: https://www.young.biz/  This test is not yet approved or cleared by the [place] FDA and  has been authorized for detection and/or diagnosis of SARS-CoV-2 by  FDA under an Emergency Use Authorization (EUA). This EUA will remain  in effect (meaning this test can be used) for the duration of the  Covid-19 declaration under Section  564(b)(1) of the Act, 21  U.S.C. section 360bbb-3(b)(1), unless the authorization is  terminated or revoked. Performed at [facility], [address] [place], [place] [address]   Culture, blood (routine x 2)     Status: None (Preliminary result)   Collection Time: 11/27/19  1:20 AM   Specimen: BLOOD  Result Value Ref Range Status   Specimen Description   Final    BLOOD BLOOD RIGHT FOREARM Performed at [facility], [address], [place], [place] [address]    Special Requests   Final    BOTTLES DRAWN AEROBIC AND ANAEROBIC Blood Culture adequate volume Performed at [facility], [address] [place], [place] [address]    Culture   Final    NO GROWTH 2 DAYS Performed at [facility], [address], [place], [place] [address]    Report Status PENDING  Incomplete  Culture, blood (routine x 2)     Status: None (Preliminary result)   Collection Time: 11/27/19  6:13 AM   Specimen: BLOOD  Result Value Ref Range Status   Specimen Description   Final    BLOOD RIGHT ANTECUBITAL Performed at [facility], [address] [place], [place] [address]    Special Requests   Final    BOTTLES DRAWN AEROBIC AND ANAEROBIC Blood Culture adequate volume Performed at [facility], [address] [place], [place] [address]    Culture   Final    NO GROWTH 2 DAYS Performed at [facility], [address], [place], [place] [address]    Report Status PENDING  Incomplete  MRSA PCR Screening     Status: None   Collection Time: 11/27/19  9:10 AM   Specimen: Nasal Mucosa; Nasopharyngeal  Result Value Ref Range Status   MRSA by PCR NEGATIVE NEGATIVE Final    Comment:        The GeneXpert MRSA Assay (FDA approved for NASAL specimens only), is one component of a comprehensive MRSA colonization surveillance program. It is not intended to diagnose MRSA infection nor to guide or monitor  treatment for MRSA infections. Performed at [facility], [address] [place], [place] [address]     Studies/Results: No results found.  Medications: Scheduled Meds: . (feeding supplement) PROSource Plus  30 mL Oral BID BM  . amLODipine  10 mg Oral Daily  . Chlorhexidine Gluconate Cloth  6 each Topical Daily  . enoxaparin (LOVENOX) injection  40 mg Subcutaneous Q24H  . famotidine  20 mg Oral Q12H  . feeding supplement  1 Container Oral TID BM  . folic acid  1 mg Oral Daily  . ketorolac  15 mg Intravenous Q6H  . multivitamin with minerals  1 tablet Oral Daily  . nicotine  21 mg Transdermal Daily  . senna-docusate  1 tablet Oral BID   Continuous Infusions: . sodium chloride 100 mL/hr at 11/29/19 0537  . azithromycin 500 mg (11/28/19 2333)  . cefTRIAXone (ROCEPHIN)  IV 1 g (11/28/19 2236)  . diphenhydrAMINE    . 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 CAP Vs Acute Chest Syndrome: Patient is hemodynamically stable.  Patient is saturating well even on room air, blood cultures are negative so far.  Patient is currently on IV antibiotics, will continue for now in view of persistent fever, T-max 101.5 F.  Patient encouraged on incentive spirometry.  Continue supplemental oxygen. 2. Hb Sickle Cell Disease with crisis: Continue gentle hydration at 50 mls/hour, continue IV Dilaudid 1 mg every  2 hours as needed, continue IV Toradol 15 mg Q 6 H for total of 5 days, continue oral home pain medications, monitor vitals very closely, Re-evaluate pain scale regularly, 2 L of Oxygen by [place]. 3. Essential hypertension: Controlled. Continue home medications. 4. Sickle Cell Anemia: Hemoglobin is stable at baseline. There is no clinical indication for blood transfusion today. We will continue to monitor closely. 5. Chronic pain Syndrome: Continue oral home pain medication  Code Status: Full Code Family Communication: N/A Disposition Plan: Not yet ready for discharge  Maisley Hainsworth  If 7PM-7AM, please contact night-coverage.  11/29/2019, 1:50 PM  LOS: 2 days

## 2019-11-30 DIAGNOSIS — I10 Essential (primary) hypertension: Secondary | ICD-10-CM | POA: Diagnosis not present

## 2019-11-30 DIAGNOSIS — G4733 Obstructive sleep apnea (adult) (pediatric): Secondary | ICD-10-CM | POA: Diagnosis not present

## 2019-11-30 DIAGNOSIS — D5700 Hb-SS disease with crisis, unspecified: Secondary | ICD-10-CM | POA: Diagnosis not present

## 2019-11-30 DIAGNOSIS — D5701 Hb-SS disease with acute chest syndrome: Secondary | ICD-10-CM | POA: Diagnosis not present

## 2019-11-30 MED ORDER — AZITHROMYCIN 250 MG PO TABS
500.0000 mg | ORAL_TABLET | Freq: Every day | ORAL | Status: AC
  Administered 2019-11-30 – 2019-12-01 (×2): 500 mg via ORAL
  Filled 2019-11-30 (×2): qty 2

## 2019-11-30 NOTE — Progress Notes (Signed)
Patient ID: Dean Webb, male   DOB: 04/26/1975, 44 y.o.   MRN: [id number] Subjective: Dean Webb is a 44 year old male with a medical history significant for sickle cell disease type [place], hypertension, tobacco dependence, history of anemia of chronic disease, history of chronic pain syndrome was admitted for acute chest syndrome in the setting of sickle cell pain crisis.  Patient has no new complaints today but still continues to have significant pain in his chest wall and lower back.  He rates his pain as 7/10.  Fever appears to have resolved.  He denies any headache, cough, shortness of breath, nausea, vomiting or diarrhea. Patient has not been ambulating except to the bathroom.  Objective:  Vital signs in last 24 hours:  Vitals:   11/29/19 2051 11/30/19 0441 11/30/19 0930 11/30/19 1319  BP: (!) 135/93 129/82 (!) 149/98 (!) 130/97  Pulse: 92 86 85 87  Resp: 16 14 18 14   Temp: 99.2 F (37.3 C) 99 F (37.2 C) 98 F (36.7 C) 99 F (37.2 C)  TempSrc: Oral Oral Oral Oral  SpO2: 96% 98% 99% 99%  Weight:      Height:        Intake/Output from previous day:   Intake/Output Summary (Last 24 hours) at 11/30/2019 1346 Last data filed at 11/30/2019 0935 Gross per 24 hour  Intake 3211.95 ml  Output 1375 ml  Net 1836.95 ml    Physical Exam: General: Alert, awake, oriented x3, in no acute distress.  HEENT: [place]/AT [place], EOMI Neck: Trachea midline,  no masses, no thyromegal,y no JVD, no carotid bruit OROPHARYNX:  Moist, No exudate/ erythema/lesions.  Heart: Regular rate and rhythm, without murmurs, rubs, gallops, PMI non-displaced, no heaves or thrills on palpation.  Lungs: Clear to auscultation, no wheezing or rhonchi noted. No increased vocal fremitus resonant to percussion  Abdomen: Soft, nontender, nondistended, positive bowel sounds, no masses no hepatosplenomegaly noted..  Neuro: No focal neurological deficits noted cranial nerves II through XII grossly intact. DTRs 2+  bilaterally upper and lower extremities. Strength 5 out of 5 in bilateral upper and lower extremities. Musculoskeletal: No warm swelling or erythema around joints, no spinal tenderness noted. Psychiatric: Patient has flat affect. Patient alert and oriented x3, good insight and cognition, good recent to remote recall. Lymph node survey: No cervical axillary or inguinal lymphadenopathy noted.  Lab Results:  Basic Metabolic Panel:    Component Value Date/Time   NA 133 (L) 11/28/2019 0602   NA 136 09/19/2019 1509   K 4.3 11/28/2019 0602   CL 102 11/28/2019 0602   CO2 22 11/28/2019 0602   BUN 11 11/28/2019 0602   BUN 7 09/19/2019 1509   CREATININE 1.23 11/28/2019 0602   GLUCOSE 94 11/28/2019 0602   CALCIUM 9.4 11/28/2019 0602   CBC:    Component Value Date/Time   WBC 12.3 (H) 11/28/2019 0602   HGB 11.4 (L) 11/28/2019 0602   HGB 14.0 09/19/2019 1509   HCT 30.7 (L) 11/28/2019 0602   HCT 40.9 09/19/2019 1509   PLT 210 11/28/2019 0602   PLT 330 09/19/2019 1509   MCV 75.8 (L) 11/28/2019 0602   MCV 86 09/19/2019 1509   NEUTROABS 4.6 09/19/2019 1509   LYMPHSABS 3.5 (H) 09/19/2019 1509   MONOABS 1.4 (H) 08/11/2017 0328   EOSABS 0.4 09/19/2019 1509   BASOSABS 0.1 09/19/2019 1509    Recent Results (from the past 240 hour(s))  Respiratory Panel by RT PCR (Flu A&B, Covid) - Nasopharyngeal Swab  Status: None   Collection Time: 11/26/19  8:58 PM   Specimen: Nasopharyngeal Swab  Result Value Ref Range Status   SARS Coronavirus 2 by RT PCR NEGATIVE NEGATIVE Final    Comment: (NOTE) SARS-CoV-2 target nucleic acids are NOT DETECTED.  The SARS-CoV-2 RNA is generally detectable in upper respiratoy specimens during the acute phase of infection. The lowest concentration of SARS-CoV-2 viral copies this assay can detect is 131 copies/mL. A negative result does not preclude SARS-Cov-2 infection and should not be used as the sole basis for treatment or other patient management decisions. A  negative result may occur with  improper specimen collection/handling, submission of specimen other than nasopharyngeal swab, presence of viral mutation(s) within the areas targeted by this assay, and inadequate number of viral copies (<131 copies/mL). A negative result must be combined with clinical observations, patient history, and epidemiological information. The expected result is Negative.  Fact Sheet for Patients:  https://www.moore.com/  Fact Sheet for Healthcare Providers:  https://www.young.biz/  This test is no t yet approved or cleared by the [place] FDA and  has been authorized for detection and/or diagnosis of SARS-CoV-2 by FDA under an Emergency Use Authorization (EUA). This EUA will remain  in effect (meaning this test can be used) for the duration of the COVID-19 declaration under Section 564(b)(1) of the Act, 21 U.S.C. section 360bbb-3(b)(1), unless the authorization is terminated or revoked sooner.     Influenza A by PCR NEGATIVE NEGATIVE Final   Influenza B by PCR NEGATIVE NEGATIVE Final    Comment: (NOTE) The Xpert Xpress SARS-CoV-2/FLU/RSV assay is intended as an aid in  the diagnosis of influenza from Nasopharyngeal swab specimens and  should not be used as a sole basis for treatment. Nasal washings and  aspirates are unacceptable for Xpert Xpress SARS-CoV-2/FLU/RSV  testing.  Fact Sheet for Patients: https://www.moore.com/  Fact Sheet for Healthcare Providers: https://www.young.biz/  This test is not yet approved or cleared by the [place] FDA and  has been authorized for detection and/or diagnosis of SARS-CoV-2 by  FDA under an Emergency Use Authorization (EUA). This EUA will remain  in effect (meaning this test can be used) for the duration of the  Covid-19 declaration under Section 564(b)(1) of the Act, 21  U.S.C. section 360bbb-3(b)(1), unless the authorization  is  terminated or revoked. Performed at [facility], [address], [place], [place] [address]   Culture, blood (routine x 2)     Status: None (Preliminary result)   Collection Time: 11/27/19  1:20 AM   Specimen: BLOOD  Result Value Ref Range Status   Specimen Description   Final    BLOOD BLOOD RIGHT FOREARM Performed at [facility], [address], [place], [place] [address]    Special Requests   Final    BOTTLES DRAWN AEROBIC AND ANAEROBIC Blood Culture adequate volume Performed at [facility], [address], [place], [place] [address]    Culture   Final    NO GROWTH 3 DAYS Performed at [facility], [address] [place], [place] [address]    Report Status PENDING  Incomplete  Culture, blood (routine x 2)     Status: None (Preliminary result)   Collection Time: 11/27/19  6:13 AM   Specimen: BLOOD  Result Value Ref Range Status   Specimen Description   Final    BLOOD RIGHT ANTECUBITAL Performed at [facility], [address], [place], [place] [address]  Special Requests   Final    BOTTLES DRAWN AEROBIC AND ANAEROBIC Blood Culture adequate volume Performed at [facility], [address], [place], [place] [address]    Culture   Final    NO GROWTH 3 DAYS Performed at [facility], [address] [place], [place] [address]    Report Status PENDING  Incomplete  MRSA PCR Screening     Status: None   Collection Time: 11/27/19  9:10 AM   Specimen: Nasal Mucosa; Nasopharyngeal  Result Value Ref Range Status   MRSA by PCR NEGATIVE NEGATIVE Final    Comment:        The GeneXpert MRSA Assay (FDA approved for NASAL specimens only), is one component of a comprehensive MRSA colonization surveillance program. It is not intended to diagnose MRSA infection nor to guide or monitor treatment for MRSA infections. Performed at [facility], [address], [place], [place] [address]     Studies/Results: No results found.  Medications: Scheduled Meds: . (feeding supplement) PROSource Plus  30 mL Oral BID BM  . amLODipine  10 mg Oral Daily  . Chlorhexidine Gluconate Cloth  6 each Topical Daily  . enoxaparin (LOVENOX) injection  40 mg Subcutaneous Q24H  . famotidine  20 mg Oral Q12H  . feeding supplement  1 Container Oral TID BM  . folic acid  1 mg Oral Daily  . ketorolac  15 mg Intravenous Q6H  . multivitamin with minerals  1 tablet Oral Daily  . nicotine  21 mg Transdermal Daily  . senna-docusate  1 tablet Oral BID   Continuous Infusions: . sodium chloride 50 mL/hr at 11/29/19 2122  . azithromycin 500 mg (11/29/19 2210)  . cefTRIAXone (ROCEPHIN)  IV 1 g (11/29/19 2323)  . diphenhydrAMINE    . famotidine (PEPCID) IV Stopped (11/27/19 0148)   PRN Meds:.acetaminophen, albuterol, diphenhydrAMINE **OR** diphenhydrAMINE, HYDROmorphone (DILAUDID) injection, ondansetron, oxyCODONE, polyethylene glycol, promethazine **OR** promethazine  Consultants:  None  Procedures:  None  Antibiotics:  IV Ceftriaxone  IV Azithromycin Changed to PO   Assessment/Plan: Principal Problem:   Acute chest syndrome due to sickle-cell disease (HCC) Active Problems:   Essential hypertension   OSA (obstructive sleep apnea)   Acute chest syndrome (HCC)   Sickle cell pain crisis (HCC)   Sickle cell crisis acute chest syndrome (HCC)  1. CAP Vs Acute Chest Syndrome: Patient is hemodynamically stable.  Patient is saturating well even on room air. Fever appears to have dissipates. Patient encouraged to ambulate on the hallway today. Patient encouraged to continue to use incentive spirometry. Continue supplemental oxygen. Continue IV ceftriaxone.  Change azithromycin to p.o. 2. Hb Sickle Cell Disease with crisis: Reduce IV fluid to [facility], will continue IV Dilaudid 1 mg every 2 hours as needed, continue IV Toradol 15 mg Q 6 H for total of 5 days,  continue oral home pain medications, monitor vitals very closely, Re-evaluate pain scale regularly, 2 L of Oxygen by [place]. 3. Essential hypertension: Continue home medications. 4. Sickle Cell Anemia: Hemoglobin remains stable at baseline. There is no clinical indication for blood transfusion today. We will continue to monitor closely.  Repeat labs in a.m. 5. Chronic pain Syndrome: Continue oral home pain medication.  Code Status: Full Code Family Communication: N/A Disposition Plan: Not yet ready for discharge  Veanna Dower  If 7PM-7AM, please contact night-coverage.  11/30/2019, 1:46 PM  LOS: 3 days

## 2019-12-01 LAB — CBC WITH DIFFERENTIAL/PLATELET
Abs Immature Granulocytes: 0.04 10*3/uL (ref 0.00–0.07)
Basophils Absolute: 0.1 10*3/uL (ref 0.0–0.1)
Basophils Relative: 1 %
Eosinophils Absolute: 0.4 10*3/uL (ref 0.0–0.5)
Eosinophils Relative: 4 %
HCT: 32.1 % — ABNORMAL LOW (ref 39.0–52.0)
Hemoglobin: 11.8 g/dL — ABNORMAL LOW (ref 13.0–17.0)
Immature Granulocytes: 1 %
Lymphocytes Relative: 29 %
Lymphs Abs: 2.4 10*3/uL (ref 0.7–4.0)
MCH: 27.4 pg (ref 26.0–34.0)
MCHC: 36.8 g/dL — ABNORMAL HIGH (ref 30.0–36.0)
MCV: 74.7 fL — ABNORMAL LOW (ref 80.0–100.0)
Monocytes Absolute: 1.3 10*3/uL — ABNORMAL HIGH (ref 0.1–1.0)
Monocytes Relative: 16 %
Neutro Abs: 4.1 10*3/uL (ref 1.7–7.7)
Neutrophils Relative %: 49 %
Platelets: 310 10*3/uL (ref 150–400)
RBC: 4.3 MIL/uL (ref 4.22–5.81)
RDW: 18.1 % — ABNORMAL HIGH (ref 11.5–15.5)
WBC: 8.2 10*3/uL (ref 4.0–10.5)
nRBC: 2.3 % — ABNORMAL HIGH (ref 0.0–0.2)

## 2019-12-01 LAB — COMPREHENSIVE METABOLIC PANEL
ALT: 22 U/L (ref 0–44)
AST: 23 U/L (ref 15–41)
Albumin: 3.6 g/dL (ref 3.5–5.0)
Alkaline Phosphatase: 76 U/L (ref 38–126)
Anion gap: 10 (ref 5–15)
BUN: 6 mg/dL (ref 6–20)
CO2: 23 mmol/L (ref 22–32)
Calcium: 10 mg/dL (ref 8.9–10.3)
Chloride: 103 mmol/L (ref 98–111)
Creatinine, Ser: 0.9 mg/dL (ref 0.61–1.24)
GFR, Estimated: >60 mL/min (ref >60)
Glucose, Bld: 98 mg/dL (ref 70–99)
Potassium: 4 mmol/L (ref 3.5–5.1)
Sodium: 136 mmol/L (ref 135–145)
Total Bilirubin: 1.5 mg/dL — ABNORMAL HIGH (ref 0.3–1.2)
Total Protein: 7.3 g/dL (ref 6.5–8.1)

## 2019-12-01 MED ORDER — OXYCODONE HCL 5 MG PO TABS
15.0000 mg | ORAL_TABLET | ORAL | Status: DC | PRN
  Administered 2019-12-01 – 2019-12-02 (×3): 15 mg via ORAL
  Filled 2019-12-01 (×3): qty 3

## 2019-12-01 MED ORDER — HYDROMORPHONE HCL 1 MG/ML IJ SOLN
1.0000 mg | Freq: Four times a day (QID) | INTRAMUSCULAR | Status: DC | PRN
  Administered 2019-12-01 – 2019-12-02 (×2): 1 mg via INTRAVENOUS
  Filled 2019-12-01 (×3): qty 1

## 2019-12-01 NOTE — Progress Notes (Signed)
Subjective: Dean Webb is a 44 year old male with a medical history significant for sickle cell disease type [place], hypertension, tobacco dependence, history of anemia of chronic disease, and chronic pain syndrome was admitted for acute chest syndrome in the setting of sickle cell pain crisis.  Patient continues to complain of pain primarily to mid chest wall and low back. Pain intensity has improved some overnight. He rates his pain as 6/10. He denies any headache, persistent cough, shortness of breath, urinary symptoms, nausea, vomiting, or diarrhea.  Objective:  Vital signs in last 24 hours:  Vitals:   12/01/19 0000 12/01/19 0518 12/01/19 1023 12/01/19 1452  BP: (!) 139/93 (!) 156/108 (!) 156/106 116/78  Pulse: 68 92 77 88  Resp: 14 16 16 11   Temp: 98.3 F (36.8 C) 98.4 F (36.9 C) 98.1 F (36.7 C) 98.1 F (36.7 C)  TempSrc: Oral Oral Oral Oral  SpO2: 99% 100% 100% 97%  Weight:      Height:        Intake/Output from previous day:   Intake/Output Summary (Last 24 hours) at 12/01/2019 1811 Last data filed at 12/01/2019 1454 Gross per 24 hour  Intake --  Output 1400 ml  Net -1400 ml    Physical Exam: General: Alert, awake, oriented x3, in no acute distress.  HEENT: [place]/AT [place], EOMI Neck: Trachea midline,  no masses, no thyromegal,y no JVD, no carotid bruit OROPHARYNX:  Moist, No exudate/ erythema/lesions.  Heart: Regular rate and rhythm, without murmurs, rubs, gallops, PMI non-displaced, no heaves or thrills on palpation.  Lungs: Clear to auscultation, no wheezing or rhonchi noted. No increased vocal fremitus resonant to percussion  Abdomen: Soft, nontender, nondistended, positive bowel sounds, no masses no hepatosplenomegaly noted..  Neuro: No focal neurological deficits noted cranial nerves II through XII grossly intact. DTRs 2+ bilaterally upper and lower extremities. Strength 5 out of 5 in bilateral upper and lower extremities. Musculoskeletal: No warm swelling or  erythema around joints, no spinal tenderness noted. Psychiatric: Patient alert and oriented x3, good insight and cognition, good recent to remote recall. Lymph node survey: No cervical axillary or inguinal lymphadenopathy noted.  Lab Results:  Basic Metabolic Panel:    Component Value Date/Time   NA 136 12/01/2019 0548   NA 136 09/19/2019 1509   K 4.0 12/01/2019 0548   CL 103 12/01/2019 0548   CO2 23 12/01/2019 0548   BUN 6 12/01/2019 0548   BUN 7 09/19/2019 1509   CREATININE 0.90 12/01/2019 0548   GLUCOSE 98 12/01/2019 0548   CALCIUM 10.0 12/01/2019 0548   CBC:    Component Value Date/Time   WBC 8.2 12/01/2019 0548   HGB 11.8 (L) 12/01/2019 0548   HGB 14.0 09/19/2019 1509   HCT 32.1 (L) 12/01/2019 0548   HCT 40.9 09/19/2019 1509   PLT 310 12/01/2019 0548   PLT 330 09/19/2019 1509   MCV 74.7 (L) 12/01/2019 0548   MCV 86 09/19/2019 1509   NEUTROABS 4.1 12/01/2019 0548   NEUTROABS 4.6 09/19/2019 1509   LYMPHSABS 2.4 12/01/2019 0548   LYMPHSABS 3.5 (H) 09/19/2019 1509   MONOABS 1.3 (H) 12/01/2019 0548   EOSABS 0.4 12/01/2019 0548   EOSABS 0.4 09/19/2019 1509   BASOSABS 0.1 12/01/2019 0548   BASOSABS 0.1 09/19/2019 1509    Recent Results (from the past 240 hour(s))  Respiratory Panel by RT PCR (Flu A&B, Covid) - Nasopharyngeal Swab     Status: None   Collection Time: 11/26/19  8:58 PM  Specimen: Nasopharyngeal Swab  Result Value Ref Range Status   SARS Coronavirus 2 by RT PCR NEGATIVE NEGATIVE Final    Comment: (NOTE) SARS-CoV-2 target nucleic acids are NOT DETECTED.  The SARS-CoV-2 RNA is generally detectable in upper respiratoy specimens during the acute phase of infection. The lowest concentration of SARS-CoV-2 viral copies this assay can detect is 131 copies/mL. A negative result does not preclude SARS-Cov-2 infection and should not be used as the sole basis for treatment or other patient management decisions. A negative result may occur with  improper  specimen collection/handling, submission of specimen other than nasopharyngeal swab, presence of viral mutation(s) within the areas targeted by this assay, and inadequate number of viral copies (<131 copies/mL). A negative result must be combined with clinical observations, patient history, and epidemiological information. The expected result is Negative.  Fact Sheet for Patients:  https://www.moore.com/  Fact Sheet for Healthcare Providers:  https://www.young.biz/  This test is no t yet approved or cleared by the [place] FDA and  has been authorized for detection and/or diagnosis of SARS-CoV-2 by FDA under an Emergency Use Authorization (EUA). This EUA will remain  in effect (meaning this test can be used) for the duration of the COVID-19 declaration under Section 564(b)(1) of the Act, 21 U.S.C. section 360bbb-3(b)(1), unless the authorization is terminated or revoked sooner.     Influenza A by PCR NEGATIVE NEGATIVE Final   Influenza B by PCR NEGATIVE NEGATIVE Final    Comment: (NOTE) The Xpert Xpress SARS-CoV-2/FLU/RSV assay is intended as an aid in  the diagnosis of influenza from Nasopharyngeal swab specimens and  should not be used as a sole basis for treatment. Nasal washings and  aspirates are unacceptable for Xpert Xpress SARS-CoV-2/FLU/RSV  testing.  Fact Sheet for Patients: https://www.moore.com/  Fact Sheet for Healthcare Providers: https://www.young.biz/  This test is not yet approved or cleared by the [place] FDA and  has been authorized for detection and/or diagnosis of SARS-CoV-2 by  FDA under an Emergency Use Authorization (EUA). This EUA will remain  in effect (meaning this test can be used) for the duration of the  Covid-19 declaration under Section 564(b)(1) of the Act, 21  U.S.C. section 360bbb-3(b)(1), unless the authorization is  terminated or revoked. Performed at  [facility], [address], [place], [place] [address]   Culture, blood (routine x 2)     Status: None (Preliminary result)   Collection Time: 11/27/19  1:20 AM   Specimen: BLOOD  Result Value Ref Range Status   Specimen Description   Final    BLOOD BLOOD RIGHT FOREARM Performed at [facility], [address] [place], [place] [address]    Special Requests   Final    BOTTLES DRAWN AEROBIC AND ANAEROBIC Blood Culture adequate volume Performed at [facility], [address], [place], [place] [address]    Culture   Final    NO GROWTH 4 DAYS Performed at [facility], [address], [place], [place] [address]    Report Status PENDING  Incomplete  Culture, blood (routine x 2)     Status: None (Preliminary result)   Collection Time: 11/27/19  6:13 AM   Specimen: BLOOD  Result Value Ref Range Status   Specimen Description   Final    BLOOD RIGHT ANTECUBITAL Performed at [facility], [address], [place], [place] [address]    Special Requests   Final    BOTTLES DRAWN  AEROBIC AND ANAEROBIC Blood Culture adequate volume Performed at [facility], [address], [place], [place] [address]    Culture   Final    NO GROWTH 4 DAYS Performed at [facility], [address], [place], [place] [address]    Report Status PENDING  Incomplete  MRSA PCR Screening     Status: None   Collection Time: 11/27/19  9:10 AM   Specimen: Nasal Mucosa; Nasopharyngeal  Result Value Ref Range Status   MRSA by PCR NEGATIVE NEGATIVE Final    Comment:        The GeneXpert MRSA Assay (FDA approved for NASAL specimens only), is one component of a comprehensive MRSA colonization surveillance program. It is not intended to diagnose MRSA infection nor to guide or monitor treatment for MRSA infections. Performed at [facility], [address], [place], [place] [address]      Studies/Results: No results found.  Medications: Scheduled Meds: . (feeding supplement) PROSource Plus  30 mL Oral BID BM  . amLODipine  10 mg Oral Daily  . Chlorhexidine Gluconate Cloth  6 each Topical Daily  . enoxaparin (LOVENOX) injection  40 mg Subcutaneous Q24H  . famotidine  20 mg Oral Q12H  . feeding supplement  1 Container Oral TID BM  . folic acid  1 mg Oral Daily  . multivitamin with minerals  1 tablet Oral Daily  . nicotine  21 mg Transdermal Daily  . senna-docusate  1 tablet Oral BID   Continuous Infusions: . sodium chloride 10 mL/hr at 12/01/19 0843  . diphenhydrAMINE    . famotidine (PEPCID) IV Stopped (11/27/19 0148)   PRN Meds:.acetaminophen, albuterol, diphenhydrAMINE **OR** diphenhydrAMINE, HYDROmorphone (DILAUDID) injection, ondansetron, oxyCODONE, polyethylene glycol, promethazine **OR** promethazine  Consultants:  None  Procedures:  None  Antibiotics:  None  Assessment/Plan: Principal Problem:   Acute chest syndrome due to sickle-cell disease (HCC) Active Problems:   Essential hypertension   OSA (obstructive sleep apnea)   Acute chest syndrome (HCC)   Sickle cell pain crisis (HCC)   Sickle cell crisis acute chest syndrome (HCC)  CAP vs acute chest syndrome: Patient has remained afebrile. Oxygen saturation is 100% on RA. Patient is yet to mobilize, encouraged to ambulate in halls. Continue supplemental oxygen as needed. Discontinued azithromycin. Will consider transitioning to oral antibiotics to complete treatment on tomorrow.  Sickle cell disease with pain crisis: Continue IV fluids to KVO. Also, weaning IV Dilaudid, 1 mg every 4 hours as needed Increase oxycodone to 15 mg every 4 hours as needed for severe breakthrough pain Monitor vital signs closely, reevaluate pain scale regularly, supplemental oxygen as needed.  Essential hypertension: Stable. Continue home medications.  Sickle cell anemia: Hemoglobin remained stable and  consistent with patient's baseline. There is no clinical indication for blood transfusion on today. Monitor closely. CBC in a.m.  Chronic pain syndrome: Continue home medications Code Status: Full Code Family Communication: N/A Disposition Plan: Not yet ready for discharge  Emie Sommerfeld Rennis Petty  APRN, MSN, FNP-C [facility] [address] [place], [place] [address] [phone]  If 5PM-8AM, please contact night-coverage.  12/01/2019, 6:11 PM  LOS: 4 days

## 2019-12-02 ENCOUNTER — Encounter: Payer: Self-pay | Admitting: Family Medicine

## 2019-12-02 DIAGNOSIS — F17200 Nicotine dependence, unspecified, uncomplicated: Secondary | ICD-10-CM

## 2019-12-02 DIAGNOSIS — Z79891 Long term (current) use of opiate analgesic: Secondary | ICD-10-CM

## 2019-12-02 DIAGNOSIS — D5720 Sickle-cell/Hb-C disease without crisis: Secondary | ICD-10-CM

## 2019-12-02 LAB — CULTURE, BLOOD (ROUTINE X 2)
Culture: NO GROWTH
Culture: NO GROWTH
Special Requests: ADEQUATE
Special Requests: ADEQUATE

## 2019-12-02 MED ORDER — IBUPROFEN 800 MG PO TABS: 800.0000 mg | ORAL_TABLET | Freq: Three times a day (TID) | ORAL | 0 refills | Status: DC | PRN

## 2019-12-02 MED ORDER — NICOTINE 21 MG/24HR TD PT24: 21.0000 mg | MEDICATED_PATCH | Freq: Every day | TRANSDERMAL | 0 refills | Status: DC

## 2019-12-02 MED ORDER — OXYCODONE HCL 10 MG PO TABS
10.0000 mg | ORAL_TABLET | Freq: Four times a day (QID) | ORAL | 0 refills | Status: AC | PRN
Start: 2019-12-04 — End: 2019-12-19

## 2019-12-02 NOTE — Discharge Instructions (Signed)
Sickle Cell Anemia, Adult  Sickle cell anemia is a condition where your red blood cells are shaped like sickles. Red blood cells carry oxygen through the body. Sickle-shaped cells do not live as long as normal red blood cells. They also clump together and block blood from flowing through the blood vessels. This prevents the body from getting enough oxygen. Sickle cell anemia causes organ damage and pain. It also increases the risk of infection. Follow these instructions at home: Medicines  Take over-the-counter and prescription medicines only as told by your doctor.  If you were prescribed an antibiotic medicine, take it as told by your doctor. Do not stop taking the antibiotic even if you start to feel better.  If you develop a fever, do not take medicines to lower the fever right away. Tell your doctor about the fever. Managing pain, stiffness, and swelling  Try these methods to help with pain: ? Use a heating pad. ? Take a warm bath. ? Distract yourself, such as by watching TV. Eating and drinking  Drink enough fluid to keep your pee (urine) clear or pale yellow. Drink more in hot weather and during exercise.  Limit or avoid alcohol.  Eat a healthy diet. Eat plenty of fruits, vegetables, whole grains, and lean protein.  Take vitamins and supplements as told by your doctor. Traveling  When traveling, keep these with you: ? Your medical information. ? The names of your doctors. ? Your medicines.  If you need to take an airplane, talk to your doctor first. Activity  Rest often.  Avoid exercises that make your heart beat much faster, such as jogging. General instructions  Do not use products that have nicotine or tobacco, such as cigarettes and e-cigarettes. If you need help quitting, ask your doctor.  Consider wearing a medical alert bracelet.  Avoid being in high places (high altitudes), such as mountains.  Avoid very hot or cold temperatures.  Avoid places where the  temperature changes a lot.  Keep all follow-up visits as told by your doctor. This is important. Contact a doctor if:  A joint hurts.  Your feet or hands hurt or swell.  You feel tired (fatigued). Get help right away if:  You have symptoms of infection. These include: ? Fever. ? Chills. ? Being very tired. ? Irritability. ? Poor eating. ? Throwing up (vomiting).  You feel dizzy or faint.  You have new stomach pain, especially on the left side.  You have a an erection (priapism) that lasts more than 4 hours.  You have numbness in your arms or legs.  You have a hard time moving your arms or legs.  You have trouble talking.  You have pain that does not go away when you take medicine.  You are short of breath.  You are breathing fast.  You have a long-term cough.  You have pain in your chest.  You have a bad headache.  You have a stiff neck.  Your stomach looks bloated even though you did not eat much.  Your skin is pale.  You suddenly cannot see well. Summary  Sickle cell anemia is a condition where your red blood cells are shaped like sickles.  Follow your doctor's advice on ways to manage pain, food to eat, activities to do, and steps to take for safe travel.  Get medical help right away if you have any signs of infection, such as a fever. This information is not intended to replace advice given to you by   your health care provider. Make sure you discuss any questions you have with your health care provider. Document Revised: 05/24/2018 Document Reviewed: 03/07/2016 Elsevier Patient Education  2020 [employer]  

## 2019-12-02 NOTE — Discharge Summary (Signed)
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   Specialty: Adult Health Nurse Practitioner Contact information: [address] [place] [address] [phone]              Pt is discharged home in good condition and is to follow up with Barbette Merino, NP this week to have labs evaluated. Nicklas Mcsweeney Hefner is instructed to increase activity slowly and balance with rest for the next few days, and use prescribed medication to complete treatment of pain  Diet: Regular Wt Readings from Last 3 Encounters:  11/26/19 90.7 kg  11/19/19 89.8 kg  09/19/19 93 kg    History of present illness:  Dean Webb is a 44 year old male with a medical history significant for sickle cell disease hypertension, asthma, and OSA who presents to the emergency department for evaluation of fevers and dyspnea.  Patient reports new onset of right lower chest wall pain and dyspnea beginning the evening of 11/25/2019.  He has  had associated fevers, chills, and occasional dry cough.  He denies any left-sided chest pain, nausea, vomiting, abdominal pain, dysuria, diarrhea, or swelling in his legs.  He says this feels similar to his previous sickle cell pain episodes.  ER course: Initial vitals showed BP 150/113, pulse 112, RR 19, temperature 101.2 F with a max temperature of 101.8 F, oxygen saturation 95% on room air.  Labs show WBCs 11.1, hemoglobin 13.4, platelets 231,  reticulocytes 4.0%, sodium 131, potassium 3.9, bicarb 20, BUN 8, creatinine 1.2, serum glucose 107, and high-sensitivity troponin negative.  COVID-19 negative.  Influenza AMB are negative.  Chest x-ray shows focal consolidation at the right costophrenic angle concerning for acute chest syndrome versus acute infection. Patient was given IV Dilaudid 2 mg x 1, IV Benadryl, IV ceftriaxone, and azithromycin, and oral Tylenol.  The hospitalist service was consulted to admit for further evaluation and management.  Hospital Course:   Acute chest syndrome: On admission, patient reported right lower chest wall discomfort.  Chest x-ray showed focal consolidation seen at the right costophrenic angle and patient was febrile.  Treated with IV antibiotics throughout admission. Patient continues to be afebrile without any further signs of infection or inflammation.  No further antibiotics warranted.  Patient will follow-up with PCP within 1 week and recommend repeating chest x-ray in 4.   Sickle cell disease with pain crisis: Patient was admitted for sickle cell pain crisis and managed appropriately with IVF, IV Dilaudid via clinician assisted doses, IV Toradol, as well as other adjunct therapies per sickle cell pain management protocols.  Patient was transitioned to home medication oxycodone 10 mg every 4 hours as needed.  Advised to follow-up with PCP for medication management.  Patient reports being out of prescription for oxycodone oxycodone 10 mg #60 sent to patient's pharmacy.  Substance reporting system reviewed prior to prescribing opiate medication no inconsistencies noted.  Pain intensity is 5/10 and patient feels that he can manage pain at home  on current medications.  Hypertension: Hypertension well controlled on 10 mg daily.  Also, patient advised to follow a heart healthy diet.  Follow-up with PCP  Patient was therefore discharged home today in a hemodynamically stable condition.   Dorinda HillDonald will  follow-up with PCP within 1 week of this discharge. Dorinda HillDonald was counseled extensively about nonpharmacologic means of pain management, patient verbalized understanding and was appreciative of  the care received during this admission.   We discussed the need for good hydration, monitoring of hydration status, avoidance of heat, cold, stress, and infection triggers.  Patient will continue folic acid 1 mg daily.  He is not on any disease modifying agents at this time due to infrequent sickle cell crisis.  Rate patient was reminded of the need to seek medical attention immediately if any symptom of bleeding, anemia, or infection occurs.  Discharge Exam: Vitals:   12/01/19 2103 12/02/19 0435  BP: (!) 155/91 (!) 147/88  Pulse: 77 80  Resp: 14 14  Temp: 98 F (36.7 C) 98.2 F (36.8 C)  SpO2: 99% 98%   Vitals:   12/01/19 1452 12/01/19 1834 12/01/19 2103 12/02/19 0435  BP: 116/78 (!) 158/102 (!) 155/91 (!) 147/88  Pulse: 88 80 77 80  Resp: 11 12 14 14   Temp: 98.1 F (36.7 C) 97.7 F (36.5 C) 98 F (36.7 C) 98.2 F (36.8 C)  TempSrc: Oral Oral Oral Oral  SpO2: 97% 100% 99% 98%  Weight:      Height:        General appearance : Awake, alert, not in any distress. Speech Clear. Not toxic looking HEENT: Atraumatic and Normocephalic, pupils equally reactive to light and accomodation Neck: Supple, no JVD. No cervical lymphadenopathy.  Chest: Good air entry bilaterally, no added sounds  CVS: S1 S2 regular, no murmurs.  Abdomen: Bowel sounds present, Non tender and not distended with no gaurding, rigidity or rebound. Extremities: B/L Lower Ext shows no edema, both legs are warm to touch Neurology: Awake alert, and oriented X 3, CN II-XII intact, Non focal Skin: No Rash  Discharge Instructions  Discharge Instructions    Discharge patient   Complete by: As directed    Discharge disposition: 01-Home or Self Care   Discharge patient date: 12/02/2019     Allergies as of 12/02/2019   No  Known Allergies     Medication List    TAKE these medications   albuterol 108 (90 Base) MCG/ACT inhaler Commonly known as: VENTOLIN HFA Inhale 2 puffs into the lungs every 6 (six) hours as needed for wheezing or shortness of breath.   amLODipine 10 MG tablet Commonly known as: NORVASC Take 1 tablet (10 mg total) by mouth daily.   cetirizine 10 MG tablet Commonly known as: ZYRTEC Take 1 tablet (10 mg total) by mouth daily as needed for allergies.   folic acid 1 MG tablet Commonly known as: FOLVITE Take 1 tablet (1 mg total) by mouth daily.   ibuprofen 800 MG tablet Commonly known as: ADVIL Take 1 tablet (800 mg total) by mouth every 8 (eight) hours as needed.   nicotine 21 mg/24hr patch Commonly known as: NICODERM CQ - dosed in mg/24 hours Place 1 patch (21 mg total) onto the skin daily.   omeprazole 20 MG capsule Commonly known as: PRILOSEC Take 1 capsule (20 mg total) by mouth 2 (two) times daily before a meal. What changed:   when to take this  reasons to take this   Oxycodone HCl 10 MG  Tabs Take 1 tablet (10 mg total) by mouth every 6 (six) hours as needed for up to 15 days. What changed: reasons to take this       The results of significant diagnostics from this hospitalization (including imaging, microbiology, ancillary and laboratory) are listed below for reference.    Significant Diagnostic Studies: DG Chest 2 View  Result Date: 11/26/2019 CLINICAL DATA:  Dyspnea, sickle cell disease, right chest pain EXAM: CHEST - 2 VIEW COMPARISON:  08/08/2017 FINDINGS: The lungs are symmetrically well expanded. There is focal consolidation within the right lateral lung base, acute chest syndrome versus is infection. Minimal residual scarring at the left costophrenic angle. The lungs are otherwise clear. No pneumothorax or pleural effusion. Cardiac size is within normal limits. No acute bone abnormality. IMPRESSION: Focal consolidation at the right costophrenic angle,  acute chest syndrome versus acute infection. Electronically Signed   By: Helyn Numbers MD   On: 11/26/2019 20:59    Microbiology: Recent Results (from the past 240 hour(s))  Respiratory Panel by RT PCR (Flu A&B, Covid) - Nasopharyngeal Swab     Status: None   Collection Time: 11/26/19  8:58 PM   Specimen: Nasopharyngeal Swab  Result Value Ref Range Status   SARS Coronavirus 2 by RT PCR NEGATIVE NEGATIVE Final    Comment: (NOTE) SARS-CoV-2 target nucleic acids are NOT DETECTED.  The SARS-CoV-2 RNA is generally detectable in upper respiratoy specimens during the acute phase of infection. The lowest concentration of SARS-CoV-2 viral copies this assay can detect is 131 copies/mL. A negative result does not preclude SARS-Cov-2 infection and should not be used as the sole basis for treatment or other patient management decisions. A negative result may occur with  improper specimen collection/handling, submission of specimen other than nasopharyngeal swab, presence of viral mutation(s) within the areas targeted by this assay, and inadequate number of viral copies (<131 copies/mL). A negative result must be combined with clinical observations, patient history, and epidemiological information. The expected result is Negative.  Fact Sheet for Patients:  https://www.moore.com/  Fact Sheet for Healthcare Providers:  https://www.young.biz/  This test is no t yet approved or cleared by the [place] FDA and  has been authorized for detection and/or diagnosis of SARS-CoV-2 by FDA under an Emergency Use Authorization (EUA). This EUA will remain  in effect (meaning this test can be used) for the duration of the COVID-19 declaration under Section 564(b)(1) of the Act, 21 U.S.C. section 360bbb-3(b)(1), unless the authorization is terminated or revoked sooner.     Influenza A by PCR NEGATIVE NEGATIVE Final   Influenza B by PCR NEGATIVE NEGATIVE Final     Comment: (NOTE) The Xpert Xpress SARS-CoV-2/FLU/RSV assay is intended as an aid in  the diagnosis of influenza from Nasopharyngeal swab specimens and  should not be used as a sole basis for treatment. Nasal washings and  aspirates are unacceptable for Xpert Xpress SARS-CoV-2/FLU/RSV  testing.  Fact Sheet for Patients: https://www.moore.com/  Fact Sheet for Healthcare Providers: https://www.young.biz/  This test is not yet approved or cleared by the [place] FDA and  has been authorized for detection and/or diagnosis of SARS-CoV-2 by  FDA under an Emergency Use Authorization (EUA). This EUA will remain  in effect (meaning this test can be used) for the duration of the  Covid-19 declaration under Section 564(b)(1) of the Act, 21  U.S.C. section 360bbb-3(b)(1), unless the authorization is  terminated or revoked. Performed at [facility], [address] [place],  [place] [address]   Culture, blood (routine x 2)     Status: None   Collection Time: 11/27/19  1:20 AM   Specimen: BLOOD  Result Value Ref Range Status   Specimen Description   Final    BLOOD BLOOD RIGHT FOREARM Performed at [facility], [address], [place], [place] [address]    Special Requests   Final    BOTTLES DRAWN AEROBIC AND ANAEROBIC Blood Culture adequate volume Performed at [facility], [address] [place], [place] [address]    Culture   Final    NO GROWTH 5 DAYS Performed at [facility], [address], [place], [place] [address]    Report Status 12/02/2019 FINAL  Final  Culture, blood (routine x 2)     Status: None   Collection Time: 11/27/19  6:13 AM   Specimen: BLOOD  Result Value Ref Range Status   Specimen Description   Final    BLOOD RIGHT ANTECUBITAL Performed at [facility], [address] [place], [place] [address]    Special Requests   Final    BOTTLES  DRAWN AEROBIC AND ANAEROBIC Blood Culture adequate volume Performed at [facility], [address], [place], [place] [address]    Culture   Final    NO GROWTH 5 DAYS Performed at [facility], [address], [place], [place] [address]    Report Status 12/02/2019 FINAL  Final  MRSA PCR Screening     Status: None   Collection Time: 11/27/19  9:10 AM   Specimen: Nasal Mucosa; Nasopharyngeal  Result Value Ref Range Status   MRSA by PCR NEGATIVE NEGATIVE Final    Comment:        The GeneXpert MRSA Assay (FDA approved for NASAL specimens only), is one component of a comprehensive MRSA colonization surveillance program. It is not intended to diagnose MRSA infection nor to guide or monitor treatment for MRSA infections. Performed at [facility], [address], [place], [place] [address]      Labs: Basic Metabolic Panel: Recent Labs  Lab 11/28/19 0602 12/01/19 0548  NA 133* 136  K 4.3 4.0  CL 102 103  CO2 22 23  GLUCOSE 94 98  BUN 11 6  CREATININE 1.23 0.90  CALCIUM 9.4 10.0   Liver Function Tests: Recent Labs  Lab 12/01/19 0548  AST 23  ALT 22  ALKPHOS 76  BILITOT 1.5*  PROT 7.3  ALBUMIN 3.6   No results for input(s): LIPASE, AMYLASE in the last 168 hours. No results for input(s): AMMONIA in the last 168 hours. CBC: Recent Labs  Lab 11/28/19 0602 12/01/19 0548  WBC 12.3* 8.2  NEUTROABS  --  4.1  HGB 11.4* 11.8*  HCT 30.7* 32.1*  MCV 75.8* 74.7*  PLT 210 310   Cardiac Enzymes: No results for input(s): CKTOTAL, CKMB, CKMBINDEX, TROPONINI in the last 168 hours. BNP: Invalid input(s): POCBNP CBG: No results for input(s): GLUCAP in the last 168 hours.  Time coordinating discharge: 35 minutes  Signed:  Nolon Nations  APRN, MSN, FNP-C Patient Care [facility] [address] [place], [place] [address] [phone] [facility] 12/04/2019, 5:06 PM

## 2019-12-04 DIAGNOSIS — F17200 Nicotine dependence, unspecified, uncomplicated: Secondary | ICD-10-CM

## 2020-02-19 ENCOUNTER — Encounter: Payer: Self-pay | Admitting: Nurse Practitioner

## 2020-02-19 ENCOUNTER — Other Ambulatory Visit: Payer: Self-pay

## 2020-02-19 ENCOUNTER — Ambulatory Visit (INDEPENDENT_AMBULATORY_CARE_PROVIDER_SITE_OTHER): Payer: Medicaid Other | Admitting: Nurse Practitioner

## 2020-02-19 VITALS — BP 147/90 | HR 94 | Temp 97.7°F | Ht 67.0 in | Wt 197.6 lb

## 2020-02-19 DIAGNOSIS — M5441 Lumbago with sciatica, right side: Secondary | ICD-10-CM

## 2020-02-19 DIAGNOSIS — I10 Essential (primary) hypertension: Secondary | ICD-10-CM | POA: Diagnosis not present

## 2020-02-19 DIAGNOSIS — G8929 Other chronic pain: Secondary | ICD-10-CM | POA: Diagnosis not present

## 2020-02-19 DIAGNOSIS — D571 Sickle-cell disease without crisis: Secondary | ICD-10-CM | POA: Diagnosis not present

## 2020-02-19 MED ORDER — CLONIDINE HCL 0.1 MG PO TABS
0.2000 mg | ORAL_TABLET | Freq: Once | ORAL | Status: AC
  Administered 2020-02-19: 0.2 mg via ORAL

## 2020-02-19 MED ORDER — OXYCODONE HCL 15 MG PO TABS: 15.0000 mg | ORAL_TABLET | Freq: Four times a day (QID) | ORAL | 0 refills | Status: AC | PRN

## 2020-02-19 MED ORDER — LOSARTAN POTASSIUM 50 MG PO TABS: 50.0000 mg | ORAL_TABLET | Freq: Every day | ORAL | 3 refills | Status: DC

## 2020-02-19 NOTE — Progress Notes (Signed)
[facility] Patient [facility] [address] [place], [place]  [address] Phone:  [phone]   Fax:  [phone]   Established Patient Office Visit  Subjective:  Patient ID: Dean Webb, male    DOB: 08/29/1975  Age: 45 y.o. MRN: [id number]  CC:  Chief Complaint  Patient presents with   Follow-up    Follow up , 2 month follow and refill on medication , back pain , leg when stand from long periods of time. Discuss change dose of pain medication     HPI Dean Webb presents for follow up. He  has a past medical history of Asthma, GERD (gastroesophageal reflux disease), Hb-S/Hb-C disease (HCC) (1979), HTN (hypertension) (2008), Hypertension, Sickle cell anemia (HCC), and Sickle cell disease (HCC).   He is having 7.5/10 in back left hip and left leg including the foot. He does have some numbness.  Denies fever, headache, cough, wheezing, shortness of breath, chest pains, abdominal pain. Denies any open wounds, skin irritation. He admits that he was instructed to talk about his pain medication. He was on Morphine in the past. He does not want restart this.   He has hypertension. He is not exercising or adhering to a low salt diet. He admits that he loves bacon and sausage. He has had some previous episodes of headache and dizziness. Denies any shortness of breath or recent chest pain. He is compliant with his medication.   He has discoloration of right foot great toenail. He denies any injury. He has trimmed the nailed off and it grew back with the same discoloration and an odor. He is concern and would like it evaluated. He denies any change in temperature . He has not tried any other treatment.  Past Medical History:  Diagnosis Date   Asthma    GERD (gastroesophageal reflux disease)    Hb-S/Hb-C disease (HCC) 1979   HTN (hypertension) 2008   Hypertension    Sickle cell anemia (HCC)    Sickle cell disease (HCC)     Past Surgical History:  Procedure Laterality  Date   CHOLECYSTECTOMY      Family History  Problem Relation Age of Onset   Sickle cell trait Mother    Hypertension Mother    Asthma Mother    Sickle cell trait Father    Congestive Heart Failure Father    Hypertension Sister    Sickle cell anemia Daughter    Hypertension Daughter    Diabetes Daughter     Social History   Socioeconomic History   Marital status: Single    Spouse name: Not on file   Number of children: Not on file   Years of education: Not on file   Highest education level: Not on file  Occupational History   Not on file  Tobacco Use   Smoking status: Current Every Day Smoker    Packs/day: 0.50    Years: 17.00    Pack years: 8.50   Smokeless tobacco: Never Used  Building services engineer Use: Never used  Substance and Sexual Activity   Alcohol use: Yes    Comment: Occasional drinker   Drug use: Not Currently    Types: Marijuana   Sexual activity: Yes  Other Topics Concern   Not on file  Social History Narrative   Not on file   Social Determinants of Health   Financial Resource Strain: Not on file  Food Insecurity: Not on file  Transportation Needs: Not on file  Physical Activity: Not on file  Stress: Not on file  Social Connections: Not on file  Intimate Partner Violence: Not on file    Outpatient Medications Prior to Visit  Medication Sig Dispense Refill   albuterol (VENTOLIN HFA) 108 (90 Base) MCG/ACT inhaler Inhale 2 puffs into the lungs every 6 (six) hours as needed for wheezing or shortness of breath. 8 g 1   amLODipine (NORVASC) 10 MG tablet Take 1 tablet (10 mg total) by mouth daily. 30 tablet 11   cetirizine (ZYRTEC) 10 MG tablet Take 1 tablet (10 mg total) by mouth daily as needed for allergies. 30 tablet 1   folic acid (FOLVITE) 1 MG tablet Take 1 tablet (1 mg total) by mouth daily. 90 tablet 1   ibuprofen (ADVIL) 800 MG tablet Take 1 tablet (800 mg total) by mouth every 8 (eight) hours as needed. 30 tablet  0   nicotine (NICODERM CQ - DOSED IN MG/24 HOURS) 21 mg/24hr patch Place 1 patch (21 mg total) onto the skin daily. 28 patch 0   omeprazole (PRILOSEC) 20 MG capsule Take 1 capsule (20 mg total) by mouth 2 (two) times daily before a meal. (Patient taking differently: Take 20 mg by mouth 2 (two) times daily as needed (indigestion).) 28 capsule 3   Oxycodone HCl 10 MG TABS Take by mouth. 1 tablet as needed for pain     No facility-administered medications prior to visit.    No Known Allergies  ROS Review of Systems  Constitutional: Negative for chills and fever.  Respiratory: Negative for shortness of breath.   Cardiovascular: Negative for chest pain.  Gastrointestinal: Negative for constipation, diarrhea, nausea and vomiting.  Neurological: Positive for dizziness, numbness and headaches.       Tingling in the foot       Objective:    Physical Exam Constitutional:      Appearance: He is obese.  HENT:     Head: Normocephalic and atraumatic.     Nose: Nose normal.     Mouth/Throat:     Mouth: Mucous membranes are moist.  Cardiovascular:     Rate and Rhythm: Normal rate and regular rhythm.     Pulses: Normal pulses.     Heart sounds: Normal heart sounds.  Pulmonary:     Effort: Pulmonary effort is normal.     Breath sounds: Normal breath sounds.  Musculoskeletal:        General: Normal range of motion.     Cervical back: Normal range of motion.  Feet:     Right foot:     Skin integrity: Skin integrity normal.     Left foot:     Skin integrity: Skin integrity normal.     Comments: Dark nail bed to right great toe and darkness noted on BL 5th digits Skin:    General: Skin is warm and dry.     Capillary Refill: Capillary refill takes less than 2 seconds.  Neurological:     General: No focal deficit present.     Mental Status: He is alert and oriented to person, place, and time.  Psychiatric:        Mood and Affect: Mood normal.        Behavior: Behavior normal.         Thought Content: Thought content normal.        Judgment: Judgment normal.     BP (!) 147/90    Pulse 94    Temp 97.7 F (36.5 C) (  Temporal)    Ht 5\' 7"  (1.702 m)    Wt 197 lb 9.6 oz (89.6 kg)    SpO2 98%    BMI 30.95 kg/m  Wt Readings from Last 3 Encounters:  02/19/20 197 lb 9.6 oz (89.6 kg)  11/26/19 200 lb (90.7 kg)  11/19/19 198 lb (89.8 kg)     There are no preventive care reminders to display for this patient.  There are no preventive care reminders to display for this patient.  No results found for: TSH Lab Results  Component Value Date   WBC 6.4 02/19/2020   HGB 15.0 02/19/2020   HCT 43.5 02/19/2020   MCV 83 02/19/2020   PLT 300 02/19/2020   Lab Results  Component Value Date   NA 139 02/19/2020   K 4.8 02/19/2020   CO2 22 02/19/2020   GLUCOSE 80 02/19/2020   BUN 6 02/19/2020   CREATININE 1.10 02/19/2020   BILITOT 1.6 (H) 02/19/2020   ALKPHOS 97 02/19/2020   AST 31 02/19/2020   ALT 17 02/19/2020   PROT 7.8 02/19/2020   ALBUMIN 5.0 02/19/2020   CALCIUM 10.8 (H) 02/19/2020   ANIONGAP 10 12/01/2019   Lab Results  Component Value Date   CHOL  11/06/2008    84        ATP III CLASSIFICATION:  <200     mg/dL   Desirable  11/08/2008  mg/dL   Borderline High  409-811    mg/dL   High          Lab Results  Component Value Date   HDL 31 (L) 11/06/2008   Lab Results  Component Value Date   [facility]  11/06/2008    44        Total Cholesterol/HDL:CHD Risk Coronary Heart Disease Risk Table                     Men   Women  1/2 Average Risk   3.4   3.3  Average Risk       5.0   4.4  2 X Average Risk   9.6   7.1  3 X Average Risk  23.4   11.0        Use the calculated Patient Ratio above and the CHD Risk Table to determine the patient's CHD Risk.        ATP III CLASSIFICATION (LDL):  <100     mg/dL   Optimal  11/08/2008  mg/dL   Near or Above                    Optimal  130-159  mg/dL   Borderline  782-956  mg/dL   High  213-086     mg/dL   Very High   Lab  Results  Component Value Date   TRIG 44 11/06/2008   Lab Results  Component Value Date   CHOLHDL 2.7 11/06/2008   No results found for: HGBA1C    Assessment & Plan:   Problem List Items Addressed This Visit      Cardiovascular and Mediastinum   Essential hypertension (Chronic) Persistent Started Losartan 50 mg  Encouraged on going compliance with current medication regimen Encouraged home monitoring and recording BP <130/80 Eating a heart-healthy diet with less salt Encouraged regular physical activity  Recommend Weight loss    Relevant Medications   cloNIDine (CATAPRES) tablet 0.2 mg   losartan (COZAAR) 50 MG tablet    Other Visit Diagnoses  Hb-SS disease without crisis (HCC)    -  Primary Ensure adequate hydration. Move frequently to reduce venous thromboembolism risk. Avoid situations that could lead to dehydration or could exacerbate pain Discussed S&S of infection, seizures, stroke acute chest, DVT and how important it is to seek medical attention Take medication as directed along with pain contract and overall compliance Discussed the risk related to opiate use (addition, tolerance and dependency) Discussed no further increase in opioids but will consider alternative treatment options would like him to follow up with xray.     Relevant Orders   [id number] 11+Oxyco+Alc+Crt-Bund   POCT URINALYSIS DIP (CLINITEK)   Sickle Cell Panel (Completed)   DG Lumbar Spine Complete   Chronic bilateral low back pain with right-sided sciatica       Relevant Medications   oxyCODONE (ROXICODONE) 15 MG immediate release tablet   Other Relevant Orders   DG Lumbar Spine Complete      Meds ordered this encounter  Medications   oxyCODONE (ROXICODONE) 15 MG immediate release tablet    Sig: Take 1 tablet (15 mg total) by mouth every 6 (six) hours as needed for up to 15 days for pain. Must last 30 days.    Dispense:  60 tablet    Refill:  0    .    Order Specific Question:    Supervising Provider    Answer:   Quentin Angst [[id number]]   cloNIDine (CATAPRES) tablet 0.2 mg   losartan (COZAAR) 50 MG tablet    Sig: Take 1 tablet (50 mg total) by mouth daily.    Dispense:  90 tablet    Refill:  3    Order Specific Question:   Supervising Provider    Answer:   Quentin Angst [id number]    Follow-up: Return in about 4 weeks (around 03/18/2020).    Barbette Merino, NP

## 2020-02-19 NOTE — Patient Instructions (Signed)

## 2020-02-20 LAB — CMP14+CBC/D/PLT+FER+RETIC+V...
ALT: 17 IU/L (ref 0–44)
AST: 31 IU/L (ref 0–40)
Albumin/Globulin Ratio: 1.8 (ref 1.2–2.2)
Albumin: 5 g/dL (ref 4.0–5.0)
Alkaline Phosphatase: 97 IU/L (ref 44–121)
BUN/Creatinine Ratio: 5 — ABNORMAL LOW (ref 9–20)
BUN: 6 mg/dL (ref 6–24)
Basophils Absolute: 0.1 10*3/uL (ref 0.0–0.2)
Basos: 1 %
Bilirubin Total: 1.6 mg/dL — ABNORMAL HIGH (ref 0.0–1.2)
CO2: 22 mmol/L (ref 20–29)
Calcium: 10.8 mg/dL — ABNORMAL HIGH (ref 8.7–10.2)
Chloride: 102 mmol/L (ref 96–106)
Creatinine, Ser: 1.1 mg/dL (ref 0.76–1.27)
EOS (ABSOLUTE): 0.2 10*3/uL (ref 0.0–0.4)
Eos: 3 %
Ferritin: 163 ng/mL (ref 30–400)
GFR calc Af Amer: 95 mL/min/{1.73_m2} (ref >59)
GFR calc non Af Amer: 82 mL/min/{1.73_m2} (ref >59)
Globulin, Total: 2.8 g/dL (ref 1.5–4.5)
Glucose: 80 mg/dL (ref 65–99)
Hematocrit: 43.5 % (ref 37.5–51.0)
Hemoglobin: 15 g/dL (ref 13.0–17.7)
Immature Grans (Abs): 0 10*3/uL (ref 0.0–0.1)
Immature Granulocytes: 0 %
Lymphocytes Absolute: 3.4 10*3/uL — ABNORMAL HIGH (ref 0.7–3.1)
Lymphs: 54 %
MCH: 28.7 pg (ref 26.6–33.0)
MCHC: 34.5 g/dL (ref 31.5–35.7)
MCV: 83 fL (ref 79–97)
Monocytes Absolute: 0.9 10*3/uL (ref 0.1–0.9)
Monocytes: 14 %
NRBC: 4 % — ABNORMAL HIGH (ref 0–0)
Neutrophils Absolute: 1.8 10*3/uL (ref 1.4–7.0)
Neutrophils: 28 %
Platelets: 300 10*3/uL (ref 150–450)
Potassium: 4.8 mmol/L (ref 3.5–5.2)
RBC: 5.22 x10E6/uL (ref 4.14–5.80)
RDW: 18.4 % — ABNORMAL HIGH (ref 11.6–15.4)
Retic Ct Pct: 2.4 % (ref 0.6–2.6)
Sodium: 139 mmol/L (ref 134–144)
Total Protein: 7.8 g/dL (ref 6.0–8.5)
Vit D, 25-Hydroxy: 12.8 ng/mL — ABNORMAL LOW (ref 30.0–100.0)
WBC: 6.4 10*3/uL (ref 3.4–10.8)

## 2020-02-21 DIAGNOSIS — G894 Chronic pain syndrome: Secondary | ICD-10-CM | POA: Insufficient documentation

## 2020-02-21 DIAGNOSIS — Z720 Tobacco use: Secondary | ICD-10-CM | POA: Insufficient documentation

## 2020-02-23 ENCOUNTER — Other Ambulatory Visit: Payer: Self-pay | Admitting: Nurse Practitioner

## 2020-02-23 ENCOUNTER — Encounter: Payer: Self-pay | Admitting: Nurse Practitioner

## 2020-02-23 DIAGNOSIS — E559 Vitamin D deficiency, unspecified: Secondary | ICD-10-CM | POA: Insufficient documentation

## 2020-02-23 LAB — DRUG SCREEN 764883 11+OXYCO+ALC+CRT-BUND
Amphetamines, Urine: NEGATIVE ng/mL
BENZODIAZ UR QL: NEGATIVE ng/mL
Barbiturate: NEGATIVE ng/mL
Cannabinoid Quant, Ur: NEGATIVE ng/mL
Cocaine (Metabolite): NEGATIVE ng/mL
Creatinine: 36.1 mg/dL (ref 20.0–300.0)
Ethanol: NEGATIVE %
Meperidine: NEGATIVE ng/mL
Methadone Screen, Urine: NEGATIVE ng/mL
OPIATE SCREEN URINE: NEGATIVE ng/mL
Oxycodone/Oxymorphone, Urine: NEGATIVE ng/mL
Phencyclidine: NEGATIVE ng/mL
Propoxyphene: NEGATIVE ng/mL
Tramadol: NEGATIVE ng/mL
pH, Urine: 8.8 (ref 4.5–8.9)

## 2020-02-23 MED ORDER — ERGOCALCIFEROL 1.25 MG (50000 UT) PO CAPS: 50000.0000 [IU] | ORAL_CAPSULE | ORAL | 3 refills | Status: AC

## 2020-03-22 ENCOUNTER — Other Ambulatory Visit: Payer: Self-pay

## 2020-03-22 ENCOUNTER — Encounter: Payer: Self-pay | Admitting: Nurse Practitioner

## 2020-03-22 ENCOUNTER — Ambulatory Visit (INDEPENDENT_AMBULATORY_CARE_PROVIDER_SITE_OTHER): Payer: Medicaid Other | Admitting: Nurse Practitioner

## 2020-03-22 VITALS — BP 137/75 | HR 95 | Temp 97.7°F | Ht 67.0 in | Wt 202.2 lb

## 2020-03-22 DIAGNOSIS — R599 Enlarged lymph nodes, unspecified: Secondary | ICD-10-CM

## 2020-03-22 DIAGNOSIS — I159 Secondary hypertension, unspecified: Secondary | ICD-10-CM

## 2020-03-22 DIAGNOSIS — Z79891 Long term (current) use of opiate analgesic: Secondary | ICD-10-CM

## 2020-03-22 DIAGNOSIS — G894 Chronic pain syndrome: Secondary | ICD-10-CM

## 2020-03-22 DIAGNOSIS — D5720 Sickle-cell/Hb-C disease without crisis: Secondary | ICD-10-CM

## 2020-03-22 DIAGNOSIS — F1123 Opioid dependence with withdrawal: Secondary | ICD-10-CM

## 2020-03-22 DIAGNOSIS — I161 Hypertensive emergency: Secondary | ICD-10-CM

## 2020-03-22 DIAGNOSIS — D571 Sickle-cell disease without crisis: Secondary | ICD-10-CM

## 2020-03-22 DIAGNOSIS — I10 Essential (primary) hypertension: Secondary | ICD-10-CM

## 2020-03-22 MED ORDER — AMOXICILLIN-POT CLAVULANATE 875-125 MG PO TABS: 1.0000 | ORAL_TABLET | Freq: Two times a day (BID) | ORAL | 0 refills | Status: DC

## 2020-03-22 MED ORDER — OXYCODONE HCL 15 MG PO TABS: 15.0000 mg | ORAL_TABLET | Freq: Four times a day (QID) | ORAL | 0 refills | Status: DC | PRN

## 2020-03-22 MED ORDER — LOSARTAN POTASSIUM 100 MG PO TABS: 100.0000 mg | ORAL_TABLET | Freq: Every day | ORAL | 3 refills | Status: DC

## 2020-03-22 MED ORDER — CLONIDINE HCL 0.1 MG PO TABS: 0.3000 mg | ORAL_TABLET | Freq: Once | ORAL | Status: DC

## 2020-03-22 MED ORDER — IBUPROFEN 800 MG PO TABS: 800.0000 mg | ORAL_TABLET | Freq: Three times a day (TID) | ORAL | 0 refills | Status: DC | PRN

## 2020-03-22 MED ORDER — AMLODIPINE BESYLATE 10 MG PO TABS: 10.0000 mg | ORAL_TABLET | Freq: Every day | ORAL | 3 refills | Status: DC

## 2020-03-22 MED ORDER — CLONIDINE HCL 0.1 MG PO TABS
0.2000 mg | ORAL_TABLET | Freq: Once | ORAL | Status: AC
  Administered 2020-03-22: 0.2 mg via ORAL

## 2020-03-22 NOTE — Progress Notes (Signed)
[facility] [address] [place], [place]  [address] Phone:  [phone]   Fax:  [phone]   Established Patient Office Visit  Subjective:  Patient ID: Dean Webb, male    DOB: 1975/07/28  Age: 45 y.o. MRN: [id number]  CC:  Chief Complaint  Patient presents with  . Follow-up    Follow up , sickle cell, having rt arm , knot on rt side chin     HPI Dean Webb presents for follow up. He  has a past medical history of Asthma, GERD (gastroesophageal reflux disease), Hb-S/Hb-C disease (HCC) (1979), HTN (hypertension) (2008), Hypertension, Sickle cell anemia (HCC), and Sickle cell disease (HCC).   He is complaining of right arm 7.5-8/10 He admits that this is part of his normal pain. He also has some back pain. He is concern that his current regimen is not effective for his pain. He does feel like the Oxycodone starts to work 30 minutes after ingestion. He does not feel like it last as long.   Hypertension Patient is here for follow-up of elevated blood pressure. He is not exercising and is adherent to a low-salt diet. Blood pressure is not monitored at home. Cardiac symptoms: none. Patient denies chest pain, chest pressure/discomfort, claudication, dyspnea, exertional chest pressure/discomfort, fatigue, irregular heart beat, lower extremity edema, near-syncope, orthopnea, palpitations, paroxysmal nocturnal dyspnea, syncope and tachypnea. Cardiovascular risk factors: hypertension, male gender, obesity (BMI >= 30 kg/m2) and smoking/ tobacco exposure. Use of agents associated with hypertension: NSAIDS. History of target organ damage: none.  Past Medical History:  Diagnosis Date  . Asthma   . GERD (gastroesophageal reflux disease)   . Hb-S/Hb-C disease (HCC) 1979  . HTN (hypertension) 2008  . Hypertension   . Sickle cell anemia (HCC)   . Sickle cell disease (HCC)     Past Surgical History:  Procedure Laterality Date  . CHOLECYSTECTOMY      Family  History  Problem Relation Age of Onset  . Sickle cell trait Mother   . Hypertension Mother   . Asthma Mother   . Sickle cell trait Father   . Congestive Heart Failure Father   . Hypertension Sister   . Sickle cell anemia Daughter   . Hypertension Daughter   . Diabetes Daughter     Social History   Socioeconomic History  . Marital status: Single    Spouse name: Not on file  . Number of children: Not on file  . Years of education: Not on file  . Highest education level: Not on file  Occupational History  . Not on file  Tobacco Use  . Smoking status: Current Every Day Smoker    Packs/day: 0.50    Years: 17.00    Pack years: 8.50  . Smokeless tobacco: Never Used  Vaping Use  . Vaping Use: Never used  Substance and Sexual Activity  . Alcohol use: Yes    Comment: Occasional drinker  . Drug use: Not Currently    Types: Marijuana  . Sexual activity: Yes  Other Topics Concern  . Not on file  Social History Narrative  . Not on file   Social Determinants of Health   Financial Resource Strain: Not on file  Food Insecurity: Not on file  Transportation Needs: Not on file  Physical Activity: Not on file  Stress: Not on file  Social Connections: Not on file  Intimate Partner Violence: Not on file    Outpatient Medications Prior to Visit  Medication Sig Dispense Refill  . albuterol (VENTOLIN HFA) 108 (90 Base) MCG/ACT inhaler Inhale 2 puffs into the lungs every 6 (six) hours as needed for wheezing or shortness of breath. 8 g 1  . cetirizine (ZYRTEC) 10 MG tablet Take 1 tablet (10 mg total) by mouth daily as needed for allergies. 30 tablet 1  . ergocalciferol (VITAMIN D2) 1.25 MG (50000 UT) capsule Take 1 capsule (50,000 Units total) by mouth once a week. X 12 weeks. 12 capsule 3  . folic acid (FOLVITE) 1 MG tablet Take 1 tablet (1 mg total) by mouth daily. 90 tablet 1  . nicotine (NICODERM CQ - DOSED IN MG/24 HOURS) 21 mg/24hr patch Place 1 patch (21 mg total) onto the skin  daily. 28 patch 0  . omeprazole (PRILOSEC) 20 MG capsule Take 1 capsule (20 mg total) by mouth 2 (two) times daily before a meal. (Patient taking differently: Take 20 mg by mouth 2 (two) times daily as needed (indigestion).) 28 capsule 3  . amLODipine (NORVASC) 10 MG tablet Take 1 tablet (10 mg total) by mouth daily. 30 tablet 11  . ibuprofen (ADVIL) 800 MG tablet Take 1 tablet (800 mg total) by mouth every 8 (eight) hours as needed. 30 tablet 0  . losartan (COZAAR) 50 MG tablet Take 1 tablet (50 mg total) by mouth daily. 90 tablet 3   No facility-administered medications prior to visit.    No Known Allergies  ROS Review of Systems  Constitutional: Positive for fatigue. Negative for appetite change, chills and fever.  HENT: Negative for congestion.        Friday Neck swelling worse with eating it has decreased but not completely resolved  Eyes: Positive for visual disturbance (blurred).  Respiratory: Negative for shortness of breath.   Cardiovascular: Negative for chest pain.  Musculoskeletal: Positive for back pain.  Neurological: Negative for dizziness, numbness and headaches.      Objective:    Physical Exam HENT:     Head: Normocephalic and atraumatic.     Right Ear: Tympanic membrane normal.     Left Ear: Tympanic membrane normal.     Nose: Nose normal.     Mouth/Throat:     Mouth: Mucous membranes are moist.  Neck:     Trachea: Trachea normal.     Comments: Bilateral tonsillar L>R  Cardiovascular:     Rate and Rhythm: Normal rate and regular rhythm.     Pulses: Normal pulses.     Heart sounds: Normal heart sounds.  Pulmonary:     Effort: Pulmonary effort is normal.     Breath sounds: Normal breath sounds.  Abdominal:     Palpations: Abdomen is soft.  Musculoskeletal:        General: Normal range of motion.     Cervical back: Normal range of motion. No pain with movement. Normal range of motion.  Skin:    General: Skin is warm and dry.     Capillary Refill:  Capillary refill takes less than 2 seconds.  Neurological:     General: No focal deficit present.     Mental Status: He is alert.  Psychiatric:        Mood and Affect: Mood normal.        Behavior: Behavior normal.        Thought Content: Thought content normal.        Judgment: Judgment normal.     BP (!) 173/112   Pulse 95   Temp 97.7 F (  36.5 C) (Temporal)   Ht 5\' 7"  (1.702 m)   Wt 202 lb 3.2 oz (91.7 kg)   SpO2 98%   BMI 31.67 kg/m  Wt Readings from Last 3 Encounters:  03/22/20 202 lb 3.2 oz (91.7 kg)  02/19/20 197 lb 9.6 oz (89.6 kg)  11/26/19 200 lb (90.7 kg)     Health Maintenance Due  Topic Date Due  . COVID-19 Vaccine (1) Never done    There are no preventive care reminders to display for this patient.  No results found for: TSH Lab Results  Component Value Date   WBC 6.4 02/19/2020   HGB 15.0 02/19/2020   HCT 43.5 02/19/2020   MCV 83 02/19/2020   PLT 300 02/19/2020   Lab Results  Component Value Date   NA 139 02/19/2020   K 4.8 02/19/2020   CO2 22 02/19/2020   GLUCOSE 80 02/19/2020   BUN 6 02/19/2020   CREATININE 1.10 02/19/2020   BILITOT 1.6 (H) 02/19/2020   ALKPHOS 97 02/19/2020   AST 31 02/19/2020   ALT 17 02/19/2020   PROT 7.8 02/19/2020   ALBUMIN 5.0 02/19/2020   CALCIUM 10.8 (H) 02/19/2020   ANIONGAP 10 12/01/2019   Lab Results  Component Value Date   CHOL  11/06/2008    84        ATP III CLASSIFICATION:  <200     mg/dL   Desirable  11/08/2008  mg/dL   Borderline High  382-505    mg/dL   High          Lab Results  Component Value Date   HDL 31 (L) 11/06/2008   Lab Results  Component Value Date   [facility]  11/06/2008    44        Total Cholesterol/HDL:CHD Risk Coronary Heart Disease Risk Table                     Men   Women  1/2 Average Risk   3.4   3.3  Average Risk       5.0   4.4  2 X Average Risk   9.6   7.1  3 X Average Risk  23.4   11.0        Use the calculated Patient Ratio above and the CHD Risk Table to  determine the patient's CHD Risk.        ATP III CLASSIFICATION (LDL):  <100     mg/dL   Optimal  11/08/2008  mg/dL   Near or Above                    Optimal  130-159  mg/dL   Borderline  673-419  mg/dL   High  379-024     mg/dL   Very High   Lab Results  Component Value Date   TRIG 44 11/06/2008   Lab Results  Component Value Date   CHOLHDL 2.7 11/06/2008   No results found for: HGBA1C    Assessment & Plan:   Problem List Items Addressed This Visit      Cardiovascular and Mediastinum   Essential hypertension (Chronic) Persistent Increased Losartan 100 mg daily Encouraged on going compliance with current medication regimen Encouraged home monitoring and recording BP <130/80 Eating a heart-healthy diet with less salt Encouraged regular physical activity  Recommend Weight loss      Relevant Medications   amLODipine (NORVASC) 10 MG tablet   losartan (COZAAR) 100 MG tablet  Nervous and Auditory   Opioid dependence with withdrawal (HCC)     Other   Chronic pain syndrome Will continue with current regimen for now further evaluation os SCD with labs   Relevant Medications   ibuprofen (ADVIL) 800 MG tablet   Other Relevant Orders   Sickle Cell Panel   [id number] 11+Oxyco+Alc+Crt-Bund   Sickle cell-hemoglobin C disease without crisis (HCC)   Relevant Medications   ibuprofen (ADVIL) 800 MG tablet   Other Relevant Orders   Hgb Fractionation Cascade   Chronic prescription opiate use (Chronic)   Relevant Orders   Sickle Cell Panel   [id number] 11+Oxyco+Alc+Crt-Bund    Other Visit Diagnoses    Hypertensive emergency without congestive heart failure    -  Primary Patient treated within the office BP 137/75   Relevant Medications   cloNIDine (CATAPRES) tablet 0.2 mg (Completed)   amLODipine (NORVASC) 10 MG tablet   losartan (COZAAR) 100 MG tablet   Hb-SS disease without crisis (HCC)     Ensure adequate hydration. Move frequently to reduce venous thromboembolism  risk. Avoid situations that could lead to dehydration or could exacerbate pain Discussed S&S of infection, seizures, stroke acute chest, DVT and how important it is to seek medical attention Take medication as directed along with pain contract and overall compliance Discussed the risk related to opiate use (addition, tolerance and dependency)     Relevant Orders   Hgb Fractionation Cascade   Secondary hypertension       Relevant Medications   cloNIDine (CATAPRES) tablet 0.2 mg (Completed)   amLODipine (NORVASC) 10 MG tablet   losartan (COZAAR) 100 MG tablet   Other Relevant Orders   Culture, Group A Strep   Glands swollen     Empirical anbx treatment due to symptoms and history.  Throat culture pending Encourage completion of anbx even when symptoms improve.  Discussed resistance with anbx overuse Discussed allergic reactions with anbx.    Relevant Orders   Culture, Group A Strep      Meds ordered this encounter  Medications  . cloNIDine (CATAPRES) tablet 0.2 mg  . ibuprofen (ADVIL) 800 MG tablet    Sig: Take 1 tablet (800 mg total) by mouth every 8 (eight) hours as needed.    Dispense:  30 tablet    Refill:  0    Order Specific Question:   Supervising Provider    Answer:   Quentin Angst [id number]  . amLODipine (NORVASC) 10 MG tablet    Sig: Take 1 tablet (10 mg total) by mouth daily.    Dispense:  90 tablet    Refill:  3    Order Specific Question:   Supervising Provider    Answer:   Quentin Angst [id number]  . losartan (COZAAR) 100 MG tablet    Sig: Take 1 tablet (100 mg total) by mouth daily.    Dispense:  90 tablet    Refill:  3    Order Specific Question:   Supervising Provider    Answer:   Quentin Angst [id number]    Follow-up: Return in about 2 months (around 05/20/2020).    Barbette Merino, NP

## 2020-03-23 LAB — DRUG SCREEN 764883 11+OXYCO+ALC+CRT-BUND
Amphetamines, Urine: NEGATIVE ng/mL
BENZODIAZ UR QL: NEGATIVE ng/mL
Barbiturate: NEGATIVE ng/mL
Cannabinoid Quant, Ur: NEGATIVE ng/mL
Cocaine (Metabolite): NEGATIVE ng/mL
Creatinine: 71.8 mg/dL (ref 20.0–300.0)
Ethanol: NEGATIVE %
Meperidine: NEGATIVE ng/mL
Methadone Screen, Urine: NEGATIVE ng/mL
OPIATE SCREEN URINE: NEGATIVE ng/mL
Oxycodone/Oxymorphone, Urine: NEGATIVE ng/mL
Phencyclidine: NEGATIVE ng/mL
Propoxyphene: NEGATIVE ng/mL
Tramadol: NEGATIVE ng/mL
pH, Urine: 7.9 (ref 4.5–8.9)

## 2020-03-24 ENCOUNTER — Encounter: Payer: Self-pay | Admitting: Nurse Practitioner

## 2020-03-25 LAB — CULTURE, GROUP A STREP: Strep A Culture: NEGATIVE

## 2020-03-27 LAB — CMP14+CBC/D/PLT+FER+RETIC+V...
ALT: 20 IU/L (ref 0–44)
AST: 27 IU/L (ref 0–40)
Albumin/Globulin Ratio: 1.5 (ref 1.2–2.2)
Albumin: 4.4 g/dL (ref 4.0–5.0)
Alkaline Phosphatase: 92 IU/L (ref 44–121)
BUN/Creatinine Ratio: 7 — ABNORMAL LOW (ref 9–20)
BUN: 8 mg/dL (ref 6–24)
Basophils Absolute: 0.1 10*3/uL (ref 0.0–0.2)
Basos: 1 %
Bilirubin Total: 1.7 mg/dL — ABNORMAL HIGH (ref 0.0–1.2)
CO2: 26 mmol/L (ref 20–29)
Calcium: 11.5 mg/dL — ABNORMAL HIGH (ref 8.7–10.2)
Chloride: 100 mmol/L (ref 96–106)
Creatinine, Ser: 1.16 mg/dL (ref 0.76–1.27)
EOS (ABSOLUTE): 0.5 10*3/uL — ABNORMAL HIGH (ref 0.0–0.4)
Eos: 5 %
Ferritin: 190 ng/mL (ref 30–400)
GFR calc Af Amer: 89 mL/min/{1.73_m2} (ref >59)
GFR calc non Af Amer: 77 mL/min/{1.73_m2} (ref >59)
Globulin, Total: 3 g/dL (ref 1.5–4.5)
Glucose: 84 mg/dL (ref 65–99)
Hematocrit: 42.4 % (ref 37.5–51.0)
Hemoglobin: 14.7 g/dL (ref 13.0–17.7)
Immature Grans (Abs): 0 10*3/uL (ref 0.0–0.1)
Immature Granulocytes: 0 %
Lymphocytes Absolute: 3.6 10*3/uL — ABNORMAL HIGH (ref 0.7–3.1)
Lymphs: 38 %
MCH: 29.3 pg (ref 26.6–33.0)
MCHC: 34.7 g/dL (ref 31.5–35.7)
MCV: 85 fL (ref 79–97)
Monocytes Absolute: 1.1 10*3/uL — ABNORMAL HIGH (ref 0.1–0.9)
Monocytes: 12 %
NRBC: 2 % — ABNORMAL HIGH (ref 0–0)
Neutrophils Absolute: 4.3 10*3/uL (ref 1.4–7.0)
Neutrophils: 44 %
Platelets: 378 10*3/uL (ref 150–450)
Potassium: 4.9 mmol/L (ref 3.5–5.2)
RBC: 5.02 x10E6/uL (ref 4.14–5.80)
RDW: 16.8 % — ABNORMAL HIGH (ref 11.6–15.4)
Retic Ct Pct: 3.8 % — ABNORMAL HIGH (ref 0.6–2.6)
Sodium: 138 mmol/L (ref 134–144)
Total Protein: 7.4 g/dL (ref 6.0–8.5)
Vit D, 25-Hydroxy: 26.4 ng/mL — ABNORMAL LOW (ref 30.0–100.0)
WBC: 9.5 10*3/uL (ref 3.4–10.8)

## 2020-03-27 LAB — HGB FRAC BY HPLC+SOLUBILITY
Hgb A2: 3.4 % — ABNORMAL HIGH (ref 1.8–3.2)
Hgb A: 0 % — ABNORMAL LOW (ref 96.4–98.8)
Hgb C: 48.9 % — ABNORMAL HIGH
Hgb E: 0 %
Hgb F: 5.8 % — ABNORMAL HIGH (ref 0.0–2.0)
Hgb S: 41.9 % — ABNORMAL HIGH
Hgb Solubility: POSITIVE — AB
Hgb Variant: 0 %

## 2020-03-27 LAB — HGB FRACTIONATION CASCADE
Hgb A2: 2.7 % (ref 1.8–3.2)
Hgb A: 0 % — ABNORMAL LOW (ref 96.4–98.8)
Hgb F: 8.1 % — ABNORMAL HIGH (ref 0.0–2.0)
Hgb S: 45 % — ABNORMAL HIGH

## 2020-05-20 ENCOUNTER — Ambulatory Visit (INDEPENDENT_AMBULATORY_CARE_PROVIDER_SITE_OTHER): Payer: Medicaid Other | Admitting: Nurse Practitioner

## 2020-05-20 ENCOUNTER — Other Ambulatory Visit: Payer: Self-pay

## 2020-05-20 ENCOUNTER — Encounter: Payer: Self-pay | Admitting: Nurse Practitioner

## 2020-05-20 VITALS — BP 152/98 | HR 97 | Temp 97.0°F | Ht 67.0 in | Wt 208.0 lb

## 2020-05-20 DIAGNOSIS — D5720 Sickle-cell/Hb-C disease without crisis: Secondary | ICD-10-CM

## 2020-05-20 DIAGNOSIS — Z79891 Long term (current) use of opiate analgesic: Secondary | ICD-10-CM | POA: Diagnosis not present

## 2020-05-20 DIAGNOSIS — I10 Essential (primary) hypertension: Secondary | ICD-10-CM | POA: Diagnosis not present

## 2020-05-20 DIAGNOSIS — D571 Sickle-cell disease without crisis: Secondary | ICD-10-CM

## 2020-05-20 MED ORDER — IBUPROFEN 800 MG PO TABS: 800.0000 mg | ORAL_TABLET | Freq: Three times a day (TID) | ORAL | 11 refills | Status: AC | PRN

## 2020-05-20 MED ORDER — OXYCODONE HCL 15 MG PO TABS: 15.0000 mg | ORAL_TABLET | Freq: Four times a day (QID) | ORAL | 0 refills | Status: DC | PRN

## 2020-05-20 MED ORDER — FOLIC ACID 1 MG PO TABS: 1.0000 mg | ORAL_TABLET | Freq: Every day | ORAL | 11 refills | Status: AC

## 2020-05-20 NOTE — Patient Instructions (Signed)
Sickle Cell Anemia, Adult  Sickle cell anemia is a condition where your red blood cells are shaped like sickles. Red blood cells carry oxygen through the body. Sickle-shaped cells do not live as long as normal red blood cells. They also clump together and block blood from flowing through the blood vessels. This prevents the body from getting enough oxygen. Sickle cell anemia causes organ damage and pain. It also increases the risk of infection. Follow these instructions at home: Medicines  Take over-the-counter and prescription medicines only as told by your doctor.  If you were prescribed an antibiotic medicine, take it as told by your doctor. Do not stop taking the antibiotic even if you start to feel better.  If you develop a fever, do not take medicines to lower the fever right away. Tell your doctor about the fever. Managing pain, stiffness, and swelling  Try these methods to help with pain: ? Use a heating pad. ? Take a warm bath. ? Distract yourself, such as by watching TV. Eating and drinking  Drink enough fluid to keep your pee (urine) clear or pale yellow. Drink more in hot weather and during exercise.  Limit or avoid alcohol.  Eat a healthy diet. Eat plenty of fruits, vegetables, whole grains, and lean protein.  Take vitamins and supplements as told by your doctor. Traveling  When traveling, keep these with you: ? Your medical information. ? The names of your doctors. ? Your medicines.  If you need to take an airplane, talk to your doctor first. Activity  Rest often.  Avoid exercises that make your heart beat much faster, such as jogging. General instructions  Do not use products that have nicotine or tobacco, such as cigarettes and e-cigarettes. If you need help quitting, ask your doctor.  Consider wearing a medical alert bracelet.  Avoid being in high places (high altitudes), such as mountains.  Avoid very hot or cold temperatures.  Avoid places where the  temperature changes a lot.  Keep all follow-up visits as told by your doctor. This is important. Contact a doctor if:  A joint hurts.  Your feet or hands hurt or swell.  You feel tired (fatigued). Get help right away if:  You have symptoms of infection. These include: ? Fever. ? Chills. ? Being very tired. ? Irritability. ? Poor eating. ? Throwing up (vomiting).  You feel dizzy or faint.  You have new stomach pain, especially on the left side.  You have a an erection (priapism) that lasts more than 4 hours.  You have numbness in your arms or legs.  You have a hard time moving your arms or legs.  You have trouble talking.  You have pain that does not go away when you take medicine.  You are short of breath.  You are breathing fast.  You have a long-term cough.  You have pain in your chest.  You have a bad headache.  You have a stiff neck.  Your stomach looks bloated even though you did not eat much.  Your skin is pale.  You suddenly cannot see well. Summary  Sickle cell anemia is a condition where your red blood cells are shaped like sickles.  Follow your doctor's advice on ways to manage pain, food to eat, activities to do, and steps to take for safe travel.  Get medical help right away if you have any signs of infection, such as a fever. This information is not intended to replace advice given to you by   your health care provider. Make sure you discuss any questions you have with your health care provider. Document Revised: 06/26/2019 Document Reviewed: 06/26/2019 Elsevier Patient Education  2021 [employer] Inc[employer]   Hypertension, Adult Hypertension is another name for high blood pressure. High blood pressure forces your heart to work harder to pump blood. This can cause problems over time. There are two numbers in a blood pressure reading. There is a top number (systolic) over a bottom number (diastolic). It is best to have a blood pressure that is below  120/80. Healthy choices can help lower your blood pressure, or you may need medicine to help lower it. What are the causes? The cause of this condition is not known. Some conditions may be related to high blood pressure. What increases the risk?  Smoking.  Having type 2 diabetes mellitus, high cholesterol, or both.  Not getting enough exercise or physical activity.  Being overweight.  Having too much fat, sugar, calories, or salt (sodium) in your diet.  Drinking too much alcohol.  Having long-term (chronic) kidney disease.  Having a family history of high blood pressure.  Age. Risk increases with age.  Race. You may be at higher risk if you are African American.  Gender. Men are at higher risk than women before age 29. After age 33, women are at higher risk than men.  Having obstructive sleep apnea.  Stress. What are the signs or symptoms?  High blood pressure may not cause symptoms. Very high blood pressure (hypertensive crisis) may cause: ? Headache. ? Feelings of worry or nervousness (anxiety). ? Shortness of breath. ? Nosebleed. ? A feeling of being sick to your stomach (nausea). ? Throwing up (vomiting). ? Changes in how you see. ? Very bad chest pain. ? Seizures. How is this treated?  This condition is treated by making healthy lifestyle changes, such as: ? Eating healthy foods. ? Exercising more. ? Drinking less alcohol.  Your health care provider may prescribe medicine if lifestyle changes are not enough to get your blood pressure under control, and if: ? Your top number is above 130. ? Your bottom number is above 80.  Your personal target blood pressure may vary. Follow these instructions at home: Eating and drinking  If told, follow the DASH eating plan. To follow this plan: ? Fill one half of your plate at each meal with fruits and vegetables. ? Fill one fourth of your plate at each meal with whole grains. Whole grains include whole-wheat pasta,  brown rice, and whole-grain bread. ? Eat or drink low-fat dairy products, such as skim milk or low-fat yogurt. ? Fill one fourth of your plate at each meal with low-fat (lean) proteins. Low-fat proteins include fish, chicken without skin, eggs, beans, and tofu. ? Avoid fatty meat, cured and processed meat, or chicken with skin. ? Avoid pre-made or processed food.  Eat less than 1,500 mg of salt each day.  Do not drink alcohol if: ? Your doctor tells you not to drink. ? You are pregnant, may be pregnant, or are planning to become pregnant.  If you drink alcohol: ? Limit how much you use to:  0-1 drink a day for women.  0-2 drinks a day for men. ? Be aware of how much alcohol is in your drink. In the [place] one drink equals one 12 oz bottle of beer (355 mL), one 5 oz glass of wine (148 mL), or one 1 oz glass of hard liquor (44 mL).   Lifestyle  Work with your doctor to stay at a healthy weight or to lose weight. Ask your doctor what the best weight is for you.  Get at least 30 minutes of exercise most days of the week. This may include walking, swimming, or biking.  Get at least 30 minutes of exercise that strengthens your muscles (resistance exercise) at least 3 days a week. This may include lifting weights or doing Pilates.  Do not use any products that contain nicotine or tobacco, such as cigarettes, e-cigarettes, and chewing tobacco. If you need help quitting, ask your doctor.  Check your blood pressure at home as told by your doctor.  Keep all follow-up visits as told by your doctor. This is important.   Medicines  Take over-the-counter and prescription medicines only as told by your doctor. Follow directions carefully.  Do not skip doses of blood pressure medicine. The medicine does not work as well if you skip doses. Skipping doses also puts you at risk for problems.  Ask your doctor about side effects or reactions to medicines that you should watch for. Contact a doctor  if you:  Think you are having a reaction to the medicine you are taking.  Have headaches that keep coming back (recurring).  Feel dizzy.  Have swelling in your ankles.  Have trouble with your vision. Get help right away if you:  Get a very bad headache.  Start to feel mixed up (confused).  Feel weak or numb.  Feel faint.  Have very bad pain in your: ? Chest. ? Belly (abdomen).  Throw up more than once.  Have trouble breathing. Summary  Hypertension is another name for high blood pressure.  High blood pressure forces your heart to work harder to pump blood.  For most people, a normal blood pressure is less than 120/80.  Making healthy choices can help lower blood pressure. If your blood pressure does not get lower with healthy choices, you may need to take medicine. This information is not intended to replace advice given to you by your health care provider. Make sure you discuss any questions you have with your health care provider. Document Revised: 10/10/2017 Document Reviewed: 10/10/2017 Elsevier Patient Education  2021 [employer].

## 2020-05-20 NOTE — Progress Notes (Signed)
[facility] [place] [address] [place], [place]  [address] Phone:  [phone]   Fax:  [phone]   Established Patient Office Visit  Subjective:  Patient ID: Dean Webb, male    DOB: Aug 21, 1975  Age: 45 y.o. MRN: [id number]  CC:  Chief Complaint  Patient presents with  . Follow-up    2 month follow up , having pain level  is 7 in arm and neck rt side , discuss getting refill on his medication to last until his appts.     HPI Dean Webb presents for follow up. He  has a past medical history of Asthma, GERD (gastroesophageal reflux disease), Hb-S/Hb-C disease (HCC) (1979), HTN (hypertension) (2008), Hypertension, Sickle cell anemia (HCC), and Sickle cell disease (HCC).    Hypertension Patient is here for follow-up of elevated blood pressure. He is not exercising and is not adherent to a low-salt diet. Blood pressure is not monitored at home. Cardiac symptoms: none. Patient denies chest pain, dyspnea, exertional chest pressure/discomfort, fatigue, lower extremity edema, near-syncope, orthopnea, palpitations, paroxysmal nocturnal dyspnea and tachypnea. Cardiovascular risk factors: hypertension, male gender and SCD. Use of agents associated with hypertension: NSAIDS. History of target organ damage: none.  Past Medical History:  Diagnosis Date  . Asthma   . GERD (gastroesophageal reflux disease)   . Hb-S/Hb-C disease (HCC) 1979  . HTN (hypertension) 2008  . Hypertension   . Sickle cell anemia (HCC)   . Sickle cell disease (HCC)     Past Surgical History:  Procedure Laterality Date  . CHOLECYSTECTOMY      Family History  Problem Relation Age of Onset  . Sickle cell trait Mother   . Hypertension Mother   . Asthma Mother   . Sickle cell trait Father   . Congestive Heart Failure Father   . Hypertension Sister   . Sickle cell anemia Daughter   . Hypertension Daughter   . Diabetes Daughter     Social History   Socioeconomic History  .  Marital status: Single    Spouse name: Not on file  . Number of children: Not on file  . Years of education: Not on file  . Highest education level: Not on file  Occupational History  . Not on file  Tobacco Use  . Smoking status: Current Every Day Smoker    Packs/day: 0.50    Years: 17.00    Pack years: 8.50  . Smokeless tobacco: Never Used  Vaping Use  . Vaping Use: Never used  Substance and Sexual Activity  . Alcohol use: Yes    Comment: Occasional drinker  . Drug use: Not Currently    Types: Marijuana  . Sexual activity: Yes  Other Topics Concern  . Not on file  Social History Narrative  . Not on file   Social Determinants of Health   Financial Resource Strain: Not on file  Food Insecurity: Not on file  Transportation Needs: Not on file  Physical Activity: Not on file  Stress: Not on file  Social Connections: Not on file  Intimate Partner Violence: Not on file    Outpatient Medications Prior to Visit  Medication Sig Dispense Refill  . albuterol (VENTOLIN HFA) 108 (90 Base) MCG/ACT inhaler Inhale 2 puffs into the lungs every 6 (six) hours as needed for wheezing or shortness of breath. 8 g 1  . amLODipine (NORVASC) 10 MG tablet Take 1 tablet (10 mg total) by mouth daily. 90 tablet 3  . cetirizine (  ZYRTEC) 10 MG tablet Take 1 tablet (10 mg total) by mouth daily as needed for allergies. 30 tablet 1  . ergocalciferol (VITAMIN D2) 1.25 MG (50000 UT) capsule Take 1 capsule (50,000 Units total) by mouth once a week. X 12 weeks. 12 capsule 3  . losartan (COZAAR) 100 MG tablet Take 1 tablet (100 mg total) by mouth daily. 90 tablet 3  . omeprazole (PRILOSEC) 20 MG capsule Take 1 capsule (20 mg total) by mouth 2 (two) times daily before a meal. (Patient taking differently: Take 20 mg by mouth 2 (two) times daily as needed (indigestion).) 28 capsule 3  . folic acid (FOLVITE) 1 MG tablet Take 1 tablet (1 mg total) by mouth daily. 90 tablet 1  . ibuprofen (ADVIL) 800 MG tablet Take  1 tablet (800 mg total) by mouth every 8 (eight) hours as needed. 30 tablet 0  . nicotine (NICODERM CQ - DOSED IN MG/24 HOURS) 21 mg/24hr patch Place 1 patch (21 mg total) onto the skin daily. (Patient not taking: Reported on 05/20/2020) 28 patch 0  . amoxicillin-clavulanate (AUGMENTIN) 875-125 MG tablet Take 1 tablet by mouth 2 (two) times daily. 20 tablet 0   Facility-Administered Medications Prior to Visit  Medication Dose Route Frequency Provider Last Rate Last Admin  . cloNIDine (CATAPRES) tablet 0.3 mg  0.3 mg Oral Once Barbette Merino, NP        No Known Allergies  ROS Review of Systems  Respiratory: Negative for shortness of breath.   Cardiovascular: Negative for chest pain.  Neurological: Negative for dizziness and headaches.      Objective:    Physical Exam HENT:     Head: Normocephalic and atraumatic.     Nose: Nose normal.     Mouth/Throat:     Mouth: Mucous membranes are moist.  Cardiovascular:     Rate and Rhythm: Normal rate and regular rhythm.     Pulses: Normal pulses.     Heart sounds: Normal heart sounds.  Pulmonary:     Effort: Pulmonary effort is normal.     Breath sounds: Normal breath sounds.  Musculoskeletal:     Cervical back: Normal range of motion.     Right lower leg: No edema.     Left lower leg: No edema.  Skin:    General: Skin is warm and dry.     Capillary Refill: Capillary refill takes less than 2 seconds.  Neurological:     General: No focal deficit present.     Mental Status: He is alert and oriented to person, place, and time.  Psychiatric:        Mood and Affect: Mood normal.        Behavior: Behavior normal.        Thought Content: Thought content normal.        Judgment: Judgment normal.     BP (!) 152/98   Pulse 97   Temp (!) 97 F (36.1 C) (Temporal)   Ht 5\' 7"  (1.702 m)   Wt 208 lb (94.3 kg)   SpO2 97%   BMI 32.58 kg/m  Wt Readings from Last 3 Encounters:  05/20/20 208 lb (94.3 kg)  03/22/20 202 lb 3.2 oz (91.7  kg)  02/19/20 197 lb 9.6 oz (89.6 kg)     Health Maintenance Due  Topic Date Due  . COVID-19 Vaccine (1) Never done    There are no preventive care reminders to display for this patient.  No results found for: TSH  Lab Results  Component Value Date   WBC 9.5 03/22/2020   HGB 14.7 03/22/2020   HCT 42.4 03/22/2020   MCV 85 03/22/2020   PLT 378 03/22/2020   Lab Results  Component Value Date   NA 138 03/22/2020   K 4.9 03/22/2020   CO2 26 03/22/2020   GLUCOSE 84 03/22/2020   BUN 8 03/22/2020   CREATININE 1.16 03/22/2020   BILITOT 1.7 (H) 03/22/2020   ALKPHOS 92 03/22/2020   AST 27 03/22/2020   ALT 20 03/22/2020   PROT 7.4 03/22/2020   ALBUMIN 4.4 03/22/2020   CALCIUM 11.5 (H) 03/22/2020   ANIONGAP 10 12/01/2019   Lab Results  Component Value Date   CHOL  11/06/2008    84        ATP III CLASSIFICATION:  <200     mg/dL   Desirable  295-621200-239  mg/dL   Borderline High  >=308>=240    mg/dL   High          Lab Results  Component Value Date   HDL 31 (L) 11/06/2008   Lab Results  Component Value Date   [facility]  11/06[facility]2008    44        Total Cholesterol/HDL:CHD Risk Coronary Heart Disease Risk Table                     Men   Women  1/2 Average Risk   3.4   3.3  Average Risk       5.0   4.4  2 X Average Risk   9.6   7.1  3 X Average Risk  23.4   11.0        Use the calculated Patient Ratio above and the CHD Risk Table to determine the patient's CHD Risk.        ATP III CLASSIFICATION (LDL):  <100     mg/dL   Optimal  657-846100-129  mg/dL   Near or Above                    Optimal  130-159  mg/dL   Borderline  [id number]  mg/dL   High  >841>190     mg/dL   Very High   Lab Results  Component Value Date   TRIG 44 11/06/2008   Lab Results  Component Value Date   CHOLHDL 2.7 11/06/2008   No results found for: HGBA1C    Assessment & Plan:   Problem List Items Addressed This Visit      Cardiovascular and Mediastinum   Essential hypertension  (Chronic) Persistent Discussion on the importance of compliance with medication Lifestyle modifications low salt low fat and cholesterol and smoking cessation     Other   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 Move frequently to reduce venous thromboembolism risk. Avoid situations that could lead to dehydration or could exacerbate pain Discussed S&S of infection, seizures, stroke acute chest, DVT and how important it is to seek medical attention Take medication as directed along with pain contract and overall compliance Discussed the risk related to opiate use (addition, tolerance and dependency)     Relevant Medications   folic acid (FOLVITE) 1 MG tablet   ibuprofen (ADVIL) 800 MG tablet   Chronic prescription opiate use (Chronic)   Relevant Orders   [id number] 11+Oxyco+Alc+Crt-Bund (Completed)      Meds ordered this encounter  Medications  . folic acid (FOLVITE) 1 MG tablet  Sig: Take 1 tablet (1 mg total) by mouth daily.    Dispense:  30 tablet    Refill:  11    Order Specific Question:   Supervising Provider    Answer:   Quentin Angst [id number]  . ibuprofen (ADVIL) 800 MG tablet    Sig: Take 1 tablet (800 mg total) by mouth every 8 (eight) hours as needed.    Dispense:  30 tablet    Refill:  11    Order Specific Question:   Supervising Provider    Answer:   Quentin Angst [id number]  . oxyCODONE (ROXICODONE) 15 MG immediate release tablet    Sig: Take 1 tablet (15 mg total) by mouth every 6 (six) hours as needed for up to 15 days for pain.    Dispense:  60 tablet    Refill:  0    Order Specific Question:   Supervising Provider    Answer:   Quentin Angst [id number]    Follow-up: Return in about 3 months (around 08/19/2020) for Follow up [place] [id number].    Barbette Merino, NP

## 2020-05-20 NOTE — Progress Notes (Signed)
(  Key: [id number]) - [id number] Need help? Call us at [phone] Status Sent to [employer] The plan will fax you a determination, typically within 1 to 5 business days.  How do I follow up? Drug oxyCODONE HCl 15MG  tablets Form [facility] Prior Approval Request for Standard Drug Request Form Pharmacy Prior Approval Request for Standard Drug Request Form for [place] Medicaid and [place] [employer] [phone] Original Claim Info 75

## 2020-05-21 ENCOUNTER — Telehealth: Payer: Self-pay

## 2020-05-21 LAB — DRUG SCREEN 764883 11+OXYCO+ALC+CRT-BUND
Amphetamines, Urine: NEGATIVE ng/mL
BENZODIAZ UR QL: NEGATIVE ng/mL
Barbiturate: NEGATIVE ng/mL
Cannabinoid Quant, Ur: NEGATIVE ng/mL
Cocaine (Metabolite): NEGATIVE ng/mL
Creatinine: 101.7 mg/dL (ref 20.0–300.0)
Ethanol: NEGATIVE %
Meperidine: NEGATIVE ng/mL
Methadone Screen, Urine: NEGATIVE ng/mL
OPIATE SCREEN URINE: NEGATIVE ng/mL
Oxycodone/Oxymorphone, Urine: NEGATIVE ng/mL
Phencyclidine: NEGATIVE ng/mL
Propoxyphene: NEGATIVE ng/mL
Tramadol: NEGATIVE ng/mL
pH, Urine: 6.7 (ref 4.5–8.9)

## 2020-05-21 NOTE — Telephone Encounter (Signed)
Pt called to see if his authorization went thru for his OXYCODONE?

## 2020-05-21 NOTE — Telephone Encounter (Signed)
Spoke w/ pt  advised him that we have sent the PA through [place] [facility], that it still pending,once the PA has been approved we contact him along with pharmacy to advise him of the status of his PA.

## 2020-05-21 NOTE — Telephone Encounter (Signed)
Confirmation #:[id number] WBenefit Plan:MCAIDHealth Plan:[employer] Prior Approval [id number] PA Type:PHARMACYRecipient:Dean FLIPPINRecipient [id number] KBilling Provider:Billing Provider EX:[id number] Provider Name:CRYSTAL Dolan Amen Provider [phone] Submission Date:04/08/2022Status:APPROVEDEffective Begin Date:04/08/2022Effective End Date:10/05/2022Payer:[place] [employer]HS DIV OF HEALTH BENEFITS# of Attachments:1PA Documents:View Documents Attachments Attachment Type Attachment Control # Transmission Code MED REC  9898  UPLOAD Back to top Line Item 1 Status:APPROVEDDrug Name/Code:OXY TABDrug Code Type:DRUG NAMERequestedLength of Therapy:180 DAYSTotal Quantity:360.000Strength:ApprovedLength of Therapy:180 DAYSTotal Quantity:360.000Back to top Prior Approval Attachment

## 2020-06-14 ENCOUNTER — Other Ambulatory Visit: Payer: Self-pay | Admitting: Nurse Practitioner

## 2020-06-14 ENCOUNTER — Telehealth: Payer: Self-pay

## 2020-06-14 MED ORDER — OXYCODONE HCL 15 MG PO TABS: 15.0000 mg | ORAL_TABLET | Freq: Four times a day (QID) | ORAL | 0 refills | Status: DC | PRN

## 2020-06-14 NOTE — Telephone Encounter (Signed)
Med refill  Folic acid Oxycodone

## 2020-06-14 NOTE — Telephone Encounter (Signed)
Sent  Folic acid has refills

## 2020-07-05 ENCOUNTER — Telehealth: Payer: Self-pay

## 2020-07-05 NOTE — Telephone Encounter (Signed)
Med refill Oxycodone 

## 2020-07-07 ENCOUNTER — Other Ambulatory Visit: Payer: Self-pay

## 2020-07-07 ENCOUNTER — Other Ambulatory Visit: Payer: Self-pay | Admitting: Nurse Practitioner

## 2020-07-07 MED ORDER — OXYCODONE HCL 15 MG PO TABS: 15.0000 mg | ORAL_TABLET | Freq: Four times a day (QID) | ORAL | 0 refills | Status: DC | PRN

## 2020-07-07 NOTE — Progress Notes (Signed)
   [facility] [address] [place], [place]  [address] Phone:  [phone]   Fax:  [phone] 

## 2020-07-07 NOTE — Telephone Encounter (Signed)
sent 

## 2020-08-04 ENCOUNTER — Other Ambulatory Visit: Payer: Self-pay | Admitting: Nurse Practitioner

## 2020-08-04 ENCOUNTER — Telehealth: Payer: Self-pay

## 2020-08-04 MED ORDER — OXYCODONE HCL 15 MG PO TABS: 15.0000 mg | ORAL_TABLET | Freq: Four times a day (QID) | ORAL | 0 refills | Status: DC | PRN

## 2020-08-04 NOTE — Telephone Encounter (Signed)
Bp medicine & Oxycodone

## 2020-08-19 ENCOUNTER — Ambulatory Visit: Payer: Self-pay | Admitting: Nurse Practitioner

## 2020-08-26 ENCOUNTER — Encounter: Payer: Self-pay | Admitting: Nurse Practitioner

## 2020-08-26 ENCOUNTER — Telehealth: Payer: Self-pay

## 2020-08-26 ENCOUNTER — Ambulatory Visit (INDEPENDENT_AMBULATORY_CARE_PROVIDER_SITE_OTHER): Payer: Medicaid Other | Admitting: Nurse Practitioner

## 2020-08-26 ENCOUNTER — Other Ambulatory Visit: Payer: Self-pay

## 2020-08-26 VITALS — BP 165/114 | HR 80 | Temp 98.1°F | Ht 67.0 in | Wt 206.1 lb

## 2020-08-26 DIAGNOSIS — I161 Hypertensive emergency: Secondary | ICD-10-CM | POA: Diagnosis not present

## 2020-08-26 DIAGNOSIS — J4520 Mild intermittent asthma, uncomplicated: Secondary | ICD-10-CM | POA: Diagnosis not present

## 2020-08-26 DIAGNOSIS — K219 Gastro-esophageal reflux disease without esophagitis: Secondary | ICD-10-CM

## 2020-08-26 DIAGNOSIS — Z79891 Long term (current) use of opiate analgesic: Secondary | ICD-10-CM

## 2020-08-26 DIAGNOSIS — I10 Essential (primary) hypertension: Secondary | ICD-10-CM

## 2020-08-26 DIAGNOSIS — D5720 Sickle-cell/Hb-C disease without crisis: Secondary | ICD-10-CM | POA: Diagnosis not present

## 2020-08-26 LAB — POCT URINALYSIS DIPSTICK
Bilirubin, UA: NEGATIVE
Blood, UA: NEGATIVE
Glucose, UA: NEGATIVE
Ketones, UA: NEGATIVE
Leukocytes, UA: NEGATIVE
Nitrite, UA: NEGATIVE
Protein, UA: NEGATIVE
Spec Grav, UA: 1.02 (ref 1.010–1.025)
Urobilinogen, UA: 0.2 E.U./dL
pH, UA: 7 (ref 5.0–8.0)

## 2020-08-26 MED ORDER — CLONIDINE 0.2 MG/24HR TD PTWK: 0.2000 mg | MEDICATED_PATCH | TRANSDERMAL | 12 refills | Status: DC

## 2020-08-26 MED ORDER — OXYCODONE HCL 15 MG PO TABS: 15.0000 mg | ORAL_TABLET | Freq: Four times a day (QID) | ORAL | 0 refills | Status: DC | PRN

## 2020-08-26 MED ORDER — CETIRIZINE HCL 10 MG PO TABS: 10.0000 mg | ORAL_TABLET | Freq: Every day | ORAL | 1 refills | Status: DC | PRN

## 2020-08-26 MED ORDER — CLONIDINE HCL 0.1 MG PO TABS
0.1000 mg | ORAL_TABLET | Freq: Once | ORAL | Status: AC
  Administered 2020-08-26: 0.1 mg via ORAL

## 2020-08-26 MED ORDER — PANTOPRAZOLE SODIUM 40 MG PO TBEC: 40.0000 mg | DELAYED_RELEASE_TABLET | Freq: Every day | ORAL | 3 refills | Status: DC

## 2020-08-26 MED ORDER — OMEPRAZOLE 20 MG PO CPDR: 20.0000 mg | DELAYED_RELEASE_CAPSULE | Freq: Two times a day (BID) | ORAL | 3 refills | Status: DC

## 2020-08-26 MED ORDER — ALBUTEROL SULFATE HFA 108 (90 BASE) MCG/ACT IN AERS: 2.0000 | INHALATION_SPRAY | Freq: Four times a day (QID) | RESPIRATORY_TRACT | 1 refills | Status: DC | PRN

## 2020-08-26 NOTE — Telephone Encounter (Signed)
Clonidine Weekly Patch approved through [facility] PA# [id number].

## 2020-08-26 NOTE — Progress Notes (Signed)
[facility] Patient [facility] [address] [place], [place]  [address] Phone:  [phone]   Fax:  [phone]   Established Patient Office Visit  Subjective:  Patient ID: Dean Webb, male    DOB: 1975-05-17  Age: 45 y.o. MRN: [id number]  CC:  Chief Complaint  Patient presents with   Follow-up    3 month follow up; coughing up phlegm started about 1 week ago    HPI Jeffory Snelgrove Donna presents for follow up. He  has a past medical history of Asthma, GERD (gastroesophageal reflux disease), Hb-S/Hb-C disease (HCC) (1979), HTN (hypertension) (2008), Hypertension, Sickle cell anemia (HCC), and Sickle cell disease (HCC). ,  He reports that he is compliant with his antihypertensive medication; amlodipine 10 mg daily and losartan 100 mg daily.  He is working full-time and this is labor-intensive. Denies headache, dizziness, visual changes, shortness of breath, dyspnea on exertion, chest pain, nausea, vomiting or any edema.  He is unsure what his blood pressure is so elevated.  He is not doing any home monitoring  Past Medical History:  Diagnosis Date   Asthma    GERD (gastroesophageal reflux disease)    Hb-S/Hb-C disease (HCC) 1979   HTN (hypertension) 2008   Hypertension    Sickle cell anemia (HCC)    Sickle cell disease (HCC)     Past Surgical History:  Procedure Laterality Date   CHOLECYSTECTOMY      Family History  Problem Relation Age of Onset   Sickle cell trait Mother    Hypertension Mother    Asthma Mother    Sickle cell trait Father    Congestive Heart Failure Father    Hypertension Sister    Sickle cell anemia Daughter    Hypertension Daughter    Diabetes Daughter     Social History   Socioeconomic History   Marital status: Single    Spouse name: Not on file   Number of children: Not on file   Years of education: Not on file   Highest education level: Not on file  Occupational History   Not on file  Tobacco Use   Smoking status: Every Day     Packs/day: 0.50    Years: 17.00    Pack years: 8.50    Types: Cigarettes   Smokeless tobacco: Never  Vaping Use   Vaping Use: Never used  Substance and Sexual Activity   Alcohol use: Yes    Comment: Occasional drinker   Drug use: Not Currently    Types: Marijuana   Sexual activity: Yes  Other Topics Concern   Not on file  Social History Narrative   Not on file   Social Determinants of Health   Financial Resource Strain: Not on file  Food Insecurity: Not on file  Transportation Needs: Not on file  Physical Activity: Not on file  Stress: Not on file  Social Connections: Not on file  Intimate Partner Violence: Not on file    Outpatient Medications Prior to Visit  Medication Sig Dispense Refill   amLODipine (NORVASC) 10 MG tablet Take 1 tablet (10 mg total) by mouth daily. 90 tablet 3   folic acid (FOLVITE) 1 MG tablet Take 1 tablet (1 mg total) by mouth daily. 30 tablet 11   ibuprofen (ADVIL) 800 MG tablet Take 1 tablet (800 mg total) by mouth every 8 (eight) hours as needed. 30 tablet 11   losartan (COZAAR) 100 MG tablet Take 1 tablet (100 mg total) by mouth  daily. 90 tablet 3   albuterol (VENTOLIN HFA) 108 (90 Base) MCG/ACT inhaler Inhale 2 puffs into the lungs every 6 (six) hours as needed for wheezing or shortness of breath. 8 g 1   cetirizine (ZYRTEC) 10 MG tablet Take 1 tablet (10 mg total) by mouth daily as needed for allergies. 30 tablet 1   omeprazole (PRILOSEC) 20 MG capsule Take 1 capsule (20 mg total) by mouth 2 (two) times daily before a meal. (Patient taking differently: Take 20 mg by mouth 2 (two) times daily as needed (indigestion).) 28 capsule 3   nicotine (NICODERM CQ - DOSED IN MG/24 HOURS) 21 mg/24hr patch Place 1 patch (21 mg total) onto the skin daily. (Patient not taking: Reported on 05/20/2020) 28 patch 0   Facility-Administered Medications Prior to Visit  Medication Dose Route Frequency Provider Last Rate Last Admin   cloNIDine (CATAPRES) tablet 0.3 mg   0.3 mg Oral Once Barbette Merino, NP        No Known Allergies  ROS Review of Systems    Objective:    Physical Exam HENT:     Head: Normocephalic and atraumatic.  Cardiovascular:     Rate and Rhythm: Normal rate and regular rhythm.     Pulses: Normal pulses.     Heart sounds: Normal heart sounds.  Pulmonary:     Effort: Pulmonary effort is normal.     Breath sounds: Normal breath sounds.  Musculoskeletal:        General: Normal range of motion.     Cervical back: Normal range of motion.     Right lower leg: No edema.     Left lower leg: No edema.  Skin:    General: Skin is warm and dry.     Capillary Refill: Capillary refill takes less than 2 seconds.  Neurological:     General: No focal deficit present.     Mental Status: He is alert and oriented to person, place, and time.  Psychiatric:        Mood and Affect: Mood normal.        Behavior: Behavior normal.        Thought Content: Thought content normal.        Judgment: Judgment normal.   BP (!) 165/114   Pulse 80   Temp 98.1 F (36.7 C)   Ht 5\' 7"  (1.702 m)   Wt 206 lb 0.8 oz (93.5 kg)   SpO2 98%   BMI 32.27 kg/m  Wt Readings from Last 3 Encounters:  08/26/20 206 lb 0.8 oz (93.5 kg)  05/20/20 208 lb (94.3 kg)  03/22/20 202 lb 3.2 oz (91.7 kg)     There are no preventive care reminders to display for this patient.   There are no preventive care reminders to display for this patient.  No results found for: TSH Lab Results  Component Value Date   WBC 9.5 03/22/2020   HGB 14.7 03/22/2020   HCT 42.4 03/22/2020   MCV 85 03/22/2020   PLT 378 03/22/2020   Lab Results  Component Value Date   NA 138 03/22/2020   K 4.9 03/22/2020   CO2 26 03/22/2020   GLUCOSE 84 03/22/2020   BUN 8 03/22/2020   CREATININE 1.16 03/22/2020   BILITOT 1.7 (H) 03/22/2020   ALKPHOS 92 03/22/2020   AST 27 03/22/2020   ALT 20 03/22/2020   PROT 7.4 03/22/2020   ALBUMIN 4.4 03/22/2020   CALCIUM 11.5 (H) 03/22/2020    ANIONGAP  10 12/01/2019   Lab Results  Component Value Date   CHOL  11/06/2008    84        ATP III CLASSIFICATION:  <200     mg/dL   Desirable  409-811200-239  mg/dL   Borderline High  >=914>=240    mg/dL   High          Lab Results  Component Value Date   HDL 31 (L) 11/06/2008   Lab Results  Component Value Date   [facility]  11/06/2008    44        Total Cholesterol/HDL:CHD Risk Coronary Heart Disease Risk Table                     Men   Women  1/2 Average Risk   3.4   3.3  Average Risk       5.0   4.4  2 X Average Risk   9.6   7.1  3 X Average Risk  23.4   11.0        Use the calculated Patient Ratio above and the CHD Risk Table to determine the patient's CHD Risk.        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 Move frequently to reduce venous thromboembolism risk. Avoid situations that could lead to dehydration or could exacerbate pain Discussed S&S of infection, seizures, stroke acute chest, DVT and how important it is to seek medical attention Take medication as directed along with pain contract and overall compliance Discussed the risk related to opiate use (addition, tolerance and dependency)    Relevant Orders   Urinalysis Dipstick (Completed)   Other Visit Diagnoses     Mild intermittent asthma without complication  (Chronic)    Stable   Relevant Medications   albuterol (VENTOLIN HFA) 108 (90 Base) MCG/ACT inhaler   cetirizine (ZYRTEC) 10 MG tablet   Gastroesophageal reflux disease  without esophagitis   Worsening Pantoprazole 40 mg daily patient to follow-up if not effective for increase in frequency to twice daily   Relevant Medications      Hypertensive emergency without congestive heart failure     Persistent impression was informed to go to the emergency department for further treatment clonidine patch prescribed Encouraged on going compliance with current medication regimen Encouraged home monitoring and recording BP <130/80 Eating a heart-healthy diet with less salt Encouraged regular physical activity  Recommend Weight loss     Relevant Medications   cloNIDine (CATAPRES) tablet 0.1 mg (Start on 08/26/2020 11:30 AM)   cloNIDine (CATAPRES - DOSED IN MG/24 HR) 0.2 mg/24hr patch       Meds ordered this encounter  Medications   albuterol (VENTOLIN HFA) 108 (90 Base) MCG/ACT inhaler  Sig: Inhale 2 puffs into the lungs every 6 (six) hours as needed for wheezing or shortness of breath.    Dispense:  8 g    Refill:  1   DISCONTD: omeprazole (PRILOSEC) 20 MG capsule    Sig: Take 1 capsule (20 mg total) by mouth 2 (two) times daily before a meal.    Dispense:  28 capsule    Refill:  3   cetirizine (ZYRTEC) 10 MG tablet    Sig: Take 1 tablet (10 mg total) by mouth daily as needed for allergies.    Dispense:  30 tablet    Refill:  1   cloNIDine (CATAPRES) tablet 0.1 mg   cloNIDine (CATAPRES - DOSED IN MG/24 HR) 0.2 mg/24hr patch    Sig: Place 1 patch (0.2 mg total) onto the skin once a week.    Dispense:  4 patch    Refill:  12    Order Specific Question:   Supervising Provider    Answer:   Quentin Angst [[id number]]   pantoprazole (PROTONIX) 40 MG tablet    Sig: Take 1 tablet (40 mg total) by mouth daily.    Dispense:  30 tablet    Refill:  3    Order Specific Question:   Supervising Provider    Answer:   Quentin Angst [[id number]]   oxyCODONE (ROXICODONE) 15 MG immediate release tablet    Sig: Take 1 tablet (15 mg total) by mouth every 6  (six) hours as needed for up to 15 days for pain.    Dispense:  60 tablet    Refill:  0    Order Specific Question:   Supervising Provider    Answer:   Quentin Angst [id number]    Follow-up: Return in about 2 months (around 10/27/2020) for nurse visit (Wt and BP) 1 week .    Barbette Merino, NP

## 2020-08-26 NOTE — Patient Instructions (Signed)
Sickle Cell Anemia, Adult  Sickle cell anemia is a condition where your red blood cells are shaped like sickles. Red blood cells carry oxygen through the body. Sickle-shaped cells do not live as long as normal red blood cells. They also clump together and block blood from flowing through the blood vessels. This prevents the body from getting enough oxygen. Sickle cell anemia causes organ damage and pain. It alsoincreases the risk of infection. Follow these instructions at home: Medicines Take over-the-counter and prescription medicines only as told by your doctor. If you were prescribed an antibiotic medicine, take it as told by your doctor. Do not stop taking the antibiotic even if you start to feel better. If you develop a fever, do not take medicines to lower the fever right away. Tell your doctor about the fever. Managing pain, stiffness, and swelling Try these methods to help with pain: Use a heating pad. Take a warm bath. Distract yourself, such as by watching TV. Eating and drinking Drink enough fluid to keep your pee (urine) clear or pale yellow. Drink more in hot weather and during exercise. Limit or avoid alcohol. Eat a healthy diet. Eat plenty of fruits, vegetables, whole grains, and lean protein. Take vitamins and supplements as told by your doctor. Traveling When traveling, keep these with you: Your medical information. The names of your doctors. Your medicines. If you need to take an airplane, talk to your doctor first. Activity Rest often. Avoid exercises that make your heart beat much faster, such as jogging. General instructions Do not use products that have nicotine or tobacco, such as cigarettes and e-cigarettes. If you need help quitting, ask your doctor. Consider wearing a medical alert bracelet. Avoid being in high places (high altitudes), such as mountains. Avoid very hot or cold temperatures. Avoid places where the temperature changes a lot. Keep all follow-up  visits as told by your doctor. This is important. Contact a doctor if: A joint hurts. Your feet or hands hurt or swell. You feel tired (fatigued). Get help right away if: You have symptoms of infection. These include: Fever. Chills. Being very tired. Irritability. Poor eating. Throwing up (vomiting). You feel dizzy or faint. You have new stomach pain, especially on the left side. You have a an erection (priapism) that lasts more than 4 hours. You have numbness in your arms or legs. You have a hard time moving your arms or legs. You have trouble talking. You have pain that does not go away when you take medicine. You are short of breath. You are breathing fast. You have a long-term cough. You have pain in your chest. You have a bad headache. You have a stiff neck. Your stomach looks bloated even though you did not eat much. Your skin is pale. You suddenly cannot see well. Summary Sickle cell anemia is a condition where your red blood cells are shaped like sickles. Follow your doctor's advice on ways to manage pain, food to eat, activities to do, and steps to take for safe travel. Get medical help right away if you have any signs of infection, such as a fever. This information is not intended to replace advice given to you by your health care provider. Make sure you discuss any questions you have with your healthcare provider. Document Revised: 06/26/2019 Document Reviewed: 06/26/2019 Elsevier Patient Education  2022 Elsevier Inc. Managing Your Hypertension Hypertension, also called high blood pressure, is when the force of the blood pressing against the walls of the arteries is too  strong. Arteries are blood vessels that carry blood from your heart throughout your body. Hypertension forces the heart to work harder to pump blood and may cause the arteries tobecome narrow or stiff. Understanding blood pressure readings Your personal target blood pressure may vary depending on  your medical conditions, your age, and other factors. A blood pressure reading includes a higher number over a lower number. Ideally, your blood pressure should be below 120/80. You should know that: The first, or top, number is called the systolic pressure. It is a measure of the pressure in your arteries as your heart beats. The second, or bottom number, is called the diastolic pressure. It is a measure of the pressure in your arteries as the heart relaxes. Blood pressure is classified into four stages. Based on your blood pressure reading, your health care provider may use the following stages to determine what type of treatment you need, if any. Systolic pressure and diastolicpressure are measured in a unit called mmHg. Normal Systolic pressure: below 120. Diastolic pressure: below 80. Elevated Systolic pressure: 120-129. Diastolic pressure: below 80. Hypertension stage 1 Systolic pressure: 130-139. Diastolic pressure: 80-89. Hypertension stage 2 Systolic pressure: 140 or above. Diastolic pressure: 90 or above. How can this condition affect me? Managing your hypertension is an important responsibility. Over time, hypertension can damage the arteries and decrease blood flow to important parts of the body, including the brain, heart, and kidneys. Having untreated or uncontrolled hypertension can lead to: A heart attack. A stroke. A weakened blood vessel (aneurysm). Heart failure. Kidney damage. Eye damage. Metabolic syndrome. Memory and concentration problems. Vascular dementia. What actions can I take to manage this condition? Hypertension can be managed by making lifestyle changes and possibly by taking medicines. Your health care provider will help you make a plan to bring yourblood pressure within a normal range. Nutrition  Eat a diet that is high in fiber and potassium, and low in salt (sodium), added sugar, and fat. An example eating plan is called the Dietary Approaches to  Stop Hypertension (DASH) diet. To eat this way: Eat plenty of fresh fruits and vegetables. Try to fill one-half of your plate at each meal with fruits and vegetables. Eat whole grains, such as whole-wheat pasta, brown rice, or whole-grain bread. Fill about one-fourth of your plate with whole grains. Eat low-fat dairy products. Avoid fatty cuts of meat, processed or cured meats, and poultry with skin. Fill about one-fourth of your plate with lean proteins such as fish, chicken without skin, beans, eggs, and tofu. Avoid pre-made and processed foods. These tend to be higher in sodium, added sugar, and fat. Reduce your daily sodium intake. Most people with hypertension should eat less than 1,500 mg of sodium a day.  Lifestyle  Work with your health care provider to maintain a healthy body weight or to lose weight. Ask what an ideal weight is for you. Get at least 30 minutes of exercise that causes your heart to beat faster (aerobic exercise) most days of the week. Activities may include walking, swimming, or biking. Include exercise to strengthen your muscles (resistance exercise), such as weight lifting, as part of your weekly exercise routine. Try to do these types of exercises for 30 minutes at least 3 days a week. Do not use any products that contain nicotine or tobacco, such as cigarettes, e-cigarettes, and chewing tobacco. If you need help quitting, ask your health care provider. Control any long-term (chronic) conditions you have, such as high cholesterol or diabetes.  Identify your sources of stress and find ways to manage stress. This may include meditation, deep breathing, or making time for fun activities.  Alcohol use Do not drink alcohol if: Your health care provider tells you not to drink. You are pregnant, may be pregnant, or are planning to become pregnant. If you drink alcohol: Limit how much you use to: 0-1 drink a day for women. 0-2 drinks a day for men. Be aware of how much  alcohol is in your drink. In the [place] one drink equals one 12 oz bottle of beer (355 mL), one 5 oz glass of wine (148 mL), or one 1 oz glass of hard liquor (44 mL). Medicines Your health care provider may prescribe medicine if lifestyle changes are not enough to get your blood pressure under control and if: Your systolic blood pressure is 130 or higher. Your diastolic blood pressure is 80 or higher. Take medicines only as told by your health care provider. Follow the directions carefully. Blood pressure medicines must be taken as told by your health care provider. The medicine does not work as well when you skip doses. Skippingdoses also puts you at risk for problems. Monitoring Before you monitor your blood pressure: Do not smoke, drink caffeinated beverages, or exercise within 30 minutes before taking a measurement. Use the bathroom and empty your bladder (urinate). Sit quietly for at least 5 minutes before taking measurements. Monitor your blood pressure at home as told by your health care provider. To do this: Sit with your back straight and supported. Place your feet flat on the floor. Do not cross your legs. Support your arm on a flat surface, such as a table. Make sure your upper arm is at heart level. Each time you measure, take two or three readings one minute apart and record the results. You may also need to have your blood pressure checked regularly by your healthcare provider. General information Talk with your health care provider about your diet, exercise habits, and other lifestyle factors that may be contributing to hypertension. Review all the medicines you take with your health care provider because there may be side effects or interactions. Keep all visits as told by your health care provider. Your health care provider can help you create and adjust your plan for managing your high blood pressure. Where to find more information National Heart, Lung, and Blood Institute:  PopSteam.is American Heart Association: www.heart.org Contact a health care provider if: You think you are having a reaction to medicines you have taken. You have repeated (recurrent) headaches. You feel dizzy. You have swelling in your ankles. You have trouble with your vision. Get help right away if: You develop a severe headache or confusion. You have unusual weakness or numbness, or you feel faint. You have severe pain in your chest or abdomen. You vomit repeatedly. You have trouble breathing. These symptoms may represent a serious problem that is an emergency. Do not wait to see if the symptoms will go away. Get medical help right away. Call your local emergency services (911 in the [place] Do not drive yourself to the hospital. Summary Hypertension is when the force of blood pumping through your arteries is too strong. If this condition is not controlled, it may put you at risk for serious complications. Your personal target blood pressure may vary depending on your medical conditions, your age, and other factors. For most people, a normal blood pressure is less than 120/80. Hypertension is managed by lifestyle changes, medicines, or  both. Lifestyle changes to help manage hypertension include losing weight, eating a healthy, low-sodium diet, exercising more, stopping smoking, and limiting alcohol. This information is not intended to replace advice given to you by your health care provider. Make sure you discuss any questions you have with your healthcare provider. Document Revised: 03/07/2019 Document Reviewed: 12/31/2018 Elsevier Patient Education  2022 [employer].

## 2020-08-27 ENCOUNTER — Encounter: Payer: Self-pay | Admitting: Nurse Practitioner

## 2020-08-27 LAB — CMP14+CBC/D/PLT+FER+RETIC+V...
ALT: 18 IU/L (ref 0–44)
AST: 24 IU/L (ref 0–40)
Albumin/Globulin Ratio: 1.7 (ref 1.2–2.2)
Albumin: 4.8 g/dL (ref 4.0–5.0)
Alkaline Phosphatase: 95 IU/L (ref 44–121)
BUN/Creatinine Ratio: 8 — ABNORMAL LOW (ref 9–20)
BUN: 9 mg/dL (ref 6–24)
Basophils Absolute: 0.1 10*3/uL (ref 0.0–0.2)
Basos: 1 %
Bilirubin Total: 1.8 mg/dL — ABNORMAL HIGH (ref 0.0–1.2)
CO2: 21 mmol/L (ref 20–29)
Calcium: 10.7 mg/dL — ABNORMAL HIGH (ref 8.7–10.2)
Chloride: 100 mmol/L (ref 96–106)
Creatinine, Ser: 1.12 mg/dL (ref 0.76–1.27)
EOS (ABSOLUTE): 0.3 10*3/uL (ref 0.0–0.4)
Eos: 3 %
Ferritin: 101 ng/mL (ref 30–400)
Globulin, Total: 2.8 g/dL (ref 1.5–4.5)
Glucose: 84 mg/dL (ref 65–99)
Hematocrit: 43.2 % (ref 37.5–51.0)
Hemoglobin: 15.1 g/dL (ref 13.0–17.7)
Immature Grans (Abs): 0.1 10*3/uL (ref 0.0–0.1)
Immature Granulocytes: 1 %
Lymphocytes Absolute: 3.1 10*3/uL (ref 0.7–3.1)
Lymphs: 31 %
MCH: 30 pg (ref 26.6–33.0)
MCHC: 35 g/dL (ref 31.5–35.7)
MCV: 86 fL (ref 79–97)
Monocytes Absolute: 1 10*3/uL — ABNORMAL HIGH (ref 0.1–0.9)
Monocytes: 10 %
NRBC: 2 % — ABNORMAL HIGH (ref 0–0)
Neutrophils Absolute: 5.5 10*3/uL (ref 1.4–7.0)
Neutrophils: 54 %
Platelets: 338 10*3/uL (ref 150–450)
Potassium: 4.7 mmol/L (ref 3.5–5.2)
RBC: 5.03 x10E6/uL (ref 4.14–5.80)
RDW: 16.1 % — ABNORMAL HIGH (ref 11.6–15.4)
Retic Ct Pct: 4.1 % — ABNORMAL HIGH (ref 0.6–2.6)
Sodium: 139 mmol/L (ref 134–144)
Total Protein: 7.6 g/dL (ref 6.0–8.5)
Vit D, 25-Hydroxy: 20.8 ng/mL — ABNORMAL LOW (ref 30.0–100.0)
WBC: 10.1 10*3/uL (ref 3.4–10.8)
eGFR: 83 mL/min/{1.73_m2} (ref >59)

## 2020-08-30 ENCOUNTER — Telehealth: Payer: Self-pay | Admitting: Nurse Practitioner

## 2020-08-30 ENCOUNTER — Other Ambulatory Visit: Payer: Self-pay | Admitting: Nurse Practitioner

## 2020-08-30 DIAGNOSIS — I10 Essential (primary) hypertension: Secondary | ICD-10-CM

## 2020-08-30 NOTE — Telephone Encounter (Signed)
08/30/20: Called pharmacy - tech stated that the approval was for brand name med and she was going to order it for the patient .

## 2020-08-30 NOTE — Telephone Encounter (Signed)
Pt states Pharmacy stated his cloNIDine (CATAPRES - DOSED IN MG/24 HR) 0.2 mg/24hr patch [[id number]]  Was not approved and pt needs it. Pt requesting help to obtain his Clonidine asap.   Pharmacy  [id number] [place], [place] - [address] AT [facility] [address]  [address], [place] [address]  Phone:  [phone]  Fax:  [phone]

## 2020-09-02 ENCOUNTER — Ambulatory Visit: Payer: Self-pay

## 2020-09-13 ENCOUNTER — Telehealth: Payer: Self-pay

## 2020-09-13 NOTE — Telephone Encounter (Signed)
Oxycodone  °

## 2020-09-14 ENCOUNTER — Other Ambulatory Visit: Payer: Self-pay | Admitting: Nurse Practitioner

## 2020-09-14 DIAGNOSIS — D5720 Sickle-cell/Hb-C disease without crisis: Secondary | ICD-10-CM

## 2020-09-14 DIAGNOSIS — Z79891 Long term (current) use of opiate analgesic: Secondary | ICD-10-CM

## 2020-09-14 MED ORDER — OXYCODONE HCL 15 MG PO TABS: 15.0000 mg | ORAL_TABLET | Freq: Four times a day (QID) | ORAL | 0 refills | Status: DC | PRN

## 2020-09-17 ENCOUNTER — Other Ambulatory Visit: Payer: Self-pay | Admitting: Nurse Practitioner

## 2020-09-17 DIAGNOSIS — J4520 Mild intermittent asthma, uncomplicated: Secondary | ICD-10-CM

## 2020-09-28 ENCOUNTER — Telehealth: Payer: Self-pay

## 2020-09-28 NOTE — Telephone Encounter (Signed)
Oxycodone  °

## 2020-09-29 ENCOUNTER — Other Ambulatory Visit: Payer: Self-pay | Admitting: Nurse Practitioner

## 2020-09-29 DIAGNOSIS — D5720 Sickle-cell/Hb-C disease without crisis: Secondary | ICD-10-CM

## 2020-09-29 DIAGNOSIS — Z79891 Long term (current) use of opiate analgesic: Secondary | ICD-10-CM

## 2020-09-29 MED ORDER — OXYCODONE HCL 15 MG PO TABS: 15.0000 mg | ORAL_TABLET | Freq: Four times a day (QID) | ORAL | 0 refills | Status: AC | PRN

## 2020-09-30 ENCOUNTER — Other Ambulatory Visit: Payer: Self-pay | Admitting: Nurse Practitioner

## 2020-09-30 ENCOUNTER — Telehealth: Payer: Self-pay

## 2020-09-30 DIAGNOSIS — J4520 Mild intermittent asthma, uncomplicated: Secondary | ICD-10-CM

## 2020-09-30 NOTE — Telephone Encounter (Signed)
Prior Approval #:[id number] Status:APPROVED  Effective Begin Date:09/30/2020 Effective End Date:03/29/2021

## 2020-09-30 NOTE — Telephone Encounter (Signed)
Prior Auth initiated via [employer] for Oxycodone Confirmation #:[id number] W

## 2020-10-19 ENCOUNTER — Telehealth: Payer: Self-pay

## 2020-10-19 ENCOUNTER — Other Ambulatory Visit: Payer: Self-pay | Admitting: Family Medicine

## 2020-10-19 DIAGNOSIS — G894 Chronic pain syndrome: Secondary | ICD-10-CM

## 2020-10-19 MED ORDER — OXYCODONE HCL 15 MG PO TABS: 15.0000 mg | ORAL_TABLET | ORAL | 0 refills | Status: DC | PRN

## 2020-10-19 NOTE — Telephone Encounter (Signed)
Error

## 2020-10-19 NOTE — Telephone Encounter (Signed)
Oxycodone  °

## 2020-10-19 NOTE — Progress Notes (Signed)
Reviewed PDMP substance reporting system prior to prescribing opiate medications. No inconsistencies noted.  °Meds ordered this encounter  °Medications  ° oxyCODONE (ROXICODONE) 15 MG immediate release tablet  °  Sig: Take 1 tablet (15 mg total) by mouth every 4 (four) hours as needed for pain.  °  Dispense:  60 tablet  °  Refill:  0  °  Order Specific Question:   Supervising Provider  °  Answer:   JEGEDE, OLUGBEMIGA E [[id number]]  ° Ofilia Rayon Moore Akili Cuda  APRN, MSN, FNP-C °Patient Care Center °[facility] °[address]  °[place], [place] [address] °[phone] ° °

## 2020-10-20 ENCOUNTER — Other Ambulatory Visit: Payer: Self-pay | Admitting: Nurse Practitioner

## 2020-10-20 DIAGNOSIS — G894 Chronic pain syndrome: Secondary | ICD-10-CM

## 2020-10-28 ENCOUNTER — Ambulatory Visit: Payer: Self-pay | Admitting: Nurse Practitioner

## 2020-10-29 ENCOUNTER — Ambulatory Visit: Payer: Self-pay | Admitting: Nurse Practitioner

## 2020-11-03 ENCOUNTER — Telehealth: Payer: Self-pay

## 2020-11-03 NOTE — Telephone Encounter (Signed)
Oxycodone  °

## 2020-11-04 ENCOUNTER — Other Ambulatory Visit: Payer: Self-pay | Admitting: Nurse Practitioner

## 2020-11-04 DIAGNOSIS — G894 Chronic pain syndrome: Secondary | ICD-10-CM

## 2020-11-04 MED ORDER — OXYCODONE HCL 15 MG PO TABS: 15.0000 mg | ORAL_TABLET | ORAL | 0 refills | Status: DC | PRN

## 2020-11-11 ENCOUNTER — Ambulatory Visit: Payer: Self-pay | Admitting: Nurse Practitioner

## 2020-11-19 ENCOUNTER — Ambulatory Visit: Payer: Medicaid Other | Admitting: Nurse Practitioner

## 2020-11-19 ENCOUNTER — Other Ambulatory Visit: Payer: Self-pay | Admitting: Nurse Practitioner

## 2020-11-19 ENCOUNTER — Telehealth: Payer: Self-pay

## 2020-11-19 ENCOUNTER — Other Ambulatory Visit: Payer: Self-pay

## 2020-11-19 VITALS — BP 156/101 | HR 83

## 2020-11-19 DIAGNOSIS — G894 Chronic pain syndrome: Secondary | ICD-10-CM

## 2020-11-19 DIAGNOSIS — I10 Essential (primary) hypertension: Secondary | ICD-10-CM

## 2020-11-19 MED ORDER — CLONIDINE HCL 0.3 MG/24HR TD PTWK: 0.3000 mg | MEDICATED_PATCH | TRANSDERMAL | 11 refills | Status: DC

## 2020-11-19 MED ORDER — OXYCODONE HCL 15 MG PO TABS: 15.0000 mg | ORAL_TABLET | ORAL | 0 refills | Status: DC | PRN

## 2020-11-19 MED ORDER — OMRON 3 SERIES BP MONITOR DEVI: 1.0000 | Freq: Every day | 0 refills | Status: DC

## 2020-11-19 NOTE — Progress Notes (Signed)
Patient in for blood pressure check.  

## 2020-11-19 NOTE — Progress Notes (Signed)
   [facility] [address] [place], [place]  [address] Phone:  [phone]   Fax:  [phone] 

## 2020-11-19 NOTE — Telephone Encounter (Signed)
Oxycodone  °

## 2020-12-02 ENCOUNTER — Ambulatory Visit: Payer: Self-pay | Admitting: Nurse Practitioner

## 2020-12-03 ENCOUNTER — Other Ambulatory Visit: Payer: Self-pay | Admitting: Nurse Practitioner

## 2020-12-03 ENCOUNTER — Other Ambulatory Visit: Payer: Self-pay

## 2020-12-03 ENCOUNTER — Telehealth: Payer: Self-pay

## 2020-12-03 DIAGNOSIS — G894 Chronic pain syndrome: Secondary | ICD-10-CM

## 2020-12-03 MED ORDER — OXYCODONE HCL 15 MG PO TABS: 15.0000 mg | ORAL_TABLET | ORAL | 0 refills | Status: DC | PRN

## 2020-12-03 NOTE — Telephone Encounter (Signed)
Oxycodone  °

## 2020-12-16 ENCOUNTER — Other Ambulatory Visit: Payer: Self-pay

## 2020-12-16 ENCOUNTER — Other Ambulatory Visit: Payer: Self-pay | Admitting: Nurse Practitioner

## 2020-12-16 ENCOUNTER — Telehealth: Payer: Self-pay

## 2020-12-16 DIAGNOSIS — G894 Chronic pain syndrome: Secondary | ICD-10-CM

## 2020-12-16 DIAGNOSIS — I10 Essential (primary) hypertension: Secondary | ICD-10-CM

## 2020-12-16 MED ORDER — OXYCODONE HCL 15 MG PO TABS: 15.0000 mg | ORAL_TABLET | ORAL | 0 refills | Status: DC | PRN

## 2020-12-16 MED ORDER — AMLODIPINE BESYLATE 10 MG PO TABS
10.0000 mg | ORAL_TABLET | Freq: Every day | ORAL | 3 refills | Status: DC
Start: 2020-12-16 — End: 2021-06-15

## 2020-12-16 MED ORDER — LOSARTAN POTASSIUM 100 MG PO TABS: 100.0000 mg | ORAL_TABLET | Freq: Every day | ORAL | 3 refills | Status: DC

## 2020-12-16 NOTE — Telephone Encounter (Signed)
Patient request refill on [facility] [address]

## 2020-12-24 ENCOUNTER — Ambulatory Visit: Payer: Self-pay | Admitting: Nurse Practitioner

## 2020-12-31 ENCOUNTER — Telehealth: Payer: Self-pay

## 2020-12-31 NOTE — Telephone Encounter (Signed)
Oxycodone  °

## 2021-01-03 ENCOUNTER — Other Ambulatory Visit: Payer: Self-pay | Admitting: Nurse Practitioner

## 2021-01-03 ENCOUNTER — Other Ambulatory Visit: Payer: Self-pay

## 2021-01-03 ENCOUNTER — Other Ambulatory Visit: Payer: Medicaid Other

## 2021-01-03 ENCOUNTER — Telehealth: Payer: Self-pay

## 2021-01-03 DIAGNOSIS — Z79891 Long term (current) use of opiate analgesic: Secondary | ICD-10-CM

## 2021-01-03 DIAGNOSIS — D5720 Sickle-cell/Hb-C disease without crisis: Secondary | ICD-10-CM

## 2021-01-03 DIAGNOSIS — G894 Chronic pain syndrome: Secondary | ICD-10-CM

## 2021-01-03 NOTE — Telephone Encounter (Signed)
Patient notified stated he was on the way.

## 2021-01-03 NOTE — Telephone Encounter (Signed)
Oxycodone to be refilled   It's DUE today  [facility]

## 2021-01-04 LAB — CMP14+CBC/D/PLT+FER+RETIC+V...
ALT: 20 IU/L (ref 0–44)
AST: 24 IU/L (ref 0–40)
Albumin/Globulin Ratio: 1.8 (ref 1.2–2.2)
Albumin: 4.8 g/dL (ref 4.0–5.0)
Alkaline Phosphatase: 102 IU/L (ref 44–121)
BUN/Creatinine Ratio: 8 — ABNORMAL LOW (ref 9–20)
BUN: 9 mg/dL (ref 6–24)
Basophils Absolute: 0.1 10*3/uL (ref 0.0–0.2)
Basos: 1 %
Bilirubin Total: 1.9 mg/dL — ABNORMAL HIGH (ref 0.0–1.2)
CO2: 23 mmol/L (ref 20–29)
Calcium: 11.2 mg/dL — ABNORMAL HIGH (ref 8.7–10.2)
Chloride: 101 mmol/L (ref 96–106)
Creatinine, Ser: 1.2 mg/dL (ref 0.76–1.27)
EOS (ABSOLUTE): 0.3 10*3/uL (ref 0.0–0.4)
Eos: 3 %
Ferritin: 149 ng/mL (ref 30–400)
Globulin, Total: 2.7 g/dL (ref 1.5–4.5)
Glucose: 112 mg/dL — ABNORMAL HIGH (ref 70–99)
Hematocrit: 45.5 % (ref 37.5–51.0)
Hemoglobin: 15.8 g/dL (ref 13.0–17.7)
Immature Grans (Abs): 0 10*3/uL (ref 0.0–0.1)
Immature Granulocytes: 0 %
Lymphocytes Absolute: 4 10*3/uL — ABNORMAL HIGH (ref 0.7–3.1)
Lymphs: 38 %
MCH: 29.7 pg (ref 26.6–33.0)
MCHC: 34.7 g/dL (ref 31.5–35.7)
MCV: 86 fL (ref 79–97)
Monocytes Absolute: 1.3 10*3/uL — ABNORMAL HIGH (ref 0.1–0.9)
Monocytes: 12 %
NRBC: 2 % — ABNORMAL HIGH (ref 0–0)
Neutrophils Absolute: 4.9 10*3/uL (ref 1.4–7.0)
Neutrophils: 46 %
Platelets: 359 10*3/uL (ref 150–450)
Potassium: 4.1 mmol/L (ref 3.5–5.2)
RBC: 5.32 x10E6/uL (ref 4.14–5.80)
RDW: 15.7 % — ABNORMAL HIGH (ref 11.6–15.4)
Retic Ct Pct: 4.4 % — ABNORMAL HIGH (ref 0.6–2.6)
Sodium: 138 mmol/L (ref 134–144)
Total Protein: 7.5 g/dL (ref 6.0–8.5)
Vit D, 25-Hydroxy: 18.7 ng/mL — ABNORMAL LOW (ref 30.0–100.0)
WBC: 10.6 10*3/uL (ref 3.4–10.8)
eGFR: 76 mL/min/{1.73_m2} (ref >59)

## 2021-01-05 ENCOUNTER — Telehealth: Payer: Self-pay

## 2021-01-05 ENCOUNTER — Other Ambulatory Visit: Payer: Self-pay | Admitting: Nurse Practitioner

## 2021-01-05 DIAGNOSIS — G894 Chronic pain syndrome: Secondary | ICD-10-CM

## 2021-01-05 LAB — DRUG SCREEN 764883 11+OXYCO+ALC+CRT-BUND
Amphetamines, Urine: NEGATIVE ng/mL
BENZODIAZ UR QL: NEGATIVE ng/mL
Barbiturate: NEGATIVE ng/mL
Cannabinoid Quant, Ur: NEGATIVE ng/mL
Cocaine (Metabolite): NEGATIVE ng/mL
Creatinine: 71.8 mg/dL (ref 20.0–300.0)
Ethanol: NEGATIVE %
Meperidine: NEGATIVE ng/mL
Methadone Screen, Urine: NEGATIVE ng/mL
OPIATE SCREEN URINE: NEGATIVE ng/mL
Oxycodone/Oxymorphone, Urine: NEGATIVE ng/mL
Phencyclidine: NEGATIVE ng/mL
Propoxyphene: NEGATIVE ng/mL
Tramadol: NEGATIVE ng/mL
pH, Urine: 7.4 (ref 4.5–8.9)

## 2021-01-05 MED ORDER — OXYCODONE HCL 15 MG PO TABS: 15.0000 mg | ORAL_TABLET | ORAL | 0 refills | Status: DC | PRN

## 2021-01-05 NOTE — Telephone Encounter (Signed)
Confirmation #:[id number] WBenefit Plan:MCAIDHealth Plan:[id number] Prior Approval #:[id number] [place] Type:PHARMACY  Recipient:Dean R FLIPPINRecipient [id number] KBilling Provider:Billing Provider HQ:[id number] Provider Name:CRYSTAL [facility] Provider ZL:[id number] Submission Date:01/05/2021  Status:APPROVED Effective Begin Date:01/05/2021 Effective End Date:07/04/2021

## 2021-01-13 ENCOUNTER — Ambulatory Visit: Payer: Self-pay | Admitting: Nurse Practitioner

## 2021-01-17 ENCOUNTER — Encounter: Payer: Self-pay | Admitting: Nurse Practitioner

## 2021-01-17 ENCOUNTER — Other Ambulatory Visit: Payer: Self-pay

## 2021-01-17 ENCOUNTER — Ambulatory Visit (INDEPENDENT_AMBULATORY_CARE_PROVIDER_SITE_OTHER): Payer: Medicaid Other | Admitting: Nurse Practitioner

## 2021-01-17 VITALS — BP 142/101 | HR 80 | Temp 97.5°F | Ht 67.0 in | Wt 210.6 lb

## 2021-01-17 DIAGNOSIS — Z23 Encounter for immunization: Secondary | ICD-10-CM

## 2021-01-17 DIAGNOSIS — G894 Chronic pain syndrome: Secondary | ICD-10-CM | POA: Diagnosis not present

## 2021-01-17 DIAGNOSIS — D5720 Sickle-cell/Hb-C disease without crisis: Secondary | ICD-10-CM

## 2021-01-17 DIAGNOSIS — M79641 Pain in right hand: Secondary | ICD-10-CM

## 2021-01-17 DIAGNOSIS — D57219 Sickle-cell/Hb-C disease with crisis, unspecified: Secondary | ICD-10-CM | POA: Insufficient documentation

## 2021-01-17 DIAGNOSIS — I10 Essential (primary) hypertension: Secondary | ICD-10-CM | POA: Diagnosis not present

## 2021-01-17 LAB — POCT URINALYSIS DIP (CLINITEK)
Bilirubin, UA: NEGATIVE
Blood, UA: NEGATIVE
Glucose, UA: NEGATIVE mg/dL
Ketones, POC UA: NEGATIVE mg/dL
Leukocytes, UA: NEGATIVE
Nitrite, UA: NEGATIVE
POC PROTEIN,UA: NEGATIVE
Spec Grav, UA: 1.015 (ref 1.010–1.025)
Urobilinogen, UA: 0.2 E.U./dL
pH, UA: 6 (ref 5.0–8.0)

## 2021-01-17 MED ORDER — OXYCODONE HCL 15 MG PO TABS: 15.0000 mg | ORAL_TABLET | ORAL | 0 refills | Status: DC | PRN

## 2021-01-17 MED ORDER — HYDRALAZINE HCL 25 MG PO TABS: 25.0000 mg | ORAL_TABLET | Freq: Three times a day (TID) | ORAL | 11 refills | Status: DC

## 2021-01-17 NOTE — Progress Notes (Signed)
Dean Webb, [place]  [address] Phone:  [phone]   Fax:  [phone]   Established Patient Office Visit  Subjective:  Patient ID: Dean Webb, male    DOB: 03-17-1975  Age: 45 y.o. MRN: [id number]  CC:  Chief Complaint  Patient presents with   Follow-up    Pt is here today for his follow up visit. Pt has been having pains in both hands x 1 week.    HPI Dean Webb presents for follow up. 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 ,  He is in today for follow-up for hypertension.  He has missed several appointments after being started on clonidine.  He has not been monitoring his blood pressure at home.  Based on the current BP readings blood pressure remains uncontrolled.  He does continue to smoke.  He does continue to eat red meats etc.  Denies headache, dizziness, visual changes, shortness of breath, dyspnea on exertion, chest pain, nausea, vomiting or any edema.   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   HTN (hypertension) 2008   Hypertension    Sickle cell anemia (H[facility])    Sickle cell disease ([place])     Past Surgical History:  Procedure Laterality Date   CHOLECYSTECTOMY      Family History  Problem Relation Age of Onset   Sickle cell trait Mother    Hypertension Mother    Asthma Mother    Sickle cell trait Father    Congestive Heart Failure Father    Hypertension Sister    Sickle cell anemia Daughter    Hypertension Daughter    Diabetes Daughter     Social History   Socioeconomic History   Marital status: Single    Spouse name: Not on file   Number of children: Not on file   Years of education: Not on file   Highest education level: Not on file   Occupational History   Not on file  Tobacco Use   Smoking status: Every Day    Packs/day: 0.50    Years: 17.00    Pack years: 8.50    Types: Cigarettes   Smokeless tobacco: Never  Vaping Use   Vaping Use: Never used  Substance and Sexual Activity   Alcohol use: Yes    Comment: Occasional drinker   Drug use: Not Currently    Types: Marijuana   Sexual activity: Yes  Other Topics Concern   Not on file  Social History Narrative   Not on file   Social Determinants of Health   Financial Resource Strain: Not on file  Food Insecurity: Not on file  Transportation Needs: Not on file  Physical Activity: Not on file  Stress: Not on file  Social Connections: Not on file  Intimate Partner Violence: Not on file    Outpatient Medications Prior to Visit  Medication Sig Dispense Refill   albuterol (VENTOLIN HFA) 108 (90 Base) MCG/ACT inhaler Inhale 2 puffs into the lungs every 6 (six) hours as needed for wheezing or shortness of breath. 8 g 1   amLODipine (NORVASC) 10 MG tablet Take 1 tablet (10 mg total) by mouth daily. 90 tablet 3   Blood Pressure Monitoring (OMRON 3 SERIES BP  MONITOR) DEVI 1 kit by Does not apply route daily. 1 each 0   cetirizine (ZYRTEC) 10 MG tablet TAKE 1 TABLET BY MOUTH EVERY DAY AS NEEDED FOR ALLERGY 90 tablet 1   folic acid (FOLVITE) 1 MG tablet Take 1 tablet (1 mg total) by mouth daily. 30 tablet 11   ibuprofen (ADVIL) 800 MG tablet Take 1 tablet (800 mg total) by mouth every 8 (eight) hours as needed. 30 tablet 11   losartan (COZAAR) 100 MG tablet Take 1 tablet (100 mg total) by mouth daily. 90 tablet 3   pantoprazole (PROTONIX) 40 MG tablet Take 1 tablet (40 mg total) by mouth daily. 30 tablet 3   cloNIDine (CATAPRES - DOSED IN MG/24 HR) 0.2 mg/24hr patch Place 1 patch (0.2 mg total) onto the skin once a week. 4 patch 12   cloNIDine (CATAPRES - DOSED IN MG/24 HR) 0.3 mg/24hr patch Place 1 patch (0.3 mg total) onto the skin every 7 (seven) days. 4 patch 11    oxyCODONE (ROXICODONE) 15 MG immediate release tablet Take 1 tablet (15 mg total) by mouth every 4 (four) hours as needed for pain. 60 tablet 0   Facility-Administered Medications Prior to Visit  Medication Dose Route Frequency Provider Last Rate Last Admin   cloNIDine (CATAPRES) tablet 0.3 mg  0.3 mg Oral Once Vevelyn Francois, NP        No Known Allergies  ROS Review of Systems    Objective:    Physical Exam HENT:     Head: Normocephalic and atraumatic.     Nose: Nose normal.     Mouth/Throat:     Mouth: Mucous membranes are moist.  Cardiovascular:     Rate and Rhythm: Normal rate and regular rhythm.     Pulses: Normal pulses.     Heart sounds: Normal heart sounds.  Pulmonary:     Effort: Pulmonary effort is normal.     Breath sounds: Normal breath sounds.  Musculoskeletal:        General: Normal range of motion.     Right lower leg: No edema.     Left lower leg: No edema.  Skin:    General: Skin is warm and dry.     Capillary Refill: Capillary refill takes less than 2 seconds.  Neurological:     General: No focal deficit present.     Mental Status: He is alert and oriented to person, place, and time.  Psychiatric:        Mood and Affect: Mood normal.        Behavior: Behavior normal.        Thought Content: Thought content normal.        Judgment: Judgment normal.   BP (!) 142/101 Comment: manually  Pulse 80   Temp (!) 97.5 F (36.4 C)   Ht _0  (1.702 m)   Wt 210 lb 9.6 oz (95.5 kg)   SpO2 99%   BMI 32.98 kg/m  Wt Readings from Last 3 Encounters:  01/17/21 210 lb 9.6 oz (95.5 kg)  08/26/20 206 lb 0.8 oz (93.5 kg)  05/20/20 208 lb (94.3 kg)     There are no preventive care reminders to display for this patient.   There are no preventive care reminders to display for this patient.  No results found for: TSH Lab Results  Component Value Date   WBC 10.6 01/03/2021   HGB 15.8 01/03/2021   HCT 45.5 01/03/2021   MCV 86 01/03/2021  PLT 359  01/03/2021   Lab Results  Component Value Date   NA 138 01/03/2021   K 4.1 01/03/2021   CO2 23 01/03/2021   GLUCOSE 112 (H) 01/03/2021   BUN 9 01/03/2021   CREATININE 1.20 01/03/2021   BILITOT 1.9 (H) 01/03/2021   ALKPHOS 102 01/03/2021   AST 24 01/03/2021   ALT 20 01/03/2021   PROT 7.5 01/03/2021   ALBUMIN 4.8 01/03/2021   CALCIUM 11.2 (H) 01/03/2021   ANIONGAP 10 12/01/2019   EGFR 76 01/03/2021   Lab Results  Component Value Date   CHOL  11/06/2008    84        ATP III CLASSIFICATION:  <200     mg/dL   Desirable  200-239  mg/dL   Borderline High  >=240    mg/dL   High          Lab Results  Component Value Date   HDL 31 (L) 11/06/2008   Lab Results  Component Value Date   [facility]  11/06/2008    44        Total Cholesterol/HDL:CHD Risk Coronary Heart Disease Risk Table                     Men   Women  1/2 Average Risk   3.4   3.3  Average Risk       5.0   4.4  2 X Average Risk   9.6   7.1  3 X Average Risk  23.4   11.0        Use the calculated Patient Ratio above and the CHD Risk Table to determine the patient's CHD Risk.        ATP III CLASSIFICATION (LDL):  <100     mg/dL   Optimal  100-129  mg/dL   Near or Above                    Optimal  130-159  mg/dL   Borderline  160-189  mg/dL   High  >190     mg/dL   Very High   Lab Results  Component Value Date   TRIG 44 11/06/2008   Lab Results  Component Value Date   CHOLHDL 2.7 11/06/2008   No results found for: HGBA1C    Assessment & Plan:   Problem List Items Addressed This Visit       Cardiovascular and Mediastinum   Essential hypertension (Chronic) Uncontrolled Started hydroxyzine 25 mg 3 times daily Discontinue Clonidine  Encourage smoking cessation Encouraged on going compliance with current medication regimen Encouraged home monitoring and recording BP <130/80 Eating a heart-healthy diet with less salt Encouraged regular physical activity  Recommend Weight loss     Relevant  Medications   hydrALAZINE (APRESOLINE) 25 MG tablet     Other   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 Move frequently to reduce venous thromboembolism risk. Avoid situations that could lead to dehydration or could exacerbate pain Discussed S&S of infection, seizures, stroke acute chest, DVT and how important it is to seek medical attention Take medication as directed along with pain contract and overall compliance Discussed the risk related to opiate use (addition, tolerance and dependency)    Relevant Orders   POCT URINALYSIS DIP (CLINITEK)   Chronic pain syndrome   Relevant Medications   oxyCODONE (ROXICODONE) 15 MG immediate release tablet (Start on 01/20/2021)   Other Visit Diagnoses     Needs  flu shot     Relevant Orders   Flu Vaccine QUAD 67moIM (Fluarix, Fluzone & Alfiuria Quad PF)   Pain of right hand       Relevant Orders   Arthritis Panel       Meds ordered this encounter  Medications   hydrALAZINE (APRESOLINE) 25 MG tablet    Sig: Take 1 tablet (25 mg total) by mouth 3 (three) times daily.    Dispense:  90 tablet    Refill:  11    Order Specific Question:   Supervising Provider    Answer:   JTresa Garter[[[id number]]  oxyCODONE (ROXICODONE) 15 MG immediate release tablet    Sig: Take 1 tablet (15 mg total) by mouth every 4 (four) hours as needed for up to 15 days for pain.    Dispense:  60 tablet    Refill:  0    The refill date is 01/20/2021. Thank you.    Order Specific Question:   Supervising Provider    Answer:   JTresa Garter[[[id number]   Follow-up: Return in about 5 weeks (around 02/21/2021) for Follow up HTN [id number]    CVevelyn Francois NP

## 2021-01-17 NOTE — Patient Instructions (Signed)

## 2021-01-18 LAB — ARTHRITIS PANEL
Anti Nuclear Antibody (ANA): NEGATIVE
Rheumatoid fact SerPl-aCnc: <10 IU/mL (ref <14.0)
Sed Rate: 2 mm/hr (ref 0–15)
Uric Acid: 5.7 mg/dL (ref 3.8–8.4)

## 2021-01-21 ENCOUNTER — Telehealth: Payer: Self-pay

## 2021-01-21 NOTE — Telephone Encounter (Signed)
Prior authorization initiated via Covermymeds for Oxycodone. Key: [id number]

## 2021-02-01 ENCOUNTER — Telehealth: Payer: Self-pay

## 2021-02-01 NOTE — Telephone Encounter (Signed)
Oxycodone  °

## 2021-02-02 ENCOUNTER — Other Ambulatory Visit: Payer: Self-pay | Admitting: Nurse Practitioner

## 2021-02-02 DIAGNOSIS — G894 Chronic pain syndrome: Secondary | ICD-10-CM

## 2021-02-02 MED ORDER — OXYCODONE HCL 15 MG PO TABS: 15.0000 mg | ORAL_TABLET | ORAL | 0 refills | Status: DC | PRN

## 2021-02-18 ENCOUNTER — Other Ambulatory Visit: Payer: Self-pay | Admitting: Nurse Practitioner

## 2021-02-18 ENCOUNTER — Telehealth: Payer: Self-pay

## 2021-02-18 DIAGNOSIS — G894 Chronic pain syndrome: Secondary | ICD-10-CM

## 2021-02-18 MED ORDER — OXYCODONE HCL 15 MG PO TABS: 15.0000 mg | ORAL_TABLET | ORAL | 0 refills | Status: DC | PRN

## 2021-02-18 NOTE — Telephone Encounter (Signed)
The refill has been sent. Thanks  ° °

## 2021-02-18 NOTE — Telephone Encounter (Signed)
Oxycodone  °

## 2021-02-21 ENCOUNTER — Ambulatory Visit: Payer: Self-pay | Admitting: Nurse Practitioner

## 2021-03-04 ENCOUNTER — Other Ambulatory Visit: Payer: Self-pay

## 2021-03-04 ENCOUNTER — Encounter: Payer: Self-pay | Admitting: Nurse Practitioner

## 2021-03-04 ENCOUNTER — Ambulatory Visit (INDEPENDENT_AMBULATORY_CARE_PROVIDER_SITE_OTHER): Payer: Medicaid Other | Admitting: Nurse Practitioner

## 2021-03-04 VITALS — BP 158/100 | Temp 97.6°F | Resp 16 | Ht 67.0 in | Wt 203.2 lb

## 2021-03-04 DIAGNOSIS — G894 Chronic pain syndrome: Secondary | ICD-10-CM

## 2021-03-04 DIAGNOSIS — F419 Anxiety disorder, unspecified: Secondary | ICD-10-CM

## 2021-03-04 DIAGNOSIS — I10 Essential (primary) hypertension: Secondary | ICD-10-CM

## 2021-03-04 DIAGNOSIS — D5720 Sickle-cell/Hb-C disease without crisis: Secondary | ICD-10-CM

## 2021-03-04 DIAGNOSIS — G4733 Obstructive sleep apnea (adult) (pediatric): Secondary | ICD-10-CM

## 2021-03-04 MED ORDER — HYDROXYZINE PAMOATE 25 MG PO CAPS: 25.0000 mg | ORAL_CAPSULE | Freq: Three times a day (TID) | ORAL | 2 refills | Status: AC | PRN

## 2021-03-04 MED ORDER — OXYCODONE HCL 15 MG PO TABS: 15.0000 mg | ORAL_TABLET | ORAL | 0 refills | Status: DC | PRN

## 2021-03-04 MED ORDER — HYDRALAZINE HCL 50 MG PO TABS: 50.0000 mg | ORAL_TABLET | Freq: Three times a day (TID) | ORAL | 5 refills | Status: DC

## 2021-03-04 NOTE — Progress Notes (Signed)
[facility] [address] [place], [place]  [address] Phone:  [phone]   Fax:  [phone]    Established Patient Office Visit  Subjective:  Patient ID: Dean Webb, male    DOB: Aug 23, 1975  Age: 46 y.o. MRN: [id number]  CC: No chief complaint on file.   HPI Dean Webb presents for follow up. H  has a past medical history of Asthma, GERD (gastroesophageal reflux disease), Hb-S/Hb-C disease (HCC) (1979), HTN (hypertension) (2008), Hypertension, Sickle cell anemia (HCC), and Sickle cell disease (HCC).   He is currently prescribed Amlodipine 10 mg, Losartan 100 mg daily and hydralazine 25 mg TID. He has had 2 doses today.  He reports that he has been compliant with his medications.  He does not monitor BP. He hydrates with water .  He has had a 4 pound increase in his weight.  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   He has stress.  He reports the stress is due to his 4 children and family in general.  He is requesting something to relax him.  He is not for certain that he needs something daily but would like something when needed.  Denies any suicidal thoughts or homicidal ideations.   Past Medical History:  Diagnosis Date   Asthma    GERD (gastroesophageal reflux disease)    Hb-S/Hb-C disease (HCC) 1979   HTN (hypertension) 2008   Hypertension    Sickle cell anemia (HCC)    Sickle cell disease (HCC)     Past Surgical History:  Procedure Laterality Date   CHOLECYSTECTOMY      Family History  Problem Relation Age of Onset   Sickle cell trait Mother    Hypertension Mother    Asthma Mother    Sickle cell trait Father    Congestive Heart Failure Father    Hypertension Sister    Sickle cell anemia Daughter    Hypertension Daughter    Diabetes Daughter     Social History   Socioeconomic History   Marital status: Single    Spouse name: Not on file   Number of children: Not on file   Years of education: Not on file    Highest education level: Not on file  Occupational History   Not on file  Tobacco Use   Smoking status: Every Day    Packs/day: 0.50    Years: 17.00    Pack years: 8.50    Types: Cigarettes   Smokeless tobacco: Never  Vaping Use   Vaping Use: Never used  Substance and Sexual Activity   Alcohol use: Yes    Comment: Occasional drinker   Drug use: Not Currently    Types: Marijuana   Sexual activity: Yes  Other Topics Concern   Not on file  Social History Narrative   Not on file   Social Determinants of Health   Financial Resource Strain: Not on file  Food Insecurity: Not on file  Transportation Needs: Not on file  Physical Activity: Not on file  Stress: Not on file  Social Connections: Not on file  Intimate Partner Violence: Not on file    Outpatient Medications Prior to Visit  Medication Sig Dispense Refill   albuterol (VENTOLIN HFA) 108 (90 Base) MCG/ACT inhaler Inhale 2 puffs into the lungs every 6 (six) hours as needed for wheezing or shortness of breath. 8 g 1   amLODipine (NORVASC) 10 MG tablet Take 1 tablet (10 mg total)  by mouth daily. 90 tablet 3   Blood Pressure Monitoring (OMRON 3 SERIES BP MONITOR) DEVI 1 kit by Does not apply route daily. 1 each 0   cetirizine (ZYRTEC) 10 MG tablet TAKE 1 TABLET BY MOUTH EVERY DAY AS NEEDED FOR ALLERGY 90 tablet 1   folic acid (FOLVITE) 1 MG tablet Take 1 tablet (1 mg total) by mouth daily. 30 tablet 11   ibuprofen (ADVIL) 800 MG tablet Take 1 tablet (800 mg total) by mouth every 8 (eight) hours as needed. 30 tablet 11   losartan (COZAAR) 100 MG tablet Take 1 tablet (100 mg total) by mouth daily. 90 tablet 3   pantoprazole (PROTONIX) 40 MG tablet Take 1 tablet (40 mg total) by mouth daily. 30 tablet 3   hydrALAZINE (APRESOLINE) 25 MG tablet Take 1 tablet (25 mg total) by mouth 3 (three) times daily. 90 tablet 11   oxyCODONE (ROXICODONE) 15 MG immediate release tablet Take 1 tablet (15 mg total) by mouth every 4 (four) hours as  needed for up to 15 days for pain. 60 tablet 0   Facility-Administered Medications Prior to Visit  Medication Dose Route Frequency Provider Last Rate Last Admin   cloNIDine (CATAPRES) tablet 0.3 mg  0.3 mg Oral Once Barbette Merino, NP        No Known Allergies  ROS Review of Systems    Objective:    Physical Exam Constitutional:      Appearance: He is obese.  HENT:     Head: Normocephalic and atraumatic.     Nose: Nose normal.     Mouth/Throat:     Mouth: Mucous membranes are moist.  Cardiovascular:     Rate and Rhythm: Normal rate and regular rhythm.  Pulmonary:     Effort: Pulmonary effort is normal.     Breath sounds: Normal breath sounds.  Abdominal:     Palpations: Abdomen is soft.  Musculoskeletal:        General: Normal range of motion.     Cervical back: Normal range of motion.     Right lower leg: No edema.     Left lower leg: No edema.  Skin:    General: Skin is warm and dry.     Capillary Refill: Capillary refill takes less than 2 seconds.  Neurological:     General: No focal deficit present.     Mental Status: He is alert and oriented to person, place, and time.  Psychiatric:        Mood and Affect: Mood normal.        Behavior: Behavior normal.        Thought Content: Thought content normal.        Judgment: Judgment normal.    BP (!) 158/100 (BP Location: Right Arm, Patient Position: Sitting, Cuff Size: Large)    Temp 97.6 F (36.4 C)    Resp 16    Ht 5\' 7"  (1.702 m)    Wt 203 lb 3.2 oz (92.2 kg)    SpO2 96%    BMI 31.83 kg/m  Wt Readings from Last 3 Encounters:  03/04/21 203 lb 3.2 oz (92.2 kg)  01/17/21 210 lb 9.6 oz (95.5 kg)  08/26/20 206 lb 0.8 oz (93.5 kg)     Health Maintenance Due  Topic Date Due   COVID-19 Vaccine (1) Never done    There are no preventive care reminders to display for this patient.  No results found for: TSH Objective comfortable lab Results  Component Value Date   WBC 10.6 01/03/2021   HGB 15.8 01/03/2021    HCT 45.5 01/03/2021   MCV 86 01/03/2021   PLT 359 01/03/2021   Lab Results  Component Value Date   NA 138 01/03/2021   K 4.1 01/03/2021   CO2 23 01/03/2021   GLUCOSE 112 (H) 01/03/2021   BUN 9 01/03/2021   CREATININE 1.20 01/03/2021   BILITOT 1.9 (H) 01/03/2021   ALKPHOS 102 01/03/2021   AST 24 01/03/2021   ALT 20 01/03/2021   PROT 7.5 01/03/2021   ALBUMIN 4.8 01/03/2021   CALCIUM 11.2 (H) 01/03/2021   ANIONGAP 10 12/01/2019   EGFR 76 01/03/2021   Lab Results  Component Value Date   CHOL  11/06/2008    84        ATP III CLASSIFICATION:  <200     mg/dL   Desirable  200-239  mg/dL   Borderline High  >=240    mg/dL   High          Lab Results  Component Value Date   HDL 31 (L) 11/06/2008   Lab Results  Component Value Date   [facility]  11/06/2008    44        Total Cholesterol/HDL:CHD Risk Coronary Heart Disease Risk Table                     Men   Women  1/2 Average Risk   3.4   3.3  Average Risk       5.0   4.4  2 X Average Risk   9.6   7.1  3 X Average Risk  23.4   11.0        Use the calculated Patient Ratio above and the CHD Risk Table to determine the patient's CHD Risk.        ATP III CLASSIFICATION (LDL):  <100     mg/dL   Optimal  100-129  mg/dL   Near or Above                    Optimal  130-159  mg/dL   Borderline  160-189  mg/dL   High  >190     mg/dL   Very High   Lab Results  Component Value Date   TRIG 44 11/06/2008   Lab Results  Component Value Date   CHOLHDL 2.7 11/06/2008   No results found for: HGBA1C    Assessment & Plan:   Problem List Items Addressed This Visit       Cardiovascular and Mediastinum   Essential hypertension (Chronic) Uncontrolled  Increased hydralazine 50 mg sow tapering up by one up to TID   Encouraged on going compliance with current medication regimen Encouraged home monitoring and recording BP <130/80 Eating a heart-healthy diet with less salt Encouraged regular physical activity  Recommend  Weight loss     Relevant Medications   hydrALAZINE (APRESOLINE) 50 MG tablet     Respiratory   OSA (obstructive sleep apnea) (Chronic)   Relevant Orders   Split night study     Other   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 Move frequently to reduce venous thromboembolism risk. Avoid situations that could lead to dehydration or could exacerbate pain Discussed S&S of infection, seizures, stroke acute chest, DVT and how important it is to seek medical attention Take medication as directed along with pain contract and overall compliance Discussed the risk related to  opiate use (addition, tolerance and dependency)    Relevant Orders   [id number] 11+Oxyco+Alc+Crt-Bund   Sickle Cell Panel (Completed)   Chronic pain syndrome   Relevant Medications   oxyCODONE (ROXICODONE) 15 MG immediate release tablet (Start on 03/07/2021)   Other Relevant Orders   [id number] 11+Oxyco+Alc+Crt-Bund   Other Visit Diagnoses     Anxiety    -  Primary Worsening    Relevant Medications   hydrOXYzine (VISTARIL) 25 MG capsule       Meds ordered this encounter  Medications   hydrALAZINE (APRESOLINE) 50 MG tablet    Sig: Take 1 tablet (50 mg total) by mouth 3 (three) times daily.    Dispense:  90 tablet    Refill:  5    Order Specific Question:   Supervising Provider    Answer:   Tresa Garter [[id number]]   oxyCODONE (ROXICODONE) 15 MG immediate release tablet    Sig: Take 1 tablet (15 mg total) by mouth every 4 (four) hours as needed for up to 15 days for pain.    Dispense:  60 tablet    Refill:  0    The refill date is 03/07/2021. Thank you.    Order Specific Question:   Supervising Provider    Answer:   Tresa Garter [[id number]]   hydrOXYzine (VISTARIL) 25 MG capsule    Sig: Take 1 capsule (25 mg total) by mouth every 8 (eight) hours as needed.    Dispense:  60 capsule    Refill:  2    Order Specific Question:   Supervising Provider    Answer:    Tresa Garter [id number]    Follow-up: Return in about 1 week (around 03/11/2021) for Follow up HTN 40347.    Vevelyn Francois, NP

## 2021-03-04 NOTE — Patient Instructions (Signed)
[employer]. [phone]  Sickle Cell Anemia, Adult Sickle cell anemia is a condition where your red blood cells are shaped like sickles. Red blood cells carry oxygen through the body. Sickle-shaped cells do not live as long as normal red blood cells. They also clump together and block blood from flowing through the blood vessels. This prevents the body from getting enough oxygen. Sickle cell anemia causes organ damage and pain. It also increases the risk of infection. Follow these instructions at home: Medicines Take over-the-counter and prescription medicines only as told by your doctor. If you were prescribed an antibiotic medicine, take it as told by your doctor. Do not stop taking the antibiotic even if you start to feel better. If you develop a fever, do not take medicines to lower the fever right away. Tell your doctor about the fever. Managing pain, stiffness, and swelling Try these methods to help with pain: Use a heating pad. Take a warm bath. Distract yourself, such as by watching TV. Eating and drinking Drink enough fluid to keep your pee (urine) clear or pale yellow. Drink more in hot weather and during exercise. Limit or avoid alcohol. Eat a healthy diet. Eat plenty of fruits, vegetables, whole grains, and lean protein. Take vitamins and supplements as told by your doctor. Traveling When traveling, keep these with you: Your medical information. The names of your doctors. Your medicines. If you need to take an airplane, talk to your doctor first. Activity Rest often. Avoid exercises that make your heart beat much faster, such as jogging. General instructions Do not use products that have nicotine or tobacco, such as cigarettes and e-cigarettes. If you need help quitting, ask your doctor. Consider wearing a medical alert bracelet. Avoid being in high places (high altitudes), such as mountains. Avoid very hot or cold temperatures. Avoid places where the temperature  changes a lot. Keep all follow-up visits as told by your doctor. This is important. Contact a doctor if: A joint hurts. Your feet or hands hurt or swell. You feel tired (fatigued). Get help right away if: You have symptoms of infection. These include: Fever. Chills. Being very tired. Irritability. Poor eating. Throwing up (vomiting). You feel dizzy or faint. You have new stomach pain, especially on the left side. You have a an erection (priapism) that lasts more than 4 hours. You have numbness in your arms or legs. You have a hard time moving your arms or legs. You have trouble talking. You have pain that does not go away when you take medicine. You are short of breath. You are breathing fast. You have a long-term cough. You have pain in your chest. You have a bad headache. You have a stiff neck. Your stomach looks bloated even though you did not eat much. Your skin is pale. You suddenly cannot see well. Summary Sickle cell anemia is a condition where your red blood cells are shaped like sickles. Follow your doctor's advice on ways to manage pain, food to eat, activities to do, and steps to take for safe travel. Get medical help right away if you have any signs of infection, such as a fever. This information is not intended to replace advice given to you by your health care provider. Make sure you discuss any questions you have with your health care provider. Document Revised: 06/26/2019 Document Reviewed: 06/26/2019 Elsevier Patient Education  2022 [employer].

## 2021-03-05 ENCOUNTER — Encounter: Payer: Self-pay | Admitting: Nurse Practitioner

## 2021-03-05 LAB — CMP14+CBC/D/PLT+FER+RETIC+V...
ALT: 17 IU/L (ref 0–44)
AST: 16 IU/L (ref 0–40)
Albumin/Globulin Ratio: 1.7 (ref 1.2–2.2)
Albumin: 4.7 g/dL (ref 4.0–5.0)
Alkaline Phosphatase: 104 IU/L (ref 44–121)
BUN/Creatinine Ratio: 8 — ABNORMAL LOW (ref 9–20)
BUN: 9 mg/dL (ref 6–24)
Basophils Absolute: 0.1 10*3/uL (ref 0.0–0.2)
Basos: 1 %
Bilirubin Total: 1.2 mg/dL (ref 0.0–1.2)
CO2: 23 mmol/L (ref 20–29)
Calcium: 11.6 mg/dL — ABNORMAL HIGH (ref 8.7–10.2)
Chloride: 103 mmol/L (ref 96–106)
Creatinine, Ser: 1.16 mg/dL (ref 0.76–1.27)
EOS (ABSOLUTE): 0.6 10*3/uL — ABNORMAL HIGH (ref 0.0–0.4)
Eos: 5 %
Ferritin: 178 ng/mL (ref 30–400)
Globulin, Total: 2.7 g/dL (ref 1.5–4.5)
Glucose: 88 mg/dL (ref 70–99)
Hematocrit: 42.6 % (ref 37.5–51.0)
Hemoglobin: 15 g/dL (ref 13.0–17.7)
Immature Grans (Abs): 0.1 10*3/uL (ref 0.0–0.1)
Immature Granulocytes: 1 %
Lymphocytes Absolute: 4.5 10*3/uL — ABNORMAL HIGH (ref 0.7–3.1)
Lymphs: 42 %
MCH: 30.7 pg (ref 26.6–33.0)
MCHC: 35.2 g/dL (ref 31.5–35.7)
MCV: 87 fL (ref 79–97)
Monocytes Absolute: 1.4 10*3/uL — ABNORMAL HIGH (ref 0.1–0.9)
Monocytes: 13 %
NRBC: 1 % — ABNORMAL HIGH (ref 0–0)
Neutrophils Absolute: 3.9 10*3/uL (ref 1.4–7.0)
Neutrophils: 38 %
Platelets: 354 10*3/uL (ref 150–450)
Potassium: 4.7 mmol/L (ref 3.5–5.2)
RBC: 4.89 x10E6/uL (ref 4.14–5.80)
RDW: 16.1 % — ABNORMAL HIGH (ref 11.6–15.4)
Retic Ct Pct: 3.1 % — ABNORMAL HIGH (ref 0.6–2.6)
Sodium: 139 mmol/L (ref 134–144)
Total Protein: 7.4 g/dL (ref 6.0–8.5)
Vit D, 25-Hydroxy: 14.6 ng/mL — ABNORMAL LOW (ref 30.0–100.0)
WBC: 10.5 10*3/uL (ref 3.4–10.8)
eGFR: 80 mL/min/{1.73_m2} (ref >59)

## 2021-03-07 LAB — DRUG SCREEN 764883 11+OXYCO+ALC+CRT-BUND

## 2021-03-16 ENCOUNTER — Ambulatory Visit: Payer: Self-pay | Admitting: Nurse Practitioner

## 2021-03-21 ENCOUNTER — Telehealth: Payer: Self-pay

## 2021-03-21 ENCOUNTER — Other Ambulatory Visit: Payer: Self-pay | Admitting: Nurse Practitioner

## 2021-03-21 DIAGNOSIS — G894 Chronic pain syndrome: Secondary | ICD-10-CM

## 2021-03-21 MED ORDER — OXYCODONE HCL 15 MG PO TABS: 15.0000 mg | ORAL_TABLET | ORAL | 0 refills | Status: DC | PRN

## 2021-03-21 NOTE — Telephone Encounter (Signed)
The refill has been sent. Thanks  ° °

## 2021-03-21 NOTE — Telephone Encounter (Signed)
Oxycodone  °

## 2021-04-04 ENCOUNTER — Other Ambulatory Visit: Payer: Self-pay

## 2021-04-04 ENCOUNTER — Ambulatory Visit: Payer: Medicaid Other | Admitting: Nurse Practitioner

## 2021-04-04 ENCOUNTER — Ambulatory Visit: Payer: Self-pay | Admitting: Nurse Practitioner

## 2021-04-04 VITALS — BP 164/118 | HR 90 | Temp 97.9°F | Ht 67.0 in | Wt 199.4 lb

## 2021-04-04 DIAGNOSIS — G4733 Obstructive sleep apnea (adult) (pediatric): Secondary | ICD-10-CM

## 2021-04-04 DIAGNOSIS — J101 Influenza due to other identified influenza virus with other respiratory manifestations: Secondary | ICD-10-CM

## 2021-04-04 DIAGNOSIS — R52 Pain, unspecified: Secondary | ICD-10-CM | POA: Diagnosis not present

## 2021-04-04 DIAGNOSIS — R112 Nausea with vomiting, unspecified: Secondary | ICD-10-CM | POA: Diagnosis not present

## 2021-04-04 DIAGNOSIS — I161 Hypertensive emergency: Secondary | ICD-10-CM

## 2021-04-04 DIAGNOSIS — R6883 Chills (without fever): Secondary | ICD-10-CM | POA: Diagnosis not present

## 2021-04-04 DIAGNOSIS — G894 Chronic pain syndrome: Secondary | ICD-10-CM | POA: Diagnosis not present

## 2021-04-04 DIAGNOSIS — D5720 Sickle-cell/Hb-C disease without crisis: Secondary | ICD-10-CM

## 2021-04-04 DIAGNOSIS — I10 Essential (primary) hypertension: Secondary | ICD-10-CM

## 2021-04-04 LAB — POCT INFLUENZA A/B
Influenza A, POC: POSITIVE — AB
Influenza B, POC: NEGATIVE

## 2021-04-04 MED ORDER — OXYCODONE HCL 15 MG PO TABS: 15.0000 mg | ORAL_TABLET | ORAL | 0 refills | Status: DC | PRN

## 2021-04-04 MED ORDER — OSELTAMIVIR PHOSPHATE 75 MG PO CAPS: 75.0000 mg | ORAL_CAPSULE | Freq: Two times a day (BID) | ORAL | 0 refills | Status: AC

## 2021-04-04 NOTE — Progress Notes (Signed)
[place], [place]  [address] Phone:  [phone]   Fax:  [phone]   Acute Office Visit  Subjective:    Patient ID: Dean Webb, male    DOB: 1975/06/02, 46 y.o.   MRN: [id number]  Chief Complaint  Patient presents with   Generalized Body Aches    Pt is here today to discuss body aches, lower back pain, abdominal pains, nausea vomiting, chills off and on, dizziness, headaches, blisters on bottom and top lip x 1 week.    HPI Patient is in today for body aches. He  has a past medical history of Asthma, GERD (gastroesophageal reflux disease), Hb-S/Hb-C disease ([place]) (1979), HTN (hypertension) (2008), Hypertension, Sickle cell anemia ([place]), and Sickle cell disease ([place]).   His BP is elevated and all he additional symptoms.   Past Medical History:  Diagnosis Date   Asthma    GERD (gastroesophageal reflux disease)    Hb-S/Hb-C disease ([place]) 1979   HTN (hypertension) 2008   Hypertension    Sickle cell anemia (HCC)    Sickle cell disease ([place])     Past Surgical History:  Procedure Laterality Date   CHOLECYSTECTOMY      Family History  Problem Relation Age of Onset   Sickle cell trait Mother    Hypertension Mother    Asthma Mother    Sickle cell trait Father    Congestive Heart Failure Father    Hypertension Sister    Sickle cell anemia Daughter    Hypertension Daughter    Diabetes Daughter     Social History   Socioeconomic History   Marital status: Single    Spouse name: Not on file   Number of children: Not on file   Years of education: Not on file   Highest education level: Not on file  Occupational History   Not on file  Tobacco Use   Smoking status: Every Day    Packs/day: 0.50    Years: 17.00    Pack years: 8.50    Types: Cigarettes   Smokeless tobacco: Never  Vaping Use   Vaping Use: Never used  Substance and Sexual Activity   Alcohol use: Yes    Comment: Occasional drinker   Drug use: Not  Currently    Types: Marijuana   Sexual activity: Yes  Other Topics Concern   Not on file  Social History Narrative   Not on file   Social Determinants of Health   Financial Resource Strain: Not on file  Food Insecurity: Not on file  Transportation Needs: Not on file  Physical Activity: Not on file  Stress: Not on file  Social Connections: Not on file  Intimate Partner Violence: Not on file    Outpatient Medications Prior to Visit  Medication Sig Dispense Refill   albuterol (VENTOLIN HFA) 108 (90 Base) MCG/ACT inhaler Inhale 2 puffs into the lungs every 6 (six) hours as needed for wheezing or shortness of breath. 8 g 1   amLODipine (NORVASC) 10 MG tablet Take 1 tablet (10 mg total) by mouth daily. 90 tablet 3   Blood Pressure Monitoring (OMRON 3 SERIES BP MONITOR) DEVI 1 kit by Does not apply route daily. 1 each 0   cetirizine (ZYRTEC) 10 MG tablet TAKE 1 TABLET BY MOUTH EVERY DAY AS NEEDED FOR ALLERGY 90 tablet 1   folic acid (FOLVITE) 1 MG tablet Take 1 tablet (1 mg total) by mouth daily. 30 tablet 11  hydrALAZINE (APRESOLINE) 50 MG tablet Take 1 tablet (50 mg total) by mouth 3 (three) times daily. 90 tablet 5   hydrOXYzine (VISTARIL) 25 MG capsule Take 1 capsule (25 mg total) by mouth every 8 (eight) hours as needed. 60 capsule 2   ibuprofen (ADVIL) 800 MG tablet Take 1 tablet (800 mg total) by mouth every 8 (eight) hours as needed. 30 tablet 11   losartan (COZAAR) 100 MG tablet Take 1 tablet (100 mg total) by mouth daily. 90 tablet 3   pantoprazole (PROTONIX) 40 MG tablet Take 1 tablet (40 mg total) by mouth daily. 30 tablet 3   oxyCODONE (ROXICODONE) 15 MG immediate release tablet Take 1 tablet (15 mg total) by mouth every 4 (four) hours as needed for up to 15 days for pain. 60 tablet 0   Facility-Administered Medications Prior to Visit  Medication Dose Route Frequency Provider Last Rate Last Admin   cloNIDine (CATAPRES) tablet 0.3 mg  0.3 mg Oral Once Vevelyn Francois, NP         No Known Allergies  Review of Systems     Objective:    Physical Exam  BP (!) 164/118 (BP Location: Left Arm, Cuff Size: Large)    Pulse 90    Temp 97.9 F (36.6 C)    Ht _0  (1.702 m)    Wt 199 lb 6.4 oz (90.4 kg)    SpO2 99%    BMI 31.23 kg/m  Wt Readings from Last 3 Encounters:  04/04/21 199 lb 6.4 oz (90.4 kg)  03/04/21 203 lb 3.2 oz (92.2 kg)  01/17/21 210 lb 9.6 oz (95.5 kg)    Health Maintenance Due  Topic Date Due   COVID-19 Vaccine (1) Never done    There are no preventive care reminders to display for this patient.   No results found for: TSH Lab Results  Component Value Date   WBC 10.5 03/04/2021   HGB 15.0 03/04/2021   HCT 42.6 03/04/2021   MCV 87 03/04/2021   PLT 354 03/04/2021   Lab Results  Component Value Date   NA 139 03/04/2021   K 4.7 03/04/2021   CO2 23 03/04/2021   GLUCOSE 88 03/04/2021   BUN 9 03/04/2021   CREATININE 1.16 03/04/2021   BILITOT 1.2 03/04/2021   ALKPHOS 104 03/04/2021   AST 16 03/04/2021   ALT 17 03/04/2021   PROT 7.4 03/04/2021   ALBUMIN 4.7 03/04/2021   CALCIUM 11.6 (H) 03/04/2021   ANIONGAP 10 12/01/2019   EGFR 80 03/04/2021   Lab Results  Component Value Date   CHOL  11/06/2008    84        ATP III CLASSIFICATION:  <200     mg/dL   Desirable  200-239  mg/dL   Borderline High  >=240    mg/dL   High          Lab Results  Component Value Date   HDL 31 (L) 11/06/2008   Lab Results  Component Value Date   LDLCALC  11/06/2008    44        Total Cholesterol/HDL:CHD Risk Coronary Heart Disease Risk Table                     Men   Women  1/2 Average Risk   3.4   3.3  Average Risk       5.0   4.4  2 X Average Risk   9.6   7.1  3 X Average Risk  23.4   11.0        Use the calculated Patient Ratio above and the CHD Risk Table to determine the patient's CHD Risk.        ATP III CLASSIFICATION (LDL):  <100     mg/dL   Optimal  100-129  mg/dL   Near or Above                    Optimal  130-159   mg/dL   Borderline  160-189  mg/dL   High  >190     mg/dL   Very High   Lab Results  Component Value Date   TRIG 44 11/06/2008   Lab Results  Component Value Date   CHOLHDL 2.7 11/06/2008   No results found for: HGBA1C     Assessment & Plan:   Problem List Items Addressed This Visit       Other   Chronic pain syndrome   Other Visit Diagnoses     Body aches    -  Primary   Relevant Orders   Influenza A/B (Completed)   Chills       Relevant Orders   Influenza A/B (Completed)   Nausea and vomiting, unspecified vomiting type       Relevant Orders   Influenza A/B (Completed)   Hypertensive emergency without congestive heart failure     Advised to go the ED for hypertension        Meds ordered this encounter  Medications   oxyCODONE (ROXICODONE) 15 MG immediate release tablet    Sig: Take 1 tablet (15 mg total) by mouth every 4 (four) hours as needed for up to 15 days for pain.    Dispense:  60 tablet    Refill:  0    The refill date is 04/06/2021. Thank you.   oseltamivir (TAMIFLU) 75 MG capsule    Sig: Take 1 capsule (75 mg total) by mouth 2 (two) times daily for 5 days.    Dispense:  10 capsule    Refill:  0    Order Specific Question:   Supervising Provider    Answer:   Tresa Garter [[id number]]     Vevelyn Francois, NP

## 2021-04-04 NOTE — Patient Instructions (Signed)
Managing Your Hypertension °Hypertension, also called high blood pressure, is when the force of the blood pressing against the walls of the arteries is too strong. Arteries are blood vessels that carry blood from your heart throughout your body. Hypertension forces the heart to work harder to pump blood and may cause the arteries to become narrow or stiff. °Understanding blood pressure readings °Your personal target blood pressure may vary depending on your medical conditions, your age, and other factors. A blood pressure reading includes a higher number over a lower number. Ideally, your blood pressure should be below 120/80. You should know that: °The first, or top, number is called the systolic pressure. It is a measure of the pressure in your arteries as your heart beats. °The second, or bottom number, is called the diastolic pressure. It is a measure of the pressure in your arteries as the heart relaxes. °Blood pressure is classified into four stages. Based on your blood pressure reading, your health care provider may use the following stages to determine what type of treatment you need, if any. Systolic pressure and diastolic pressure are measured in a unit called mmHg. °Normal °Systolic pressure: below 120. °Diastolic pressure: below 80. °Elevated °Systolic pressure: 120-129. °Diastolic pressure: below 80. °Hypertension stage 1 °Systolic pressure: 130-139. °Diastolic pressure: 80-89. °Hypertension stage 2 °Systolic pressure: 140 or above. °Diastolic pressure: 90 or above. °How can this condition affect me? °Managing your hypertension is an important responsibility. Over time, hypertension can damage the arteries and decrease blood flow to important parts of the body, including the brain, heart, and kidneys. Having untreated or uncontrolled hypertension can lead to: °A heart attack. °A stroke. °A weakened blood vessel (aneurysm). °Heart failure. °Kidney damage. °Eye damage. °Metabolic syndrome. °Memory and  concentration problems. °Vascular dementia. °What actions can I take to manage this condition? °Hypertension can be managed by making lifestyle changes and possibly by taking medicines. Your health care provider will help you make a plan to bring your blood pressure within a normal range. °Nutrition ° °Eat a diet that is high in fiber and potassium, and low in salt (sodium), added sugar, and fat. An example eating plan is called the Dietary Approaches to Stop Hypertension (DASH) diet. To eat this way: °Eat plenty of fresh fruits and vegetables. Try to fill one-half of your plate at each meal with fruits and vegetables. °Eat whole grains, such as whole-wheat pasta, brown rice, or whole-grain bread. Fill about one-fourth of your plate with whole grains. °Eat low-fat dairy products. °Avoid fatty cuts of meat, processed or cured meats, and poultry with skin. Fill about one-fourth of your plate with lean proteins such as fish, chicken without skin, beans, eggs, and tofu. °Avoid pre-made and processed foods. These tend to be higher in sodium, added sugar, and fat. °Reduce your daily sodium intake. Most people with hypertension should eat less than 1,500 mg of sodium a day. °Lifestyle ° °Work with your health care provider to maintain a healthy body weight or to lose weight. Ask what an ideal weight is for you. °Get at least 30 minutes of exercise that causes your heart to beat faster (aerobic exercise) most days of the week. Activities may include walking, swimming, or biking. °Include exercise to strengthen your muscles (resistance exercise), such as weight lifting, as part of your weekly exercise routine. Try to do these types of exercises for 30 minutes at least 3 days a week. °Do not use any products that contain nicotine or tobacco, such as cigarettes, e-cigarettes,   and chewing tobacco. If you need help quitting, ask your health care provider. Control any long-term (chronic) conditions you have, such as high  cholesterol or diabetes. Identify your sources of stress and find ways to manage stress. This may include meditation, deep breathing, or making time for fun activities. Alcohol use Do not drink alcohol if: Your health care provider tells you not to drink. You are pregnant, may be pregnant, or are planning to become pregnant. If you drink alcohol: Limit how much you use to: 0-1 drink a day for women. 0-2 drinks a day for men. Be aware of how much alcohol is in your drink. In the [place] one drink equals one 12 oz bottle of beer (355 mL), one 5 oz glass of wine (148 mL), or one 1 oz glass of hard liquor (44 mL). Medicines Your health care provider may prescribe medicine if lifestyle changes are not enough to get your blood pressure under control and if: Your systolic blood pressure is 130 or higher. Your diastolic blood pressure is 80 or higher. Take medicines only as told by your health care provider. Follow the directions carefully. Blood pressure medicines must be taken as told by your health care provider. The medicine does not work as well when you skip doses. Skipping doses also puts you at risk for problems. Monitoring Before you monitor your blood pressure: Do not smoke, drink caffeinated beverages, or exercise within 30 minutes before taking a measurement. Use the bathroom and empty your bladder (urinate). Sit quietly for at least 5 minutes before taking measurements. Monitor your blood pressure at home as told by your health care provider. To do this: Sit with your back straight and supported. Place your feet flat on the floor. Do not cross your legs. Support your arm on a flat surface, such as a table. Make sure your upper arm is at heart level. Each time you measure, take two or three readings one minute apart and record the results. You may also need to have your blood pressure checked regularly by your health care provider. General information Talk with your health care  provider about your diet, exercise habits, and other lifestyle factors that may be contributing to hypertension. Review all the medicines you take with your health care provider because there may be side effects or interactions. Keep all visits as told by your health care provider. Your health care provider can help you create and adjust your plan for managing your high blood pressure. Where to find more information National Heart, Lung, and Blood Institute: https://wilson-eaton.com/ American Heart Association: www.heart.org Contact a health care provider if: You think you are having a reaction to medicines you have taken. You have repeated (recurrent) headaches. You feel dizzy. You have swelling in your ankles. You have trouble with your vision. Get help right away if: You develop a severe headache or confusion. You have unusual weakness or numbness, or you feel faint. You have severe pain in your chest or abdomen. You vomit repeatedly. You have trouble breathing. These symptoms may represent a serious problem that is an emergency. Do not wait to see if the symptoms will go away. Get medical help right away. Call your local emergency services (911 in the [place] Do not drive yourself to the hospital. Summary Hypertension is when the force of blood pumping through your arteries is too strong. If this condition is not controlled, it may put you at risk for serious complications. Your personal target blood pressure may vary depending on  your medical conditions, your age, and other factors. For most people, a normal blood pressure is less than 120/80. Hypertension is managed by lifestyle changes, medicines, or both. Lifestyle changes to help manage hypertension include losing weight, eating a healthy, low-sodium diet, exercising more, stopping smoking, and limiting alcohol. This information is not intended to replace advice given to you by your health care provider. Make sure you discuss any questions  you have with your health care provider. Document Revised: 02/17/2019 Document Reviewed: 12/31/2018 Elsevier Patient Education  2022 [employer]. Influenza, Adult Influenza is also called "the flu." It is an infection in the lungs, nose, and throat (respiratory tract). It spreads easily from person to person (is contagious). The flu causes symptoms that are like a cold, along with high fever and body aches. What are the causes? This condition is caused by the influenza virus. You can get the virus by: Breathing in droplets that are in the air after a person infected with the flu coughed or sneezed. Touching something that has the virus on it and then touching your mouth, nose, or eyes. What increases the risk? Certain things may make you more likely to get the flu. These include: Not washing your hands often. Having close contact with many people during cold and flu season. Touching your mouth, eyes, or nose without first washing your hands. Not getting a flu shot every year. You may have a higher risk for the flu, and serious problems, such as a lung infection (pneumonia), if you: Are older than 65. Are pregnant. Have a weakened disease-fighting system (immune system) because of a disease or because you are taking certain medicines. Have a long-term (chronic) condition, such as: Heart, kidney, or lung disease. Diabetes. Asthma. Have a liver disorder. Are very overweight (morbidly obese). Have anemia. What are the signs or symptoms? Symptoms usually begin suddenly and last 4-14 days. They may include: Fever and chills. Headaches, body aches, or muscle aches. Sore throat. Cough. Runny or stuffy (congested) nose. Feeling discomfort in your chest. Not wanting to eat as much as normal. Feeling weak or tired. Feeling dizzy. Feeling sick to your stomach or throwing up. How is this treated? If the flu is found early, you can be treated with antiviral medicine. This can help to  reduce how bad the illness is and how long it lasts. This may be given by mouth or through an IV tube. Taking care of yourself at home can help your symptoms get better. Your doctor may want you to: Take over-the-counter medicines. Drink plenty of fluids. The flu often goes away on its own. If you have very bad symptoms or other problems, you may be treated in a hospital. Follow these instructions at home:   Activity Rest as needed. Get plenty of sleep. Stay home from work or school as told by your doctor. Do not leave home until you do not have a fever for 24 hours without taking medicine. Leave home only to go to your doctor. Eating and drinking Take an ORS (oral rehydration solution). This is a drink that is sold at pharmacies and stores. Drink enough fluid to keep your pee pale yellow. Drink clear fluids in small amounts as you are able. Clear fluids include: Water. Ice chips. Fruit juice mixed with water. Low-calorie sports drinks. Eat bland foods that are easy to digest. Eat small amounts as you are able. These foods include: Bananas. Applesauce. Rice. Lean meats. Toast. Crackers. Do not eat or drink: Fluids that have  a lot of sugar or caffeine. Alcohol. Spicy or fatty foods. General instructions Take over-the-counter and prescription medicines only as told by your doctor. Use a cool mist humidifier to add moisture to the air in your home. This can make it easier for you to breathe. When using a cool mist humidifier, clean it daily. Empty water and replace with clean water. Cover your mouth and nose when you cough or sneeze. Wash your hands with soap and water often and for at least 20 seconds. This is also important after you cough or sneeze. If you cannot use soap and water, use alcohol-based hand sanitizer. Keep all follow-up visits. How is this prevented?  Get a flu shot every year. You may get the flu shot in late summer, fall, or winter. Ask your doctor when you  should get your flu shot. Avoid contact with people who are sick during fall and winter. This is cold and flu season. Contact a doctor if: You get new symptoms. You have: Chest pain. Watery poop (diarrhea). A fever. Your cough gets worse. You start to have more mucus. You feel sick to your stomach. You throw up. Get help right away if you: Have shortness of breath. Have trouble breathing. Have skin or nails that turn a bluish color. Have very bad pain or stiffness in your neck. Get a sudden headache. Get sudden pain in your face or ear. Cannot eat or drink without throwing up. These symptoms may represent a serious problem that is an emergency. Get medical help right away. Call your local emergency services (911 in the [place] Do not wait to see if the symptoms will go away. Do not drive yourself to the hospital. Summary Influenza is also called "the flu." It is an infection in the lungs, nose, and throat. It spreads easily from person to person. Take over-the-counter and prescription medicines only as told by your doctor. Getting a flu shot every year is the best way to not get the flu. This information is not intended to replace advice given to you by your health care provider. Make sure you discuss any questions you have with your health care provider. Document Revised: 09/19/2019 Document Reviewed: 09/19/2019 Elsevier Patient Education  2022 [employer].

## 2021-04-19 ENCOUNTER — Telehealth: Payer: Self-pay

## 2021-04-19 NOTE — Telephone Encounter (Signed)
Oxycodone  °

## 2021-04-20 ENCOUNTER — Other Ambulatory Visit: Payer: Self-pay | Admitting: Nurse Practitioner

## 2021-04-20 DIAGNOSIS — G894 Chronic pain syndrome: Secondary | ICD-10-CM

## 2021-04-20 MED ORDER — OXYCODONE HCL 15 MG PO TABS: 15.0000 mg | ORAL_TABLET | ORAL | 0 refills | Status: DC | PRN

## 2021-04-23 ENCOUNTER — Other Ambulatory Visit: Payer: Self-pay | Admitting: Nurse Practitioner

## 2021-04-23 DIAGNOSIS — G894 Chronic pain syndrome: Secondary | ICD-10-CM

## 2021-04-23 NOTE — Progress Notes (Signed)
   [facility] [address] [place], [place]  [address] Phone:  [phone]   Fax:  [phone] 

## 2021-04-29 ENCOUNTER — Ambulatory Visit: Payer: Self-pay | Admitting: Nurse Practitioner

## 2021-05-02 ENCOUNTER — Ambulatory Visit: Payer: Self-pay | Admitting: Nurse Practitioner

## 2021-05-04 ENCOUNTER — Other Ambulatory Visit: Payer: Self-pay

## 2021-05-04 ENCOUNTER — Telehealth: Payer: Self-pay

## 2021-05-04 ENCOUNTER — Encounter: Payer: Self-pay | Admitting: Nurse Practitioner

## 2021-05-04 ENCOUNTER — Other Ambulatory Visit: Payer: Self-pay | Admitting: Nurse Practitioner

## 2021-05-04 ENCOUNTER — Ambulatory Visit (INDEPENDENT_AMBULATORY_CARE_PROVIDER_SITE_OTHER): Payer: Medicaid Other | Admitting: Nurse Practitioner

## 2021-05-04 ENCOUNTER — Ambulatory Visit: Payer: Medicaid Other | Admitting: Cardiology

## 2021-05-04 ENCOUNTER — Telehealth: Payer: Self-pay | Admitting: Nurse Practitioner

## 2021-05-04 VITALS — BP 162/105 | HR 91 | Temp 98.3°F | Ht 67.0 in | Wt 198.0 lb

## 2021-05-04 DIAGNOSIS — G894 Chronic pain syndrome: Secondary | ICD-10-CM | POA: Diagnosis not present

## 2021-05-04 DIAGNOSIS — I10 Essential (primary) hypertension: Secondary | ICD-10-CM

## 2021-05-04 DIAGNOSIS — D57219 Sickle-cell/Hb-C disease with crisis, unspecified: Secondary | ICD-10-CM

## 2021-05-04 MED ORDER — DULOXETINE HCL 30 MG PO CPEP: 30.0000 mg | ORAL_CAPSULE | Freq: Every day | ORAL | 1 refills | Status: DC

## 2021-05-04 MED ORDER — OXYCODONE HCL 15 MG PO TABS: 15.0000 mg | ORAL_TABLET | ORAL | 0 refills | Status: DC | PRN

## 2021-05-04 NOTE — Progress Notes (Signed)
[facility] Patient [facility] [address] [place], [place]  [address] Phone:  [phone]   Fax:  [phone]    Established Patient Office Visit  Subjective:  Patient ID: Dean Webb, male    DOB: Feb 06, 1976  Age: 46 y.o. MRN: [id number]  CC:  Chief Complaint  Patient presents with   Follow-up    Pt is here for follow up visit. Pt is requesting medication for back pain pt stated he didn't get BP machine    HPI' Dean Webb presents for follow up. He  has a past medical history of Asthma, GERD (gastroesophageal reflux disease), Hb-S/Hb-C disease (HCC) (1979), HTN (hypertension) (2008), Hypertension, Sickle cell anemia (HCC), and Sickle cell disease (HCC).   He is following up today for his SCD and HTN. He reports that he has an apt with cardiology today. His BP continues to remain elevated. He reports being compliant with his Amlodipine 10 mg and his hydralazine 50 mg TID. He do continue to smoke.  He is having back and neck pain. He is also having right knee pain and swelling. This was secondary to a fall. He is not sure what happened; he may have tripped. He does have swelling. He was not seen. He feels like the pain is improving overall.  He has been on morphine in the past and he has opted to be off of this. He starts a Engineer, agricultural job. He is concern about the possible pain.   Past Medical History:  Diagnosis Date   Asthma    GERD (gastroesophageal reflux disease)    Hb-S/Hb-C disease (HCC) 1979   HTN (hypertension) 2008   Hypertension    Sickle cell anemia (HCC)    Sickle cell disease (HCC)     Past Surgical History:  Procedure Laterality Date   CHOLECYSTECTOMY      Family History  Problem Relation Age of Onset   Sickle cell trait Mother    Hypertension Mother    Asthma Mother    Sickle cell trait Father    Congestive Heart Failure Father    Hypertension Sister    Sickle cell anemia Daughter    Hypertension Daughter    Diabetes Daughter      Social History   Socioeconomic History   Marital status: Single    Spouse name: Not on file   Number of children: Not on file   Years of education: Not on file   Highest education level: Not on file  Occupational History   Not on file  Tobacco Use   Smoking status: Every Day    Packs/day: 0.50    Years: 17.00    Pack years: 8.50    Types: Cigarettes   Smokeless tobacco: Never  Vaping Use   Vaping Use: Never used  Substance and Sexual Activity   Alcohol use: Yes    Comment: Occasional drinker   Drug use: Not Currently    Types: Marijuana   Sexual activity: Yes  Other Topics Concern   Not on file  Social History Narrative   Not on file   Social Determinants of Health   Financial Resource Strain: Not on file  Food Insecurity: Not on file  Transportation Needs: Not on file  Physical Activity: Not on file  Stress: Not on file  Social Connections: Not on file  Intimate Partner Violence: Not on file    Outpatient Medications Prior to Visit  Medication Sig Dispense Refill   albuterol (VENTOLIN HFA) 108 (  90 Base) MCG/ACT inhaler Inhale 2 puffs into the lungs every 6 (six) hours as needed for wheezing or shortness of breath. 8 g 1   amLODipine (NORVASC) 10 MG tablet Take 1 tablet (10 mg total) by mouth daily. 90 tablet 3   Blood Pressure Monitoring (OMRON 3 SERIES BP MONITOR) DEVI 1 kit by Does not apply route daily. 1 each 0   cetirizine (ZYRTEC) 10 MG tablet TAKE 1 TABLET BY MOUTH EVERY DAY AS NEEDED FOR ALLERGY 90 tablet 1   folic acid (FOLVITE) 1 MG tablet Take 1 tablet (1 mg total) by mouth daily. 30 tablet 11   hydrALAZINE (APRESOLINE) 50 MG tablet Take 1 tablet (50 mg total) by mouth 3 (three) times daily. 90 tablet 5   hydrOXYzine (VISTARIL) 25 MG capsule Take 1 capsule (25 mg total) by mouth every 8 (eight) hours as needed. 60 capsule 2   ibuprofen (ADVIL) 800 MG tablet Take 1 tablet (800 mg total) by mouth every 8 (eight) hours as needed. 30 tablet 11    losartan (COZAAR) 100 MG tablet Take 1 tablet (100 mg total) by mouth daily. 90 tablet 3   pantoprazole (PROTONIX) 40 MG tablet Take 1 tablet (40 mg total) by mouth daily. 30 tablet 3   oxyCODONE (ROXICODONE) 15 MG immediate release tablet Take 1 tablet (15 mg total) by mouth every 4 (four) hours as needed for up to 15 days for pain. 60 tablet 0   Facility-Administered Medications Prior to Visit  Medication Dose Route Frequency Provider Last Rate Last Admin   cloNIDine (CATAPRES) tablet 0.3 mg  0.3 mg Oral Once Barbette Merino, NP        No Known Allergies  ROS Review of Systems    Objective:    Physical Exam Constitutional:      Appearance: He is obese.  HENT:     Head: Normocephalic and atraumatic.     Nose: Nose normal.     Mouth/Throat:     Mouth: Mucous membranes are moist.  Cardiovascular:     Rate and Rhythm: Normal rate and regular rhythm.     Pulses: Normal pulses.     Heart sounds: Normal heart sounds.  Pulmonary:     Effort: Pulmonary effort is normal.     Breath sounds: Normal breath sounds.  Abdominal:     Palpations: Abdomen is soft.  Musculoskeletal:        General: Normal range of motion.     Cervical back: Normal range of motion.     Right lower leg: No edema.     Left lower leg: No edema.  Skin:    General: Skin is warm and dry.     Capillary Refill: Capillary refill takes less than 2 seconds.  Neurological:     General: No focal deficit present.     Mental Status: He is alert.  Psychiatric:        Mood and Affect: Mood normal.        Behavior: Behavior normal.        Thought Content: Thought content normal.        Judgment: Judgment normal.    BP (!) 162/105 (BP Location: Left Arm, Patient Position: Sitting)   Pulse 91   Temp 98.3 F (36.8 C)   Ht 5\' 7"  (1.702 m)   Wt 198 lb (89.8 kg)   SpO2 100%   BMI 31.01 kg/m  Wt Readings from Last 3 Encounters:  05/04/21 198 lb (89.8 kg)  04/04/21 199 lb 6.4 oz (90.4 kg)  03/04/21 203 lb 3.2 oz  (92.2 kg)     Health Maintenance Due  Topic Date Due   COVID-19 Vaccine (1) Never done   COLONOSCOPY (Pts 45-44yrs Insurance coverage will need to be confirmed)  Never done    There are no preventive care reminders to display for this patient.  No results found for: TSH Lab Results  Component Value Date   WBC 10.5 03/04/2021   HGB 15.0 03/04/2021   HCT 42.6 03/04/2021   MCV 87 03/04/2021   PLT 354 03/04/2021   Lab Results  Component Value Date   NA 139 03/04/2021   K 4.7 03/04/2021   CO2 23 03/04/2021   GLUCOSE 88 03/04/2021   BUN 9 03/04/2021   CREATININE 1.16 03/04/2021   BILITOT 1.2 03/04/2021   ALKPHOS 104 03/04/2021   AST 16 03/04/2021   ALT 17 03/04/2021   PROT 7.4 03/04/2021   ALBUMIN 4.7 03/04/2021   CALCIUM 11.6 (H) 03/04/2021   ANIONGAP 10 12/01/2019   EGFR 80 03/04/2021   Lab Results  Component Value Date   CHOL  11/06/2008    84        ATP III CLASSIFICATION:  <200     mg/dL   Desirable  161-096  mg/dL   Borderline High  >=045    mg/dL   High          Lab Results  Component Value Date   HDL 31 (L) 11/06/2008   Lab Results  Component Value Date   [facility]  11/06/2008    44        Total Cholesterol/HDL:CHD Risk Coronary Heart Disease Risk Table                     Men   Women  1/2 Average Risk   3.4   3.3  Average Risk       5.0   4.4  2 X Average Risk   9.6   7.1  3 X Average Risk  23.4   11.0        Use the calculated Patient Ratio above and the CHD Risk Table to determine the patient's CHD Risk.        ATP III CLASSIFICATION (LDL):  <100     mg/dL   Optimal  409-811  mg/dL   Near or Above                    Optimal  130-159  mg/dL   Borderline  914-782  mg/dL   High  >956     mg/dL   Very High   Lab Results  Component Value Date   TRIG 44 11/06/2008   Lab Results  Component Value Date   CHOLHDL 2.7 11/06/2008   No results found for: HGBA1C    Assessment & Plan:   Problem List Items Addressed This Visit        Cardiovascular and Mediastinum   Essential hypertension (Chronic) Worsening despite treatment changes  Establishing care today with cardiology for other treatment options and evaluation       Other   Chronic pain syndrome Persistent  Trial Duloxetine 30 mg with potential to increase for chronic pain related to SCD He will hold Duloxetine due to potential medication adjustment with cardiology. Discussed not starting 2 new treatments at the same time. Verbalized understanding.    Sickle-cell/Hb-C disease with crisis, unspecified (HCC) - Primary Ensure adequate  hydration. Move frequently to reduce venous thromboembolism risk. Avoid situations that could lead to dehydration or could exacerbate pain Discussed S&S of infection, seizures, stroke acute chest, DVT and how important it is to seek medical attention Take medication as directed along with pain contract and overall compliance Discussed the risk related to opiate use (addition, tolerance and dependency)     Meds ordered this encounter  Medications    DULoxetine (CYMBALTA) 30 MG capsule    Sig: Take 1 capsule (30 mg total) by mouth daily.    Dispense:  90 capsule    Refill:  1    Order Specific Question:   Supervising Provider    Answer:   Quentin Angst [id number]    Follow-up: Return in about 6 weeks (around 06/15/2021) for follow up medication management [id number].    Barbette Merino, NP

## 2021-05-04 NOTE — Patient Instructions (Signed)
Sickle Cell Anemia, Adult ?Sickle cell anemia is a condition where your red blood cells are shaped like sickles. Red blood cells carry oxygen through the body. Sickle-shaped cells do not live as long as normal red blood cells. They also clump together and block blood from flowing through the blood vessels. This prevents the body from getting enough oxygen. Sickle cell anemia causes organ damage and pain. It also increases the risk of infection. ?Follow these instructions at home: ?Medicines ?Take over-the-counter and prescription medicines only as told by your doctor. ?If you were prescribed an antibiotic medicine, take it as told by your doctor. Do not stop taking the antibiotic even if you start to feel better. ?If you develop a fever, do not take medicines to lower the fever right away. Tell your doctor about the fever. ?Managing pain, stiffness, and swelling ?Try these methods to help with pain: ?Use a heating pad. ?Take a warm bath. ?Distract yourself, such as by watching TV. ?Eating and drinking ?Drink enough fluid to keep your pee (urine) clear or pale yellow. Drink more in hot weather and during exercise. ?Limit or avoid alcohol. ?Eat a healthy diet. Eat plenty of fruits, vegetables, whole grains, and lean protein. ?Take vitamins and supplements as told by your doctor. ?Traveling ?When traveling, keep these with you: ?Your medical information. ?The names of your doctors. ?Your medicines. ?If you need to take an airplane, talk to your doctor first. ?Activity ?Rest often. ?Avoid exercises that make your heart beat much faster, such as jogging. ?General instructions ?Do not use products that have nicotine or tobacco, such as cigarettes and e-cigarettes. If you need help quitting, ask your doctor. ?Consider wearing a medical alert bracelet. ?Avoid being in high places (high altitudes), such as mountains. ?Avoid very hot or cold temperatures. ?Avoid places where the temperature changes a lot. ?Keep all follow-up  visits as told by your doctor. This is important. ?Contact a doctor if: ?A joint hurts. ?Your feet or hands hurt or swell. ?You feel tired (fatigued). ?Get help right away if: ?You have symptoms of infection. These include: ?Fever. ?Chills. ?Being very tired. ?Irritability. ?Poor eating. ?Throwing up (vomiting). ?You feel dizzy or faint. ?You have new stomach pain, especially on the left side. ?You have a an erection (priapism) that lasts more than 4 hours. ?You have numbness in your arms or legs. ?You have a hard time moving your arms or legs. ?You have trouble talking. ?You have pain that does not go away when you take medicine. ?You are short of breath. ?You are breathing fast. ?You have a long-term cough. ?You have pain in your chest. ?You have a bad headache. ?You have a stiff neck. ?Your stomach looks bloated even though you did not eat much. ?Your skin is pale. ?You suddenly cannot see well. ?Summary ?Sickle cell anemia is a condition where your red blood cells are shaped like sickles. ?Follow your doctor's advice on ways to manage pain, food to eat, activities to do, and steps to take for safe travel. ?Get medical help right away if you have any signs of infection, such as a fever. ?This information is not intended to replace advice given to you by your health care provider. Make sure you discuss any questions you have with your health care provider. ?Document Revised: 06/26/2019 Document Reviewed: 06/26/2019 ?Elsevier Patient Education ? 2022 Elsevier Inc. ? ?

## 2021-05-04 NOTE — Telephone Encounter (Signed)
Needs oxycodone 15 mg & the pain meds that you prescribed him today (he could not remember the name) [facility] [address] because [facility] [address] [facility] are out of [facility] meds and will be out of stock for a month.  ?

## 2021-05-04 NOTE — Telephone Encounter (Signed)
The refill has been sent. Thanks  ° °

## 2021-05-04 NOTE — Telephone Encounter (Signed)
Oxycodone  °

## 2021-05-05 ENCOUNTER — Other Ambulatory Visit: Payer: Self-pay | Admitting: Nurse Practitioner

## 2021-05-05 ENCOUNTER — Ambulatory Visit: Payer: Self-pay | Admitting: Nurse Practitioner

## 2021-05-05 DIAGNOSIS — G894 Chronic pain syndrome: Secondary | ICD-10-CM

## 2021-05-05 MED ORDER — DULOXETINE HCL 30 MG PO CPEP: 30.0000 mg | ORAL_CAPSULE | Freq: Every day | ORAL | 1 refills | Status: DC

## 2021-05-05 MED ORDER — OXYCODONE HCL 15 MG PO TABS: 15.0000 mg | ORAL_TABLET | ORAL | 0 refills | Status: DC | PRN

## 2021-05-05 NOTE — Telephone Encounter (Signed)
Pt called in and said that can [facility] [address]? ? ?[facility] [address] [facility]  ?

## 2021-05-05 NOTE — Telephone Encounter (Signed)
Your refill request has been updated.  ? ?

## 2021-05-06 ENCOUNTER — Other Ambulatory Visit: Payer: Self-pay | Admitting: Nurse Practitioner

## 2021-05-06 DIAGNOSIS — G894 Chronic pain syndrome: Secondary | ICD-10-CM

## 2021-05-06 NOTE — Telephone Encounter (Signed)
The refill has been sent. Thanks  ° °

## 2021-05-17 ENCOUNTER — Telehealth: Payer: Self-pay

## 2021-05-17 NOTE — Telephone Encounter (Signed)
Oxycodone  °

## 2021-05-19 ENCOUNTER — Other Ambulatory Visit: Payer: Self-pay | Admitting: Internal Medicine

## 2021-05-19 DIAGNOSIS — G894 Chronic pain syndrome: Secondary | ICD-10-CM

## 2021-05-19 MED ORDER — OXYCODONE HCL 15 MG PO TABS: 15.0000 mg | ORAL_TABLET | ORAL | 0 refills | Status: DC | PRN

## 2021-06-01 ENCOUNTER — Ambulatory Visit: Payer: Medicaid Other | Admitting: Cardiology

## 2021-06-03 ENCOUNTER — Telehealth: Payer: Self-pay

## 2021-06-03 NOTE — Telephone Encounter (Signed)
Oxycodone  °

## 2021-06-15 ENCOUNTER — Encounter: Payer: Self-pay | Admitting: Nurse Practitioner

## 2021-06-15 ENCOUNTER — Ambulatory Visit (INDEPENDENT_AMBULATORY_CARE_PROVIDER_SITE_OTHER): Payer: Medicaid Other | Admitting: Nurse Practitioner

## 2021-06-15 VITALS — BP 169/119 | HR 106 | Temp 97.6°F | Ht 67.0 in | Wt 198.0 lb

## 2021-06-15 DIAGNOSIS — R000 Tachycardia, unspecified: Secondary | ICD-10-CM

## 2021-06-15 DIAGNOSIS — R52 Pain, unspecified: Secondary | ICD-10-CM | POA: Diagnosis not present

## 2021-06-15 DIAGNOSIS — J45909 Unspecified asthma, uncomplicated: Secondary | ICD-10-CM | POA: Insufficient documentation

## 2021-06-15 DIAGNOSIS — D571 Sickle-cell disease without crisis: Secondary | ICD-10-CM

## 2021-06-15 DIAGNOSIS — G894 Chronic pain syndrome: Secondary | ICD-10-CM

## 2021-06-15 DIAGNOSIS — I10 Essential (primary) hypertension: Secondary | ICD-10-CM | POA: Diagnosis not present

## 2021-06-15 DIAGNOSIS — D57219 Sickle-cell/Hb-C disease with crisis, unspecified: Secondary | ICD-10-CM | POA: Insufficient documentation

## 2021-06-15 DIAGNOSIS — J302 Other seasonal allergic rhinitis: Secondary | ICD-10-CM | POA: Insufficient documentation

## 2021-06-15 DIAGNOSIS — E663 Overweight: Secondary | ICD-10-CM | POA: Insufficient documentation

## 2021-06-15 MED ORDER — IBUPROFEN 800 MG PO TABS: 800.0000 mg | ORAL_TABLET | Freq: Three times a day (TID) | ORAL | 0 refills | Status: DC | PRN

## 2021-06-15 MED ORDER — OXYCODONE HCL 30 MG PO TABS: ORAL_TABLET | ORAL | 0 refills | Status: DC

## 2021-06-15 MED ORDER — OMRON 3 SERIES BP MONITOR DEVI: 1.0000 | Freq: Every day | 0 refills | Status: DC

## 2021-06-15 MED ORDER — AMLODIPINE BESYLATE 10 MG PO TABS: 10.0000 mg | ORAL_TABLET | Freq: Every day | ORAL | 0 refills | Status: DC

## 2021-06-15 MED ORDER — METOPROLOL SUCCINATE ER 25 MG PO TB24: 25.0000 mg | ORAL_TABLET | Freq: Every day | ORAL | 3 refills | Status: DC

## 2021-06-15 MED ORDER — LOSARTAN POTASSIUM-HCTZ 100-25 MG PO TABS
1.0000 | ORAL_TABLET | Freq: Every day | ORAL | 0 refills | Status: DC
Start: 2021-06-15 — End: 2022-04-17

## 2021-06-15 NOTE — Patient Instructions (Signed)
You were seen today in the [facility] for reevaluation of sickle cell and HTN. You were prescribed medications, please take as directed. Please follow up in 1 mths for reevaluation HTN ?

## 2021-06-15 NOTE — Progress Notes (Signed)
? ?[facility] ?[place], [place]  [address] ?Phone:  [phone]   Fax:  [phone] ?Subjective:  ? Patient ID: Dean Webb, male    DOB: 1975-11-12, 46 y.o.   MRN: [id number] ? ?Chief Complaint  ?Patient presents with  ? Follow-up  ?  Patient is here today for his 6 week follow up to discuss medication adjustments assciated with his elevated blood pressure. ?Patient also needs refills on his pain medications.  ? ?HPI ?Dean Webb 46 y.o. 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 To the [facility] for follow up for sickle cell and HTN. ? ?States that he currently works in [employer] and considers it a very stressful job. Denies monitoring meals and exercising regularly. Patient states that his job causes his pain to increase and requesting possible increase in dosage of pain medications. States that he was on increased dose in the past and morphine, but requested they be discontinued due to decrease in pain. Pain is currently the most pronounced in the lower back, denies any pain during visit.  ? ?Smokes marijuana occasionally, but denies any other drug usage. Denies any fatigue, chest pain, shortness of breath, HA or dizziness. Denies any blurred vision, numbness or tingling. ? ?Past Medical History:  ?Diagnosis Date  ? Asthma   ? GERD (gastroesophageal reflux disease)   ? 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 HTN (hypertension) 2008  ? Hypertension   ? Sickle cell anemia (HCC)   ? Sickle cell disease ([place])   ? ? ?Past Surgical History:  ?Procedure Laterality Date  ? CHOLECYSTECTOMY    ? ? ?Family History  ?Problem Relation Age of Onset  ? Sickle cell trait Mother   ? Hypertension Mother   ? Asthma Mother   ? Sickle cell trait Father   ? Congestive Heart Failure Father   ? Hypertension Sister   ? Sickle  cell anemia Daughter   ? Hypertension Daughter   ? Diabetes Daughter   ? ? ?Social History  ? ?Socioeconomic History  ? Marital status: Single  ?  Spouse name: Not on file  ? Number of children: Not on file  ? Years of education: Not on file  ? Highest education level: Not on file  ?Occupational History  ? Not on file  ?Tobacco Use  ? Smoking status: Every Day  ?  Packs/day: 0.50  ?  Years: 17.00  ?  Pack years: 8.50  ?  Types: Cigarettes  ? Smokeless tobacco: Never  ?Vaping Use  ? Vaping Use: Never used  ?Substance and Sexual Activity  ? Alcohol use: Yes  ?  Comment: Occasional drinker  ? Drug use: Not Currently  ?  Types: Marijuana  ? Sexual activity: Yes  ?Other Topics Concern  ? Not on file  ?Social History Narrative  ? Not on file  ? ?Social Determinants of Health  ? ?Financial Resource Strain: Not on file  ?Food Insecurity: Not on file  ?Transportation Needs: Not on file  ?Physical Activity: Not on file  ?Stress: Not on file  ?Social Connections: Not on file  ?Intimate Partner Violence: Not on file  ? ? ?Outpatient Medications Prior to Visit  ?Medication Sig Dispense Refill  ? albuterol (VENTOLIN HFA) 108 (90 Base) MCG/ACT inhaler Inhale 2 puffs into the lungs every 6 (six) hours as needed for  wheezing or shortness of breath. 8 g 1  ? cetirizine (ZYRTEC) 10 MG tablet TAKE 1 TABLET BY MOUTH EVERY DAY AS NEEDED FOR ALLERGY 90 tablet 1  ? DULoxetine (CYMBALTA) 30 MG capsule Take 1 capsule (30 mg total) by mouth daily. 90 capsule 1  ? hydrOXYzine (VISTARIL) 25 MG capsule Take 1 capsule (25 mg total) by mouth every 8 (eight) hours as needed. 60 capsule 2  ? morphine (AVINZA) 60 MG 24 hr capsule 1 cap(s) orally once a day    ? pantoprazole (PROTONIX) 40 MG tablet Take 1 tablet (40 mg total) by mouth daily. 30 tablet 3  ? amLODipine (NORVASC) 10 MG tablet Take 1 tablet (10 mg total) by mouth daily. 90 tablet 3  ? hydrALAZINE (APRESOLINE) 50 MG tablet Take 1 tablet (50 mg total) by mouth 3 (three) times daily. 90  tablet 5  ? hydrochlorothiazide (HYDRODIURIL) 25 MG tablet 1 tab(s) orally Once daily for 30 days    ? losartan (COZAAR) 100 MG tablet Take 1 tablet (100 mg total) by mouth daily. 90 tablet 3  ? oxyCODONE (ROXICODONE) 15 MG immediate release tablet Take 15 mg by mouth every 4 (four) hours as needed.    ? oxycodone (ROXICODONE) 30 MG immediate release tablet 1 tab(s) orally every 6 hours    ? Blood Pressure Monitoring (OMRON 3 SERIES BP MONITOR) DEVI 1 kit by Does not apply route daily. (Patient not taking: Reported on 06/15/2021) 1 each 0  ? ?Facility-Administered Medications Prior to Visit  ?Medication Dose Route Frequency Provider Last Rate Last Admin  ? cloNIDine (CATAPRES) tablet 0.3 mg  0.3 mg Oral Once Vevelyn Francois, NP      ? ? ?No Known Allergies ? ?Review of Systems  ?Constitutional:  Negative for chills, fever and malaise/fatigue.  ?Respiratory:  Negative for cough and shortness of breath.   ?Cardiovascular:  Negative for chest pain, palpitations and leg swelling.  ?Gastrointestinal:  Negative for abdominal pain, blood in stool, constipation, diarrhea, nausea and vomiting.  ?Musculoskeletal:  Positive for back pain and joint pain. Negative for falls, myalgias and neck pain.  ?     See HPI  ?Skin: Negative.   ?Neurological: Negative.   ?Psychiatric/Behavioral:  Negative for depression. The patient is not nervous/anxious.   ?All other systems reviewed and are negative. ? ?   ?Objective:  ?  ?Physical Exam ?Constitutional:   ?   General: He is not in acute distress. ?   Appearance: Normal appearance. He is obese.  ?HENT:  ?   Head: Normocephalic.  ?Neck:  ?   Vascular: No carotid bruit.  ?Cardiovascular:  ?   Rate and Rhythm: Normal rate and regular rhythm.  ?   Pulses: Normal pulses.  ?   Heart sounds: Normal heart sounds.  ?   Comments: No obvious peripheral edema ?Pulmonary:  ?   Effort: Pulmonary effort is normal.  ?   Breath sounds: Normal breath sounds.  ?Musculoskeletal:     ?   General: No swelling,  tenderness, deformity or signs of injury. Normal range of motion.  ?   Cervical back: Normal range of motion and neck supple. No rigidity or tenderness.  ?   Right lower leg: No edema.  ?   Left lower leg: No edema.  ?Lymphadenopathy:  ?   Cervical: No cervical adenopathy.  ?Skin: ?   General: Skin is warm and dry.  ?   Capillary Refill: Capillary refill takes less than 2 seconds.  ?  Neurological:  ?   General: No focal deficit present.  ?   Mental Status: He is alert and oriented to person, place, and time.  ?Psychiatric:     ?   Mood and Affect: Mood normal.     ?   Behavior: Behavior normal.     ?   Thought Content: Thought content normal.     ?   Judgment: Judgment normal.  ? ? ?BP (!) 169/119   Pulse (!) 106   Temp 97.6 ?F (36.4 ?C)   Ht _0  (1.702 m)   Wt 198 lb (89.8 kg)   SpO2 100%   BMI 31.01 kg/m?  ?Wt Readings from Last 3 Encounters:  ?06/15/21 198 lb (89.8 kg)  ?05/04/21 198 lb (89.8 kg)  ?04/04/21 199 lb 6.4 oz (90.4 kg)  ? ? ?Immunization History  ?Administered Date(s) Administered  ? Influenza,inj,Quad PF,6+ Mos 03/09/2016, 10/25/2017, 01/17/2021  ? Pneumococcal Polysaccharide-23 03/09/2016  ? Tdap 08/03/2017  ? ? ?Diabetic Foot Exam - Simple   ?No data filed ?  ? ? ?No results found for: TSH ?Lab Results  ?Component Value Date  ? WBC 10.5 03/04/2021  ? HGB 15.0 03/04/2021  ? HCT 42.6 03/04/2021  ? MCV 87 03/04/2021  ? PLT 354 03/04/2021  ? ?Lab Results  ?Component Value Date  ? NA 139 03/04/2021  ? K 4.7 03/04/2021  ? CO2 23 03/04/2021  ? GLUCOSE 88 03/04/2021  ? BUN 9 03/04/2021  ? CREATININE 1.16 03/04/2021  ? BILITOT 1.2 03/04/2021  ? ALKPHOS 104 03/04/2021  ? AST 16 03/04/2021  ? ALT 17 03/04/2021  ? PROT 7.4 03/04/2021  ? ALBUMIN 4.7 03/04/2021  ? CALCIUM 11.6 (H) 03/04/2021  ? ANIONGAP 10 12/01/2019  ? EGFR 80 03/04/2021  ? ?Lab Results  ?Component Value Date  ? CHOL  11/06/2008  ?  84        ?ATP III CLASSIFICATION: ? <200     mg/dL   Desirable ? 200-239  mg/dL   Borderline High ? >=240     mg/dL   High ?        ? ?Lab Results  ?Component Value Date  ? HDL 31 (L) 11/06/2008  ? ?Lab Results  ?Component Value Date  ? [place]  11/06/2008  ?  44        ?Total Cholesterol/HDL:CHD Risk ?Coronary Heart D

## 2021-06-17 ENCOUNTER — Other Ambulatory Visit: Payer: Self-pay | Admitting: Nurse Practitioner

## 2021-06-17 ENCOUNTER — Other Ambulatory Visit: Payer: Self-pay

## 2021-06-17 DIAGNOSIS — D571 Sickle-cell disease without crisis: Secondary | ICD-10-CM

## 2021-06-17 DIAGNOSIS — R52 Pain, unspecified: Secondary | ICD-10-CM

## 2021-06-17 DIAGNOSIS — G894 Chronic pain syndrome: Secondary | ICD-10-CM

## 2021-06-17 MED ORDER — OXYCODONE HCL 30 MG PO TABS: ORAL_TABLET | ORAL | 0 refills | Status: DC

## 2021-06-19 ENCOUNTER — Ambulatory Visit (HOSPITAL_BASED_OUTPATIENT_CLINIC_OR_DEPARTMENT_OTHER): Payer: Medicaid Other | Admitting: Internal Medicine

## 2021-06-20 ENCOUNTER — Other Ambulatory Visit: Payer: Self-pay | Admitting: Nurse Practitioner

## 2021-06-20 DIAGNOSIS — G894 Chronic pain syndrome: Secondary | ICD-10-CM

## 2021-06-20 DIAGNOSIS — D571 Sickle-cell disease without crisis: Secondary | ICD-10-CM

## 2021-06-20 DIAGNOSIS — R52 Pain, unspecified: Secondary | ICD-10-CM

## 2021-06-20 MED ORDER — OXYCODONE HCL 30 MG PO TABS: ORAL_TABLET | ORAL | 0 refills | Status: AC

## 2021-06-22 ENCOUNTER — Other Ambulatory Visit: Payer: Self-pay | Admitting: Family Medicine

## 2021-06-29 ENCOUNTER — Ambulatory Visit (HOSPITAL_BASED_OUTPATIENT_CLINIC_OR_DEPARTMENT_OTHER): Payer: Medicaid Other | Admitting: Internal Medicine

## 2021-06-30 ENCOUNTER — Telehealth: Payer: Self-pay

## 2021-06-30 ENCOUNTER — Ambulatory Visit (HOSPITAL_BASED_OUTPATIENT_CLINIC_OR_DEPARTMENT_OTHER): Payer: Medicaid Other | Attending: Nurse Practitioner | Admitting: Internal Medicine

## 2021-06-30 NOTE — Telephone Encounter (Signed)
Oxycodone  °

## 2021-07-06 ENCOUNTER — Other Ambulatory Visit: Payer: Self-pay | Admitting: Nurse Practitioner

## 2021-07-06 DIAGNOSIS — G894 Chronic pain syndrome: Secondary | ICD-10-CM

## 2021-07-06 MED ORDER — OXYCODONE HCL 15 MG PO TABS: 15.0000 mg | ORAL_TABLET | ORAL | 0 refills | Status: DC | PRN

## 2021-07-13 ENCOUNTER — Telehealth: Payer: Self-pay | Admitting: Nurse Practitioner

## 2021-07-13 NOTE — Telephone Encounter (Signed)
Oxycodone refill request.

## 2021-07-18 ENCOUNTER — Ambulatory Visit (INDEPENDENT_AMBULATORY_CARE_PROVIDER_SITE_OTHER): Payer: Medicaid Other | Admitting: Nurse Practitioner

## 2021-07-18 ENCOUNTER — Encounter: Payer: Self-pay | Admitting: Nurse Practitioner

## 2021-07-18 VITALS — BP 159/118 | HR 83 | Temp 98.1°F | Ht 67.0 in | Wt 199.6 lb

## 2021-07-18 DIAGNOSIS — I10 Essential (primary) hypertension: Secondary | ICD-10-CM

## 2021-07-18 DIAGNOSIS — G894 Chronic pain syndrome: Secondary | ICD-10-CM | POA: Diagnosis not present

## 2021-07-18 MED ORDER — CLONIDINE HCL 0.1 MG PO TABS: 0.1000 mg | ORAL_TABLET | Freq: Two times a day (BID) | ORAL | 0 refills | Status: DC

## 2021-07-18 MED ORDER — OXYCODONE HCL 15 MG PO TABS: 15.0000 mg | ORAL_TABLET | ORAL | 0 refills | Status: DC | PRN

## 2021-07-18 MED ORDER — OMRON 3 SERIES BP MONITOR DEVI: 1.0000 | Freq: Two times a day (BID) | 0 refills | Status: DC

## 2021-07-18 NOTE — Patient Instructions (Signed)
You were seen today in the [facility] for reevaluation of HTN.  You were prescribed medications, please take as directed. Please follow up in 2 mths for reevaluation of sickle cell

## 2021-07-18 NOTE — Progress Notes (Unsigned)
[place], [place]  [address] Phone:  [phone]   Fax:  [phone] Subjective:   Patient ID: Dean Webb, male    DOB: [birth year], 46 y.o.   MRN: [id number]  Chief Complaint  Patient presents with   Follow-up    Pt is here for 1 month BP follow up. Pt is requesting for prescription to be resent to pharmacy for Blood Pressure Monitoring (OMRON 3 SERIES BP MONITOR) also requesting refill on Iibuprofen , oxyCODONE, cetirizine,   HPI Luqman Perrelli Spaid 46 y.o. 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 To the [facility] for reevaluation of HTN.  Hypertension: Patient here for follow-up of elevated blood pressure. He is exercising and is adherent to low salt diet.  Patient as not been able to begin checking B/P at home, requesting order for B/P machine. Cardiac symptoms none. Patient denies chest pain, claudication, fatigue, and near-syncope.  Cardiovascular risk factors: hypertension, male gender, and obesity (BMI >= 30 kg/m2). Use of agents associated with hypertension: none. History of target organ damage: none.   Denies any other concerns today. Denies any fatigue, chest pain, shortness of breath, HA or dizziness. Denies any blurred vision, numbness or tingling.   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   HTN (hypertension) 2008   Hypertension    Sickle cell anemia (HCC)    Sickle cell disease ([place])     Past Surgical History:  Procedure Laterality Date   CHOLECYSTECTOMY      Family History  Problem Relation Age of Onset   Sickle cell trait Mother    Hypertension Mother    Asthma Mother    Sickle cell trait Father    Congestive Heart Failure Father    Hypertension Sister     Sickle cell anemia Daughter    Hypertension Daughter    Diabetes Daughter     Social History   Socioeconomic History   Marital status: Single    Spouse name: Not on file   Number of children: Not on file   Years of education: Not on file   Highest education level: Not on file  Occupational History   Not on file  Tobacco Use   Smoking status: Every Day    Packs/day: 0.50    Years: 17.00    Pack years: 8.50    Types: Cigarettes   Smokeless tobacco: Never  Vaping Use   Vaping Use: Never used  Substance and Sexual Activity   Alcohol use: Yes    Comment: Occasional drinker   Drug use: Not Currently    Types: Marijuana   Sexual activity: Yes  Other Topics Concern   Not on file  Social History Narrative   Not on file   Social Determinants of Health   Financial Resource Strain: Not on file  Food Insecurity: Not on file  Transportation Needs: Not on file  Physical Activity: Not on file  Stress: Not on file  Social Connections: Not on file  Intimate Partner Violence: Not on file    Outpatient Medications Prior to Visit  Medication Sig Dispense Refill   albuterol (VENTOLIN HFA) 108 (90 Base) MCG/ACT inhaler Inhale 2 puffs into the lungs every 6 (six) hours as needed for wheezing or  shortness of breath. 8 g 1   amLODipine (NORVASC) 10 MG tablet Take 1 tablet (10 mg total) by mouth daily. 90 tablet 0   cetirizine (ZYRTEC) 10 MG tablet TAKE 1 TABLET BY MOUTH EVERY DAY AS NEEDED FOR ALLERGY 90 tablet 1   DULoxetine (CYMBALTA) 30 MG capsule Take 1 capsule (30 mg total) by mouth daily. 90 capsule 1   ibuprofen (ADVIL) 800 MG tablet Take 1 tablet (800 mg total) by mouth every 8 (eight) hours as needed. 30 tablet 0   losartan-hydrochlorothiazide (HYZAAR) 100-25 MG tablet Take 1 tablet by mouth daily. 90 tablet 0   metoprolol succinate (TOPROL-XL) 25 MG 24 hr tablet Take 1 tablet (25 mg total) by mouth daily. 90 tablet 3   morphine (AVINZA) 60 MG 24 hr capsule 1 cap(s) orally once  a day     pantoprazole (PROTONIX) 40 MG tablet Take 1 tablet (40 mg total) by mouth daily. 30 tablet 3   oxyCODONE (ROXICODONE) 15 MG immediate release tablet Take 1 tablet (15 mg total) by mouth every 4 (four) hours as needed for up to 15 days for pain. 60 tablet 0   Blood Pressure Monitoring (OMRON 3 SERIES BP MONITOR) DEVI 1 kit by Does not apply route daily. (Patient not taking: Reported on 07/18/2021) 1 each 0   Facility-Administered Medications Prior to Visit  Medication Dose Route Frequency Provider Last Rate Last Admin   cloNIDine (CATAPRES) tablet 0.3 mg  0.3 mg Oral Once Vevelyn Francois, NP        No Known Allergies  Review of Systems  Constitutional:  Negative for chills, fever and malaise/fatigue.  Respiratory:  Negative for cough and shortness of breath.   Cardiovascular:  Negative for chest pain, palpitations and leg swelling.  Gastrointestinal:  Negative for abdominal pain, blood in stool, constipation, diarrhea, nausea and vomiting.  Skin: Negative.   Neurological: Negative.   Psychiatric/Behavioral:  Negative for depression. The patient is not nervous/anxious.   All other systems reviewed and are negative.     Objective:    Physical Exam Constitutional:      General: He is not in acute distress.    Appearance: Normal appearance. He is obese.  HENT:     Head: Normocephalic.  Neck:     Vascular: No carotid bruit.  Cardiovascular:     Rate and Rhythm: Normal rate and regular rhythm.     Pulses: Normal pulses.     Heart sounds: Normal heart sounds.     Comments: No obvious peripheral edema Pulmonary:     Effort: Pulmonary effort is normal.     Breath sounds: Normal breath sounds.  Musculoskeletal:        General: No swelling, tenderness, deformity or signs of injury. Normal range of motion.     Cervical back: Normal range of motion and neck supple. No rigidity or tenderness.     Right lower leg: No edema.     Left lower leg: No edema.  Lymphadenopathy:      Cervical: No cervical adenopathy.  Skin:    General: Skin is warm and dry.     Capillary Refill: Capillary refill takes less than 2 seconds.  Neurological:     General: No focal deficit present.     Mental Status: He is alert and oriented to person, place, and time.  Psychiatric:        Mood and Affect: Mood normal.        Behavior: Behavior normal.  Thought Content: Thought content normal.        Judgment: Judgment normal.    BP (!) 159/118 (BP Location: Left Arm, Patient Position: Sitting, Cuff Size: Large)   Pulse 83   Temp 98.1 F (36.7 C)   Ht $R'5\' 7"'PE$  (1.702 m)   Wt 199 lb 9.6 oz (90.5 kg)   SpO2 100%   BMI 31.26 kg/m  Wt Readings from Last 3 Encounters:  07/18/21 199 lb 9.6 oz (90.5 kg)  06/15/21 198 lb (89.8 kg)  05/04/21 198 lb (89.8 kg)    Immunization History  Administered Date(s) Administered   Influenza,inj,Quad PF,6+ Mos 03/09/2016, 10/25/2017, 01/17/2021   Pneumococcal Polysaccharide-23 03/09/2016   Tdap 08/03/2017    Diabetic Foot Exam - Simple   No data filed     No results found for: TSH Lab Results  Component Value Date   WBC 10.5 03/04/2021   HGB 15.0 03/04/2021   HCT 42.6 03/04/2021   MCV 87 03/04/2021   PLT 354 03/04/2021   Lab Results  Component Value Date   NA 139 03/04/2021   K 4.7 03/04/2021   CO2 23 03/04/2021   GLUCOSE 88 03/04/2021   BUN 9 03/04/2021   CREATININE 1.16 03/04/2021   BILITOT 1.2 03/04/2021   ALKPHOS 104 03/04/2021   AST 16 03/04/2021   ALT 17 03/04/2021   PROT 7.4 03/04/2021   ALBUMIN 4.7 03/04/2021   CALCIUM 11.6 (H) 03/04/2021   ANIONGAP 10 12/01/2019   EGFR 80 03/04/2021   Lab Results  Component Value Date   CHOL  11/06/2008    84        ATP III CLASSIFICATION:  <200     mg/dL   Desirable  200-239  mg/dL   Borderline High  >=240    mg/dL   High          Lab Results  Component Value Date   HDL 31 (L) 11/06/2008   Lab Results  Component Value Date   [facility]  11/06/2008    44         Total Cholesterol/HDL:CHD Risk Coronary Heart Disease Risk Table                     Men   Women  1/2 Average Risk   3.4   3.3  Average Risk       5.0   4.4  2 X Average Risk   9.6   7.1  3 X Average Risk  23.4   11.0        Use the calculated Patient Ratio above and the CHD Risk Table to determine the patient's CHD Risk.        ATP III CLASSIFICATION (LDL):  <100     mg/dL   Optimal  100-129  mg/dL   Near or Above                    Optimal  130-159  mg/dL   Borderline  160-189  mg/dL   High  >190     mg/dL   Very High   Lab Results  Component Value Date   TRIG 44 11/06/2008   Lab Results  Component Value Date   CHOLHDL 2.7 11/06/2008   No results found for: HGBA1C     Assessment & Plan:   Problem List Items Addressed This Visit       Cardiovascular and Mediastinum   Essential hypertension - Primary (Chronic)   Relevant Medications  Blood Pressure Monitoring (OMRON 3 SERIES BP MONITOR) DEVI   cloNIDine (CATAPRES) 0.1 MG tablet, add to treatment plan today   Other Relevant Orders   Ambulatory referral to Cardiology Encouraged continued diet and exercise efforts  Encouraged continued compliance with medication       Other   Chronic pain syndrome   Relevant Medications   oxyCODONE (ROXICODONE) 30 MG immediate release tablet, refilled without change Encouraged to continue conservative methods for management of pain   Follow up in 2 mths for reevaluation of sickle cell, sooner as needed     I have discontinued Leanor Kail. Gellerman's oxyCODONE. I have also changed his oxycodone. Additionally, I am having him start on Omron 3 Series BP Monitor and cloNIDine. Lastly, I am having him maintain his albuterol, pantoprazole, cetirizine, DULoxetine, morphine, losartan-hydrochlorothiazide, amLODipine, metoprolol succinate, ibuprofen, and Omron 3 Series BP Monitor. We will continue to administer cloNIDine.  Meds ordered this encounter  Medications   Blood Pressure  Monitoring (OMRON 3 SERIES BP MONITOR) DEVI    Sig: 1 each by Does not apply route in the morning and at bedtime.    Dispense:  1 each    Refill:  0   DISCONTD: oxyCODONE (ROXICODONE) 15 MG immediate release tablet    Sig: Take 1 tablet (15 mg total) by mouth every 4 (four) hours as needed for up to 15 days for pain.    Dispense:  60 tablet    Refill:  0   cloNIDine (CATAPRES) 0.1 MG tablet    Sig: Take 1 tablet (0.1 mg total) by mouth 2 (two) times daily.    Dispense:  180 tablet    Refill:  0   oxyCODONE (ROXICODONE) 30 MG immediate release tablet    Sig: Take 1 tablet (30 mg total) by mouth every 6 (six) hours as needed for up to 15 days for pain.    Dispense:  60 tablet    Refill:  0     Teena Dunk, NP

## 2021-07-19 ENCOUNTER — Telehealth: Payer: Self-pay

## 2021-07-19 MED ORDER — OXYCODONE HCL 30 MG PO TABS: 30.0000 mg | ORAL_TABLET | Freq: Four times a day (QID) | ORAL | 0 refills | Status: AC | PRN

## 2021-07-27 ENCOUNTER — Ambulatory Visit: Payer: Medicaid Other | Admitting: Internal Medicine

## 2021-07-27 NOTE — Progress Notes (Deleted)
Cardiology Office Note:    Date:  07/27/2021   ID:  Dean Webb, DOB [birth year], MRN [id number]  PCP:  Kathrynn Speed, NP   [facility] Providers Cardiologist:  None { Click to update primary MD,subspecialty MD or APP then REFRESH:1}    Referring MD: Kathrynn Speed, NP   No chief complaint on file. HTN  History of Present Illness:    Dean Webb is a 46 y.o. male with a hx of asthma, GERD, sickle cell disease, who was referred for HTN. He was seen by his primary provide [employer]. His Bps are 150s-170s/up to 120s  Past Medical History:  Diagnosis Date   Asthma    GERD (gastroesophageal reflux disease)    Hb-S/Hb-C disease (HCC) 1979   HTN (hypertension) 2008   Hypertension    Sickle cell anemia (HCC)    Sickle cell disease (HCC)     Past Surgical History:  Procedure Laterality Date   CHOLECYSTECTOMY      Current Medications: No outpatient medications have been marked as taking for the 07/27/21 encounter (Appointment) with Maisie Fus, MD.   Current Facility-Administered Medications for the 07/27/21 encounter (Appointment) with Maisie Fus, MD  Medication   cloNIDine (CATAPRES) tablet 0.3 mg     Allergies:   Patient has no known allergies.   Social History   Socioeconomic History   Marital status: Single    Spouse name: Not on file   Number of children: Not on file   Years of education: Not on file   Highest education level: Not on file  Occupational History   Not on file  Tobacco Use   Smoking status: Every Day    Packs/day: 0.50    Years: 17.00    Total pack years: 8.50    Types: Cigarettes   Smokeless tobacco: Never  Vaping Use   Vaping Use: Never used  Substance and Sexual Activity   Alcohol use: Yes    Comment: Occasional drinker   Drug use: Not Currently    Types: Marijuana   Sexual activity: Yes  Other Topics Concern   Not on file  Social History Narrative   Not on file   Social Determinants of Health    Financial Resource Strain: Not on file  Food Insecurity: Not on file  Transportation Needs: Not on file  Physical Activity: Not on file  Stress: Not on file  Social Connections: Not on file     Family History: The patient's ***family history includes Asthma in his mother; Congestive Heart Failure in his father; Diabetes in his daughter; Hypertension in his daughter, mother, and sister; Sickle cell anemia in his daughter; Sickle cell trait in his father and mother.  ROS:   Please see the history of present illness.    *** All other systems reviewed and are negative.  EKGs/Labs/Other Studies Reviewed:    The following studies were reviewed today: ***  EKG:  EKG is *** ordered today.  The ekg ordered today demonstrates ***  Recent Labs: 03/04/2021: ALT 17; BUN 9; Creatinine, Ser 1.16; Hemoglobin 15.0; Platelets 354; Potassium 4.7; Sodium 139  Recent Lipid Panel    Component Value Date/Time   CHOL  11/06/2008 0610    84        ATP III CLASSIFICATION:  <200     mg/dL   Desirable  371-062  mg/dL   Borderline High  >=694    mg/dL   High  TRIG 44 11/06/2008 0610   HDL 31 (L) 11/06/2008 0610   CHOLHDL 2.7 11/06/2008 0610   VLDL 9 11/06/2008 0610   LDLCALC  11/06/2008 0610    44        Total Cholesterol/HDL:CHD Risk Coronary Heart Disease Risk Table                     Men   Women  1/2 Average Risk   3.4   3.3  Average Risk       5.0   4.4  2 X Average Risk   9.6   7.1  3 X Average Risk  23.4   11.0        Use the calculated Patient Ratio above and the CHD Risk Table to determine the patient's CHD Risk.        ATP III CLASSIFICATION (LDL):  <100     mg/dL   Optimal  938-182  mg/dL   Near or Above                    Optimal  130-159  mg/dL   Borderline  993-716  mg/dL   High  >967     mg/dL   Very High     Risk Assessment/Calculations:   {Does this patient have ATRIAL FIBRILLATION?:[phone]}       Physical Exam:    VS:  There were no vitals taken  for this visit.    Wt Readings from Last 3 Encounters:  07/18/21 199 lb 9.6 oz (90.5 kg)  06/15/21 198 lb (89.8 kg)  05/04/21 198 lb (89.8 kg)     GEN: *** Well nourished, well developed in no acute distress HEENT: Normal NECK: No JVD; No carotid bruits LYMPHATICS: No lymphadenopathy CARDIAC: ***RRR, no murmurs, rubs, gallops RESPIRATORY:  Clear to auscultation without rales, wheezing or rhonchi  ABDOMEN: Soft, non-tender, non-distended MUSCULOSKELETAL:  No edema; No deformity  SKIN: Warm and dry NEUROLOGIC:  Alert and oriented x 3 PSYCHIATRIC:  Normal affect   ASSESSMENT:    Resistant Hypertension: - stop clonidine, low dose - continue norvasc 10 mg daily - stop losartan-HCTz; start olmesartan 40 mg daily - stop metoprolol 25 XL; start nebivolol 10 mg daily - start chlorthalidone 25 mg daily   PLAN:    In order of problems listed above:  Sleep study Renal duplex Pharmacy visit 3 months Follow up in 6 months      {Are you ordering a CV Procedure (e.g. stress test, cath, DCCV, TEE, etc)?   Press F2        :[id number]}    Medication Adjustments/Labs and Tests Ordered: Current medicines are reviewed at length with the patient today.  Concerns regarding medicines are outlined above.  No orders of the defined types were placed in this encounter.  No orders of the defined types were placed in this encounter.   There are no Patient Instructions on file for this visit.   Signed, Maisie Fus, MD  07/27/2021 12:42 PM    [facility]

## 2021-07-28 ENCOUNTER — Telehealth: Payer: Self-pay

## 2021-07-28 NOTE — Telephone Encounter (Signed)
Oxycodone HCL 30mg  Tablets   Prior authorization was started on 07/28/21.

## 2021-08-08 ENCOUNTER — Other Ambulatory Visit: Payer: Self-pay | Admitting: Family Medicine

## 2021-08-08 ENCOUNTER — Telehealth: Payer: Self-pay | Admitting: Nurse Practitioner

## 2021-08-08 DIAGNOSIS — G894 Chronic pain syndrome: Secondary | ICD-10-CM

## 2021-08-08 DIAGNOSIS — D57219 Sickle-cell/Hb-C disease with crisis, unspecified: Secondary | ICD-10-CM

## 2021-08-08 MED ORDER — OXYCODONE HCL 30 MG PO TABS: 30.0000 mg | ORAL_TABLET | Freq: Four times a day (QID) | ORAL | 0 refills | Status: DC | PRN

## 2021-08-08 NOTE — Telephone Encounter (Signed)
Oxycodone refill request.

## 2021-08-09 ENCOUNTER — Other Ambulatory Visit: Payer: Self-pay | Admitting: Nurse Practitioner

## 2021-08-09 DIAGNOSIS — D57219 Sickle-cell/Hb-C disease with crisis, unspecified: Secondary | ICD-10-CM

## 2021-08-09 DIAGNOSIS — G894 Chronic pain syndrome: Secondary | ICD-10-CM

## 2021-08-09 MED ORDER — OXYCODONE HCL 30 MG PO TABS: 30.0000 mg | ORAL_TABLET | Freq: Four times a day (QID) | ORAL | 0 refills | Status: DC | PRN

## 2021-08-11 ENCOUNTER — Ambulatory Visit: Payer: Self-pay

## 2021-08-11 ENCOUNTER — Telehealth: Payer: Self-pay | Admitting: Pharmacist

## 2021-08-11 NOTE — Telephone Encounter (Signed)
Contacted patient for scheduled appointment. Left voicemail for him to return my call at his convenience.   Catie Eppie Gibson, PharmD, [facility] [phone]

## 2021-08-19 ENCOUNTER — Encounter: Payer: Self-pay | Admitting: *Deleted

## 2021-08-22 ENCOUNTER — Other Ambulatory Visit: Payer: Self-pay | Admitting: Nurse Practitioner

## 2021-08-22 ENCOUNTER — Telehealth: Payer: Self-pay | Admitting: Family Medicine

## 2021-08-22 ENCOUNTER — Telehealth: Payer: Self-pay

## 2021-08-22 DIAGNOSIS — I10 Essential (primary) hypertension: Secondary | ICD-10-CM

## 2021-08-22 NOTE — Telephone Encounter (Signed)
Oxycodone  °

## 2021-08-22 NOTE — Telephone Encounter (Signed)
Error

## 2021-08-22 NOTE — Telephone Encounter (Signed)
Clonidine refill request Pt states he dropped his whole bottle in the toilet

## 2021-08-23 ENCOUNTER — Other Ambulatory Visit: Payer: Self-pay | Admitting: Family Medicine

## 2021-08-23 DIAGNOSIS — D57219 Sickle-cell/Hb-C disease with crisis, unspecified: Secondary | ICD-10-CM

## 2021-08-23 DIAGNOSIS — I10 Essential (primary) hypertension: Secondary | ICD-10-CM

## 2021-08-23 DIAGNOSIS — G894 Chronic pain syndrome: Secondary | ICD-10-CM

## 2021-08-23 MED ORDER — OXYCODONE HCL 30 MG PO TABS: 30.0000 mg | ORAL_TABLET | Freq: Four times a day (QID) | ORAL | 0 refills | Status: DC | PRN

## 2021-08-23 MED ORDER — CLONIDINE HCL 0.1 MG PO TABS: 0.1000 mg | ORAL_TABLET | Freq: Two times a day (BID) | ORAL | 0 refills | Status: DC

## 2021-08-23 NOTE — Progress Notes (Signed)
Reviewed PDMP substance reporting system prior to prescribing opiate medications. No inconsistencies noted.  Meds ordered this encounter  Medications   oxycodone (ROXICODONE) 30 MG immediate release tablet    Sig: Take 1 tablet (30 mg total) by mouth every 6 (six) hours as needed for pain.    Dispense:  60 tablet    Refill:  0    Order Specific Question:   Supervising Provider    Answer:   Quentin Angst [[id number]]   cloNIDine (CATAPRES) 0.1 MG tablet    Sig: Take 1 tablet (0.1 mg total) by mouth 2 (two) times daily.    Dispense:  180 tablet    Refill:  0    Order Specific Question:   Supervising Provider    Answer:   Quentin Angst [[id number]]   Nolon Nations  APRN, MSN, FNP-C Patient Care [facility] [address] [place], [place] [address] [phone]

## 2021-09-06 ENCOUNTER — Telehealth: Payer: Self-pay

## 2021-09-06 ENCOUNTER — Other Ambulatory Visit: Payer: Self-pay | Admitting: Family Medicine

## 2021-09-06 DIAGNOSIS — D57219 Sickle-cell/Hb-C disease with crisis, unspecified: Secondary | ICD-10-CM

## 2021-09-06 DIAGNOSIS — G894 Chronic pain syndrome: Secondary | ICD-10-CM

## 2021-09-06 NOTE — Telephone Encounter (Signed)
Refill request for oxycodone 30 mg.   Last refill sent on 08/23/2021

## 2021-09-06 NOTE — Telephone Encounter (Signed)
Oxycodone  °

## 2021-09-07 ENCOUNTER — Other Ambulatory Visit: Payer: Self-pay | Admitting: Family Medicine

## 2021-09-07 DIAGNOSIS — D57219 Sickle-cell/Hb-C disease with crisis, unspecified: Secondary | ICD-10-CM

## 2021-09-07 DIAGNOSIS — G894 Chronic pain syndrome: Secondary | ICD-10-CM

## 2021-09-07 MED ORDER — OXYCODONE HCL 30 MG PO TABS: 30.0000 mg | ORAL_TABLET | Freq: Four times a day (QID) | ORAL | 0 refills | Status: DC | PRN

## 2021-09-07 NOTE — Telephone Encounter (Signed)
Refill responded to by other means

## 2021-09-07 NOTE — Progress Notes (Signed)
Reviewed PDMP substance reporting system prior to prescribing opiate medications. No inconsistencies noted.    Meds ordered this encounter  Medications   oxycodone (ROXICODONE) 30 MG immediate release tablet    Sig: Take 1 tablet (30 mg total) by mouth every 6 (six) hours as needed for pain.    Dispense:  60 tablet    Refill:  0    Order Specific Question:   Supervising Provider    Answer:   JEGEDE, OLUGBEMIGA E [[id number]]     Dean Swanton Moore Herson Prichard  APRN, MSN, FNP-C [facility] [address]  [place], [place] [address] [phone]  

## 2021-09-08 ENCOUNTER — Other Ambulatory Visit: Payer: Self-pay | Admitting: Family Medicine

## 2021-09-08 ENCOUNTER — Telehealth: Payer: Self-pay

## 2021-09-08 DIAGNOSIS — D57219 Sickle-cell/Hb-C disease with crisis, unspecified: Secondary | ICD-10-CM

## 2021-09-08 DIAGNOSIS — G894 Chronic pain syndrome: Secondary | ICD-10-CM

## 2021-09-08 MED ORDER — OXYCODONE HCL 30 MG PO TABS: 30.0000 mg | ORAL_TABLET | Freq: Four times a day (QID) | ORAL | 0 refills | Status: DC | PRN

## 2021-09-08 NOTE — Telephone Encounter (Signed)
pt is here in lobby and the pharmacy said to him they didn't ENOUGH...  [facility] [address] [facility]

## 2021-09-08 NOTE — Progress Notes (Signed)
Pharmacy changed, due to out of stock medications.   Meds ordered this encounter  Medications   oxycodone (ROXICODONE) 30 MG immediate release tablet    Sig: Take 1 tablet (30 mg total) by mouth every 6 (six) hours as needed for pain.    Dispense:  60 tablet    Refill:  0    Order Specific Question:   Supervising Provider    Answer:   Quentin Angst [[id number]]      Nolon Nations  APRN, MSN, FNP-C Patient Care [facility] [address] [place], [place] [address] [phone]

## 2021-09-16 ENCOUNTER — Ambulatory Visit: Payer: Self-pay | Admitting: Nurse Practitioner

## 2021-09-19 ENCOUNTER — Ambulatory Visit: Payer: Medicaid Other | Admitting: Internal Medicine

## 2021-09-19 NOTE — Progress Notes (Deleted)
Cardiology Office Note:    Date:  09/19/2021   ID:  ORI TREJOS, DOB 03/06/1975, MRN [id number]  PCP:  Ivonne Andrew, NP   [facility] Providers Cardiologist:  None { Click to update primary MD,subspecialty MD or APP then REFRESH:1}    Referring MD: Kathrynn Speed, NP   No chief complaint on file. ***  History of Present Illness:    Dean Webb is a 46 y.o. male with a hx of OSA, sickle cell disease,  obesity, HTN, referral for resistant HTN  Dean Webb was seen by his primary provider and he was noted to have hypertension 159/118 mmHg. He was recommended to monitor his blood pressure at home. He was started on clonidine 0.1 mg BID. He is on norvasc 10 mg daily, metoprolol XL 25 mg daily, and losartan-HCTz 100-25 mg.  He has normal renal function.  Wear cpap?  A lot of pain?  Past Medical History:  Diagnosis Date   Asthma    GERD (gastroesophageal reflux disease)    Hb-S/Hb-C disease (HCC) 1979   HTN (hypertension) 2008   Hypertension    Sickle cell anemia (HCC)    Sickle cell disease (HCC)     Past Surgical History:  Procedure Laterality Date   CHOLECYSTECTOMY      Current Medications: No outpatient medications have been marked as taking for the 09/19/21 encounter (Appointment) with Maisie Fus, MD.   Current Facility-Administered Medications for the 09/19/21 encounter (Appointment) with Maisie Fus, MD  Medication   cloNIDine (CATAPRES) tablet 0.3 mg     Allergies:   Patient has no known allergies.   Social History   Socioeconomic History   Marital status: Single    Spouse name: Not on file   Number of children: Not on file   Years of education: Not on file   Highest education level: Not on file  Occupational History   Not on file  Tobacco Use   Smoking status: Every Day    Packs/day: 0.50    Years: 17.00    Total pack years: 8.50    Types: Cigarettes   Smokeless tobacco: Never  Vaping Use   Vaping Use: Never used   Substance and Sexual Activity   Alcohol use: Yes    Comment: Occasional drinker   Drug use: Not Currently    Types: Marijuana   Sexual activity: Yes  Other Topics Concern   Not on file  Social History Narrative   Not on file   Social Determinants of Health   Financial Resource Strain: Not on file  Food Insecurity: Not on file  Transportation Needs: Not on file  Physical Activity: Not on file  Stress: Not on file  Social Connections: Not on file     Family History: The patient's ***family history includes Asthma in his mother; Congestive Heart Failure in his father; Diabetes in his daughter; Hypertension in his daughter, mother, and sister; Sickle cell anemia in his daughter; Sickle cell trait in his father and mother.  ROS:   Please see the history of present illness.    *** All other systems reviewed and are negative.  EKGs/Labs/Other Studies Reviewed:    The following studies were reviewed today: ***  EKG:  EKG is *** ordered today.  The ekg ordered today demonstrates ***  Recent Labs: 03/04/2021: ALT 17; BUN 9; Creatinine, Ser 1.16; Hemoglobin 15.0; Platelets 354; Potassium 4.7; Sodium 139  Recent Lipid Panel    Component Value Date/Time  CHOL  11/06/2008 0610    84        ATP III CLASSIFICATION:  <200     mg/dL   Desirable  627-035  mg/dL   Borderline High  >=009    mg/dL   High          TRIG 44 11/06/2008 0610   HDL 31 (L) 11/06/2008 0610   CHOLHDL 2.7 11/06/2008 0610   VLDL 9 11/06/2008 0610   LDLCALC  11/06/2008 0610    44        Total Cholesterol/HDL:CHD Risk Coronary Heart Disease Risk Table                     Men   Women  1/2 Average Risk   3.4   3.3  Average Risk       5.0   4.4  2 X Average Risk   9.6   7.1  3 X Average Risk  23.4   11.0        Use the calculated Patient Ratio above and the CHD Risk Table to determine the patient's CHD Risk.        ATP III CLASSIFICATION (LDL):  <100     mg/dL   Optimal  381-829  mg/dL   Near or  Above                    Optimal  130-159  mg/dL   Borderline  937-169  mg/dL   High  >678     mg/dL   Very High     Risk Assessment/Calculations:   {Does this patient have ATRIAL FIBRILLATION?[phone]}       Physical Exam:    VS:  There were no vitals taken for this visit.    Wt Readings from Last 3 Encounters:  07/18/21 199 lb 9.6 oz (90.5 kg)  06/15/21 198 lb (89.8 kg)  05/04/21 198 lb (89.8 kg)     GEN: *** Well nourished, well developed in no acute distress HEENT: Normal NECK: No JVD; No carotid bruits LYMPHATICS: No lymphadenopathy CARDIAC: ***RRR, no murmurs, rubs, gallops RESPIRATORY:  Clear to auscultation without rales, wheezing or rhonchi  ABDOMEN: Soft, non-tender, non-distended MUSCULOSKELETAL:  No edema; No deformity  SKIN: Warm and dry NEUROLOGIC:  Alert and oriented x 3 PSYCHIATRIC:  Normal affect   ASSESSMENT:     PLAN:    In order of problems listed above:  Renal duplex Pharmacy visit 3 months      {Are you ordering a CV Procedure (e.g. stress test, cath, DCCV, TEE, etc)?   Press F2        :[id number]}    Medication Adjustments/Labs and Tests Ordered: Current medicines are reviewed at length with the patient today.  Concerns regarding medicines are outlined above.  No orders of the defined types were placed in this encounter.  No orders of the defined types were placed in this encounter.   There are no Patient Instructions on file for this visit.   Signed, Maisie Fus, MD  09/19/2021 9:28 AM    [facility]

## 2021-09-20 ENCOUNTER — Telehealth: Payer: Self-pay

## 2021-09-20 ENCOUNTER — Other Ambulatory Visit: Payer: Self-pay | Admitting: Family Medicine

## 2021-09-20 DIAGNOSIS — G894 Chronic pain syndrome: Secondary | ICD-10-CM

## 2021-09-20 DIAGNOSIS — D57219 Sickle-cell/Hb-C disease with crisis, unspecified: Secondary | ICD-10-CM

## 2021-09-20 MED ORDER — OXYCODONE HCL 30 MG PO TABS: 30.0000 mg | ORAL_TABLET | Freq: Four times a day (QID) | ORAL | 0 refills | Status: DC | PRN

## 2021-09-20 NOTE — Progress Notes (Signed)
Reviewed PDMP substance reporting system prior to prescribing opiate medications. No inconsistencies noted.    Meds ordered this encounter  Medications   oxycodone (ROXICODONE) 30 MG immediate release tablet    Sig: Take 1 tablet (30 mg total) by mouth every 6 (six) hours as needed for pain.    Dispense:  60 tablet    Refill:  0    Order Specific Question:   Supervising Provider    Answer:   JEGEDE, OLUGBEMIGA E [[id number]]     Adilynne Fitzwater Moore Shany Marinez  APRN, MSN, FNP-C [facility] [address]  [place], [place] [address] [phone]  

## 2021-09-20 NOTE — Telephone Encounter (Signed)
Oxycodone  °

## 2021-09-22 ENCOUNTER — Other Ambulatory Visit: Payer: Self-pay | Admitting: Family Medicine

## 2021-09-22 ENCOUNTER — Ambulatory Visit: Payer: Self-pay | Admitting: Nurse Practitioner

## 2021-09-22 ENCOUNTER — Telehealth: Payer: Self-pay | Admitting: Nurse Practitioner

## 2021-09-22 DIAGNOSIS — D57219 Sickle-cell/Hb-C disease with crisis, unspecified: Secondary | ICD-10-CM

## 2021-09-22 DIAGNOSIS — G894 Chronic pain syndrome: Secondary | ICD-10-CM

## 2021-09-22 MED ORDER — OXYCODONE HCL 30 MG PO TABS: 30.0000 mg | ORAL_TABLET | Freq: Four times a day (QID) | ORAL | 0 refills | Status: DC | PRN

## 2021-09-22 NOTE — Telephone Encounter (Signed)
[facility] [address] [facility] other pharamacy is out of stock

## 2021-09-22 NOTE — Progress Notes (Signed)
Pharmacy changed.   Meds ordered this encounter  Medications   oxycodone (ROXICODONE) 30 MG immediate release tablet    Sig: Take 1 tablet (30 mg total) by mouth every 6 (six) hours as needed for pain.    Dispense:  60 tablet    Refill:  0    Order Specific Question:   Supervising Provider    Answer:   Quentin Angst [id number]

## 2021-09-26 ENCOUNTER — Encounter: Payer: Self-pay | Admitting: Internal Medicine

## 2021-10-04 ENCOUNTER — Telehealth: Payer: Self-pay

## 2021-10-04 NOTE — Telephone Encounter (Signed)
Oxycodone  DUE tomorrow  [facility] [address] [facility] in [place]!!!!!

## 2021-10-05 ENCOUNTER — Telehealth: Payer: Self-pay

## 2021-10-06 NOTE — Telephone Encounter (Signed)
Error

## 2021-10-12 ENCOUNTER — Other Ambulatory Visit: Payer: Self-pay | Admitting: Nurse Practitioner

## 2021-10-12 ENCOUNTER — Ambulatory Visit (INDEPENDENT_AMBULATORY_CARE_PROVIDER_SITE_OTHER): Payer: Medicaid Other | Admitting: Nurse Practitioner

## 2021-10-12 ENCOUNTER — Encounter: Payer: Self-pay | Admitting: Nurse Practitioner

## 2021-10-12 VITALS — BP 154/114 | HR 85 | Temp 97.5°F | Ht 67.0 in | Wt 205.2 lb

## 2021-10-12 DIAGNOSIS — Z5181 Encounter for therapeutic drug level monitoring: Secondary | ICD-10-CM | POA: Diagnosis not present

## 2021-10-12 DIAGNOSIS — Z79891 Long term (current) use of opiate analgesic: Secondary | ICD-10-CM

## 2021-10-12 DIAGNOSIS — I10 Essential (primary) hypertension: Secondary | ICD-10-CM

## 2021-10-12 DIAGNOSIS — E559 Vitamin D deficiency, unspecified: Secondary | ICD-10-CM | POA: Diagnosis not present

## 2021-10-12 DIAGNOSIS — D571 Sickle-cell disease without crisis: Secondary | ICD-10-CM | POA: Diagnosis not present

## 2021-10-12 MED ORDER — METOPROLOL SUCCINATE ER 50 MG PO TB24: 50.0000 mg | ORAL_TABLET | Freq: Every day | ORAL | 3 refills | Status: DC

## 2021-10-12 MED ORDER — CLONIDINE HCL 0.1 MG PO TABS
0.1000 mg | ORAL_TABLET | Freq: Once | ORAL | Status: AC
  Administered 2021-10-12: 0.1 mg via ORAL

## 2021-10-12 NOTE — Patient Instructions (Signed)
1. Encounter for monitoring opioid maintenance therapy  - [id number] 11+Oxyco+Alc+Crt-Bund  2. Sickle cell disease without crisis (HCC)  - Sickle Cell Panel  3. Essential hypertension  - cloNIDine (CATAPRES) tablet 0.1 mg - metoprolol succinate (TOPROL-XL) 50 MG 24 hr tablet; Take 1 tablet (50 mg total) by mouth daily. Take with or immediately following a meal.  Dispense: 90 tablet; Refill: 3  Follow up:  Follow up in 3 months or sooner if needed

## 2021-10-12 NOTE — Progress Notes (Signed)
_0  ID: Dean Webb, male    DOB: 12/22/1975, 46 y.o.   MRN: [id number]  Chief Complaint  Patient presents with   Follow-up    Pt is here for 2 month SCD follow up visit    Referring provider: Fenton Foy, NP   HPI  Dean Webb 46 y.o. male  has a past medical history of Asthma, GERD (gastroesophageal reflux disease), Hb-S/Hb-C disease ([place]) (1979), HTN (hypertension) (2008), Hypertension, Sickle cell anemia ([place]), and Sickle cell disease ([place]).   Patient presents today for follow up. Patient here for follow-up of elevated blood pressure. He is exercising and is adherent to low salt diet.  Patient as not been able to begin checking B/P at home, requesting order for B/P machine. Cardiac symptoms none. Patient denies chest pain, claudication, fatigue, and near-syncope.  Cardiovascular risk factors: hypertension, male gender, and obesity (BMI >= 30 kg/m2). Use of agents associated with hypertension: none. History of target organ damage: none. Blood pressure was very elevated in office today. Patient states that he did take blood pressure medications today. Patient was last seen by [facility] and was referred to cardiology for this same issue. - has been referred to cardiology - missed scheduled appointment. We will try to get this rescheduled for him today. Denies f/c/s, n/v/d, hemoptysis, PND, leg swelling Denies chest pain or edema  Note: patient's blood pressure elevated in office. Gave clonidine. Blood pressure came down to 875 systolic. Patient does have another clonidine that he will be taking at home this afternoon as part of maintenance meds. Advised to keep follow up with cardiology.   No Known Allergies  Immunization History  Administered Date(s) Administered   Influenza,inj,Quad PF,6+ Mos 03/09/2016, 10/25/2017, 01/17/2021   Pneumococcal Polysaccharide-23 03/09/2016   Tdap 08/03/2017    Past Medical History:  Diagnosis Date   Asthma    GERD (gastroesophageal  reflux disease)    Hb-S/Hb-C disease ([place]) 1979   HTN (hypertension) 2008   Hypertension    Sickle cell anemia (HCC)    Sickle cell disease (HCC)     Tobacco History: Social History   Tobacco Use  Smoking Status Every Day   Packs/day: 0.50   Years: 17.00   Total pack years: 8.50   Types: Cigarettes  Smokeless Tobacco Never   Ready to quit: Not Answered Counseling given: Not Answered   Outpatient Encounter Medications as of 10/12/2021  Medication Sig   amLODipine (NORVASC) 10 MG tablet Take 1 tablet (10 mg total) by mouth daily.   cetirizine (ZYRTEC) 10 MG tablet TAKE 1 TABLET BY MOUTH EVERY DAY AS NEEDED FOR ALLERGY   cloNIDine (CATAPRES) 0.1 MG tablet Take 1 tablet (0.1 mg total) by mouth 2 (two) times daily.   DULoxetine (CYMBALTA) 30 MG capsule Take 1 capsule (30 mg total) by mouth daily.   ibuprofen (ADVIL) 800 MG tablet Take 1 tablet (800 mg total) by mouth every 8 (eight) hours as needed.   losartan-hydrochlorothiazide (HYZAAR) 100-25 MG tablet Take 1 tablet by mouth daily.   metoprolol succinate (TOPROL-XL) 50 MG 24 hr tablet Take 1 tablet (50 mg total) by mouth daily. Take with or immediately following a meal.   oxycodone (ROXICODONE) 30 MG immediate release tablet Take 1 tablet (30 mg total) by mouth every 6 (six) hours as needed for pain.   pantoprazole (PROTONIX) 40 MG tablet Take 1 tablet (40 mg total) by mouth daily.   [DISCONTINUED] metoprolol succinate (TOPROL-XL) 25 MG 24 hr tablet Take 1 tablet (25  mg total) by mouth daily.   albuterol (VENTOLIN HFA) 108 (90 Base) MCG/ACT inhaler Inhale 2 puffs into the lungs every 6 (six) hours as needed for wheezing or shortness of breath.   Blood Pressure Monitoring (OMRON 3 SERIES BP MONITOR) DEVI 1 kit by Does not apply route daily. (Patient not taking: Reported on 07/18/2021)   Blood Pressure Monitoring (OMRON 3 SERIES BP MONITOR) DEVI 1 each by Does not apply route in the morning and at bedtime. (Patient not taking: Reported  on 10/12/2021)   morphine (AVINZA) 60 MG 24 hr capsule 1 cap(s) orally once a day (Patient not taking: Reported on 10/12/2021)   Facility-Administered Encounter Medications as of 10/12/2021  Medication   [COMPLETED] cloNIDine (CATAPRES) tablet 0.1 mg   cloNIDine (CATAPRES) tablet 0.3 mg     Review of Systems  Review of Systems  Constitutional: Negative.   HENT: Negative.    Cardiovascular: Negative.   Gastrointestinal: Negative.   Allergic/Immunologic: Negative.   Neurological: Negative.   Psychiatric/Behavioral: Negative.         Physical Exam  BP (!) 172/123 (BP Location: Right Arm, Patient Position: Sitting, Cuff Size: Large)   Pulse 85   Temp (!) 97.5 F (36.4 C)   Ht _0  (1.702 m)   Wt 205 lb 3.2 oz (93.1 kg)   SpO2 100%   BMI 32.14 kg/m   Wt Readings from Last 5 Encounters:  10/12/21 205 lb 3.2 oz (93.1 kg)  07/18/21 199 lb 9.6 oz (90.5 kg)  06/15/21 198 lb (89.8 kg)  05/04/21 198 lb (89.8 kg)  04/04/21 199 lb 6.4 oz (90.4 kg)     Physical Exam Vitals and nursing note reviewed.  Constitutional:      General: He is not in acute distress.    Appearance: He is well-developed.  Cardiovascular:     Rate and Rhythm: Normal rate and regular rhythm.  Pulmonary:     Effort: Pulmonary effort is normal.     Breath sounds: Normal breath sounds.  Skin:    General: Skin is warm and dry.  Neurological:     Mental Status: He is alert and oriented to person, place, and time.      Lab Results:  CBC    Component Value Date/Time   WBC 10.5 03/04/2021 1513   WBC 8.2 12/01/2019 0548   RBC 4.89 03/04/2021 1513   RBC 4.30 12/01/2019 0548   HGB 15.0 03/04/2021 1513   HCT 42.6 03/04/2021 1513   PLT 354 03/04/2021 1513   MCV 87 03/04/2021 1513   MCH 30.7 03/04/2021 1513   MCH 27.4 12/01/2019 0548   MCHC 35.2 03/04/2021 1513   MCHC 36.8 (H) 12/01/2019 0548   RDW 16.1 (H) 03/04/2021 1513   LYMPHSABS 4.5 (H) 03/04/2021 1513   MONOABS 1.3 (H) 12/01/2019 0548    EOSABS 0.6 (H) 03/04/2021 1513   BASOSABS 0.1 03/04/2021 1513    BMET    Component Value Date/Time   NA 139 03/04/2021 1513   K 4.7 03/04/2021 1513   CL 103 03/04/2021 1513   CO2 23 03/04/2021 1513   GLUCOSE 88 03/04/2021 1513   GLUCOSE 98 12/01/2019 0548   BUN 9 03/04/2021 1513   CREATININE 1.16 03/04/2021 1513   CALCIUM 11.6 (H) 03/04/2021 1513   GFRNONAA 77 03/22/2020 1226   GFRNONAA >60 12/01/2019 0548   GFRAA 89 03/22/2020 1226     Assessment & Plan:   Encounter for monitoring opioid maintenance therapy - [id number] 11+Oxyco+Alc+Crt-Bund  2.  Sickle cell disease without crisis ([place])  - Sickle Cell Panel  3. Essential hypertension  - cloNIDine (CATAPRES) tablet 0.1 mg - metoprolol succinate (TOPROL-XL) 50 MG 24 hr tablet; Take 1 tablet (50 mg total) by mouth daily. Take with or immediately following a meal.  Dispense: 90 tablet; Refill: 3  Follow up:  Follow up in 3 months or sooner if needed     Fenton Foy, NP 10/12/2021

## 2021-10-12 NOTE — Assessment & Plan Note (Signed)
-   [id number] 11+Oxyco+Alc+Crt-Bund  2. Sickle cell disease without crisis (HCC)  - Sickle Cell Panel  3. Essential hypertension  - cloNIDine (CATAPRES) tablet 0.1 mg - metoprolol succinate (TOPROL-XL) 50 MG 24 hr tablet; Take 1 tablet (50 mg total) by mouth daily. Take with or immediately following a meal.  Dispense: 90 tablet; Refill: 3  Follow up:  Follow up in 3 months or sooner if needed

## 2021-10-13 LAB — CMP14+CBC/D/PLT+FER+RETIC+V...
ALT: 19 IU/L (ref 0–44)
AST: 26 IU/L (ref 0–40)
Albumin/Globulin Ratio: 1.8 (ref 1.2–2.2)
Albumin: 4.5 g/dL (ref 4.1–5.1)
Alkaline Phosphatase: 110 IU/L (ref 44–121)
BUN/Creatinine Ratio: 8 — ABNORMAL LOW (ref 9–20)
BUN: 9 mg/dL (ref 6–24)
Basophils Absolute: 0.1 10*3/uL (ref 0.0–0.2)
Basos: 1 %
Bilirubin Total: 1.6 mg/dL — ABNORMAL HIGH (ref 0.0–1.2)
CO2: 21 mmol/L (ref 20–29)
Calcium: 11 mg/dL — ABNORMAL HIGH (ref 8.7–10.2)
Chloride: 102 mmol/L (ref 96–106)
Creatinine, Ser: 1.19 mg/dL (ref 0.76–1.27)
EOS (ABSOLUTE): 0.4 10*3/uL (ref 0.0–0.4)
Eos: 4 %
Ferritin: 193 ng/mL (ref 30–400)
Globulin, Total: 2.5 g/dL (ref 1.5–4.5)
Glucose: 96 mg/dL (ref 70–99)
Hematocrit: 40.7 % (ref 37.5–51.0)
Hemoglobin: 13.9 g/dL (ref 13.0–17.7)
Immature Grans (Abs): 0.1 10*3/uL (ref 0.0–0.1)
Immature Granulocytes: 1 %
Lymphocytes Absolute: 3.8 10*3/uL — ABNORMAL HIGH (ref 0.7–3.1)
Lymphs: 41 %
MCH: 29.9 pg (ref 26.6–33.0)
MCHC: 34.2 g/dL (ref 31.5–35.7)
MCV: 88 fL (ref 79–97)
Monocytes Absolute: 1.2 10*3/uL — ABNORMAL HIGH (ref 0.1–0.9)
Monocytes: 13 %
NRBC: 3 % — ABNORMAL HIGH (ref 0–0)
Neutrophils Absolute: 3.6 10*3/uL (ref 1.4–7.0)
Neutrophils: 40 %
Platelets: 313 10*3/uL (ref 150–450)
Potassium: 4.7 mmol/L (ref 3.5–5.2)
RBC: 4.65 x10E6/uL (ref 4.14–5.80)
RDW: 16.1 % — ABNORMAL HIGH (ref 11.6–15.4)
Retic Ct Pct: 3.8 % — ABNORMAL HIGH (ref 0.6–2.6)
Sodium: 136 mmol/L (ref 134–144)
Total Protein: 7 g/dL (ref 6.0–8.5)
Vit D, 25-Hydroxy: 20.6 ng/mL — ABNORMAL LOW (ref 30.0–100.0)
WBC: 9.1 10*3/uL (ref 3.4–10.8)
eGFR: 77 mL/min/{1.73_m2} (ref >59)

## 2021-10-14 ENCOUNTER — Telehealth: Payer: Self-pay

## 2021-10-14 ENCOUNTER — Other Ambulatory Visit: Payer: Self-pay | Admitting: Nurse Practitioner

## 2021-10-14 DIAGNOSIS — G894 Chronic pain syndrome: Secondary | ICD-10-CM

## 2021-10-14 DIAGNOSIS — D57219 Sickle-cell/Hb-C disease with crisis, unspecified: Secondary | ICD-10-CM

## 2021-10-14 LAB — DRUG SCREEN 764883 11+OXYCO+ALC+CRT-BUND
Amphetamines, Urine: NEGATIVE ng/mL
BENZODIAZ UR QL: NEGATIVE ng/mL
Barbiturate: NEGATIVE ng/mL
Cannabinoid Quant, Ur: NEGATIVE ng/mL
Cocaine (Metabolite): NEGATIVE ng/mL
Creatinine: 71.5 mg/dL (ref 20.0–300.0)
Ethanol: NEGATIVE %
Meperidine: NEGATIVE ng/mL
Methadone Screen, Urine: NEGATIVE ng/mL
OPIATE SCREEN URINE: NEGATIVE ng/mL
Oxycodone/Oxymorphone, Urine: NEGATIVE ng/mL
Phencyclidine: NEGATIVE ng/mL
Propoxyphene: NEGATIVE ng/mL
Tramadol: NEGATIVE ng/mL
pH, Urine: 7.2 (ref 4.5–8.9)

## 2021-10-14 MED ORDER — OXYCODONE HCL 30 MG PO TABS: 30.0000 mg | ORAL_TABLET | Freq: Four times a day (QID) | ORAL | 0 refills | Status: DC | PRN

## 2021-10-14 NOTE — Progress Notes (Signed)
PDMP reviewed for oxycodone refill.

## 2021-10-14 NOTE — Telephone Encounter (Signed)
Refilled - patient will need to be seen in office before next refill. Thanks.

## 2021-10-14 NOTE — Telephone Encounter (Signed)
Oxycodone  °

## 2021-10-27 ENCOUNTER — Other Ambulatory Visit: Payer: Self-pay | Admitting: Nurse Practitioner

## 2021-10-27 ENCOUNTER — Telehealth: Payer: Self-pay

## 2021-10-27 NOTE — Telephone Encounter (Signed)
This prescription was picked up on 10/21/21. Not time for refill. Patient needs appointment before getting this refilled. Thanks.

## 2021-10-27 NOTE — Telephone Encounter (Signed)
Oxycodone  °

## 2021-10-27 NOTE — Telephone Encounter (Signed)
Will need appointment sooner before next med refill -can be nurse visit

## 2021-11-02 ENCOUNTER — Other Ambulatory Visit: Payer: Medicaid Other

## 2021-11-02 ENCOUNTER — Telehealth: Payer: Self-pay

## 2021-11-02 ENCOUNTER — Other Ambulatory Visit: Payer: Self-pay | Admitting: Nurse Practitioner

## 2021-11-02 DIAGNOSIS — D571 Sickle-cell disease without crisis: Secondary | ICD-10-CM

## 2021-11-02 NOTE — Telephone Encounter (Signed)
Pt is schedule for labs today by 3pm

## 2021-11-02 NOTE — Progress Notes (Signed)
Last oxy - 3 dys ago - will update controlled substance agreement

## 2021-11-03 ENCOUNTER — Other Ambulatory Visit: Payer: Self-pay | Admitting: Nurse Practitioner

## 2021-11-03 DIAGNOSIS — G894 Chronic pain syndrome: Secondary | ICD-10-CM

## 2021-11-03 DIAGNOSIS — D57219 Sickle-cell/Hb-C disease with crisis, unspecified: Secondary | ICD-10-CM

## 2021-11-03 MED ORDER — OXYCODONE HCL 30 MG PO TABS: 30.0000 mg | ORAL_TABLET | Freq: Four times a day (QID) | ORAL | 0 refills | Status: DC | PRN

## 2021-11-07 ENCOUNTER — Other Ambulatory Visit: Payer: Self-pay | Admitting: Nurse Practitioner

## 2021-11-07 LAB — DRUG SCREEN 16 WITH REFLEX CONFIRMATION
Amphetamines, IA: NEGATIVE ng/mL
Barbiturates, IA: NEGATIVE ug/mL
Benzodiazepines, IA: NEGATIVE ng/mL
Buprenorphine, IA: NEGATIVE ng/mL
Carisoprodol, IA: NEGATIVE ug/mL
Cocaine/Metabolite, IA: NEGATIVE ng/mL
Fentanyl, IA: NEGATIVE ng/mL
Gabapentin, IA: NEGATIVE ug/mL
Meperidine, IA: NEGATIVE ng/mL
Methadone, IA: NEGATIVE ng/mL
Opiates, IA: NEGATIVE ng/mL
Oxycodones, IA: NEGATIVE ng/mL
Phencyclidine, IA: NEGATIVE ng/mL
Propoxyphene, IA: NEGATIVE ng/mL
THC (Marijuana) Mtb, IA: NEGATIVE ng/mL
Tramadol, IA: NEGATIVE ng/mL

## 2021-11-07 LAB — SPECIMEN STATUS REPORT

## 2021-11-11 LAB — CMP14+CBC/D/PLT+FER+RETIC+V...
ALT: 20 IU/L (ref 0–44)
AST: 29 IU/L (ref 0–40)
Albumin/Globulin Ratio: 1.7 (ref 1.2–2.2)
Albumin: 4.7 g/dL (ref 4.1–5.1)
Alkaline Phosphatase: 105 IU/L (ref 44–121)
BUN/Creatinine Ratio: 6 — ABNORMAL LOW (ref 9–20)
BUN: 7 mg/dL (ref 6–24)
Basophils Absolute: 0.1 10*3/uL (ref 0.0–0.2)
Basos: 1 %
Bilirubin Total: 1 mg/dL (ref 0.0–1.2)
CO2: 19 mmol/L — ABNORMAL LOW (ref 20–29)
Calcium: 11.1 mg/dL — ABNORMAL HIGH (ref 8.7–10.2)
Chloride: 101 mmol/L (ref 96–106)
Creatinine, Ser: 1.11 mg/dL (ref 0.76–1.27)
EOS (ABSOLUTE): 0.4 10*3/uL (ref 0.0–0.4)
Eos: 3 %
Ferritin: 215 ng/mL (ref 30–400)
Globulin, Total: 2.8 g/dL (ref 1.5–4.5)
Glucose: 99 mg/dL (ref 70–99)
Hematocrit: 39.7 % (ref 37.5–51.0)
Hemoglobin: 13.6 g/dL (ref 13.0–17.7)
Immature Grans (Abs): 0.1 10*3/uL (ref 0.0–0.1)
Immature Granulocytes: 1 %
Lymphocytes Absolute: 3.7 10*3/uL — ABNORMAL HIGH (ref 0.7–3.1)
Lymphs: 30 %
MCH: 30.1 pg (ref 26.6–33.0)
MCHC: 34.3 g/dL (ref 31.5–35.7)
MCV: 88 fL (ref 79–97)
Monocytes Absolute: 1.6 10*3/uL — ABNORMAL HIGH (ref 0.1–0.9)
Monocytes: 13 %
NRBC: 2 % — ABNORMAL HIGH (ref 0–0)
Neutrophils Absolute: 6.5 10*3/uL (ref 1.4–7.0)
Neutrophils: 52 %
Platelets: 336 10*3/uL (ref 150–450)
Potassium: 4.5 mmol/L (ref 3.5–5.2)
RBC: 4.52 x10E6/uL (ref 4.14–5.80)
RDW: 16.8 % — ABNORMAL HIGH (ref 11.6–15.4)
Retic Ct Pct: 3.4 % — ABNORMAL HIGH (ref 0.6–2.6)
Sodium: 137 mmol/L (ref 134–144)
Total Protein: 7.5 g/dL (ref 6.0–8.5)
Vit D, 25-Hydroxy: 32 ng/mL (ref 30.0–100.0)
WBC: 12.3 10*3/uL — ABNORMAL HIGH (ref 3.4–10.8)
eGFR: 83 mL/min/{1.73_m2} (ref >59)

## 2021-11-11 LAB — RPR+HIV+GC+CT PANEL
Chlamydia trachomatis, NAA: NEGATIVE
HIV Screen 4th Generation wRfx: NONREACTIVE
Neisseria Gonorrhoeae by PCR: NEGATIVE

## 2021-11-21 ENCOUNTER — Telehealth: Payer: Self-pay

## 2021-11-21 NOTE — Telephone Encounter (Signed)
[facility]one   [facility] [address]  

## 2021-11-23 ENCOUNTER — Other Ambulatory Visit: Payer: Self-pay | Admitting: Nurse Practitioner

## 2021-11-23 ENCOUNTER — Telehealth: Payer: Self-pay | Admitting: Nurse Practitioner

## 2021-11-23 DIAGNOSIS — G894 Chronic pain syndrome: Secondary | ICD-10-CM

## 2021-11-23 DIAGNOSIS — D57219 Sickle-cell/Hb-C disease with crisis, unspecified: Secondary | ICD-10-CM

## 2021-11-23 NOTE — Telephone Encounter (Signed)
Patient did not get last prescription filled until 02/16/21. He is not due for a refill at this time.

## 2021-11-23 NOTE — Telephone Encounter (Signed)
[facility] [address]

## 2021-11-25 ENCOUNTER — Other Ambulatory Visit: Payer: Self-pay

## 2021-11-25 ENCOUNTER — Other Ambulatory Visit: Payer: Self-pay | Admitting: Nurse Practitioner

## 2021-11-28 NOTE — Telephone Encounter (Signed)
Not due for refill yet. Thanks

## 2021-11-28 NOTE — Telephone Encounter (Signed)
Called pt back and he was not at home number. Family advised they will have him to call back  [facility]

## 2021-11-28 NOTE — Telephone Encounter (Signed)
I will send it in on the 19th. Thanks.

## 2021-11-28 NOTE — Telephone Encounter (Signed)
Pt called and said [facility] avail [facility] [address]

## 2021-11-30 ENCOUNTER — Other Ambulatory Visit: Payer: Self-pay | Admitting: Nurse Practitioner

## 2021-11-30 DIAGNOSIS — G894 Chronic pain syndrome: Secondary | ICD-10-CM

## 2021-11-30 DIAGNOSIS — D57219 Sickle-cell/Hb-C disease with crisis, unspecified: Secondary | ICD-10-CM

## 2021-11-30 MED ORDER — OXYCODONE HCL 30 MG PO TABS: 30.0000 mg | ORAL_TABLET | Freq: Four times a day (QID) | ORAL | 0 refills | Status: DC | PRN

## 2021-12-13 ENCOUNTER — Other Ambulatory Visit: Payer: Self-pay | Admitting: Family Medicine

## 2021-12-13 ENCOUNTER — Telehealth: Payer: Self-pay

## 2021-12-13 NOTE — Progress Notes (Signed)
Patiient's medication is not due until 12/15/2021. PCP will address upon return.   Donia Pounds  APRN, MSN, FNP-C [facility] [address] [place], [place] [address] [phone]

## 2021-12-13 NOTE — Telephone Encounter (Signed)
Oxycodone  °

## 2021-12-14 ENCOUNTER — Other Ambulatory Visit: Payer: Self-pay | Admitting: Nurse Practitioner

## 2021-12-14 DIAGNOSIS — J4520 Mild intermittent asthma, uncomplicated: Secondary | ICD-10-CM

## 2021-12-14 DIAGNOSIS — G894 Chronic pain syndrome: Secondary | ICD-10-CM

## 2021-12-14 DIAGNOSIS — D57219 Sickle-cell/Hb-C disease with crisis, unspecified: Secondary | ICD-10-CM

## 2021-12-14 MED ORDER — ALBUTEROL SULFATE HFA 108 (90 BASE) MCG/ACT IN AERS: 2.0000 | INHALATION_SPRAY | Freq: Four times a day (QID) | RESPIRATORY_TRACT | 1 refills | Status: DC | PRN

## 2021-12-14 MED ORDER — OXYCODONE HCL 30 MG PO TABS: 30.0000 mg | ORAL_TABLET | Freq: Four times a day (QID) | ORAL | 0 refills | Status: DC | PRN

## 2021-12-27 ENCOUNTER — Telehealth: Payer: Self-pay | Admitting: Nurse Practitioner

## 2021-12-27 NOTE — Telephone Encounter (Signed)
Oxycodone  °

## 2021-12-28 ENCOUNTER — Other Ambulatory Visit: Payer: Self-pay | Admitting: Family Medicine

## 2021-12-29 ENCOUNTER — Telehealth: Payer: Self-pay | Admitting: Nurse Practitioner

## 2021-12-29 ENCOUNTER — Other Ambulatory Visit: Payer: Self-pay

## 2021-12-30 ENCOUNTER — Other Ambulatory Visit: Payer: Self-pay | Admitting: Nurse Practitioner

## 2021-12-30 DIAGNOSIS — G894 Chronic pain syndrome: Secondary | ICD-10-CM

## 2021-12-30 DIAGNOSIS — D57219 Sickle-cell/Hb-C disease with crisis, unspecified: Secondary | ICD-10-CM

## 2021-12-30 MED ORDER — OXYCODONE HCL 30 MG PO TABS: 30.0000 mg | ORAL_TABLET | Freq: Four times a day (QID) | ORAL | 0 refills | Status: DC | PRN

## 2021-12-30 NOTE — Progress Notes (Signed)
PDMP reviewed. Refill on pain medication sent to pharmacy.

## 2021-12-30 NOTE — Telephone Encounter (Signed)
error 

## 2022-01-12 ENCOUNTER — Ambulatory Visit (INDEPENDENT_AMBULATORY_CARE_PROVIDER_SITE_OTHER): Payer: Medicaid Other | Admitting: Nurse Practitioner

## 2022-01-12 ENCOUNTER — Encounter: Payer: Self-pay | Admitting: Nurse Practitioner

## 2022-01-12 VITALS — BP 149/102 | HR 80 | Temp 98.1°F | Ht 67.0 in | Wt 202.2 lb

## 2022-01-12 DIAGNOSIS — Z23 Encounter for immunization: Secondary | ICD-10-CM | POA: Diagnosis not present

## 2022-01-12 DIAGNOSIS — T7840XA Allergy, unspecified, initial encounter: Secondary | ICD-10-CM

## 2022-01-12 DIAGNOSIS — D5720 Sickle-cell/Hb-C disease without crisis: Secondary | ICD-10-CM

## 2022-01-12 DIAGNOSIS — Z1211 Encounter for screening for malignant neoplasm of colon: Secondary | ICD-10-CM

## 2022-01-12 DIAGNOSIS — E559 Vitamin D deficiency, unspecified: Secondary | ICD-10-CM

## 2022-01-12 DIAGNOSIS — G894 Chronic pain syndrome: Secondary | ICD-10-CM | POA: Diagnosis not present

## 2022-01-12 DIAGNOSIS — J4520 Mild intermittent asthma, uncomplicated: Secondary | ICD-10-CM

## 2022-01-12 DIAGNOSIS — D57219 Sickle-cell/Hb-C disease with crisis, unspecified: Secondary | ICD-10-CM

## 2022-01-12 DIAGNOSIS — D571 Sickle-cell disease without crisis: Secondary | ICD-10-CM

## 2022-01-12 DIAGNOSIS — R52 Pain, unspecified: Secondary | ICD-10-CM

## 2022-01-12 MED ORDER — MONTELUKAST SODIUM 10 MG PO TABS: 10.0000 mg | ORAL_TABLET | Freq: Every day | ORAL | 3 refills | Status: DC

## 2022-01-12 MED ORDER — OXYCODONE HCL 30 MG PO TABS: 30.0000 mg | ORAL_TABLET | Freq: Four times a day (QID) | ORAL | 0 refills | Status: DC | PRN

## 2022-01-12 MED ORDER — IBUPROFEN 800 MG PO TABS: 800.0000 mg | ORAL_TABLET | Freq: Three times a day (TID) | ORAL | 0 refills | Status: DC | PRN

## 2022-01-12 MED ORDER — ALBUTEROL SULFATE HFA 108 (90 BASE) MCG/ACT IN AERS: 2.0000 | INHALATION_SPRAY | Freq: Four times a day (QID) | RESPIRATORY_TRACT | 1 refills | Status: DC | PRN

## 2022-01-12 NOTE — Progress Notes (Signed)
_0  ID: Dean Webb, male    DOB: Oct 10, 1975, 46 y.o.   MRN: [id number]  Chief Complaint  Patient presents with   Follow-up    medication    Referring provider: Fenton Foy, NP   HPI  Patient presents today for sickle cell follow-up.  Overall he has been doing well.  He does need blood work today.  He is due for colon cancer screening.  Flu shot in office today. 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   Blood Pressure Monitoring (OMRON 3 SERIES BP MONITOR) DEVI 1 kit by Does not apply route daily.   cetirizine (ZYRTEC) 10 MG tablet TAKE 1 TABLET BY MOUTH EVERY DAY AS NEEDED FOR ALLERGY   cloNIDine (CATAPRES) 0.1 MG tablet Take 1 tablet (0.1 mg total) by mouth 2 (two) times daily.   DULoxetine (CYMBALTA) 30 MG capsule Take 1 capsule (30 mg total) by mouth daily.   losartan-hydrochlorothiazide (HYZAAR) 100-25 MG tablet Take 1 tablet by mouth daily.   metoprolol succinate (TOPROL-XL) 50 MG 24 hr tablet Take 1 tablet (50 mg total) by mouth daily. Take with or immediately following a meal.    montelukast (SINGULAIR) 10 MG tablet Take 1 tablet (10 mg total) by mouth at bedtime.   pantoprazole (PROTONIX) 40 MG tablet Take 1 tablet (40 mg total) by mouth daily.   [DISCONTINUED] albuterol (VENTOLIN HFA) 108 (90 Base) MCG/ACT inhaler Inhale 2 puffs into the lungs every 6 (six) hours as needed for wheezing or shortness of breath.   [DISCONTINUED] ibuprofen (ADVIL) 800 MG tablet Take 1 tablet (800 mg total) by mouth every 8 (eight) hours as needed.   [DISCONTINUED] oxycodone (ROXICODONE) 30 MG immediate release tablet Take 1 tablet (30 mg total) by mouth every 6 (six) hours as needed for pain.   albuterol (VENTOLIN HFA) 108 (90 Base) MCG/ACT inhaler Inhale 2 puffs into the lungs every 6 (six) hours as needed for wheezing or shortness of breath.   Blood Pressure Monitoring (OMRON 3 SERIES BP MONITOR) DEVI 1 each by Does not apply route in the morning and at bedtime. (Patient not taking: Reported on 10/12/2021)   ibuprofen (ADVIL) 800 MG tablet Take 1 tablet (800 mg total) by mouth every 8 (eight) hours as needed.   morphine (AVINZA) 60 MG 24 hr capsule 1 cap(s) orally once a day (Patient not taking: Reported on 10/12/2021)   oxycodone (ROXICODONE) 30 MG immediate release tablet Take 1 tablet (30 mg total) by mouth every 6 (six) hours as needed for pain.   Facility-Administered  Encounter Medications as of 01/12/2022  Medication   cloNIDine (CATAPRES) tablet 0.3 mg     Review of Systems  Review of Systems  Constitutional: Negative.   HENT: Negative.    Cardiovascular: Negative.   Gastrointestinal: Negative.   Allergic/Immunologic: Negative.   Neurological: Negative.   Psychiatric/Behavioral: Negative.         Physical Exam  BP (!) 149/102   Pulse 80   Temp 98.1 F (36.7 C)   Ht _0  (1.702 m)   Wt 202 lb 3.2 oz (91.7 kg)   SpO2 99%   BMI 31.67 kg/m   Wt Readings from Last 5 Encounters:  01/12/22 202 lb 3.2 oz (91.7 kg)  10/12/21 205 lb 3.2 oz (93.1 kg)  07/18/21 199  lb 9.6 oz (90.5 kg)  06/15/21 198 lb (89.8 kg)  05/04/21 198 lb (89.8 kg)     Physical Exam Vitals and nursing note reviewed.  Constitutional:      General: He is not in acute distress.    Appearance: He is well-developed.  Cardiovascular:     Rate and Rhythm: Normal rate and regular rhythm.  Pulmonary:     Effort: Pulmonary effort is normal.     Breath sounds: Normal breath sounds.  Skin:    General: Skin is warm and dry.  Neurological:     Mental Status: He is alert and oriented to person, place, and time.      Lab Results:  CBC    Component Value Date/Time   WBC 9.1 01/12/2022 1625   WBC 8.2 12/01/2019 0548   RBC 4.91 01/12/2022 1625   RBC 4.30 12/01/2019 0548   HGB 14.5 01/12/2022 1625   HCT 41.7 01/12/2022 1625   PLT 338 01/12/2022 1625   MCV 85 01/12/2022 1625   MCH 29.5 01/12/2022 1625   MCH 27.4 12/01/2019 0548   MCHC 34.8 01/12/2022 1625   MCHC 36.8 (H) 12/01/2019 0548   RDW 15.8 (H) 01/12/2022 1625   LYMPHSABS 4.3 (H) 01/12/2022 1625   MONOABS 1.3 (H) 12/01/2019 0548   EOSABS 0.5 (H) 01/12/2022 1625   BASOSABS 0.1 01/12/2022 1625    BMET    Component Value Date/Time   NA 139 01/12/2022 1625   K 4.5 01/12/2022 1625   CL 103 01/12/2022 1625   CO2 23 01/12/2022 1625   GLUCOSE 97 01/12/2022 1625   GLUCOSE 98 12/01/2019 0548   BUN 12 01/12/2022 1625   CREATININE 1.28 (H) 01/12/2022 1625   CALCIUM 11.1 (H) 01/12/2022 1625   GFRNONAA 77 03/22/2020 1226   GFRNONAA >60 12/01/2019 0548   GFRAA 89 03/22/2020 1226      Assessment & Plan:   Chronic pain syndrome - 546503 11+Oxyco+Alc+Crt-Bund - ibuprofen (ADVIL) 800 MG tablet; Take 1 tablet (800 mg total) by mouth every 8 (eight) hours as needed.  Dispense: 30 tablet; Refill: 0 - oxycodone (ROXICODONE) 30 MG immediate release tablet; Take 1 tablet (30 mg total) by mouth every 6 (six) hours as needed for pain.  Dispense: 60 tablet; Refill: 0  2. 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 Colon cancer screening  - Cologuard  4. Flu vaccine need  - Flu Vaccine QUAD 8moIM (Fluarix, Fluzone & Alfiuria Quad PF)  5. Mild intermittent asthma without complication  - albuterol (VENTOLIN HFA) 108 (90 Base) MCG/ACT inhaler; Inhale 2 puffs into the lungs every 6 (six) hours as needed for wheezing or shortness of breath.  Dispense: 8 g;  Refill: 1  6. Sickle cell disease without crisis ([place])  - ibuprofen (ADVIL) 800 MG tablet; Take 1 tablet (800 mg total) by mouth every 8 (eight) hours as needed.  Dispense: 30 tablet; Refill: 0  7. Body aches  - ibuprofen (ADVIL) 800 MG tablet; Take 1 tablet (800 mg total) by mouth every 8 (eight) hours as needed.  Dispense: 30 tablet; Refill: 0  8. Sickle-cell/Hb-C disease with crisis, unspecified ([place])  - oxycodone (ROXICODONE) 30 MG immediate release tablet; Take 1 tablet (30 mg total) by mouth every 6 (six) hours as needed for pain.  Dispense: 60 tablet; Refill: 0  9. Allergy, initial encounter  - montelukast (SINGULAIR) 10 MG tablet; Take 1 tablet (10 mg total) by mouth at bedtime.  Dispense: 30 tablet; Refill: 3   Follow up:  Follow up in 3 months     Fenton Foy, NP 01/13/2022

## 2022-01-12 NOTE — Patient Instructions (Signed)
1. Chronic pain syndrome  - 268341 11+Oxyco+Alc+Crt-Bund - ibuprofen (ADVIL) 800 MG tablet; Take 1 tablet (800 mg total) by mouth every 8 (eight) hours as needed.  Dispense: 30 tablet; Refill: 0 - oxycodone (ROXICODONE) 30 MG immediate release tablet; Take 1 tablet (30 mg total) by mouth every 6 (six) hours as needed for pain.  Dispense: 60 tablet; Refill: 0  2. Sickle cell-hemoglobin C disease without crisis (HCC)  - Sickle Cell Panel  3. Colon cancer screening  - Cologuard  4. Flu vaccine need  - Flu Vaccine QUAD 29mo+IM (Fluarix, Fluzone & Alfiuria Quad PF)  5. Mild intermittent asthma without complication  - albuterol (VENTOLIN HFA) 108 (90 Base) MCG/ACT inhaler; Inhale 2 puffs into the lungs every 6 (six) hours as needed for wheezing or shortness of breath.  Dispense: 8 g; Refill: 1  6. Sickle cell disease without crisis (HCC)  - ibuprofen (ADVIL) 800 MG tablet; Take 1 tablet (800 mg total) by mouth every 8 (eight) hours as needed.  Dispense: 30 tablet; Refill: 0  7. Body aches  - ibuprofen (ADVIL) 800 MG tablet; Take 1 tablet (800 mg total) by mouth every 8 (eight) hours as needed.  Dispense: 30 tablet; Refill: 0  8. Sickle-cell/Hb-C disease with crisis, unspecified (HCC)  - oxycodone (ROXICODONE) 30 MG immediate release tablet; Take 1 tablet (30 mg total) by mouth every 6 (six) hours as needed for pain.  Dispense: 60 tablet; Refill: 0  9. Allergy, initial encounter  - montelukast (SINGULAIR) 10 MG tablet; Take 1 tablet (10 mg total) by mouth at bedtime.  Dispense: 30 tablet; Refill: 3   Follow up:  Follow up in 3 months

## 2022-01-13 ENCOUNTER — Encounter: Payer: Self-pay | Admitting: Nurse Practitioner

## 2022-01-13 ENCOUNTER — Other Ambulatory Visit: Payer: Self-pay

## 2022-01-13 ENCOUNTER — Other Ambulatory Visit: Payer: Self-pay | Admitting: Nurse Practitioner

## 2022-01-13 ENCOUNTER — Telehealth: Payer: Self-pay

## 2022-01-13 LAB — CMP14+CBC/D/PLT+FER+RETIC+V...
ALT: 26 IU/L (ref 0–44)
AST: 26 IU/L (ref 0–40)
Albumin/Globulin Ratio: 1.8 (ref 1.2–2.2)
Albumin: 4.9 g/dL (ref 4.1–5.1)
Alkaline Phosphatase: 98 IU/L (ref 44–121)
BUN/Creatinine Ratio: 9 (ref 9–20)
BUN: 12 mg/dL (ref 6–24)
Basophils Absolute: 0.1 10*3/uL (ref 0.0–0.2)
Basos: 1 %
Bilirubin Total: 1.2 mg/dL (ref 0.0–1.2)
CO2: 23 mmol/L (ref 20–29)
Calcium: 11.1 mg/dL — ABNORMAL HIGH (ref 8.7–10.2)
Chloride: 103 mmol/L (ref 96–106)
Creatinine, Ser: 1.28 mg/dL — ABNORMAL HIGH (ref 0.76–1.27)
EOS (ABSOLUTE): 0.5 10*3/uL — ABNORMAL HIGH (ref 0.0–0.4)
Eos: 5 %
Ferritin: 161 ng/mL (ref 30–400)
Globulin, Total: 2.7 g/dL (ref 1.5–4.5)
Glucose: 97 mg/dL (ref 70–99)
Hematocrit: 41.7 % (ref 37.5–51.0)
Hemoglobin: 14.5 g/dL (ref 13.0–17.7)
Immature Grans (Abs): 0.1 10*3/uL (ref 0.0–0.1)
Immature Granulocytes: 1 %
Lymphocytes Absolute: 4.3 10*3/uL — ABNORMAL HIGH (ref 0.7–3.1)
Lymphs: 47 %
MCH: 29.5 pg (ref 26.6–33.0)
MCHC: 34.8 g/dL (ref 31.5–35.7)
MCV: 85 fL (ref 79–97)
Monocytes Absolute: 1.2 10*3/uL — ABNORMAL HIGH (ref 0.1–0.9)
Monocytes: 14 %
NRBC: 2 % — ABNORMAL HIGH (ref 0–0)
Neutrophils Absolute: 2.9 10*3/uL (ref 1.4–7.0)
Neutrophils: 32 %
Platelets: 338 10*3/uL (ref 150–450)
Potassium: 4.5 mmol/L (ref 3.5–5.2)
RBC: 4.91 x10E6/uL (ref 4.14–5.80)
RDW: 15.8 % — ABNORMAL HIGH (ref 11.6–15.4)
Retic Ct Pct: 3.7 % — ABNORMAL HIGH (ref 0.6–2.6)
Sodium: 139 mmol/L (ref 134–144)
Total Protein: 7.6 g/dL (ref 6.0–8.5)
Vit D, 25-Hydroxy: 16.1 ng/mL — ABNORMAL LOW (ref 30.0–100.0)
WBC: 9.1 10*3/uL (ref 3.4–10.8)
eGFR: 70 mL/min/{1.73_m2} (ref >59)

## 2022-01-13 MED ORDER — VITAMIN D (ERGOCALCIFEROL) 1.25 MG (50000 UNIT) PO CAPS: 50000.0000 [IU] | ORAL_CAPSULE | ORAL | 2 refills | Status: DC

## 2022-01-13 NOTE — Assessment & Plan Note (Signed)
-   [id number] 11+Oxyco+Alc+Crt-Bund - ibuprofen (ADVIL) 800 MG tablet; Take 1 tablet (800 mg total) by mouth every 8 (eight) hours as needed.  Dispense: 30 tablet; Refill: 0 - oxycodone (ROXICODONE) 30 MG immediate release tablet; Take 1 tablet (30 mg total) by mouth every 6 (six) hours as needed for pain.  Dispense: 60 tablet; Refill: 0  2. Sickle cell-hemoglobin C disease without crisis (HCC)  - Sickle Cell Panel  3. Colon cancer screening  - Cologuard  4. Flu vaccine need  - Flu Vaccine QUAD 70mo+IM (Fluarix, Fluzone & Alfiuria Quad PF)  5. Mild intermittent asthma without complication  - albuterol (VENTOLIN HFA) 108 (90 Base) MCG/ACT inhaler; Inhale 2 puffs into the lungs every 6 (six) hours as needed for wheezing or shortness of breath.  Dispense: 8 g; Refill: 1  6. Sickle cell disease without crisis (HCC)  - ibuprofen (ADVIL) 800 MG tablet; Take 1 tablet (800 mg total) by mouth every 8 (eight) hours as needed.  Dispense: 30 tablet; Refill: 0  7. Body aches  - ibuprofen (ADVIL) 800 MG tablet; Take 1 tablet (800 mg total) by mouth every 8 (eight) hours as needed.  Dispense: 30 tablet; Refill: 0  8. Sickle-cell/Hb-C disease with crisis, unspecified (HCC)  - oxycodone (ROXICODONE) 30 MG immediate release tablet; Take 1 tablet (30 mg total) by mouth every 6 (six) hours as needed for pain.  Dispense: 60 tablet; Refill: 0  9. Allergy, initial encounter  - montelukast (SINGULAIR) 10 MG tablet; Take 1 tablet (10 mg total) by mouth at bedtime.  Dispense: 30 tablet; Refill: 3   Follow up:  Follow up in 3 months

## 2022-01-13 NOTE — Telephone Encounter (Signed)
Prior authorization has been submitted for Oxycodone today via CoverMyMeds Key: [id number]

## 2022-01-16 NOTE — Telephone Encounter (Signed)
Approved until 07/15/22

## 2022-01-18 LAB — DRUG SCREEN 764883 11+OXYCO+ALC+CRT-BUND
Amphetamines, Urine: NEGATIVE ng/mL
BENZODIAZ UR QL: NEGATIVE ng/mL
Barbiturate: NEGATIVE ng/mL
Cannabinoid Quant, Ur: NEGATIVE ng/mL
Cocaine (Metabolite): NEGATIVE ng/mL
Creatinine: 56.4 mg/dL (ref 20.0–300.0)
Ethanol: NEGATIVE %
Meperidine: NEGATIVE ng/mL
Methadone Screen, Urine: NEGATIVE ng/mL
Phencyclidine: NEGATIVE ng/mL
Propoxyphene: NEGATIVE ng/mL
Tramadol: NEGATIVE ng/mL
pH, Urine: 6.8 (ref 4.5–8.9)

## 2022-01-18 LAB — OXYCODONE/OXYMORPHONE, CONFIRM
OXYCODONE/OXYMORPH: POSITIVE — AB
OXYCODONE: >3000 ng/mL
OXYCODONE: POSITIVE — AB
OXYMORPHONE: NEGATIVE

## 2022-01-18 LAB — OPIATES CONFIRMATION, URINE: Opiates: NEGATIVE ng/mL

## 2022-01-25 ENCOUNTER — Other Ambulatory Visit: Payer: Self-pay | Admitting: Nurse Practitioner

## 2022-01-25 ENCOUNTER — Telehealth: Payer: Self-pay | Admitting: Nurse Practitioner

## 2022-01-25 NOTE — Telephone Encounter (Signed)
Oxycodone refill request- pt is aware not due until Friday but wanted to go ahead and put request in so he is able to pick it up Friday.

## 2022-01-26 ENCOUNTER — Other Ambulatory Visit: Payer: Self-pay | Admitting: Nurse Practitioner

## 2022-01-26 DIAGNOSIS — D57219 Sickle-cell/Hb-C disease with crisis, unspecified: Secondary | ICD-10-CM

## 2022-01-26 DIAGNOSIS — G894 Chronic pain syndrome: Secondary | ICD-10-CM

## 2022-01-26 MED ORDER — OXYCODONE HCL 30 MG PO TABS: 30.0000 mg | ORAL_TABLET | Freq: Four times a day (QID) | ORAL | 0 refills | Status: DC | PRN

## 2022-02-09 ENCOUNTER — Other Ambulatory Visit: Payer: Self-pay | Admitting: Nurse Practitioner

## 2022-02-09 DIAGNOSIS — G894 Chronic pain syndrome: Secondary | ICD-10-CM

## 2022-02-09 DIAGNOSIS — R52 Pain, unspecified: Secondary | ICD-10-CM

## 2022-02-09 DIAGNOSIS — D571 Sickle-cell disease without crisis: Secondary | ICD-10-CM

## 2022-02-09 MED ORDER — IBUPROFEN 800 MG PO TABS: 800.0000 mg | ORAL_TABLET | Freq: Three times a day (TID) | ORAL | 0 refills | Status: DC | PRN

## 2022-02-10 ENCOUNTER — Other Ambulatory Visit: Payer: Self-pay | Admitting: Nurse Practitioner

## 2022-02-10 DIAGNOSIS — G894 Chronic pain syndrome: Secondary | ICD-10-CM

## 2022-02-10 DIAGNOSIS — D57219 Sickle-cell/Hb-C disease with crisis, unspecified: Secondary | ICD-10-CM

## 2022-02-10 MED ORDER — OXYCODONE HCL 30 MG PO TABS: 30.0000 mg | ORAL_TABLET | Freq: Four times a day (QID) | ORAL | 0 refills | Status: DC | PRN

## 2022-02-23 ENCOUNTER — Other Ambulatory Visit: Payer: Self-pay

## 2022-02-23 DIAGNOSIS — D57219 Sickle-cell/Hb-C disease with crisis, unspecified: Secondary | ICD-10-CM

## 2022-02-23 DIAGNOSIS — G894 Chronic pain syndrome: Secondary | ICD-10-CM

## 2022-02-23 NOTE — Telephone Encounter (Signed)
From: Leanor Kail Ernsberger To: Office of Fenton Foy, NP Sent: 02/23/2022 9:24 AM EST Subject: Medication Renewal Request  Refills have been requested for the following medications:   oxycodone (ROXICODONE) 30 MG immediate release tablet Kenney Houseman S [place]]  Preferred pharmacy: [id number] [place], [address] Delivery method: [employer]

## 2022-02-24 MED ORDER — OXYCODONE HCL 30 MG PO TABS: 30.0000 mg | ORAL_TABLET | Freq: Four times a day (QID) | ORAL | 0 refills | Status: DC | PRN

## 2022-03-09 ENCOUNTER — Other Ambulatory Visit: Payer: Self-pay

## 2022-03-09 DIAGNOSIS — G894 Chronic pain syndrome: Secondary | ICD-10-CM

## 2022-03-09 DIAGNOSIS — D57219 Sickle-cell/Hb-C disease with crisis, unspecified: Secondary | ICD-10-CM

## 2022-03-09 NOTE — Telephone Encounter (Signed)
From: Dean Webb To: Office of Dean Foy, Dean Webb Sent: 03/09/2022 1:47 PM EST Subject: Medication Renewal Request  Refills have been requested for the following medications:   oxycodone (ROXICODONE) 30 MG immediate release tablet Dean Webb]  Preferred pharmacy: [id number] Dean Webb, [place] [address] Delivery method: [employer]

## 2022-03-10 MED ORDER — OXYCODONE HCL 30 MG PO TABS: 30.0000 mg | ORAL_TABLET | Freq: Four times a day (QID) | ORAL | 0 refills | Status: DC | PRN

## 2022-03-24 ENCOUNTER — Telehealth: Payer: Self-pay | Admitting: Nurse Practitioner

## 2022-03-24 ENCOUNTER — Other Ambulatory Visit: Payer: Self-pay

## 2022-03-24 DIAGNOSIS — G894 Chronic pain syndrome: Secondary | ICD-10-CM

## 2022-03-24 DIAGNOSIS — D57219 Sickle-cell/Hb-C disease with crisis, unspecified: Secondary | ICD-10-CM

## 2022-03-24 MED ORDER — OXYCODONE HCL 30 MG PO TABS: 30.0000 mg | ORAL_TABLET | Freq: Four times a day (QID) | ORAL | 0 refills | Status: DC | PRN

## 2022-03-24 NOTE — Telephone Encounter (Signed)
Please advise KH 

## 2022-03-24 NOTE — Telephone Encounter (Signed)
Oxycodone refill request.

## 2022-04-03 ENCOUNTER — Encounter: Payer: Self-pay | Admitting: Pharmacist

## 2022-04-06 ENCOUNTER — Other Ambulatory Visit: Payer: Self-pay

## 2022-04-06 DIAGNOSIS — D57219 Sickle-cell/Hb-C disease with crisis, unspecified: Secondary | ICD-10-CM

## 2022-04-06 DIAGNOSIS — D571 Sickle-cell disease without crisis: Secondary | ICD-10-CM

## 2022-04-06 DIAGNOSIS — G894 Chronic pain syndrome: Secondary | ICD-10-CM

## 2022-04-06 DIAGNOSIS — I10 Essential (primary) hypertension: Secondary | ICD-10-CM

## 2022-04-06 DIAGNOSIS — R52 Pain, unspecified: Secondary | ICD-10-CM

## 2022-04-06 NOTE — Telephone Encounter (Signed)
From: Leanor Kail Nestle To: Office of Cammie Sickle, [place] Sent: 04/06/2022 8:09 AM EST Subject: Medication Renewal Request  Refills have been requested for the following medications:   cloNIDine (CATAPRES) 0.1 MG tablet [Lachina Hollis]  Preferred pharmacy: [id number] [place], [place] [address] Delivery method: Pickup   Medication renewals requested in this message routed separately:   ibuprofen (ADVIL) 800 MG tablet [Tonya S Nichols]   oxycodone (ROXICODONE) 30 MG immediate release tablet [Tonya S Nichols]

## 2022-04-06 NOTE — Telephone Encounter (Signed)
Please advise of the clonidine due to last refusal.

## 2022-04-07 ENCOUNTER — Other Ambulatory Visit: Payer: Self-pay | Admitting: Nurse Practitioner

## 2022-04-07 DIAGNOSIS — G894 Chronic pain syndrome: Secondary | ICD-10-CM

## 2022-04-07 DIAGNOSIS — R52 Pain, unspecified: Secondary | ICD-10-CM

## 2022-04-07 DIAGNOSIS — T7840XA Allergy, unspecified, initial encounter: Secondary | ICD-10-CM

## 2022-04-07 DIAGNOSIS — I10 Essential (primary) hypertension: Secondary | ICD-10-CM

## 2022-04-07 DIAGNOSIS — D571 Sickle-cell disease without crisis: Secondary | ICD-10-CM

## 2022-04-07 DIAGNOSIS — D57219 Sickle-cell/Hb-C disease with crisis, unspecified: Secondary | ICD-10-CM

## 2022-04-07 MED ORDER — IBUPROFEN 800 MG PO TABS: 800.0000 mg | ORAL_TABLET | Freq: Three times a day (TID) | ORAL | 0 refills | Status: DC | PRN

## 2022-04-07 MED ORDER — CLONIDINE HCL 0.1 MG PO TABS: 0.1000 mg | ORAL_TABLET | Freq: Two times a day (BID) | ORAL | 0 refills | Status: DC

## 2022-04-07 MED ORDER — OXYCODONE HCL 30 MG PO TABS: 30.0000 mg | ORAL_TABLET | Freq: Four times a day (QID) | ORAL | 0 refills | Status: DC | PRN

## 2022-04-07 NOTE — Telephone Encounter (Signed)
Caller & Relationship to patient:  MRN #  [id number]   Call Back Number:   Date of Last Office Visit: 03/24/2022     Date of Next Office Visit: 04/14/2022    Medication(s) to be Refilled: Oxycodone   Preferred Pharmacy:   ** Please notify patient to allow 48-72 hours to process** **Let patient know to contact pharmacy at the end of the day to make sure medication is ready. ** **If patient has not been seen in a year or longer, book an appointment **Advise to use MyChart for refill requests OR to contact their pharmacy

## 2022-04-14 ENCOUNTER — Ambulatory Visit: Payer: Self-pay | Admitting: Nurse Practitioner

## 2022-04-14 ENCOUNTER — Non-Acute Institutional Stay: Admit: 2022-04-14 | Payer: Medicaid Other | Admitting: Internal Medicine

## 2022-04-17 ENCOUNTER — Ambulatory Visit: Payer: 59 | Admitting: Nurse Practitioner

## 2022-04-17 ENCOUNTER — Encounter: Payer: Self-pay | Admitting: Nurse Practitioner

## 2022-04-17 VITALS — BP 138/89 | HR 98 | Temp 98.8°F | Ht 67.0 in | Wt 208.2 lb

## 2022-04-17 DIAGNOSIS — G8929 Other chronic pain: Secondary | ICD-10-CM | POA: Diagnosis not present

## 2022-04-17 DIAGNOSIS — J4520 Mild intermittent asthma, uncomplicated: Secondary | ICD-10-CM | POA: Diagnosis not present

## 2022-04-17 DIAGNOSIS — T7840XA Allergy, unspecified, initial encounter: Secondary | ICD-10-CM

## 2022-04-17 DIAGNOSIS — Z79891 Long term (current) use of opiate analgesic: Secondary | ICD-10-CM | POA: Diagnosis not present

## 2022-04-17 DIAGNOSIS — M5441 Lumbago with sciatica, right side: Secondary | ICD-10-CM

## 2022-04-17 DIAGNOSIS — I10 Essential (primary) hypertension: Secondary | ICD-10-CM

## 2022-04-17 DIAGNOSIS — Z1211 Encounter for screening for malignant neoplasm of colon: Secondary | ICD-10-CM

## 2022-04-17 DIAGNOSIS — D5720 Sickle-cell/Hb-C disease without crisis: Secondary | ICD-10-CM | POA: Diagnosis not present

## 2022-04-17 DIAGNOSIS — K219 Gastro-esophageal reflux disease without esophagitis: Secondary | ICD-10-CM

## 2022-04-17 MED ORDER — LOSARTAN POTASSIUM-HCTZ 100-25 MG PO TABS: 1.0000 | ORAL_TABLET | Freq: Every day | ORAL | 0 refills | Status: DC

## 2022-04-17 MED ORDER — DULOXETINE HCL 30 MG PO CPEP: 30.0000 mg | ORAL_CAPSULE | Freq: Every day | ORAL | 1 refills | Status: DC

## 2022-04-17 MED ORDER — TIZANIDINE HCL 4 MG PO TABS: 4.0000 mg | ORAL_TABLET | Freq: Four times a day (QID) | ORAL | 0 refills | Status: DC | PRN

## 2022-04-17 MED ORDER — PANTOPRAZOLE SODIUM 40 MG PO TBEC: 40.0000 mg | DELAYED_RELEASE_TABLET | Freq: Every day | ORAL | 3 refills | Status: AC

## 2022-04-17 MED ORDER — MONTELUKAST SODIUM 10 MG PO TABS: ORAL_TABLET | ORAL | 1 refills | Status: AC

## 2022-04-17 MED ORDER — CETIRIZINE HCL 10 MG PO TABS: ORAL_TABLET | ORAL | 1 refills | Status: AC

## 2022-04-17 MED ORDER — METOPROLOL SUCCINATE ER 50 MG PO TB24: 50.0000 mg | ORAL_TABLET | Freq: Every day | ORAL | 3 refills | Status: AC

## 2022-04-17 MED ORDER — AMLODIPINE BESYLATE 10 MG PO TABS: 10.0000 mg | ORAL_TABLET | Freq: Every day | ORAL | 0 refills | Status: DC

## 2022-04-17 NOTE — Progress Notes (Signed)
$'@Patient'g$  ID: Dean Webb, male    DOB: 03/06/1975, 47 y.o.   MRN: [id number]  Chief Complaint  Patient presents with   Follow-up    Referring provider: Fenton Foy, NP   HPI   Patient presents today for sickle cell follow-up.  Overall he has been doing well.  He does need blood work today.  He is due for colon cancer screening.  Patient does complain of back pain which is chronic.  We will check x-ray today.  We will order muscle relaxer.  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   cloNIDine (CATAPRES) 0.1 MG tablet Take 1 tablet (0.1 mg total) by mouth 2 (two) times daily.   ibuprofen (ADVIL) 800 MG tablet Take 1 tablet (800 mg total) by mouth every 8 (eight) hours as needed.   oxycodone (ROXICODONE) 30 MG immediate release tablet Take 1 tablet (30 mg total) by mouth every 6 (six) hours as needed for pain.   tiZANidine (ZANAFLEX) 4 MG tablet Take 1 tablet (4 mg total) by mouth every 6 (six) hours as needed for muscle  spasms.   Vitamin D, Ergocalciferol, (DRISDOL) 1.25 MG (50000 UNIT) CAPS capsule Take 1 capsule (50,000 Units total) by mouth every 7 (seven) days.   [DISCONTINUED] amLODipine (NORVASC) 10 MG tablet Take 1 tablet (10 mg total) by mouth daily.   [DISCONTINUED] cetirizine (ZYRTEC) 10 MG tablet TAKE 1 TABLET BY MOUTH EVERY DAY AS NEEDED FOR ALLERGY   [DISCONTINUED] DULoxetine (CYMBALTA) 30 MG capsule Take 1 capsule (30 mg total) by mouth daily.   [DISCONTINUED] losartan-hydrochlorothiazide (HYZAAR) 100-25 MG tablet Take 1 tablet by mouth daily.   [DISCONTINUED] metoprolol succinate (TOPROL-XL) 50 MG 24 hr tablet Take 1 tablet (50 mg total) by mouth daily. Take with or immediately following a meal.   [DISCONTINUED] montelukast (SINGULAIR) 10 MG tablet TAKE 1 TABLET BY MOUTH EVERYDAY AT BEDTIME   [DISCONTINUED] pantoprazole (PROTONIX) 40 MG tablet Take 1 tablet (40 mg total) by mouth daily.   amLODipine (NORVASC) 10 MG tablet Take 1 tablet (10 mg total) by mouth daily.   Blood Pressure Monitoring (OMRON 3 SERIES BP MONITOR) DEVI 1 kit by Does not apply route daily. (Patient not taking: Reported on 04/17/2022)   Blood Pressure Monitoring (OMRON 3 SERIES BP MONITOR) DEVI 1 each by Does not apply route in the morning and at bedtime. (Patient not taking: Reported on 10/12/2021)   cetirizine (ZYRTEC) 10 MG tablet TAKE 1 TABLET BY MOUTH EVERY  DAY AS NEEDED FOR ALLERGY   DULoxetine (CYMBALTA) 30 MG capsule Take 1 capsule (30 mg total) by mouth daily.   losartan-hydrochlorothiazide (HYZAAR) 100-25 MG tablet Take 1 tablet by mouth daily.   metoprolol succinate (TOPROL-XL) 50 MG 24 hr tablet Take 1 tablet (50 mg total) by mouth daily. Take with or immediately following a meal.   montelukast (SINGULAIR) 10 MG tablet TAKE 1 TABLET BY MOUTH EVERYDAY AT BEDTIME   morphine (AVINZA) 60 MG 24 hr capsule 1 cap(s) orally once a day (Patient not taking: Reported on 10/12/2021)   pantoprazole (PROTONIX) 40 MG tablet Take 1  tablet (40 mg total) by mouth daily.   Facility-Administered Encounter Medications as of 04/17/2022  Medication   cloNIDine (CATAPRES) tablet 0.3 mg     Review of Systems  Review of Systems  Constitutional: Negative.   HENT: Negative.    Cardiovascular: Negative.   Gastrointestinal: Negative.   Musculoskeletal:  Positive for back pain.  Allergic/Immunologic: Negative.   Neurological: Negative.   Psychiatric/Behavioral: Negative.         Physical Exam  BP 138/89   Pulse 98   Temp 98.8 F (37.1 C)   Ht '5\' 7"'$  (1.702 m)   Wt 208 lb 3.2 oz (94.4 kg)   SpO2 100%   BMI 32.61 kg/m   Wt Readings from Last 5 Encounters:  04/17/22 208 lb 3.2 oz (94.4 kg)  01/12/22 202 lb 3.2 oz (91.7 kg)  10/12/21 205 lb 3.2 oz (93.1 kg)  07/18/21 199 lb 9.6 oz (90.5 kg)  06/15/21 198 lb (89.8 kg)     Physical Exam Vitals and nursing note reviewed.  Constitutional:      General: He is not in acute distress.    Appearance: He is well-developed.  Cardiovascular:     Rate and Rhythm: Normal rate and regular rhythm.  Pulmonary:     Effort: Pulmonary effort is normal.     Breath sounds: Normal breath sounds.  Skin:    General: Skin is warm and dry.  Neurological:     Mental Status: He is alert and oriented to person, place, and time.      Lab Results:  CBC    Component Value Date/Time   WBC 9.1 01/12/2022 1625   WBC 8.2 12/01/2019 0548   RBC 4.91 01/12/2022 1625   RBC 4.30 12/01/2019 0548   HGB 14.5 01/12/2022 1625   HCT 41.7 01/12/2022 1625   PLT 338 01/12/2022 1625   MCV 85 01/12/2022 1625   MCH 29.5 01/12/2022 1625   MCH 27.4 12/01/2019 0548   MCHC 34.8 01/12/2022 1625   MCHC 36.8 (H) 12/01/2019 0548   RDW 15.8 (H) 01/12/2022 1625   LYMPHSABS 4.3 (H) 01/12/2022 1625   MONOABS 1.3 (H) 12/01/2019 0548   EOSABS 0.5 (H) 01/12/2022 1625   BASOSABS 0.1 01/12/2022 1625    BMET    Component Value Date/Time   NA 139 01/12/2022 1625   K 4.5 01/12/2022 1625   CL 103  01/12/2022 1625   CO2 23 01/12/2022 1625   GLUCOSE 97 01/12/2022 1625   GLUCOSE 98 12/01/2019 0548   BUN 12 01/12/2022 1625   CREATININE 1.28 (H) 01/12/2022 1625   CALCIUM 11.1 (H) 01/12/2022 1625   GFRNONAA 77 03/22/2020 1226   GFRNONAA >60 12/01/2019 0548   GFRAA 89 03/22/2020 1226    BNP No results found for: "BNP"  ProBNP No results found for: "PROBNP"  Imaging: No results found.   Assessment & Plan:  Essential hypertension - amLODipine (NORVASC) 10 MG tablet; Take 1 tablet (10 mg total) by mouth daily.  Dispense: 90 tablet; Refill: 0 - metoprolol succinate (TOPROL-XL) 50 MG 24 hr tablet; Take 1 tablet (50 mg total) by mouth daily. Take with or immediately following a meal.  Dispense: 90 tablet; Refill: 3 - losartan-hydrochlorothiazide (HYZAAR) 100-25 MG tablet; Take 1 tablet by mouth daily.  Dispense: 90 tablet; Refill: 0  2. Gastroesophageal reflux disease without esophagitis  - pantoprazole (PROTONIX) 40 MG tablet; Take 1 tablet (40 mg total) by mouth daily.  Dispense: 30 tablet; Refill: 3  3. Allergy, initial encounter  - montelukast (SINGULAIR) 10 MG tablet; TAKE 1 TABLET BY MOUTH EVERYDAY AT BEDTIME  Dispense: 90 tablet; Refill: 1  4. Mild intermittent asthma without complication  - cetirizine (ZYRTEC) 10 MG tablet; TAKE 1 TABLET BY MOUTH EVERY DAY AS NEEDED FOR ALLERGY  Dispense: 90 tablet; Refill: 1  5. Chronic right-sided low back pain with right-sided sciatica  - DG Lumbar Spine Complete  Follow up:     Fenton Foy, NP 04/17/2022

## 2022-04-17 NOTE — Assessment & Plan Note (Signed)
-   amLODipine (NORVASC) 10 MG tablet; Take 1 tablet (10 mg total) by mouth daily.  Dispense: 90 tablet; Refill: 0 - metoprolol succinate (TOPROL-XL) 50 MG 24 hr tablet; Take 1 tablet (50 mg total) by mouth daily. Take with or immediately following a meal.  Dispense: 90 tablet; Refill: 3 - losartan-hydrochlorothiazide (HYZAAR) 100-25 MG tablet; Take 1 tablet by mouth daily.  Dispense: 90 tablet; Refill: 0  2. Gastroesophageal reflux disease without esophagitis  - pantoprazole (PROTONIX) 40 MG tablet; Take 1 tablet (40 mg total) by mouth daily.  Dispense: 30 tablet; Refill: 3  3. Allergy, initial encounter  - montelukast (SINGULAIR) 10 MG tablet; TAKE 1 TABLET BY MOUTH EVERYDAY AT BEDTIME  Dispense: 90 tablet; Refill: 1  4. Mild intermittent asthma without complication  - cetirizine (ZYRTEC) 10 MG tablet; TAKE 1 TABLET BY MOUTH EVERY DAY AS NEEDED FOR ALLERGY  Dispense: 90 tablet; Refill: 1  5. Chronic right-sided low back pain with right-sided sciatica  - DG Lumbar Spine Complete  Follow up:

## 2022-04-17 NOTE — Patient Instructions (Addendum)
1. Essential hypertension  - amLODipine (NORVASC) 10 MG tablet; Take 1 tablet (10 mg total) by mouth daily.  Dispense: 90 tablet; Refill: 0 - metoprolol succinate (TOPROL-XL) 50 MG 24 hr tablet; Take 1 tablet (50 mg total) by mouth daily. Take with or immediately following a meal.  Dispense: 90 tablet; Refill: 3 - losartan-hydrochlorothiazide (HYZAAR) 100-25 MG tablet; Take 1 tablet by mouth daily.  Dispense: 90 tablet; Refill: 0  2. Gastroesophageal reflux disease without esophagitis  - pantoprazole (PROTONIX) 40 MG tablet; Take 1 tablet (40 mg total) by mouth daily.  Dispense: 30 tablet; Refill: 3  3. Allergy, initial encounter  - montelukast (SINGULAIR) 10 MG tablet; TAKE 1 TABLET BY MOUTH EVERYDAY AT BEDTIME  Dispense: 90 tablet; Refill: 1  4. Mild intermittent asthma without complication  - cetirizine (ZYRTEC) 10 MG tablet; TAKE 1 TABLET BY MOUTH EVERY DAY AS NEEDED FOR ALLERGY  Dispense: 90 tablet; Refill: 1  5. Chronic right-sided low back pain with right-sided sciatica  - DG Lumbar Spine Complete  Follow up:  Follow up in 3 months

## 2022-04-21 ENCOUNTER — Telehealth: Payer: Self-pay | Admitting: Nurse Practitioner

## 2022-04-21 NOTE — Telephone Encounter (Signed)
Caller & Relationship to patient:  MRN #  [id number]   Call Back Number:   Date of Last Office Visit: 04/17/2022     Date of Next Office Visit: 07/17/2022    Medication(s) to be Refilled: oxycodone   Preferred Pharmacy:   ** Please notify patient to allow 48-72 hours to process** **Let patient know to contact pharmacy at the end of the day to make sure medication is ready. ** **If patient has not been seen in a year or longer, book an appointment **Advise to use MyChart for refill requests OR to contact their pharmacy

## 2022-04-21 NOTE — Progress Notes (Signed)
Pt has seen in my chart. [place]

## 2022-04-24 ENCOUNTER — Other Ambulatory Visit: Payer: Self-pay | Admitting: Nurse Practitioner

## 2022-04-24 DIAGNOSIS — D57219 Sickle-cell/Hb-C disease with crisis, unspecified: Secondary | ICD-10-CM

## 2022-04-24 DIAGNOSIS — G894 Chronic pain syndrome: Secondary | ICD-10-CM

## 2022-04-24 MED ORDER — OXYCODONE HCL 30 MG PO TABS: 30.0000 mg | ORAL_TABLET | Freq: Four times a day (QID) | ORAL | 0 refills | Status: DC | PRN

## 2022-04-26 ENCOUNTER — Other Ambulatory Visit: Payer: Self-pay

## 2022-04-26 DIAGNOSIS — G894 Chronic pain syndrome: Secondary | ICD-10-CM

## 2022-04-26 DIAGNOSIS — D57219 Sickle-cell/Hb-C disease with crisis, unspecified: Secondary | ICD-10-CM

## 2022-04-26 NOTE — Telephone Encounter (Signed)
Please advise KH 

## 2022-04-26 NOTE — Telephone Encounter (Signed)
Done 04/24/22

## 2022-04-26 NOTE — Telephone Encounter (Signed)
From: Leanor Kail Bechtold To: Office of Fenton Foy, NP Sent: 04/21/2022 8:40 AM EST Subject: Medication Renewal Request  Refills have been requested for the following medications:   oxycodone (ROXICODONE) 30 MG immediate release tablet Kenney Houseman S Nichols]  [id number] [address] Delivery method: [employer]

## 2022-04-27 LAB — CMP14+CBC/D/PLT+FER+RETIC+V...
ALT: 21 IU/L (ref 0–44)
AST: 23 IU/L (ref 0–40)
Albumin/Globulin Ratio: 1.8 (ref 1.2–2.2)
Albumin: 4.6 g/dL (ref 4.1–5.1)
Alkaline Phosphatase: 94 IU/L (ref 44–121)
BUN/Creatinine Ratio: 7 — ABNORMAL LOW (ref 9–20)
BUN: 9 mg/dL (ref 6–24)
Basophils Absolute: 0.1 10*3/uL (ref 0.0–0.2)
Basos: 1 %
Bilirubin Total: 0.9 mg/dL (ref 0.0–1.2)
CO2: 23 mmol/L (ref 20–29)
Calcium: 10.7 mg/dL — ABNORMAL HIGH (ref 8.7–10.2)
Chloride: 99 mmol/L (ref 96–106)
Creatinine, Ser: 1.3 mg/dL — ABNORMAL HIGH (ref 0.76–1.27)
EOS (ABSOLUTE): 0.5 10*3/uL — ABNORMAL HIGH (ref 0.0–0.4)
Eos: 5 %
Ferritin: 125 ng/mL (ref 30–400)
Globulin, Total: 2.5 g/dL (ref 1.5–4.5)
Glucose: 93 mg/dL (ref 70–99)
Hematocrit: 39.8 % (ref 37.5–51.0)
Hemoglobin: 13.6 g/dL (ref 13.0–17.7)
Immature Grans (Abs): 0 10*3/uL (ref 0.0–0.1)
Immature Granulocytes: 0 %
Lymphocytes Absolute: 3.9 10*3/uL — ABNORMAL HIGH (ref 0.7–3.1)
Lymphs: 40 %
MCH: 29.1 pg (ref 26.6–33.0)
MCHC: 34.2 g/dL (ref 31.5–35.7)
MCV: 85 fL (ref 79–97)
Monocytes Absolute: 1.1 10*3/uL — ABNORMAL HIGH (ref 0.1–0.9)
Monocytes: 11 %
NRBC: 2 % — ABNORMAL HIGH (ref 0–0)
Neutrophils Absolute: 4.2 10*3/uL (ref 1.4–7.0)
Neutrophils: 43 %
Platelets: 375 10*3/uL (ref 150–450)
Potassium: 3.8 mmol/L (ref 3.5–5.2)
RBC: 4.67 x10E6/uL (ref 4.14–5.80)
RDW: 16.3 % — ABNORMAL HIGH (ref 11.6–15.4)
Retic Ct Pct: 3.6 % — ABNORMAL HIGH (ref 0.6–2.6)
Sodium: 136 mmol/L (ref 134–144)
Total Protein: 7.1 g/dL (ref 6.0–8.5)
Vit D, 25-Hydroxy: 19.1 ng/mL — ABNORMAL LOW (ref 30.0–100.0)
WBC: 9.8 10*3/uL (ref 3.4–10.8)
eGFR: 69 mL/min/{1.73_m2} (ref >59)

## 2022-04-27 LAB — DRUG SCREEN 11 W/CONF, SE
Amphetamines, IA: NEGATIVE ng/mL
Barbiturates, IA: NEGATIVE ug/mL
Benzodiazepines, IA: NEGATIVE ng/mL
Cocaine & Metabolite, IA: NEGATIVE ng/mL
Ethyl Alcohol, Enz: NEGATIVE gm/dL
Methadone, IA: NEGATIVE ng/mL
Opiates, IA: NEGATIVE ng/mL
Oxycodones, IA: NEGATIVE ng/mL
Phencyclidine, IA: NEGATIVE ng/mL
Propoxyphene, IA: NEGATIVE ng/mL
THC(Marijuana) Metabolite, IA: NEGATIVE ng/mL

## 2022-04-27 NOTE — Telephone Encounter (Signed)
Done KH 

## 2022-05-08 ENCOUNTER — Other Ambulatory Visit: Payer: Self-pay | Admitting: Nurse Practitioner

## 2022-05-08 ENCOUNTER — Other Ambulatory Visit: Payer: Self-pay

## 2022-05-08 ENCOUNTER — Other Ambulatory Visit: Payer: Self-pay | Admitting: Family Medicine

## 2022-05-08 ENCOUNTER — Telehealth: Payer: Self-pay | Admitting: Nurse Practitioner

## 2022-05-08 DIAGNOSIS — D57219 Sickle-cell/Hb-C disease with crisis, unspecified: Secondary | ICD-10-CM

## 2022-05-08 DIAGNOSIS — G894 Chronic pain syndrome: Secondary | ICD-10-CM

## 2022-05-08 MED ORDER — OXYCODONE HCL 30 MG PO TABS: 30.0000 mg | ORAL_TABLET | Freq: Four times a day (QID) | ORAL | 0 refills | Status: DC | PRN

## 2022-05-08 NOTE — Telephone Encounter (Signed)
Caller & Relationship to patient:  MRN #  [id number]   Call Back Number:   Date of Last Office Visit: 05/08/2022     Date of Next Office Visit: 07/17/2022    Medication(s) to be Refilled: Oxycodone   Preferred Pharmacy:   ** Please notify patient to allow 48-72 hours to process** **Let patient know to contact pharmacy at the end of the day to make sure medication is ready. ** **If patient has not been seen in a year or longer, book an appointment **Advise to use MyChart for refill requests OR to contact their pharmacy

## 2022-05-08 NOTE — Telephone Encounter (Signed)
Please advise KH 

## 2022-05-08 NOTE — Telephone Encounter (Signed)
From: Leanor Kail Manner To: Office of Fenton Foy, NP Sent: 05/08/2022 9:47 AM EDT Subject: Medication Renewal Request  Refills have been requested for the following medications:   oxycodone (ROXICODONE) 30 MG immediate release tablet Kenney Houseman S Nichols]  Preferred pharmacy: [id number] [address], [address] Delivery method: [employer]

## 2022-05-08 NOTE — Progress Notes (Signed)
Reviewed PDMP substance reporting system prior to prescribing opiate medications. No inconsistencies noted.    Meds ordered this encounter  Medications   oxycodone (ROXICODONE) 30 MG immediate release tablet    Sig: Take 1 tablet (30 mg total) by mouth every 6 (six) hours as needed for pain.    Dispense:  60 tablet    Refill:  0    Order Specific Question:   Supervising Provider    Answer:   JEGEDE, OLUGBEMIGA E [[id number]]     Dean Hucker Moore Zasha Belleau  APRN, MSN, FNP-C [facility] [address]  [place], [place] [address] [phone]  

## 2022-05-22 ENCOUNTER — Other Ambulatory Visit: Payer: Self-pay

## 2022-05-22 DIAGNOSIS — D57219 Sickle-cell/Hb-C disease with crisis, unspecified: Secondary | ICD-10-CM

## 2022-05-22 DIAGNOSIS — G894 Chronic pain syndrome: Secondary | ICD-10-CM

## 2022-05-22 MED ORDER — OXYCODONE HCL 30 MG PO TABS: 30.0000 mg | ORAL_TABLET | Freq: Four times a day (QID) | ORAL | 0 refills | Status: DC | PRN

## 2022-05-22 NOTE — Telephone Encounter (Signed)
From: Granville Lewis Kloosterman To: Office of Julianne Handler, [place] Sent: 05/19/2022 12:29 PM EDT Subject: Medication Renewal Request  Refills have been requested for the following medications:   oxycodone (ROXICODONE) 30 MG immediate release tablet [Lachina Hollis]  Patient Comment: Pick up for Saturday   Preferred pharmacy: [id number] - [place], [place] - [address] Delivery method: [employer]

## 2022-05-22 NOTE — Telephone Encounter (Signed)
Please advise KH 

## 2022-06-05 ENCOUNTER — Other Ambulatory Visit: Payer: Self-pay | Admitting: Nurse Practitioner

## 2022-06-05 ENCOUNTER — Other Ambulatory Visit: Payer: Self-pay

## 2022-06-05 ENCOUNTER — Telehealth: Payer: Self-pay | Admitting: Nurse Practitioner

## 2022-06-05 DIAGNOSIS — D57219 Sickle-cell/Hb-C disease with crisis, unspecified: Secondary | ICD-10-CM

## 2022-06-05 DIAGNOSIS — G894 Chronic pain syndrome: Secondary | ICD-10-CM

## 2022-06-05 MED ORDER — OXYCODONE HCL 30 MG PO TABS: 30.0000 mg | ORAL_TABLET | Freq: Four times a day (QID) | ORAL | 0 refills | Status: DC | PRN

## 2022-06-05 NOTE — Telephone Encounter (Signed)
Please advise KH 

## 2022-06-05 NOTE — Telephone Encounter (Signed)
Caller & Relationship to patient:  pt MRN #  [id number]   Call Back Number:   Date of Last Office Visit: 06/05/2022     Date of Next Office Visit: 07/17/2022    Medication(s) to be Refilled: oxycodone   Preferred Pharmacy:   ** Please notify patient to allow 48-72 hours to process** **Let patient know to contact pharmacy at the end of the day to make sure medication is ready. ** **If patient has not been seen in a year or longer, book an appointment **Advise to use MyChart for refill requests OR to contact their pharmacy

## 2022-06-05 NOTE — Telephone Encounter (Signed)
From: Granville Lewis Lafontaine To: Office of Ivonne Andrew, NP Sent: 06/04/2022 3:57 PM EDT Subject: Medication Renewal Request  Refills have been requested for the following medications:   oxycodone (ROXICODONE) 30 MG immediate release tablet Dean Webb]  Preferred pharmacy: [id number] [place], [place] - [address] Delivery method: [employer]

## 2022-06-19 ENCOUNTER — Telehealth: Payer: Self-pay | Admitting: Nurse Practitioner

## 2022-06-19 ENCOUNTER — Other Ambulatory Visit: Payer: Self-pay | Admitting: Nurse Practitioner

## 2022-06-19 DIAGNOSIS — D57219 Sickle-cell/Hb-C disease with crisis, unspecified: Secondary | ICD-10-CM

## 2022-06-19 DIAGNOSIS — G894 Chronic pain syndrome: Secondary | ICD-10-CM

## 2022-06-19 MED ORDER — OXYCODONE HCL 30 MG PO TABS: 30.0000 mg | ORAL_TABLET | Freq: Four times a day (QID) | ORAL | 0 refills | Status: DC | PRN

## 2022-06-19 MED ORDER — TIZANIDINE HCL 4 MG PO TABS: 4.0000 mg | ORAL_TABLET | Freq: Four times a day (QID) | ORAL | 0 refills | Status: DC | PRN

## 2022-06-19 NOTE — Telephone Encounter (Signed)
Caller & Relationship to patient:  MRN #  [id number]   Call Back Number:   Date of Last Office Visit: 06/05/2022     Date of Next Office Visit: 07/17/2022    Medication(s) to be Refilled: Oxycodone muscle spams med's to be refilled  Preferred Pharmacy:   ** Please notify patient to allow 48-72 hours to process** **Let patient know to contact pharmacy at the end of the day to make sure medication is ready. ** **If patient has not been seen in a year or longer, book an appointment **Advise to use MyChart for refill requests OR to contact their pharmacy

## 2022-06-27 ENCOUNTER — Ambulatory Visit (INDEPENDENT_AMBULATORY_CARE_PROVIDER_SITE_OTHER): Payer: 59 | Admitting: Nurse Practitioner

## 2022-06-27 ENCOUNTER — Inpatient Hospital Stay (HOSPITAL_COMMUNITY)
Admission: EM | Admit: 2022-06-27 | Discharge: 2022-06-29 | DRG: 193 | Disposition: A | Discharge status: Home / Self Care | Payer: 59 | Attending: Internal Medicine | Admitting: Internal Medicine

## 2022-06-27 ENCOUNTER — Encounter: Payer: Self-pay | Admitting: Nurse Practitioner

## 2022-06-27 ENCOUNTER — Other Ambulatory Visit: Payer: Self-pay

## 2022-06-27 ENCOUNTER — Emergency Department (HOSPITAL_COMMUNITY): Payer: 59

## 2022-06-27 VITALS — BP 135/84 | HR 106 | Temp 97.0°F | Ht 67.0 in | Wt 208.2 lb

## 2022-06-27 DIAGNOSIS — D5700 Hb-SS disease with crisis, unspecified: Secondary | ICD-10-CM | POA: Diagnosis present

## 2022-06-27 DIAGNOSIS — K219 Gastro-esophageal reflux disease without esophagitis: Secondary | ICD-10-CM | POA: Diagnosis present

## 2022-06-27 DIAGNOSIS — Z833 Family history of diabetes mellitus: Secondary | ICD-10-CM | POA: Diagnosis not present

## 2022-06-27 DIAGNOSIS — R0602 Shortness of breath: Secondary | ICD-10-CM | POA: Diagnosis not present

## 2022-06-27 DIAGNOSIS — Z8249 Family history of ischemic heart disease and other diseases of the circulatory system: Secondary | ICD-10-CM

## 2022-06-27 DIAGNOSIS — F17200 Nicotine dependence, unspecified, uncomplicated: Secondary | ICD-10-CM | POA: Diagnosis present

## 2022-06-27 DIAGNOSIS — R079 Chest pain, unspecified: Secondary | ICD-10-CM | POA: Diagnosis not present

## 2022-06-27 DIAGNOSIS — D638 Anemia in other chronic diseases classified elsewhere: Secondary | ICD-10-CM | POA: Diagnosis not present

## 2022-06-27 DIAGNOSIS — I10 Essential (primary) hypertension: Secondary | ICD-10-CM | POA: Diagnosis present

## 2022-06-27 DIAGNOSIS — J45909 Unspecified asthma, uncomplicated: Secondary | ICD-10-CM | POA: Diagnosis present

## 2022-06-27 DIAGNOSIS — R051 Acute cough: Secondary | ICD-10-CM | POA: Diagnosis not present

## 2022-06-27 DIAGNOSIS — Z832 Family history of diseases of the blood and blood-forming organs and certain disorders involving the immune mechanism: Secondary | ICD-10-CM | POA: Diagnosis not present

## 2022-06-27 DIAGNOSIS — G894 Chronic pain syndrome: Secondary | ICD-10-CM | POA: Diagnosis present

## 2022-06-27 DIAGNOSIS — F17210 Nicotine dependence, cigarettes, uncomplicated: Secondary | ICD-10-CM | POA: Diagnosis not present

## 2022-06-27 DIAGNOSIS — Z1152 Encounter for screening for COVID-19: Secondary | ICD-10-CM | POA: Diagnosis not present

## 2022-06-27 DIAGNOSIS — Z825 Family history of asthma and other chronic lower respiratory diseases: Secondary | ICD-10-CM | POA: Diagnosis not present

## 2022-06-27 DIAGNOSIS — J4520 Mild intermittent asthma, uncomplicated: Secondary | ICD-10-CM | POA: Diagnosis not present

## 2022-06-27 DIAGNOSIS — D571 Sickle-cell disease without crisis: Principal | ICD-10-CM

## 2022-06-27 DIAGNOSIS — J302 Other seasonal allergic rhinitis: Secondary | ICD-10-CM | POA: Diagnosis present

## 2022-06-27 DIAGNOSIS — D5720 Sickle-cell/Hb-C disease without crisis: Secondary | ICD-10-CM | POA: Diagnosis present

## 2022-06-27 DIAGNOSIS — J189 Pneumonia, unspecified organism: Secondary | ICD-10-CM | POA: Diagnosis not present

## 2022-06-27 DIAGNOSIS — D57219 Sickle-cell/Hb-C disease with crisis, unspecified: Secondary | ICD-10-CM | POA: Diagnosis present

## 2022-06-27 DIAGNOSIS — Z79899 Other long term (current) drug therapy: Secondary | ICD-10-CM

## 2022-06-27 DIAGNOSIS — R0789 Other chest pain: Secondary | ICD-10-CM | POA: Diagnosis not present

## 2022-06-27 LAB — CBC
HCT: 32.4 % — ABNORMAL LOW (ref 39.0–52.0)
Hemoglobin: 12.1 g/dL — ABNORMAL LOW (ref 13.0–17.0)
MCH: 28.6 pg (ref 26.0–34.0)
MCHC: 37.3 g/dL — ABNORMAL HIGH (ref 30.0–36.0)
MCV: 76.6 fL — ABNORMAL LOW (ref 80.0–100.0)
Platelets: 343 10*3/uL (ref 150–400)
RBC: 4.23 MIL/uL (ref 4.22–5.81)
RDW: 16 % — ABNORMAL HIGH (ref 11.5–15.5)
WBC: 14.5 10*3/uL — ABNORMAL HIGH (ref 4.0–10.5)
nRBC: 0.2 % (ref 0.0–0.2)

## 2022-06-27 LAB — POCT INFLUENZA A/B
Influenza A, POC: NEGATIVE
Influenza B, POC: NEGATIVE

## 2022-06-27 LAB — BASIC METABOLIC PANEL
Anion gap: 8 (ref 5–15)
BUN: 9 mg/dL (ref 6–20)
CO2: 26 mmol/L (ref 22–32)
Calcium: 10.5 mg/dL — ABNORMAL HIGH (ref 8.9–10.3)
Chloride: 101 mmol/L (ref 98–111)
Creatinine, Ser: 1.05 mg/dL (ref 0.61–1.24)
GFR, Estimated: >60 mL/min (ref >60)
Glucose, Bld: 116 mg/dL — ABNORMAL HIGH (ref 70–99)
Potassium: 3.6 mmol/L (ref 3.5–5.1)
Sodium: 135 mmol/L (ref 135–145)

## 2022-06-27 LAB — RESP PANEL BY RT-PCR (RSV, FLU A&B, COVID)  RVPGX2
Influenza A by PCR: NEGATIVE
Influenza B by PCR: NEGATIVE
Resp Syncytial Virus by PCR: NEGATIVE
SARS Coronavirus 2 by RT PCR: NEGATIVE

## 2022-06-27 LAB — TSH: TSH: 5.165 u[IU]/mL — ABNORMAL HIGH (ref 0.350–4.500)

## 2022-06-27 LAB — CULTURE, BLOOD (ROUTINE X 2): Special Requests: ADEQUATE

## 2022-06-27 LAB — TROPONIN I (HIGH SENSITIVITY)
Troponin I (High Sensitivity): 7 ng/L (ref <18)
Troponin I (High Sensitivity): 8 ng/L (ref <18)

## 2022-06-27 LAB — LACTIC ACID, PLASMA: Lactic Acid, Venous: 1.5 mmol/L (ref 0.5–1.9)

## 2022-06-27 LAB — POC COVID19 BINAXNOW: SARS Coronavirus 2 Ag: NEGATIVE

## 2022-06-27 MED ORDER — CLONIDINE HCL 0.1 MG PO TABS
0.1000 mg | ORAL_TABLET | Freq: Two times a day (BID) | ORAL | Status: DC
  Administered 2022-06-27 – 2022-06-29 (×3): 0.1 mg via ORAL
  Filled 2022-06-27 (×3): qty 1

## 2022-06-27 MED ORDER — NICOTINE 14 MG/24HR TD PT24
14.0000 mg | MEDICATED_PATCH | Freq: Once | TRANSDERMAL | Status: AC
  Administered 2022-06-27: 14 mg via TRANSDERMAL
  Filled 2022-06-27: qty 1

## 2022-06-27 MED ORDER — HYDROMORPHONE HCL 1 MG/ML IJ SOLN
1.0000 mg | INTRAMUSCULAR | Status: DC | PRN
  Administered 2022-06-27: 1 mg via INTRAVENOUS
  Filled 2022-06-27: qty 1

## 2022-06-27 MED ORDER — HYDROMORPHONE HCL 1 MG/ML IJ SOLN
1.0000 mg | Freq: Once | INTRAMUSCULAR | Status: AC
  Administered 2022-06-27: 1 mg via INTRAVENOUS
  Filled 2022-06-27: qty 1

## 2022-06-27 MED ORDER — LOSARTAN POTASSIUM 50 MG PO TABS
100.0000 mg | ORAL_TABLET | Freq: Every day | ORAL | Status: DC
  Administered 2022-06-27 – 2022-06-29 (×3): 100 mg via ORAL
  Filled 2022-06-27 (×3): qty 2

## 2022-06-27 MED ORDER — ONDANSETRON HCL 4 MG/2ML IJ SOLN: 4.0000 mg | Freq: Four times a day (QID) | INTRAMUSCULAR | Status: DC | PRN

## 2022-06-27 MED ORDER — OXYCODONE HCL 5 MG PO TABS
30.0000 mg | ORAL_TABLET | Freq: Four times a day (QID) | ORAL | Status: DC | PRN
  Administered 2022-06-27 – 2022-06-28 (×2): 30 mg via ORAL
  Filled 2022-06-27 (×2): qty 6

## 2022-06-27 MED ORDER — HYDROCHLOROTHIAZIDE 25 MG PO TABS
25.0000 mg | ORAL_TABLET | Freq: Every day | ORAL | Status: DC
  Administered 2022-06-27 – 2022-06-29 (×3): 25 mg via ORAL
  Filled 2022-06-27 (×3): qty 1

## 2022-06-27 MED ORDER — PANTOPRAZOLE SODIUM 40 MG PO TBEC
40.0000 mg | DELAYED_RELEASE_TABLET | Freq: Every day | ORAL | Status: DC
  Administered 2022-06-27 – 2022-06-29 (×3): 40 mg via ORAL
  Filled 2022-06-27 (×3): qty 1

## 2022-06-27 MED ORDER — ALBUTEROL SULFATE (2.5 MG/3ML) 0.083% IN NEBU: 2.5000 mg | INHALATION_SOLUTION | Freq: Four times a day (QID) | RESPIRATORY_TRACT | Status: DC | PRN

## 2022-06-27 MED ORDER — LACTATED RINGERS IV SOLN: INTRAVENOUS | Status: DC

## 2022-06-27 MED ORDER — ACETAMINOPHEN 500 MG PO TABS
1000.0000 mg | ORAL_TABLET | Freq: Once | ORAL | Status: AC
  Administered 2022-06-27: 1000 mg via ORAL
  Filled 2022-06-27: qty 2

## 2022-06-27 MED ORDER — AMLODIPINE BESYLATE 5 MG PO TABS
10.0000 mg | ORAL_TABLET | Freq: Every day | ORAL | Status: DC
  Administered 2022-06-27 – 2022-06-28 (×2): 10 mg via ORAL
  Filled 2022-06-27 (×2): qty 2

## 2022-06-27 MED ORDER — METOPROLOL SUCCINATE ER 50 MG PO TB24
50.0000 mg | ORAL_TABLET | Freq: Every day | ORAL | Status: DC
  Administered 2022-06-27 – 2022-06-29 (×3): 50 mg via ORAL
  Filled 2022-06-27 (×4): qty 1

## 2022-06-27 MED ORDER — DULOXETINE HCL 30 MG PO CPEP
30.0000 mg | ORAL_CAPSULE | Freq: Every day | ORAL | Status: DC
  Administered 2022-06-28 – 2022-06-29 (×2): 30 mg via ORAL
  Filled 2022-06-27 (×2): qty 1

## 2022-06-27 MED ORDER — DEXTROSE-NACL 5-0.45 % IV SOLN: INTRAVENOUS | Status: DC

## 2022-06-27 MED ORDER — LORATADINE 10 MG PO TABS
10.0000 mg | ORAL_TABLET | Freq: Every day | ORAL | Status: DC
  Administered 2022-06-28 – 2022-06-29 (×2): 10 mg via ORAL
  Filled 2022-06-27 (×2): qty 1

## 2022-06-27 MED ORDER — ONDANSETRON HCL 4 MG/2ML IJ SOLN
4.0000 mg | Freq: Once | INTRAMUSCULAR | Status: AC
  Administered 2022-06-27: 4 mg via INTRAVENOUS
  Filled 2022-06-27: qty 2

## 2022-06-27 MED ORDER — SODIUM CHLORIDE 0.9 % IV SOLN
1.0000 g | Freq: Once | INTRAVENOUS | Status: AC
  Administered 2022-06-27: 1 g via INTRAVENOUS
  Filled 2022-06-27: qty 10

## 2022-06-27 MED ORDER — MONTELUKAST SODIUM 10 MG PO TABS
10.0000 mg | ORAL_TABLET | Freq: Every day | ORAL | Status: DC
  Administered 2022-06-27 – 2022-06-28 (×2): 10 mg via ORAL
  Filled 2022-06-27 (×2): qty 1

## 2022-06-27 MED ORDER — ENOXAPARIN SODIUM 40 MG/0.4ML IJ SOSY
40.0000 mg | PREFILLED_SYRINGE | INTRAMUSCULAR | Status: DC
  Administered 2022-06-28 – 2022-06-29 (×2): 40 mg via SUBCUTANEOUS
  Filled 2022-06-27 (×2): qty 0.4

## 2022-06-27 MED ORDER — SODIUM CHLORIDE 0.9 % IV SOLN
500.0000 mg | Freq: Once | INTRAVENOUS | Status: AC
  Administered 2022-06-27: 500 mg via INTRAVENOUS
  Filled 2022-06-27: qty 5

## 2022-06-27 MED ORDER — LOSARTAN POTASSIUM-HCTZ 100-25 MG PO TABS: 1.0000 | ORAL_TABLET | Freq: Every day | ORAL | Status: DC

## 2022-06-27 NOTE — Assessment & Plan Note (Addendum)
This has been ongoing for the last several years and is mild.  Will get TSH, PTH, ionized calcium. Continued workup following these results. Could be related to thiazide diuretic use or ongoing vitamin D supplementation.Marland Kitchen

## 2022-06-27 NOTE — Assessment & Plan Note (Signed)
Continue albuterol Consider additional inhaled versus systemic steroids if needed.

## 2022-06-27 NOTE — Assessment & Plan Note (Signed)
Continue amlodipine, clonidine, Hyzaar, Toprol

## 2022-06-27 NOTE — Hospital Course (Signed)
Dean Webb is a 47 y.o. male with medical history significant of sickle C disease, HTN, GERD who presents with several day history of cough and left-sided chest pain.  He has ongoing tobacco use and smokes approximately 1/2 pack/day.  He reports subjective fevers at home and is coughing up clear sputum.  He denies hemoptysis.  He denies sick contacts.  In the ED he is noted to have left lower lobe consolidation consistent with pneumonia versus acute chest.  His lactic acid is noted to be normal.  He has an elevated WBC at 14.5.  Patient has a normal lactic acid and troponins.  He is admitted with IV fluid resuscitation, pain medication, broad-spectrum antibiotics.

## 2022-06-27 NOTE — Assessment & Plan Note (Deleted)
Unable to discern from acute chest.  Has new infiltrate as well as respiratory symptoms including chest pain. Broad-spectrum antibiotics O2 to keep sats greater than 95% Aggressive incentive spirometry Sputum for culture Check urinary pathogens for Legionella and strep pneumo Albuterol as needed Can consider steroids if wheezing becomes more effective. Will admit to telemetry with continuous pulse ox. 

## 2022-06-27 NOTE — Progress Notes (Addendum)
Established Patient Office Visit  Subjective:  Patient ID: Dean Webb, male    DOB: 09/21/1975  Age: 47 y.o. MRN: [id number]  CC:  Chief Complaint  Patient presents with   Cough    Started proximally 5 days ago.   Fever   Shortness of Breath    HPI Dean Webb is a 47 y.o. male  has a past medical history of Asthma, GERD (gastroesophageal reflux disease), Hb-S/Hb-C disease (HCC) (1979), HTN (hypertension) (2008), Hypertension, Sickle cell anemia (HCC), and Sickle cell disease (HCC).   Patient presents with complaints of cough with greenish colored sputum, shortness of breath, pain on the left lateral side of the chest with breathing, fever that started gradually since the past 5 days.  States that this feels different from his sickle cell pain Patient denies sneezing, nausea, vomiting, abdominal pain, dizzy denies, trauma. no known sick contacts, smokes half pack of cigarettes daily.   Past Medical History:  Diagnosis Date   Asthma    GERD (gastroesophageal reflux disease)    Hb-S/Hb-C disease (HCC) 1979   HTN (hypertension) 2008   Hypertension    Sickle cell anemia (HCC)    Sickle cell disease (HCC)     Past Surgical History:  Procedure Laterality Date   CHOLECYSTECTOMY      Family History  Problem Relation Age of Onset   Sickle cell trait Mother    Hypertension Mother    Asthma Mother    Sickle cell trait Father    Congestive Heart Failure Father    Hypertension Sister    Sickle cell anemia Daughter    Hypertension Daughter    Diabetes Daughter     Social History   Socioeconomic History   Marital status: Single    Spouse name: Not on file   Number of children: Not on file   Years of education: Not on file   Highest education level: Not on file  Occupational History   Not on file  Tobacco Use   Smoking status: Every Day    Packs/day: 0.50    Years: 17.00    Additional pack years: 0.00    Total pack years: 8.50    Types: Cigarettes    Smokeless tobacco: Never  Vaping Use   Vaping Use: Never used  Substance and Sexual Activity   Alcohol use: Yes    Comment: Occasional drinker   Drug use: Not Currently    Types: Marijuana   Sexual activity: Yes  Other Topics Concern   Not on file  Social History Narrative   Not on file   Social Determinants of Health   Financial Resource Strain: Not on file  Food Insecurity: Not on file  Transportation Needs: Not on file  Physical Activity: Not on file  Stress: Not on file  Social Connections: Not on file  Intimate Partner Violence: Not on file    Outpatient Medications Prior to Visit  Medication Sig Dispense Refill   albuterol (VENTOLIN HFA) 108 (90 Base) MCG/ACT inhaler Inhale 2 puffs into the lungs every 6 (six) hours as needed for wheezing or shortness of breath. 8 g 1   amLODipine (NORVASC) 10 MG tablet Take 1 tablet (10 mg total) by mouth daily. 90 tablet 0   cetirizine (ZYRTEC) 10 MG tablet TAKE 1 TABLET BY MOUTH EVERY DAY AS NEEDED FOR ALLERGY 90 tablet 1   cloNIDine (CATAPRES) 0.1 MG tablet Take 1 tablet (0.1 mg total) by mouth 2 (two) times daily. 180 tablet 0  DULoxetine (CYMBALTA) 30 MG capsule Take 1 capsule (30 mg total) by mouth daily. 90 capsule 1   ibuprofen (ADVIL) 800 MG tablet Take 1 tablet (800 mg total) by mouth every 8 (eight) hours as needed. 30 tablet 0   losartan-hydrochlorothiazide (HYZAAR) 100-25 MG tablet Take 1 tablet by mouth daily. 90 tablet 0   metoprolol succinate (TOPROL-XL) 50 MG 24 hr tablet Take 1 tablet (50 mg total) by mouth daily. Take with or immediately following a meal. 90 tablet 3   montelukast (SINGULAIR) 10 MG tablet TAKE 1 TABLET BY MOUTH EVERYDAY AT BEDTIME 90 tablet 1   oxycodone (ROXICODONE) 30 MG immediate release tablet Take 1 tablet (30 mg total) by mouth every 6 (six) hours as needed for pain. 60 tablet 0   pantoprazole (PROTONIX) 40 MG tablet Take 1 tablet (40 mg total) by mouth daily. 30 tablet 3   tiZANidine (ZANAFLEX)  4 MG tablet Take 1 tablet (4 mg total) by mouth every 6 (six) hours as needed for muscle spasms. 30 tablet 0   Vitamin D, Ergocalciferol, (DRISDOL) 1.25 MG (50000 UNIT) CAPS capsule Take 1 capsule (50,000 Units total) by mouth every 7 (seven) days. 5 capsule 2   Blood Pressure Monitoring (OMRON 3 SERIES BP MONITOR) DEVI 1 kit by Does not apply route daily. (Patient not taking: Reported on 04/17/2022) 1 each 0   Blood Pressure Monitoring (OMRON 3 SERIES BP MONITOR) DEVI 1 each by Does not apply route in the morning and at bedtime. (Patient not taking: Reported on 10/12/2021) 1 each 0   morphine (AVINZA) 60 MG 24 hr capsule 1 cap(s) orally once a day (Patient not taking: Reported on 10/12/2021)     Facility-Administered Medications Prior to Visit  Medication Dose Route Frequency Provider Last Rate Last Admin   cloNIDine (CATAPRES) tablet 0.3 mg  0.3 mg Oral Once Barbette Merino, NP        No Known Allergies  ROS Review of Systems  Constitutional:  Positive for chills and fever. Negative for activity change, appetite change, diaphoresis, fatigue and unexpected weight change.  HENT:  Negative for congestion, dental problem, drooling and ear discharge.   Eyes:  Negative for pain, discharge, redness and itching.  Respiratory:  Positive for cough and shortness of breath. Negative for apnea, choking, chest tightness and wheezing.   Cardiovascular:  Positive for chest pain. Negative for palpitations and leg swelling.  Gastrointestinal:  Negative for abdominal distention, abdominal pain, anal bleeding, blood in stool, constipation, diarrhea and vomiting.  Endocrine: Negative for polydipsia, polyphagia and polyuria.  Genitourinary:  Negative for difficulty urinating, flank pain, frequency and genital sores.  Musculoskeletal: Negative.  Negative for arthralgias, back pain, gait problem and joint swelling.  Skin:  Negative for color change, pallor and rash.  Neurological:  Negative for dizziness, facial  asymmetry, light-headedness, numbness and headaches.  Psychiatric/Behavioral:  Negative for agitation, behavioral problems, confusion, hallucinations, self-injury, sleep disturbance and suicidal ideas.       Objective:    Physical Exam Vitals and nursing note reviewed.  Constitutional:      General: He is not in acute distress.    Appearance: Normal appearance. He is obese. He is ill-appearing. He is not toxic-appearing or diaphoretic.  HENT:     Nose: Rhinorrhea present.     Mouth/Throat:     Mouth: Mucous membranes are moist.     Pharynx: Oropharynx is clear. No oropharyngeal exudate or posterior oropharyngeal erythema.  Eyes:     General: No  scleral icterus.       Right eye: Discharge present.        Left eye: Discharge present.    Extraocular Movements: Extraocular movements intact.     Conjunctiva/sclera: Conjunctivae normal.  Cardiovascular:     Rate and Rhythm: Regular rhythm. Tachycardia present.     Pulses: Normal pulses.     Heart sounds: Normal heart sounds. No murmur heard.    No friction rub. No gallop.  Pulmonary:     Effort: Pulmonary effort is normal. No respiratory distress.     Breath sounds: Normal breath sounds. No stridor. No wheezing, rhonchi or rales.  Chest:     Chest wall: No tenderness.  Abdominal:     General: There is no distension.     Palpations: Abdomen is soft.     Tenderness: There is no abdominal tenderness. There is no right CVA tenderness, left CVA tenderness or guarding.  Musculoskeletal:        General: No swelling, tenderness, deformity or signs of injury.     Right lower leg: No edema.     Left lower leg: No edema.  Skin:    General: Skin is warm and dry.     Capillary Refill: Capillary refill takes less than 2 seconds.     Coloration: Skin is not jaundiced or pale.     Findings: No bruising, erythema or lesion.  Neurological:     Mental Status: He is alert and oriented to person, place, and time.     Motor: No weakness.      Coordination: Coordination normal.     Gait: Gait normal.  Psychiatric:        Mood and Affect: Mood normal.        Behavior: Behavior normal.        Thought Content: Thought content normal.        Judgment: Judgment normal.     BP 135/84   Pulse (!) 106   Temp (!) 97 F (36.1 C)   Ht 5\' 7"  (1.702 m)   Wt 208 lb 3.2 oz (94.4 kg)   SpO2 99%   BMI 32.61 kg/m  Wt Readings from Last 3 Encounters:  06/27/22 208 lb 3.2 oz (94.4 kg)  04/17/22 208 lb 3.2 oz (94.4 kg)  01/12/22 202 lb 3.2 oz (91.7 kg)    No results found for: "TSH" Lab Results  Component Value Date   WBC 9.8 04/17/2022   HGB 13.6 04/17/2022   HCT 39.8 04/17/2022   MCV 85 04/17/2022   PLT 375 04/17/2022   Lab Results  Component Value Date   NA 136 04/17/2022   K 3.8 04/17/2022   CO2 23 04/17/2022   GLUCOSE 93 04/17/2022   BUN 9 04/17/2022   CREATININE 1.30 (H) 04/17/2022   BILITOT 0.9 04/17/2022   ALKPHOS 94 04/17/2022   AST 23 04/17/2022   ALT 21 04/17/2022   PROT 7.1 04/17/2022   ALBUMIN 4.6 04/17/2022   CALCIUM 10.7 (H) 04/17/2022   ANIONGAP 10 12/01/2019   EGFR 69 04/17/2022   Lab Results  Component Value Date   CHOL  11/06/2008    84        ATP III CLASSIFICATION:  <200     mg/dL   Desirable  213-086  mg/dL   Borderline High  >=578    mg/dL   High          Lab Results  Component Value Date   HDL 31 (L) 11/06/2008  Lab Results  Component Value Date   [facility]  11/06/2008    44        Total Cholesterol/HDL:CHD Risk Coronary Heart Disease Risk Table                     Men   Women  1/2 Average Risk   3.4   3.3  Average Risk       5.0   4.4  2 X Average Risk   9.6   7.1  3 X Average Risk  23.4   11.0        Use the calculated Patient Ratio above and the CHD Risk Table to determine the patient's CHD Risk.        ATP III CLASSIFICATION (LDL):  <100     mg/dL   Optimal  295-621  mg/dL   Near or Above                    Optimal  130-159  mg/dL   Borderline  308-657  mg/dL    High  >846     mg/dL   Very High   Lab Results  Component Value Date   TRIG 44 11/06/2008   Lab Results  Component Value Date   CHOLHDL 2.7 11/06/2008   No results found for: "HGBA1C"    Assessment & Plan:   Problem List Items Addressed This Visit       Other   Acute cough    Negative for flu and COVID      Relevant Orders   POCT Influenza A/B (Completed)   SOB (shortness of breath) - Primary    Negative for COVID and flu Due to complaints of shortness of breath with chest pain patient encouraged to go to the emergency department for further evaluation to rule out PE, pneumothorax.  Smoking cessation encouraged      Relevant Orders   POC COVID-19 BinaxNow (Completed)    No orders of the defined types were placed in this encounter.   Follow-up: No follow-ups on file.    Donell Beers, FNP

## 2022-06-27 NOTE — Assessment & Plan Note (Signed)
Continue PPI ?

## 2022-06-27 NOTE — ED Triage Notes (Signed)
Pt arrived via POV. Pt c/o productive cough and L sided chest pain for 4x days.   Pt's PCP referred them here for r/o of PE/pneumothorax.  Neg for COVID at PCP  AOx4

## 2022-06-27 NOTE — Assessment & Plan Note (Addendum)
Community-acquired versus acute chest syndrome.  Very difficult to tease this out in the patient with [place] disease and history of acute chest in the past. D5 half-normal saline and continued hydration. Aggressive incentive spirometry Continuous pulse oximetry Broad-spectrum antibiotics with ceftriaxone and azithromycin. Sputum for culture and Gram stain Urinary pathogens for Legionella and strep pneumo. Oxygen therapy to keep oxygen greater than 95th percentile Consideration of CT angiogram if patient decompensates to rule out PE with lung infarction Low threshold for transfer to ICU if patient decompensates.

## 2022-06-27 NOTE — Assessment & Plan Note (Signed)
Continue Claritin, Singulair 

## 2022-06-27 NOTE — Assessment & Plan Note (Signed)
Ongoing for the last several years.  Could be related to thiazide diuretic use or vitamin D repletion. Will check PTH and ionized calcium to start

## 2022-06-27 NOTE — Assessment & Plan Note (Signed)
[place] disease with chronic pain and opiate use. Have added Dilaudid as I am unclear he needs a full PCA at this point however would quickly convert to PCA if pain becomes an ongoing issue.

## 2022-06-27 NOTE — Assessment & Plan Note (Signed)
No wheezing noted now Continue albuterol as needed Low threshold for inhaled versus systemic steroids if needed

## 2022-06-27 NOTE — Assessment & Plan Note (Signed)
Negative for flu and COVID

## 2022-06-27 NOTE — ED Notes (Signed)
Pt back from ct

## 2022-06-27 NOTE — Assessment & Plan Note (Signed)
Continue Claritin, Singulair

## 2022-06-27 NOTE — Assessment & Plan Note (Signed)
Unable to discern from acute chest.  Has new infiltrate as well as respiratory symptoms including chest pain. Broad-spectrum antibiotics O2 to keep sats greater than 95% Aggressive incentive spirometry Sputum for culture Check urinary pathogens for Legionella and strep pneumo Albuterol as needed Can consider steroids if wheezing becomes more effective. Will admit to telemetry with continuous pulse ox.

## 2022-06-27 NOTE — Assessment & Plan Note (Signed)
Patient reportedly smokes 1/2 pack/day. On 14 mg patch, consider increasing to 21 if needed.

## 2022-06-27 NOTE — H&P (Signed)
History and Physical    Patient: Dean Webb NWG:[id number] DOB: 1975-12-01 DOA: 06/27/2022 DOS: the patient was seen and examined on 06/27/2022 PCP: Ivonne Andrew, NP  Patient coming from: Home  Chief Complaint:  Chief Complaint  Patient presents with   Cough   Chest Pain   HPI: Dean Webb is a 47 y.o. male with medical history significant of sickle C disease, HTN, GERD who presents with several day history of cough and left-sided chest pain.  He has ongoing tobacco use and smokes approximately 1/2 pack/day.  He reports subjective fevers at home and is coughing up clear sputum.  He denies hemoptysis.  He denies sick contacts.  In the ED he is noted to have left lower lobe consolidation consistent with pneumonia versus acute chest.  His lactic acid is noted to be normal.  He has an elevated WBC at 14.5.  Patient has a normal lactic acid and troponins. Review of Systems: As mentioned in the history of present illness. All other systems reviewed and are negative. Past Medical History:  Diagnosis Date   Asthma    GERD (gastroesophageal reflux disease)    Hb-S/Hb-C disease (HCC) 1979   HTN (hypertension) 2008   Hypertension    Sickle cell anemia (HCC)    Sickle cell disease (HCC)    Past Surgical History:  Procedure Laterality Date   CHOLECYSTECTOMY     Social History:  reports that he has been smoking cigarettes. He has a 8.50 pack-year smoking history. He has never used smokeless tobacco. He reports current alcohol use. He reports that he does not currently use drugs after having used the following drugs: Marijuana.  No Known Allergies  Family History  Problem Relation Age of Onset   Sickle cell trait Mother    Hypertension Mother    Asthma Mother    Sickle cell trait Father    Congestive Heart Failure Father    Hypertension Sister    Sickle cell anemia Daughter    Hypertension Daughter    Diabetes Daughter     Prior to Admission medications   Medication Sig  Start Date End Date Taking? Authorizing Provider  albuterol (VENTOLIN HFA) 108 (90 Base) MCG/ACT inhaler Inhale 2 puffs into the lungs every 6 (six) hours as needed for wheezing or shortness of breath. 01/12/22 06/27/22  Ivonne Andrew, NP  amLODipine (NORVASC) 10 MG tablet Take 1 tablet (10 mg total) by mouth daily. 04/17/22   Ivonne Andrew, NP  Blood Pressure Monitoring (OMRON 3 SERIES BP MONITOR) DEVI 1 kit by Does not apply route daily. Patient not taking: Reported on 04/17/2022 06/15/21   Orion Crook I, NP  Blood Pressure Monitoring (OMRON 3 SERIES BP MONITOR) DEVI 1 each by Does not apply route in the morning and at bedtime. Patient not taking: Reported on 10/12/2021 07/18/21   Orion Crook I, NP  cetirizine (ZYRTEC) 10 MG tablet TAKE 1 TABLET BY MOUTH EVERY DAY AS NEEDED FOR ALLERGY 04/17/22   Ivonne Andrew, NP  cloNIDine (CATAPRES) 0.1 MG tablet Take 1 tablet (0.1 mg total) by mouth 2 (two) times daily. 04/07/22 07/06/22  Ivonne Andrew, NP  DULoxetine (CYMBALTA) 30 MG capsule Take 1 capsule (30 mg total) by mouth daily. 04/17/22   Ivonne Andrew, NP  ibuprofen (ADVIL) 800 MG tablet Take 1 tablet (800 mg total) by mouth every 8 (eight) hours as needed. 04/07/22   Ivonne Andrew, NP  losartan-hydrochlorothiazide (HYZAAR) 100-25 MG tablet Take 1  tablet by mouth daily. 04/17/22   Ivonne Andrew, NP  metoprolol succinate (TOPROL-XL) 50 MG 24 hr tablet Take 1 tablet (50 mg total) by mouth daily. Take with or immediately following a meal. 04/17/22   Ivonne Andrew, NP  montelukast (SINGULAIR) 10 MG tablet TAKE 1 TABLET BY MOUTH EVERYDAY AT BEDTIME 04/17/22   Ivonne Andrew, NP  morphine (AVINZA) 60 MG 24 hr capsule 1 cap(s) orally once a day Patient not taking: Reported on 10/12/2021    [provider]  oxycodone (ROXICODONE) 30 MG immediate release tablet Take 1 tablet (30 mg total) by mouth every 6 (six) hours as needed for pain. 06/19/22   Ivonne Andrew, NP  pantoprazole  (PROTONIX) 40 MG tablet Take 1 tablet (40 mg total) by mouth daily. 04/17/22   Ivonne Andrew, NP  tiZANidine (ZANAFLEX) 4 MG tablet Take 1 tablet (4 mg total) by mouth every 6 (six) hours as needed for muscle spasms. 06/19/22   Ivonne Andrew, NP  Vitamin D, Ergocalciferol, (DRISDOL) 1.25 MG (50000 UNIT) CAPS capsule Take 1 capsule (50,000 Units total) by mouth every 7 (seven) days. 01/13/22   Ivonne Andrew, NP    Physical Exam: Vitals:   06/27/22 1645 06/27/22 1745 06/27/22 1800 06/27/22 1845  BP: (!) 148/97 (!) 138/102 (!) 148/102 (!) 134/96  Pulse: (!) 102 93 93 88  Resp: (!) 24 17 (!) 21 15  Temp:      TempSrc:      SpO2: 98% 96% 97% 97%   Physical Examination: General appearance - alert, well appearing, and in no distress Neck - supple, no significant adenopathy Chest -good air movement, no wheezing heard, Rales in the left lower lobe noted Heart - normal rate, regular rhythm, normal S1, S2, no murmurs, rubs, clicks or gallops Abdomen - soft, nontender, nondistended, no masses or organomegaly Extremities - peripheral pulses normal, no pedal edema, no clubbing or cyanosis  Data Reviewed: Results for orders placed or performed during the hospital encounter of 06/27/22 (from the past 24 hour(s))  Basic metabolic panel     Status: Abnormal   Collection Time: 06/27/22  4:24 PM  Result Value Ref Range   Sodium 135 135 - 145 mmol/L   Potassium 3.6 3.5 - 5.1 mmol/L   Chloride 101 98 - 111 mmol/L   CO2 26 22 - 32 mmol/L   Glucose, Bld 116 (H) 70 - 99 mg/dL   BUN 9 6 - 20 mg/dL   Creatinine, Ser 1.61 0.61 - 1.24 mg/dL   Calcium 09.6 (H) 8.9 - 10.3 mg/dL   GFR, Estimated >04 >54 mL/min   Anion gap 8 5 - 15  CBC     Status: Abnormal   Collection Time: 06/27/22  4:24 PM  Result Value Ref Range   WBC 14.5 (H) 4.0 - 10.5 K/uL   RBC 4.23 4.22 - 5.81 MIL/uL   Hemoglobin 12.1 (L) 13.0 - 17.0 g/dL   HCT 09.8 (L) 11.9 - 14.7 %   MCV 76.6 (L) 80.0 - 100.0 fL   MCH 28.6 26.0 - 34.0 pg    MCHC 37.3 (H) 30.0 - 36.0 g/dL   RDW 82.9 (H) 56.2 - 13.0 %   Platelets 343 150 - 400 K/uL   nRBC 0.2 0.0 - 0.2 %  Troponin I (High Sensitivity)     Status: None   Collection Time: 06/27/22  4:24 PM  Result Value Ref Range   Troponin I (High Sensitivity) 7 <18 ng/L  Lactic acid, plasma     Status: None   Collection Time: 06/27/22  4:29 PM  Result Value Ref Range   Lactic Acid, Venous 1.5 0.5 - 1.9 mmol/L  Resp panel by RT-PCR (RSV, Flu A&B, Covid) Anterior Nasal Swab     Status: None   Collection Time: 06/27/22  4:32 PM   Specimen: Anterior Nasal Swab  Result Value Ref Range   SARS Coronavirus 2 by RT PCR NEGATIVE NEGATIVE   Influenza A by PCR NEGATIVE NEGATIVE   Influenza B by PCR NEGATIVE NEGATIVE   Resp Syncytial Virus by PCR NEGATIVE NEGATIVE   DG Chest 2 View  Result Date: 06/27/2022 CLINICAL DATA:  Chest pain EXAM: CHEST - 2 VIEW COMPARISON:  X-ray 11/26/2019 and older FINDINGS: Focal consolidative opacity at the left lung base, involving the lingula in particular. Lower lobe involvement is also possible. Recommend follow-up to confirm clearance. No pneumothorax, edema or effusion. Normal cardiopericardial silhouette. Overlapping cardiac leads. Healed right clavicle fracture, remote. IMPRESSION: Consolidative opacity in the left lung base, lingula and possibly left lower lobe. Recommend follow-up to confirm clearance. No significant effusion Electronically Signed   By: Karen Kays M.D.   On: 06/27/2022 17:39     Assessment and Plan: * Pneumonia Community-acquired versus acute chest syndrome.  Very difficult to tease this out in the patient with [place]field disease and history of acute chest in the past. D5 half-normal saline and continued hydration. Aggressive incentive spirometry Continuous pulse oximetry Broad-spectrum antibiotics with ceftriaxone and azithromycin. Sputum for culture and Gram stain Urinary pathogens for Legionella and strep pneumo. Oxygen therapy to keep oxygen  greater than 95th percentile Consideration of CT angiogram if patient decompensates to rule out PE with lung infarction Low threshold for transfer to ICU if patient decompensates.  Hypercalcemia Ongoing for the last several years.  Could be related to thiazide diuretic use or vitamin D repletion. Will check PTH and ionized calcium to start  GERD (gastroesophageal reflux disease) Continue PPI  Seasonal allergies Continue Claritin, Singulair  Tobacco dependence Patient reportedly smokes 1/2 pack/day. On 14 mg patch, consider increasing to 21 if needed.  Asthma without status asthmaticus No wheezing noted now Continue albuterol as needed Low threshold for inhaled versus systemic steroids if needed  Sickle cell-hemoglobin C disease without crisis (HCC) Stable hemoglobin On Oxy 30s daily as needed IV Dilaudid as needed, consider change to PCA if needed  Essential hypertension Continue amlodipine, clonidine, Hyzaar, metoprolol    Advance Care Planning:   Code Status: Prior full  Consults: none  Family Communication: Patient at bedside  Severity of Illness: The appropriate patient status for this patient is OBSERVATION. Observation status is judged to be reasonable and necessary in order to provide the required intensity of service to ensure the patient's safety. The patient's presenting symptoms, physical exam findings, and initial radiographic and laboratory data in the context of their medical condition is felt to place them at decreased risk for further clinical deterioration. Furthermore, it is anticipated that the patient will be medically stable for discharge from the hospital within 2 midnights of admission.   Author: Reva Bores, MD 06/27/2022 7:09 PM  For on call review [email or web address].

## 2022-06-27 NOTE — Patient Instructions (Signed)

## 2022-06-27 NOTE — Assessment & Plan Note (Signed)
Continue amlodipine, clonidine, Hyzaar, metoprolol

## 2022-06-27 NOTE — ED Notes (Signed)
ED TO INPATIENT HANDOFF REPORT  ED Nurse Name and Phone #: Thamas Jaegers Name/Age/Gender Granville Lewis Wichman 47 y.o. male Room/Bed: WA25/WA25  Code Status   Code Status: Prior  Home/SNF/Other Home Patient oriented to: self, place, time, and situation Is this baseline? Yes   Triage Complete: Triage complete  Chief Complaint Pneumonia [J18.9]  Triage Note Pt arrived via POV. Pt c/o productive cough and L sided chest pain for 4x days.   Pt's PCP referred them here for r/o of PE/pneumothorax.  Neg for COVID at PCP  AOx4   Allergies No Known Allergies  Level of Care/Admitting Diagnosis ED Disposition     ED Disposition  Admit   Condition  --   Comment  Hospital Area: [facility] [[id number]]  Level of Care: Telemetry [5]  Admit to tele based on following criteria: Other see comments  Comments: ? ACS  May admit patient to [facility] or [facility] if equivalent level of care is available:: No  Covid Evaluation: Confirmed COVID Negative  Diagnosis: Pneumonia [227785]  Admitting Physician: Samara Snide  Attending Physician: Reva Bores [[id number]]  Certification:: I certify this patient will need inpatient services for at least 2 midnights  Estimated Length of Stay: 3          B Medical/Surgery History Past Medical History:  Diagnosis Date   Asthma    GERD (gastroesophageal reflux disease)    Hb-S/Hb-C disease (HCC) 1979   HTN (hypertension) 2008   Hypertension    Sickle cell anemia (HCC)    Sickle cell disease (HCC)    Past Surgical History:  Procedure Laterality Date   CHOLECYSTECTOMY       A IV Location/Drains/Wounds Patient Lines/Drains/Airways Status     Active Line/Drains/Airways     Name Placement date Placement time Site Days   Peripheral IV 06/27/22 20 G Right Antecubital 06/27/22  1645  Antecubital  less than 1            Intake/Output Last 24 hours No intake or output data in the 24 hours ending 06/27/22  1847  Labs/Imaging Results for orders placed or performed during the hospital encounter of 06/27/22 (from the past 48 hour(s))  Basic metabolic panel     Status: Abnormal   Collection Time: 06/27/22  4:24 PM  Result Value Ref Range   Sodium 135 135 - 145 mmol/L   Potassium 3.6 3.5 - 5.1 mmol/L   Chloride 101 98 - 111 mmol/L   CO2 26 22 - 32 mmol/L   Glucose, Bld 116 (H) 70 - 99 mg/dL    Comment: Glucose reference range applies only to samples taken after fasting for at least 8 hours.   BUN 9 6 - 20 mg/dL   Creatinine, Ser 1.61 0.61 - 1.24 mg/dL   Calcium 09.6 (H) 8.9 - 10.3 mg/dL   GFR, Estimated >04 >54 mL/min    Comment: (NOTE) Calculated using the CKD-EPI Creatinine Equation (2021)    Anion gap 8 5 - 15    Comment: Performed at [facility], [address], [place], [place] [address]  CBC     Status: Abnormal   Collection Time: 06/27/22  4:24 PM  Result Value Ref Range   WBC 14.5 (H) 4.0 - 10.5 K/uL   RBC 4.23 4.22 - 5.81 MIL/uL   Hemoglobin 12.1 (L) 13.0 - 17.0 g/dL   HCT 91.4 (L) 78.2 - 95.6 %   MCV 76.6 (L) 80.0 - 100.0  fL   MCH 28.6 26.0 - 34.0 pg   MCHC 37.3 (H) 30.0 - 36.0 g/dL   RDW 16.1 (H) 09.6 - 04.5 %   Platelets 343 150 - 400 K/uL   nRBC 0.2 0.0 - 0.2 %    Comment: Performed at [facility], [address] [place], [place] [address]  Troponin I (High Sensitivity)     Status: None   Collection Time: 06/27/22  4:24 PM  Result Value Ref Range   Troponin I (High Sensitivity) 7 <18 ng/L    Comment: (NOTE) Elevated high sensitivity troponin I (hsTnI) values and significant  changes across serial measurements may suggest ACS but many other  chronic and acute conditions are known to elevate hsTnI results.  Refer to the "Links" section for chest pain algorithms and additional  guidance. Performed at [facility], [address] [place], [place] [address]   Lactic acid, plasma     Status: None    Collection Time: 06/27/22  4:29 PM  Result Value Ref Range   Lactic Acid, Venous 1.5 0.5 - 1.9 mmol/L    Comment: Performed at [facility], [address], [place], [place] [address]  Resp panel by RT-PCR (RSV, Flu A&B, Covid) Anterior Nasal Swab     Status: None   Collection Time: 06/27/22  4:32 PM   Specimen: Anterior Nasal Swab  Result Value Ref Range   SARS Coronavirus 2 by RT PCR NEGATIVE NEGATIVE    Comment: (NOTE) SARS-CoV-2 target nucleic acids are NOT DETECTED.  The SARS-CoV-2 RNA is generally detectable in upper respiratory specimens during the acute phase of infection. The lowest concentration of SARS-CoV-2 viral copies this assay can detect is 138 copies/mL. A negative result does not preclude SARS-Cov-2 infection and should not be used as the sole basis for treatment or other patient management decisions. A negative result may occur with  improper specimen collection/handling, submission of specimen other than nasopharyngeal swab, presence of viral mutation(s) within the areas targeted by this assay, and inadequate number of viral copies(<138 copies/mL). A negative result must be combined with clinical observations, patient history, and epidemiological information. The expected result is Negative.  Fact Sheet for Patients:  BloggerCourse.com  Fact Sheet for Healthcare Providers:  SeriousBroker.it  This test is no t yet approved or cleared by the [place] FDA and  has been authorized for detection and/or diagnosis of SARS-CoV-2 by FDA under an Emergency Use Authorization (EUA). This EUA will remain  in effect (meaning this test can be used) for the duration of the COVID-19 declaration under Section 564(b)(1) of the Act, 21 U.S.C.section 360bbb-3(b)(1), unless the authorization is terminated  or revoked sooner.       Influenza A by PCR NEGATIVE NEGATIVE   Influenza B by PCR NEGATIVE NEGATIVE     Comment: (NOTE) The Xpert Xpress SARS-CoV-2/FLU/RSV plus assay is intended as an aid in the diagnosis of influenza from Nasopharyngeal swab specimens and should not be used as a sole basis for treatment. Nasal washings and aspirates are unacceptable for Xpert Xpress SARS-CoV-2/FLU/RSV testing.  Fact Sheet for Patients: BloggerCourse.com  Fact Sheet for Healthcare Providers: SeriousBroker.it  This test is not yet approved or cleared by the [place] FDA and has been authorized for detection and/or diagnosis of SARS-CoV-2 by FDA under an Emergency Use Authorization (EUA). This EUA will remain in effect (meaning this test can be used) for the duration of the COVID-19 declaration under Section 564(b)(1) of the  Act, 21 U.S.C. section 360bbb-3(b)(1), unless the authorization is terminated or revoked.     Resp Syncytial Virus by PCR NEGATIVE NEGATIVE    Comment: (NOTE) Fact Sheet for Patients: BloggerCourse.com  Fact Sheet for Healthcare Providers: SeriousBroker.it  This test is not yet approved or cleared by the [place] FDA and has been authorized for detection and/or diagnosis of SARS-CoV-2 by FDA under an Emergency Use Authorization (EUA). This EUA will remain in effect (meaning this test can be used) for the duration of the COVID-19 declaration under Section 564(b)(1) of the Act, 21 U.S.C. section 360bbb-3(b)(1), unless the authorization is terminated or revoked.  Performed at [facility], [address] [place], [place] [address]    DG Chest 2 View  Result Date: 06/27/2022 CLINICAL DATA:  Chest pain EXAM: CHEST - 2 VIEW COMPARISON:  X-ray 11/26/2019 and older FINDINGS: Focal consolidative opacity at the left lung base, involving the lingula in particular. Lower lobe involvement is also possible. Recommend follow-up to confirm clearance. No  pneumothorax, edema or effusion. Normal cardiopericardial silhouette. Overlapping cardiac leads. Healed right clavicle fracture, remote. IMPRESSION: Consolidative opacity in the left lung base, lingula and possibly left lower lobe. Recommend follow-up to confirm clearance. No significant effusion Electronically Signed   By: Karen Kays M.D.   On: 06/27/2022 17:39    Pending Labs Unresulted Labs (From admission, onward)     Start     Ordered   06/27/22 1618  Blood culture (routine x 2)  BLOOD CULTURE X 2,   R      06/27/22 1617            Vitals/Pain Today's Vitals   06/27/22 1727 06/27/22 1745 06/27/22 1800 06/27/22 1845  BP:  (!) 138/102 (!) 148/102 (!) 134/96  Pulse:  93 93 88  Resp:  17 (!) 21 15  Temp:      TempSrc:      SpO2:  96% 97% 97%  PainSc: 6        Isolation Precautions No active isolations  Medications Medications  lactated ringers infusion ( Intravenous New Bag/Given 06/27/22 1650)  cefTRIAXone (ROCEPHIN) 1 g in sodium chloride 0.9 % 100 mL IVPB (1 g Intravenous New Bag/Given 06/27/22 1845)  azithromycin (ZITHROMAX) 500 mg in sodium chloride 0.9 % 250 mL IVPB (has no administration in time range)  HYDROmorphone (DILAUDID) injection 1 mg (1 mg Intravenous Given 06/27/22 1650)  ondansetron (ZOFRAN) injection 4 mg (4 mg Intravenous Given 06/27/22 1650)  acetaminophen (TYLENOL) tablet 1,000 mg (1,000 mg Oral Given 06/27/22 1650)    Mobility walks     Focused Assessments    R Recommendations: See Admitting Provider Note  Report given to:   Additional Notes:

## 2022-06-27 NOTE — ED Provider Notes (Signed)
[place] EMERGENCY DEPARTMENT AT [facility] Provider Note   CSN: [id number] Arrival date & time: 06/27/22  1546     History  Chief Complaint  Patient presents with   Cough   Chest Pain    FITZGERALD HATTEN is a 47 y.o. male.  Patient is a 47 year old male with a history of sickle cell disease, hypertension, asthma, GERD who is presenting today with complaint of cough, chest pain and low-grade temperature.  Patient states symptoms started 2 days ago.  They all started about the same time.  He has had persistent cough with a little bit of sputum, felt feverish at home and had pain in the left side of his chest and back.  It is more painful when he coughs and takes a deep breath to where now he even feels that he is bending over to walk because its painful to straighten up.  He has intermittently felt short of breath and has used his inhaler which has been minimally helpful.  He has had some diarrhea but denies any nausea or vomiting.  No abdominal pain.  He has oxycodone at home to take for pain for his sickle cell crises but has not taken any because he felt that this pain was different.  He went to see his doctor today who recommended he come here for further care.  He has received all recommended vaccinations.  He states he was checked for COVID in the office and it was negative.  Last blood transfusion was a few years ago.  Patient also reports he is taken all of his normal medications this morning.  The history is provided by the patient and medical records.  Cough Associated symptoms: chest pain   Chest Pain Associated symptoms: cough        Home Medications Prior to Admission medications   Medication Sig Start Date End Date Taking? Authorizing Provider  albuterol (VENTOLIN HFA) 108 (90 Base) MCG/ACT inhaler Inhale 2 puffs into the lungs every 6 (six) hours as needed for wheezing or shortness of breath. 01/12/22 06/27/22  Ivonne Andrew, NP  amLODipine (NORVASC) 10  MG tablet Take 1 tablet (10 mg total) by mouth daily. 04/17/22   Ivonne Andrew, NP  Blood Pressure Monitoring (OMRON 3 SERIES BP MONITOR) DEVI 1 kit by Does not apply route daily. Patient not taking: Reported on 04/17/2022 06/15/21   Orion Crook I, NP  Blood Pressure Monitoring (OMRON 3 SERIES BP MONITOR) DEVI 1 each by Does not apply route in the morning and at bedtime. Patient not taking: Reported on 10/12/2021 07/18/21   Orion Crook I, NP  cetirizine (ZYRTEC) 10 MG tablet TAKE 1 TABLET BY MOUTH EVERY DAY AS NEEDED FOR ALLERGY 04/17/22   Ivonne Andrew, NP  cloNIDine (CATAPRES) 0.1 MG tablet Take 1 tablet (0.1 mg total) by mouth 2 (two) times daily. 04/07/22 07/06/22  Ivonne Andrew, NP  DULoxetine (CYMBALTA) 30 MG capsule Take 1 capsule (30 mg total) by mouth daily. 04/17/22   Ivonne Andrew, NP  ibuprofen (ADVIL) 800 MG tablet Take 1 tablet (800 mg total) by mouth every 8 (eight) hours as needed. 04/07/22   Ivonne Andrew, NP  losartan-hydrochlorothiazide (HYZAAR) 100-25 MG tablet Take 1 tablet by mouth daily. 04/17/22   Ivonne Andrew, NP  metoprolol succinate (TOPROL-XL) 50 MG 24 hr tablet Take 1 tablet (50 mg total) by mouth daily. Take with or immediately following a meal. 04/17/22   Ivonne Andrew, NP  montelukast (SINGULAIR) 10 MG tablet TAKE 1 TABLET BY MOUTH EVERYDAY AT BEDTIME 04/17/22   Ivonne Andrew, NP  morphine (AVINZA) 60 MG 24 hr capsule 1 cap(s) orally once a day Patient not taking: Reported on 10/12/2021    [provider]  oxycodone (ROXICODONE) 30 MG immediate release tablet Take 1 tablet (30 mg total) by mouth every 6 (six) hours as needed for pain. 06/19/22   Ivonne Andrew, NP  pantoprazole (PROTONIX) 40 MG tablet Take 1 tablet (40 mg total) by mouth daily. 04/17/22   Ivonne Andrew, NP  tiZANidine (ZANAFLEX) 4 MG tablet Take 1 tablet (4 mg total) by mouth every 6 (six) hours as needed for muscle spasms. 06/19/22   Ivonne Andrew, NP  Vitamin D,  Ergocalciferol, (DRISDOL) 1.25 MG (50000 UNIT) CAPS capsule Take 1 capsule (50,000 Units total) by mouth every 7 (seven) days. 01/13/22   Ivonne Andrew, NP      Allergies    Patient has no known allergies.    Review of Systems   Review of Systems  Respiratory:  Positive for cough.   Cardiovascular:  Positive for chest pain.    Physical Exam Updated Vital Signs BP (!) 138/102   Pulse 93   Temp 99.3 F (37.4 C) (Oral)   Resp 17   SpO2 96%  Physical Exam Vitals and nursing note reviewed.  Constitutional:      General: He is not in acute distress.    Appearance: He is well-developed.  HENT:     Head: Normocephalic and atraumatic.  Eyes:     Conjunctiva/sclera: Conjunctivae normal.     Pupils: Pupils are equal, round, and reactive to light.  Cardiovascular:     Rate and Rhythm: Regular rhythm. Tachycardia present.     Pulses: Normal pulses.     Heart sounds: No murmur heard. Pulmonary:     Effort: Pulmonary effort is normal. No respiratory distress.     Breath sounds: Examination of the left-lower field reveals rhonchi. Rhonchi present. No wheezing or rales.  Chest:     Chest wall: Tenderness present.  Abdominal:     General: There is no distension.     Palpations: Abdomen is soft.     Tenderness: There is no abdominal tenderness. There is no guarding or rebound.  Musculoskeletal:        General: No tenderness. Normal range of motion.     Cervical back: Normal range of motion and neck supple.     Right lower leg: No edema.     Left lower leg: No edema.  Skin:    General: Skin is warm and dry.     Findings: No erythema or rash.  Neurological:     Mental Status: He is alert and oriented to person, place, and time.  Psychiatric:        Behavior: Behavior normal.     ED Results / Procedures / Treatments   Labs (all labs ordered are listed, but only abnormal results are displayed) Labs Reviewed  BASIC METABOLIC PANEL - Abnormal; Notable for the following  components:      Result Value   Glucose, Bld 116 (*)    Calcium 10.5 (*)    All other components within normal limits  CBC - Abnormal; Notable for the following components:   WBC 14.5 (*)    Hemoglobin 12.1 (*)    HCT 32.4 (*)    MCV 76.6 (*)    MCHC 37.3 (*)  RDW 16.0 (*)    All other components within normal limits  RESP PANEL BY RT-PCR (RSV, FLU A&B, COVID)  [id number]  CULTURE, BLOOD (ROUTINE X 2)  CULTURE, BLOOD (ROUTINE X 2)  LACTIC ACID, PLASMA  TROPONIN I (HIGH SENSITIVITY)  TROPONIN I (HIGH SENSITIVITY)    EKG EKG Interpretation  Date/Time:  Tuesday Jun 27 2022 15:59:00 EDT Ventricular Rate:  113 PR Interval:  129 QRS Duration: 78 QT Interval:  320 QTC Calculation: 439 R Axis:   31 Text Interpretation: Sinus tachycardia Probable left atrial enlargement Borderline T abnormalities, lateral leads Since last tracing rate faster Otherwise no significant change Confirmed by Melene Plan [id number]) on 06/27/2022 4:01:42 PM  Radiology DG Chest 2 View  Result Date: 06/27/2022 CLINICAL DATA:  Chest pain EXAM: CHEST - 2 VIEW COMPARISON:  X-ray 11/26/2019 and older FINDINGS: Focal consolidative opacity at the left lung base, involving the lingula in particular. Lower lobe involvement is also possible. Recommend follow-up to confirm clearance. No pneumothorax, edema or effusion. Normal cardiopericardial silhouette. Overlapping cardiac leads. Healed right clavicle fracture, remote. IMPRESSION: Consolidative opacity in the left lung base, lingula and possibly left lower lobe. Recommend follow-up to confirm clearance. No significant effusion Electronically Signed   By: Karen Kays M.D.   On: 06/27/2022 17:39    Procedures Procedures    Medications Ordered in ED Medications  lactated ringers infusion ( Intravenous New Bag/Given 06/27/22 1650)  cefTRIAXone (ROCEPHIN) 1 g in sodium chloride 0.9 % 100 mL IVPB (has no administration in time range)  azithromycin (ZITHROMAX) 500 mg in sodium  chloride 0.9 % 250 mL IVPB (has no administration in time range)  HYDROmorphone (DILAUDID) injection 1 mg (1 mg Intravenous Given 06/27/22 1650)  ondansetron (ZOFRAN) injection 4 mg (4 mg Intravenous Given 06/27/22 1650)  acetaminophen (TYLENOL) tablet 1,000 mg (1,000 mg Oral Given 06/27/22 1650)    ED Course/ Medical Decision Making/ A&P                             Medical Decision Making Amount and/or Complexity of Data Reviewed External Data Reviewed: notes. Labs: ordered. Decision-making details documented in ED Course. Radiology: ordered and independent interpretation performed. Decision-making details documented in ED Course.  Risk OTC drugs. Prescription drug management. Parenteral controlled substances. Decision regarding hospitalization.   Pt with multiple medical problems and comorbidities and presenting today with a complaint that caries a high risk for morbidity and mortality.  Here today with symptoms of upper respiratory illness viral versus infectious.  Possibility for acute chest, lower suspicion for PE or ACS.  Low suspicion for acute abdominal process.  Also concern for exacerbation of sickle cell disease.  Patient is tachycardic here and temperature of 99.3.  Sats are stable in the high 90s.  He is requesting something for pain and was started on maintenance fluids.  Sepsis labs and x-ray pending  6:02 PM I independently interpreted patient's labs.  CBC with leukocytosis of 14, hemoglobin stable at 12, viral panel is negative, lactic acid within normal limits, BMP with calcium of 10.5 but otherwise no acute findings, troponin within normal limits.  I have independently visualized and interpreted pt's images today.  Chest x-ray with findings of left lower lobe pneumonia.  Patient given Rocephin and azithromycin.  Given high risk as he is a sickle cell patient will admit for further care.  Findings discussed with the patient and his family.  He is comfortable with this plan.  Consulted hospitalist for admission.          Final Clinical Impression(s) / ED Diagnoses Final diagnoses:  Sickle cell disease without crisis (HCC)  Community acquired pneumonia of left lower lobe of lung    Rx / DC Orders ED Discharge Orders     None         Gwyneth Sprout, MD 06/27/22 1802

## 2022-06-27 NOTE — Assessment & Plan Note (Signed)
Reports about 1/2 pack/day Interested in quitting Nicotine patch 14 mg, increase to 21 if needed

## 2022-06-27 NOTE — Assessment & Plan Note (Signed)
Stable hemoglobin On Oxy 30s daily as needed IV Dilaudid as needed, consider change to PCA if needed

## 2022-06-27 NOTE — Assessment & Plan Note (Signed)
Negative for COVID and flu Due to complaints of shortness of breath with chest pain patient encouraged to go to the emergency department for further evaluation to rule out PE, pneumothorax.  Smoking cessation encouraged

## 2022-06-28 ENCOUNTER — Encounter (HOSPITAL_COMMUNITY): Payer: Self-pay | Admitting: Family Medicine

## 2022-06-28 DIAGNOSIS — D5700 Hb-SS disease with crisis, unspecified: Secondary | ICD-10-CM | POA: Diagnosis present

## 2022-06-28 DIAGNOSIS — J189 Pneumonia, unspecified organism: Secondary | ICD-10-CM | POA: Diagnosis not present

## 2022-06-28 LAB — RAPID URINE DRUG SCREEN, HOSP PERFORMED
Amphetamines: NOT DETECTED
Barbiturates: NOT DETECTED
Benzodiazepines: NOT DETECTED
Cocaine: NOT DETECTED
Opiates: NOT DETECTED
Tetrahydrocannabinol: NOT DETECTED

## 2022-06-28 LAB — CULTURE, BLOOD (ROUTINE X 2)

## 2022-06-28 MED ORDER — SODIUM CHLORIDE 0.9 % IV SOLN
1.0000 g | INTRAVENOUS | Status: DC
  Administered 2022-06-28: 1 g via INTRAVENOUS
  Filled 2022-06-28: qty 10

## 2022-06-28 MED ORDER — SODIUM CHLORIDE 0.9 % IV SOLN
500.0000 mg | INTRAVENOUS | Status: DC
  Administered 2022-06-28: 500 mg via INTRAVENOUS
  Filled 2022-06-28 (×2): qty 5

## 2022-06-28 NOTE — Progress Notes (Signed)
Subjective: Dean Webb is a 47 year old male with a medical history significant for sickle cell disease, chronic pain syndrome, opiate dependence and tolerance, and history of anemia of chronic disease was admitted with community acquired pneumonia in the setting of sickle cell disease.   Patient says that chest pain is improving. He continues to have pain to chest and rates pain at 5/10.   He denies headache, shortness of breath, dizziness, nausea, vomiting, or diarrhea.   Objective:  Vital signs in last 24 hours:  Vitals:   06/27/22 1952 06/27/22 2348 06/28/22 0344 06/28/22 0743  BP: (!) 154/104 134/89 124/73 117/88  Pulse: 96 73 62 75  Resp: 16 16 16 18   Temp: 98 F (36.7 C) 98.5 F (36.9 C) 98.6 F (37 C) 98.3 F (36.8 C)  TempSrc: Oral Oral Oral Oral  SpO2: 96% 96% 98% 94%    Intake/Output from previous day:   Intake/Output Summary (Last 24 hours) at 06/28/2022 1318 Last data filed at 06/28/2022 0743 Gross per 24 hour  Intake --  Output 950 ml  Net -950 ml    Physical Exam: General: Alert, awake, oriented x3, in no acute distress.  HEENT: [place]/AT PEERL, EOMI Neck: Trachea midline,  no masses, no thyromegal,y no JVD, no carotid bruit OROPHARYNX:  Moist, No exudate/ erythema/lesions.  Heart: Regular rate and rhythm, without murmurs, rubs, gallops, PMI non-displaced, no heaves or thrills on palpation.  Lungs: Clear to auscultation, no wheezing or rhonchi noted. No increased vocal fremitus resonant to percussion  Abdomen: Soft, nontender, nondistended, positive bowel sounds, no masses no hepatosplenomegaly noted..  Neuro: No focal neurological deficits noted cranial nerves II through XII grossly intact. DTRs 2+ bilaterally upper and lower extremities. Strength 5 out of 5 in bilateral upper and lower extremities. Musculoskeletal: No warm swelling or erythema around joints, no spinal tenderness noted. Psychiatric: Patient alert and oriented x3, good insight and cognition,  good recent to remote recall. Lymph node survey: No cervical axillary or inguinal lymphadenopathy noted.  Lab Results:  Basic Metabolic Panel:    Component Value Date/Time   NA 135 06/27/2022 1624   NA 136 04/17/2022 1529   K 3.6 06/27/2022 1624   CL 101 06/27/2022 1624   CO2 26 06/27/2022 1624   BUN 9 06/27/2022 1624   BUN 9 04/17/2022 1529   CREATININE 1.05 06/27/2022 1624   GLUCOSE 116 (H) 06/27/2022 1624   CALCIUM 10.5 (H) 06/27/2022 1624   CBC:    Component Value Date/Time   WBC 14.5 (H) 06/27/2022 1624   HGB 12.1 (L) 06/27/2022 1624   HGB 13.6 04/17/2022 1529   HCT 32.4 (L) 06/27/2022 1624   HCT 39.8 04/17/2022 1529   PLT 343 06/27/2022 1624   PLT 375 04/17/2022 1529   MCV 76.6 (L) 06/27/2022 1624   MCV 85 04/17/2022 1529   NEUTROABS 4.2 04/17/2022 1529   LYMPHSABS 3.9 (H) 04/17/2022 1529   MONOABS 1.3 (H) 12/01/2019 0548   EOSABS 0.5 (H) 04/17/2022 1529   BASOSABS 0.1 04/17/2022 1529    Recent Results (from the past 240 hour(s))  Blood culture (routine x 2)     Status: None (Preliminary result)   Collection Time: 06/27/22  4:29 PM   Specimen: BLOOD  Result Value Ref Range Status   Specimen Description   Final    BLOOD RIGHT ANTECUBITAL Performed at [facility], [address] [place], [place] [address]    Special Requests   Final    Blood Culture  adequate volume BOTTLES DRAWN AEROBIC AND ANAEROBIC Performed at [facility], [address], [place], [place] [address]    Culture   Final    NO GROWTH < 12 HOURS Performed at [facility], [address], [place], [place] [address]    Report Status PENDING  Incomplete  Resp panel by RT-PCR (RSV, Flu A&B, Covid) Anterior Nasal Swab     Status: None   Collection Time: 06/27/22  4:32 PM   Specimen: Anterior Nasal Swab  Result Value Ref Range Status   SARS Coronavirus 2 by RT PCR NEGATIVE NEGATIVE Final    Comment: (NOTE) SARS-CoV-2 target nucleic acids are NOT  DETECTED.  The SARS-CoV-2 RNA is generally detectable in upper respiratory specimens during the acute phase of infection. The lowest concentration of SARS-CoV-2 viral copies this assay can detect is 138 copies/mL. A negative result does not preclude SARS-Cov-2 infection and should not be used as the sole basis for treatment or other patient management decisions. A negative result may occur with  improper specimen collection/handling, submission of specimen other than nasopharyngeal swab, presence of viral mutation(s) within the areas targeted by this assay, and inadequate number of viral copies(<138 copies/mL). A negative result must be combined with clinical observations, patient history, and epidemiological information. The expected result is Negative.  Fact Sheet for Patients:  BloggerCourse.com  Fact Sheet for Healthcare Providers:  SeriousBroker.it  This test is no t yet approved or cleared by the [place] FDA and  has been authorized for detection and/or diagnosis of SARS-CoV-2 by FDA under an Emergency Use Authorization (EUA). This EUA will remain  in effect (meaning this test can be used) for the duration of the COVID-19 declaration under Section 564(b)(1) of the Act, 21 U.S.C.section 360bbb-3(b)(1), unless the authorization is terminated  or revoked sooner.       Influenza A by PCR NEGATIVE NEGATIVE Final   Influenza B by PCR NEGATIVE NEGATIVE Final    Comment: (NOTE) The Xpert Xpress SARS-CoV-2/FLU/RSV plus assay is intended as an aid in the diagnosis of influenza from Nasopharyngeal swab specimens and should not be used as a sole basis for treatment. Nasal washings and aspirates are unacceptable for Xpert Xpress SARS-CoV-2/FLU/RSV testing.  Fact Sheet for Patients: BloggerCourse.com  Fact Sheet for Healthcare Providers: SeriousBroker.it  This test is not yet  approved or cleared by the [place] FDA and has been authorized for detection and/or diagnosis of SARS-CoV-2 by FDA under an Emergency Use Authorization (EUA). This EUA will remain in effect (meaning this test can be used) for the duration of the COVID-19 declaration under Section 564(b)(1) of the Act, 21 U.S.C. section 360bbb-3(b)(1), unless the authorization is terminated or revoked.     Resp Syncytial Virus by PCR NEGATIVE NEGATIVE Final    Comment: (NOTE) Fact Sheet for Patients: BloggerCourse.com  Fact Sheet for Healthcare Providers: SeriousBroker.it  This test is not yet approved or cleared by the [place] FDA and has been authorized for detection and/or diagnosis of SARS-CoV-2 by FDA under an Emergency Use Authorization (EUA). This EUA will remain in effect (meaning this test can be used) for the duration of the COVID-19 declaration under Section 564(b)(1) of the Act, 21 U.S.C. section 360bbb-3(b)(1), unless the authorization is terminated or revoked.  Performed at [facility], [address], [place], [place] [address]   Blood culture (routine x 2)     Status: None (Preliminary result)   Collection Time: 06/27/22  4:39 PM  Specimen: BLOOD RIGHT HAND  Result Value Ref Range Status   Specimen Description   Final    BLOOD RIGHT HAND Performed at [facility], [address], [place], [place] [address]    Special Requests   Final    BOTTLES DRAWN AEROBIC AND ANAEROBIC Blood Culture results may not be optimal due to an inadequate volume of blood received in culture bottles Performed at [facility], [address] [place], [place] [address]    Culture   Final    NO GROWTH < 12 HOURS Performed at [facility], [address] [place] [address], [place], [place] [address]    Report Status PENDING  Incomplete    Studies/Results: DG Chest 2 View  Result Date:  06/27/2022 CLINICAL DATA:  Chest pain EXAM: CHEST - 2 VIEW COMPARISON:  X-ray 11/26/2019 and older FINDINGS: Focal consolidative opacity at the left lung base, involving the lingula in particular. Lower lobe involvement is also possible. Recommend follow-up to confirm clearance. No pneumothorax, edema or effusion. Normal cardiopericardial silhouette. Overlapping cardiac leads. Healed right clavicle fracture, remote. IMPRESSION: Consolidative opacity in the left lung base, lingula and possibly left lower lobe. Recommend follow-up to confirm clearance. No significant effusion Electronically Signed   By: Karen Kays M.D.   On: 06/27/2022 17:39    Medications: Scheduled Meds:  amLODipine  10 mg Oral Daily   cloNIDine  0.1 mg Oral BID   DULoxetine  30 mg Oral Daily   enoxaparin (LOVENOX) injection  40 mg Subcutaneous Q24H   losartan  100 mg Oral Daily   And   hydrochlorothiazide  25 mg Oral Daily   loratadine  10 mg Oral Daily   metoprolol succinate  50 mg Oral Daily   montelukast  10 mg Oral QHS   nicotine  14 mg Transdermal Once   pantoprazole  40 mg Oral Daily   Continuous Infusions:  azithromycin     cefTRIAXone (ROCEPHIN)  IV     dextrose 5 % and 0.45% NaCl 75 mL/hr at 06/28/22 0830   PRN Meds:.albuterol, HYDROmorphone (DILAUDID) injection, ondansetron (ZOFRAN) IV, oxycodone  Consultants: None  Procedures: None  Antibiotics: Azithromycin Ceftriaxone  Assessment/Plan: Principal Problem:   Pneumonia Active Problems:   Essential hypertension   Sickle cell-hemoglobin C disease without crisis (HCC)   Asthma without status asthmaticus   Tobacco dependence   Seasonal allergies   GERD (gastroesophageal reflux disease)   Hypercalcemia   Sickle cell pain crisis (HCC)   Community acquired pneumonia:  Continue IV antibiotics Patient does not have an oxygen requirement at this time, supplemental oxygen as needed Continue incentive spirometer Decrease IV fluids to [facility] Monitor  vital signs closely, reevaluate pain scale regularly  Anemia of chronic disease:  Hemoglobin is stable and consistent with patient's baseline.  There is no clinical indication for blood transfusion at this time. Follow labs in AM.  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  Continue Oxycodone 30 every 6 hours as needed for severe breakthrough pain  Folic acid 1 mg daily  Essential hypertension Stable. Continue home medications  Tobacco dependence:  Patient counseled on admission. Continue nicotine patches  History of mild intermittent asthma: Continue albuterol as needed  Code Status: Full Code Family Communication: N/A Disposition Plan: Not yet ready for discharge  Nolon Nations  APRN, MSN, FNP-C [facility] [address] [place], [place] [address] [phone]  If 7PM-7AM,  please contact night-coverage.  06/28/2022, 1:18 PM  LOS: 1 day

## 2022-06-28 NOTE — TOC Progression Note (Signed)
Transition of Care [facility]) - Progression Note    Patient Details  Name: Dean Webb MRN: [id number] Date of Birth: [birth year]  Transition of Care [facility]) CM/SW Contact  Beckie Busing, RN Phone Number:[phone]  06/28/2022, 3:55 PM  Clinical Narrative:     Transition of Care (TOC) Screening Note   Patient Details  Name: Dean Webb Date of Birth: [birth year]   Transition of Care [facility]) CM/SW Contact:    Beckie Busing, RN Phone Number: 06/28/2022, 3:55 PM    Transition of Care Department [facility]) has reviewed patient and no TOC needs have been identified at this time. We will continue to monitor patient advancement through interdisciplinary progression rounds. If new patient transition needs arise, please place a TOC consult.          Expected Discharge Plan and Services                                               Social Determinants of Health (SDOH) Interventions SDOH Screenings   Food Insecurity: No Food Insecurity (06/28/2022)  Housing: Low Risk  (06/28/2022)  Transportation Needs: No Transportation Needs (06/28/2022)  Utilities: Not At Risk (06/28/2022)  Depression (PHQ2-9): Low Risk  (04/17/2022)  Tobacco Use: High Risk (06/28/2022)    Readmission Risk Interventions     No data to display

## 2022-06-29 ENCOUNTER — Telehealth: Payer: Self-pay | Admitting: Nurse Practitioner

## 2022-06-29 ENCOUNTER — Other Ambulatory Visit: Payer: Self-pay

## 2022-06-29 DIAGNOSIS — D57219 Sickle-cell/Hb-C disease with crisis, unspecified: Secondary | ICD-10-CM

## 2022-06-29 DIAGNOSIS — G894 Chronic pain syndrome: Secondary | ICD-10-CM

## 2022-06-29 DIAGNOSIS — J189 Pneumonia, unspecified organism: Secondary | ICD-10-CM | POA: Diagnosis not present

## 2022-06-29 LAB — COMPREHENSIVE METABOLIC PANEL
ALT: 53 U/L — ABNORMAL HIGH (ref 0–44)
AST: 43 U/L — ABNORMAL HIGH (ref 15–41)
Albumin: 3.4 g/dL — ABNORMAL LOW (ref 3.5–5.0)
Alkaline Phosphatase: 100 U/L (ref 38–126)
Anion gap: 5 (ref 5–15)
BUN: 9 mg/dL (ref 6–20)
CO2: 26 mmol/L (ref 22–32)
Calcium: 9.8 mg/dL (ref 8.9–10.3)
Chloride: 102 mmol/L (ref 98–111)
Creatinine, Ser: 1.02 mg/dL (ref 0.61–1.24)
GFR, Estimated: >60 mL/min (ref >60)
Glucose, Bld: 92 mg/dL (ref 70–99)
Potassium: 3.6 mmol/L (ref 3.5–5.1)
Sodium: 133 mmol/L — ABNORMAL LOW (ref 135–145)
Total Bilirubin: 1.9 mg/dL — ABNORMAL HIGH (ref 0.3–1.2)
Total Protein: 7.2 g/dL (ref 6.5–8.1)

## 2022-06-29 LAB — CBC WITH DIFFERENTIAL/PLATELET
Abs Immature Granulocytes: 0.07 10*3/uL (ref 0.00–0.07)
Basophils Absolute: 0.1 10*3/uL (ref 0.0–0.1)
Basophils Relative: 0 %
Eosinophils Absolute: 0.4 10*3/uL (ref 0.0–0.5)
Eosinophils Relative: 3 %
HCT: 31.7 % — ABNORMAL LOW (ref 39.0–52.0)
Hemoglobin: 11.7 g/dL — ABNORMAL LOW (ref 13.0–17.0)
Immature Granulocytes: 1 %
Lymphocytes Relative: 20 %
Lymphs Abs: 2.5 10*3/uL (ref 0.7–4.0)
MCH: 28.2 pg (ref 26.0–34.0)
MCHC: 36.9 g/dL — ABNORMAL HIGH (ref 30.0–36.0)
MCV: 76.4 fL — ABNORMAL LOW (ref 80.0–100.0)
Monocytes Absolute: 2 10*3/uL — ABNORMAL HIGH (ref 0.1–1.0)
Monocytes Relative: 16 %
Neutro Abs: 7.5 10*3/uL (ref 1.7–7.7)
Neutrophils Relative %: 60 %
Platelets: 430 10*3/uL — ABNORMAL HIGH (ref 150–400)
RBC: 4.15 MIL/uL — ABNORMAL LOW (ref 4.22–5.81)
RDW: 16.7 % — ABNORMAL HIGH (ref 11.5–15.5)
WBC: 12.5 10*3/uL — ABNORMAL HIGH (ref 4.0–10.5)
nRBC: 0.4 % — ABNORMAL HIGH (ref 0.0–0.2)

## 2022-06-29 LAB — PARATHYROID HORMONE, INTACT (NO CA): PTH: 25 pg/mL (ref 15–65)

## 2022-06-29 LAB — CULTURE, BLOOD (ROUTINE X 2): Culture: NO GROWTH

## 2022-06-29 LAB — CALCIUM, IONIZED: Calcium, Ionized, Serum: 6 mg/dL — ABNORMAL HIGH (ref 4.5–5.6)

## 2022-06-29 MED ORDER — AMOXICILLIN-POT CLAVULANATE 875-125 MG PO TABS: 1.0000 | ORAL_TABLET | Freq: Two times a day (BID) | ORAL | Status: DC

## 2022-06-29 NOTE — Plan of Care (Signed)

## 2022-06-29 NOTE — Discharge Summary (Signed)
Physician Discharge Summary  Dean Webb ZOX:[id number] DOB: February 02, 1976 DOA: 06/27/2022  PCP: Ivonne Andrew, NP  Admit date: 06/27/2022  Discharge date: 06/29/2022  Discharge Diagnoses:  Principal Problem:   Pneumonia Active Problems:   Essential hypertension   Sickle cell-hemoglobin C disease without crisis (HCC)   Asthma without status asthmaticus   Tobacco dependence   Seasonal allergies   GERD (gastroesophageal reflux disease)   Hypercalcemia   Sickle cell pain crisis [facility])   Discharge Condition: Stable  Disposition:  Pt is discharged home in good condition and is to follow up with Ivonne Andrew, NP this week to have labs evaluated. Dean Webb is instructed to increase activity slowly and balance with rest for the next few days, and use prescribed medication to complete treatment of pain  Diet: Regular Wt Readings from Last 3 Encounters:  06/28/22 94.4 kg  06/27/22 94.4 kg  04/17/22 94.4 kg    History of present illness:  Dean Webb is a 47 year old male with a medical history significant for sickle cell disease type [place], hypertension, and GERD that presents with several day history of cough and left-sided chest pain.  He has ongoing tobacco use and smokes approximately half pack per day.  He reports subjective fevers at home and is coughing up clear sputum.  He denies hemoptysis.  He denies sick contacts.  In the ED, he has noted to have left lower lobe consolidation consistent with pneumonia versus acute chest.  His lactic acid is noted to be normal.  He has an elevated WBC of 14.5.  Patient has normal troponins.  Hospital Course:  Community-acquired pneumonia: Patient admitted for community-acquired pneumonia and was initially treated with IV antibiotics.  He was transition to Augmentin 875-125 mg every 12 hours for an additional 7 days.  Patient did not have an oxygen requirement throughout admission.  Recommend continuing incentive spirometer post  discharge.  Sickle cell disease: Patient has chronic pain associated with the sickle cell disease.  His home medications were continued throughout his admission.  He did not warrant sickle cell protocol.  He will resume his home medications and follow-up with his PCP for medication management.  Abnormal lab: During admission, TSH slightly elevated.  Patient advised to return to clinic in 2 weeks to repeat.  Patient is aware of all upcoming appointments.  He is alert, oriented, and ambulating without assistance. Patient was admitted for sickle cell pain crisis and managed appropriately with IVF, IV Dilaudid via PCA and IV Toradol, as well as other adjunct therapies per sickle cell pain management protocols.  Patient was therefore discharged home today in a hemodynamically stable condition.   Dean Webb will follow-up with PCP within 1 week of this discharge. Dean Webb was counseled extensively about nonpharmacologic means of pain management, patient verbalized understanding and was appreciative of  the care received during this admission.   We discussed the need for good hydration, monitoring of hydration status, avoidance of heat, cold, stress, and infection triggers. We discussed the need to be adherent with taking home medications. Patient was reminded of the need to seek medical attention immediately if any symptom of bleeding, anemia, or infection occurs.  Discharge Exam: Vitals:   06/28/22 2100 06/29/22 0527  BP: 109/73 119/75  Pulse: 85 74  Resp:  18  Temp:  98.5 F (36.9 C)  SpO2:  93%   Vitals:   06/28/22 1459 06/28/22 2026 06/28/22 2100 06/29/22 0527  BP:  99/68 109/73 119/75  Pulse:  87 85  74  Resp:  20  18  Temp:  98.4 F (36.9 C)  98.5 F (36.9 C)  TempSrc:  Oral  Oral  SpO2:  97%  93%  Weight: 94.4 kg     Height: 5\' 7"  (1.702 m)       General appearance : Awake, alert, not in any distress. Speech Clear. Not toxic looking HEENT: Atraumatic and Normocephalic, pupils  equally reactive to light and accomodation Neck: Supple, no JVD. No cervical lymphadenopathy.  Chest: Good air entry bilaterally, no added sounds  CVS: S1 S2 regular, no murmurs.  Abdomen: Bowel sounds present, Non tender and not distended with no gaurding, rigidity or rebound. Extremities: B/L Lower Ext shows no edema, both legs are warm to touch Neurology: Awake alert, and oriented X 3, CN II-XII intact, Non focal Skin: No Rash  Discharge Instructions  Discharge Instructions     Discharge patient   Complete by: As directed    Discharge disposition: 01-Home or Self Care   Discharge patient date: 06/29/2022      Allergies as of 06/29/2022   No Known Allergies      Medication List     TAKE these medications    albuterol 108 (90 Base) MCG/ACT inhaler Commonly known as: VENTOLIN HFA Inhale 2 puffs into the lungs every 6 (six) hours as needed for wheezing or shortness of breath. What changed: how much to take   amLODipine 10 MG tablet Commonly known as: NORVASC Take 1 tablet (10 mg total) by mouth daily.   cetirizine 10 MG tablet Commonly known as: ZYRTEC TAKE 1 TABLET BY MOUTH EVERY DAY AS NEEDED FOR ALLERGY What changed:  how much to take how to take this when to take this reasons to take this additional instructions   cloNIDine 0.1 MG tablet Commonly known as: CATAPRES Take 1 tablet (0.1 mg total) by mouth 2 (two) times daily.   DULoxetine 30 MG capsule Commonly known as: Cymbalta Take 1 capsule (30 mg total) by mouth daily.   ibuprofen 800 MG tablet Commonly known as: ADVIL Take 1 tablet (800 mg total) by mouth every 8 (eight) hours as needed. What changed: reasons to take this   losartan-hydrochlorothiazide 100-25 MG tablet Commonly known as: HYZAAR Take 1 tablet by mouth daily.   metoprolol succinate 50 MG 24 hr tablet Commonly known as: TOPROL-XL Take 1 tablet (50 mg total) by mouth daily. Take with or immediately following a meal.   montelukast  10 MG tablet Commonly known as: SINGULAIR TAKE 1 TABLET BY MOUTH EVERYDAY AT BEDTIME What changed:  how much to take how to take this when to take this additional instructions   oxycodone 30 MG immediate release tablet Commonly known as: ROXICODONE Take 1 tablet (30 mg total) by mouth every 6 (six) hours as needed for pain.   pantoprazole 40 MG tablet Commonly known as: PROTONIX Take 1 tablet (40 mg total) by mouth daily. What changed:  when to take this additional instructions   tiZANidine 4 MG tablet Commonly known as: Zanaflex Take 1 tablet (4 mg total) by mouth every 6 (six) hours as needed for muscle spasms.   Vitamin D (Ergocalciferol) 1.25 MG (50000 UNIT) Caps capsule Commonly known as: DRISDOL Take 1 capsule (50,000 Units total) by mouth every 7 (seven) days.        The results of significant diagnostics from this hospitalization (including imaging, microbiology, ancillary and laboratory) are listed below for reference.    Significant Diagnostic Studies: DG Chest 2  View  Result Date: 06/27/2022 CLINICAL DATA:  Chest pain EXAM: CHEST - 2 VIEW COMPARISON:  X-ray 11/26/2019 and older FINDINGS: Focal consolidative opacity at the left lung base, involving the lingula in particular. Lower lobe involvement is also possible. Recommend follow-up to confirm clearance. No pneumothorax, edema or effusion. Normal cardiopericardial silhouette. Overlapping cardiac leads. Healed right clavicle fracture, remote. IMPRESSION: Consolidative opacity in the left lung base, lingula and possibly left lower lobe. Recommend follow-up to confirm clearance. No significant effusion Electronically Signed   By: Karen Kays M.D.   On: 06/27/2022 17:39    Microbiology: Recent Results (from the past 240 hour(s))  Blood culture (routine x 2)     Status: None (Preliminary result)   Collection Time: 06/27/22  4:29 PM   Specimen: BLOOD  Result Value Ref Range Status   Specimen Description   Final     BLOOD RIGHT ANTECUBITAL Performed at [facility], [address], [place], [place] [address]    Special Requests   Final    Blood Culture adequate volume BOTTLES DRAWN AEROBIC AND ANAEROBIC Performed at [facility], [address], [place], [place] [address]    Culture   Final    NO GROWTH 2 DAYS Performed at [facility], [address], [place], [place] [address]    Report Status PENDING  Incomplete  Resp panel by RT-PCR (RSV, Flu A&B, Covid) Anterior Nasal Swab     Status: None   Collection Time: 06/27/22  4:32 PM   Specimen: Anterior Nasal Swab  Result Value Ref Range Status   SARS Coronavirus 2 by RT PCR NEGATIVE NEGATIVE Final    Comment: (NOTE) SARS-CoV-2 target nucleic acids are NOT DETECTED.  The SARS-CoV-2 RNA is generally detectable in upper respiratory specimens during the acute phase of infection. The lowest concentration of SARS-CoV-2 viral copies this assay can detect is 138 copies/mL. A negative result does not preclude SARS-Cov-2 infection and should not be used as the sole basis for treatment or other patient management decisions. A negative result may occur with  improper specimen collection/handling, submission of specimen other than nasopharyngeal swab, presence of viral mutation(s) within the areas targeted by this assay, and inadequate number of viral copies(<138 copies/mL). A negative result must be combined with clinical observations, patient history, and epidemiological information. The expected result is Negative.  Fact Sheet for Patients:  BloggerCourse.com  Fact Sheet for Healthcare Providers:  SeriousBroker.it  This test is no t yet approved or cleared by the [place] FDA and  has been authorized for detection and/or diagnosis of SARS-CoV-2 by FDA under an Emergency Use Authorization (EUA). This EUA will remain  in effect (meaning this test can be used) for  the duration of the COVID-19 declaration under Section 564(b)(1) of the Act, 21 U.S.C.section 360bbb-3(b)(1), unless the authorization is terminated  or revoked sooner.       Influenza A by PCR NEGATIVE NEGATIVE Final   Influenza B by PCR NEGATIVE NEGATIVE Final    Comment: (NOTE) The Xpert Xpress SARS-CoV-2/FLU/RSV plus assay is intended as an aid in the diagnosis of influenza from Nasopharyngeal swab specimens and should not be used as a sole basis for treatment. Nasal washings and aspirates are unacceptable for Xpert Xpress SARS-CoV-2/FLU/RSV testing.  Fact Sheet for Patients: BloggerCourse.com  Fact Sheet for Healthcare Providers: SeriousBroker.it  This test is not yet approved or cleared by the [place] FDA and has been authorized for detection and/or diagnosis of SARS-CoV-2  by FDA under an Emergency Use Authorization (EUA). This EUA will remain in effect (meaning this test can be used) for the duration of the COVID-19 declaration under Section 564(b)(1) of the Act, 21 U.S.C. section 360bbb-3(b)(1), unless the authorization is terminated or revoked.     Resp Syncytial Virus by PCR NEGATIVE NEGATIVE Final    Comment: (NOTE) Fact Sheet for Patients: BloggerCourse.com  Fact Sheet for Healthcare Providers: SeriousBroker.it  This test is not yet approved or cleared by the [place] FDA and has been authorized for detection and/or diagnosis of SARS-CoV-2 by FDA under an Emergency Use Authorization (EUA). This EUA will remain in effect (meaning this test can be used) for the duration of the COVID-19 declaration under Section 564(b)(1) of the Act, 21 U.S.C. section 360bbb-3(b)(1), unless the authorization is terminated or revoked.  Performed at [facility], [address], [place], [place] [address]   Blood culture (routine x 2)     Status: None  (Preliminary result)   Collection Time: 06/27/22  4:39 PM   Specimen: BLOOD RIGHT HAND  Result Value Ref Range Status   Specimen Description   Final    BLOOD RIGHT HAND Performed at [facility], [address] [place], [place] [address]    Special Requests   Final    BOTTLES DRAWN AEROBIC AND ANAEROBIC Blood Culture results may not be optimal due to an inadequate volume of blood received in culture bottles Performed at [facility], [address] [place], [place] [address]    Culture   Final    NO GROWTH 2 DAYS Performed at [facility], [address] [place], [place] [address]    Report Status PENDING  Incomplete     Labs: Basic Metabolic Panel: Recent Labs  Lab 06/27/22 1624 06/29/22 0951  NA 135 133*  K 3.6 3.6  CL 101 102  CO2 26 26  GLUCOSE 116* 92  BUN 9 9  CREATININE 1.05 1.02  CALCIUM 10.5* 9.8   Liver Function Tests: Recent Labs  Lab 06/29/22 0951  AST 43*  ALT 53*  ALKPHOS 100  BILITOT 1.9*  PROT 7.2  ALBUMIN 3.4*   No results for input(s): "LIPASE", "AMYLASE" in the last 168 hours. No results for input(s): "AMMONIA" in the last 168 hours. CBC: Recent Labs  Lab 06/27/22 1624 06/29/22 0951  WBC 14.5* 12.5*  NEUTROABS  --  7.5  HGB 12.1* 11.7*  HCT 32.4* 31.7*  MCV 76.6* 76.4*  PLT 343 430*   Cardiac Enzymes: No results for input(s): "CKTOTAL", "CKMB", "CKMBINDEX", "TROPONINI" in the last 168 hours. BNP: Invalid input(s): "POCBNP" CBG: No results for input(s): "GLUCAP" in the last 168 hours.  Time coordinating discharge: 30 minutes  Signed:  Nolon Nations  APRN, MSN, FNP-C Patient Care [facility] [address] [place], [place] [address] [phone]  [facility] 06/29/2022, 11:14 AM

## 2022-06-29 NOTE — Telephone Encounter (Signed)
Please advise KH 

## 2022-06-29 NOTE — Telephone Encounter (Signed)
Caller & Relationship to patient:  MRN #  [id number]   Call Back Number:   Date of Last Office Visit: 06/27/2022     Date of Next Office Visit: 07/17/2022    Medication(s) to be Refilled: Oxycodone   Preferred Pharmacy:   ** Please notify patient to allow 48-72 hours to process** **Let patient know to contact pharmacy at the end of the day to make sure medication is ready. ** **If patient has not been seen in a year or longer, book an appointment **Advise to use MyChart for refill requests OR to contact their pharmacy

## 2022-06-29 NOTE — Telephone Encounter (Signed)
From: Granville Lewis Zentner To: Office of Ivonne Andrew, NP Sent: 06/29/2022 12:31 PM EDT Subject: Medication Renewal Request  Refills have been requested for the following medications:   oxycodone (ROXICODONE) 30 MG immediate release tablet Dean Webb]  Preferred pharmacy: [id number] [place], [place] - [address] Delivery method: [employer]

## 2022-06-30 ENCOUNTER — Telehealth: Payer: Self-pay | Admitting: Nurse Practitioner

## 2022-06-30 ENCOUNTER — Telehealth: Payer: Self-pay

## 2022-06-30 ENCOUNTER — Other Ambulatory Visit: Payer: Self-pay | Admitting: Family Medicine

## 2022-06-30 MED ORDER — AMOXICILLIN-POT CLAVULANATE 875-125 MG PO TABS: 1.0000 | ORAL_TABLET | Freq: Two times a day (BID) | ORAL | 0 refills | Status: DC

## 2022-06-30 NOTE — Progress Notes (Signed)
Meds ordered this encounter  Medications   amoxicillin-clavulanate (AUGMENTIN) 875-125 MG tablet    Sig: Take 1 tablet by mouth 2 (two) times daily.    Dispense:  14 tablet    Refill:  0    Order Specific Question:   Supervising Provider    Answer:   Quentin Angst [[id number]]   Nolon Nations  APRN, MSN, FNP-C Patient Care [facility] [address] [place], [place] [address] [phone]

## 2022-06-30 NOTE — Transitions of Care (Post Inpatient/ED Visit) (Signed)
   06/30/2022  Name: CORDALE FILSINGER MRN: [id number] DOB: 1975-10-19  Today's TOC FU Call Status: Today's TOC FU Call Status:: Unsuccessul Call (1st Attempt) Unsuccessful Call (1st Attempt) Date: 06/30/22  Attempted to reach the patient regarding the most recent Inpatient/ED visit.  Follow Up Plan: Additional outreach attempts will be made to reach the patient to complete the Transitions of Care (Post Inpatient/ED visit) call.   Jodelle Gross, RN, BSN, CCM Care Management Coordinator [place]

## 2022-06-30 NOTE — Telephone Encounter (Signed)
Caller & Relationship to patient:  MRN #  [id number]   Call Back Number:   Date of Last Office Visit: 06/29/2022     Date of Next Office Visit: 07/17/2022    Medication(s) to be Refilled: ANTIBIOTICS   Preferred Pharmacy:   ** Please notify patient to allow 48-72 hours to process** **Let patient know to contact pharmacy at the end of the day to make sure medication is ready. ** **If patient has not been seen in a year or longer, book an appointment **Advise to use MyChart for refill requests OR to contact their pharmacy

## 2022-07-01 LAB — CULTURE, BLOOD (ROUTINE X 2): Culture: NO GROWTH

## 2022-07-02 LAB — CULTURE, BLOOD (ROUTINE X 2)

## 2022-07-03 ENCOUNTER — Telehealth: Payer: Self-pay | Admitting: Family Medicine

## 2022-07-03 ENCOUNTER — Other Ambulatory Visit: Payer: Self-pay

## 2022-07-03 DIAGNOSIS — D57219 Sickle-cell/Hb-C disease with crisis, unspecified: Secondary | ICD-10-CM

## 2022-07-03 DIAGNOSIS — G894 Chronic pain syndrome: Secondary | ICD-10-CM

## 2022-07-03 MED ORDER — OXYCODONE HCL 30 MG PO TABS
30.0000 mg | ORAL_TABLET | Freq: Four times a day (QID) | ORAL | 0 refills | Status: DC | PRN
Start: 2022-07-03 — End: 2022-07-17

## 2022-07-03 NOTE — Telephone Encounter (Signed)
error 

## 2022-07-03 NOTE — Telephone Encounter (Signed)
Please advise KH 

## 2022-07-03 NOTE — Telephone Encounter (Signed)
From: Granville Lewis Noy To: Office of Ivonne Andrew, NP Sent: 07/03/2022 9:16 AM EDT Subject: Medication Renewal Request  Refills have been requested for the following medications:   oxycodone (ROXICODONE) 30 MG immediate release tablet Dean Webb]  Preferred pharmacy: [id number] [place], [place] - [address] Delivery method: [employer]

## 2022-07-04 ENCOUNTER — Telehealth: Payer: Self-pay

## 2022-07-04 NOTE — Transitions of Care (Post Inpatient/ED Visit) (Signed)
   07/04/2022  Name: Dean Webb MRN: [id number] DOB: 09-18-1975  Today's TOC FU Call Status: Today's TOC FU Call Status:: Unsuccessful Call (2nd Attempt) Unsuccessful Call (2nd Attempt) Date: 07/04/22  Attempted to reach the patient regarding the most recent Inpatient/ED visit.  Follow Up Plan: Additional outreach attempts will be made to reach the patient to complete the Transitions of Care (Post Inpatient/ED visit) call.   Jodelle Gross, RN, BSN, CCM Care Management Coordinator [place]

## 2022-07-05 ENCOUNTER — Telehealth: Payer: Self-pay

## 2022-07-05 NOTE — Transitions of Care (Post Inpatient/ED Visit) (Signed)
   07/05/2022  Name: Dean Webb MRN: [id number] DOB: 1975-10-03  Today's TOC FU Call Status: Today's TOC FU Call Status:: Unsuccessful Call (3rd Attempt) Unsuccessful Call (3rd Attempt) Date: 07/05/22  Attempted to reach the patient regarding the most recent Inpatient/ED visit.  Follow Up Plan: No further outreach attempts will be made at this time. We have been unable to contact the patient.  Jodelle Gross, RN, BSN, CCM Care Management Coordinator [place]

## 2022-07-16 ENCOUNTER — Other Ambulatory Visit: Payer: Self-pay | Admitting: Nurse Practitioner

## 2022-07-16 DIAGNOSIS — I10 Essential (primary) hypertension: Secondary | ICD-10-CM

## 2022-07-17 ENCOUNTER — Ambulatory Visit (INDEPENDENT_AMBULATORY_CARE_PROVIDER_SITE_OTHER): Payer: 59 | Admitting: Nurse Practitioner

## 2022-07-17 ENCOUNTER — Encounter: Payer: Self-pay | Admitting: Nurse Practitioner

## 2022-07-17 VITALS — BP 136/78 | HR 96 | Temp 97.2°F | Ht 68.0 in | Wt 211.8 lb

## 2022-07-17 DIAGNOSIS — I739 Peripheral vascular disease, unspecified: Secondary | ICD-10-CM | POA: Diagnosis not present

## 2022-07-17 DIAGNOSIS — Z6831 Body mass index (BMI) 31.0-31.9, adult: Secondary | ICD-10-CM | POA: Diagnosis not present

## 2022-07-17 DIAGNOSIS — E669 Obesity, unspecified: Secondary | ICD-10-CM | POA: Diagnosis not present

## 2022-07-17 DIAGNOSIS — D57219 Sickle-cell/Hb-C disease with crisis, unspecified: Secondary | ICD-10-CM

## 2022-07-17 DIAGNOSIS — I10 Essential (primary) hypertension: Secondary | ICD-10-CM | POA: Diagnosis not present

## 2022-07-17 DIAGNOSIS — M62838 Other muscle spasm: Secondary | ICD-10-CM | POA: Diagnosis not present

## 2022-07-17 DIAGNOSIS — D571 Sickle-cell disease without crisis: Secondary | ICD-10-CM | POA: Diagnosis not present

## 2022-07-17 DIAGNOSIS — J189 Pneumonia, unspecified organism: Secondary | ICD-10-CM

## 2022-07-17 DIAGNOSIS — J45909 Unspecified asthma, uncomplicated: Secondary | ICD-10-CM | POA: Diagnosis not present

## 2022-07-17 DIAGNOSIS — J4520 Mild intermittent asthma, uncomplicated: Secondary | ICD-10-CM

## 2022-07-17 DIAGNOSIS — G894 Chronic pain syndrome: Secondary | ICD-10-CM | POA: Diagnosis not present

## 2022-07-17 DIAGNOSIS — K219 Gastro-esophageal reflux disease without esophagitis: Secondary | ICD-10-CM | POA: Diagnosis not present

## 2022-07-17 DIAGNOSIS — Z720 Tobacco use: Secondary | ICD-10-CM | POA: Diagnosis not present

## 2022-07-17 DIAGNOSIS — M5430 Sciatica, unspecified side: Secondary | ICD-10-CM | POA: Diagnosis not present

## 2022-07-17 MED ORDER — GABAPENTIN 300 MG PO CAPS: 300.0000 mg | ORAL_CAPSULE | Freq: Two times a day (BID) | ORAL | 3 refills | Status: AC

## 2022-07-17 MED ORDER — TIZANIDINE HCL 4 MG PO TABS: 4.0000 mg | ORAL_TABLET | Freq: Four times a day (QID) | ORAL | 0 refills | Status: DC | PRN

## 2022-07-17 MED ORDER — ACETAMINOPHEN ER 650 MG PO TBCR: 650.0000 mg | EXTENDED_RELEASE_TABLET | Freq: Three times a day (TID) | ORAL | 2 refills | Status: DC | PRN

## 2022-07-17 MED ORDER — ALBUTEROL SULFATE HFA 108 (90 BASE) MCG/ACT IN AERS
2.0000 | INHALATION_SPRAY | Freq: Four times a day (QID) | RESPIRATORY_TRACT | 1 refills | Status: DC | PRN
Start: 2022-07-17 — End: 2022-09-05

## 2022-07-17 MED ORDER — OXYCODONE HCL 30 MG PO TABS
30.0000 mg | ORAL_TABLET | Freq: Four times a day (QID) | ORAL | 0 refills | Status: DC | PRN
Start: 2022-07-17 — End: 2022-07-31

## 2022-07-17 NOTE — Patient Instructions (Signed)
1. Mild intermittent asthma without complication  - albuterol (VENTOLIN HFA) 108 (90 Base) MCG/ACT inhaler; Inhale 2 puffs into the lungs every 6 (six) hours as needed for wheezing or shortness of breath.  Dispense: 8 g; Refill: 1  2. Pneumonia due to infectious organism, unspecified laterality, unspecified part of lung  - albuterol (VENTOLIN HFA) 108 (90 Base) MCG/ACT inhaler; Inhale 2 puffs into the lungs every 6 (six) hours as needed for wheezing or shortness of breath.  Dispense: 8 g; Refill: 1 - DG Chest 2 View; Future - CBC - Comprehensive metabolic panel  3. Chronic pain syndrome  - oxycodone (ROXICODONE) 30 MG immediate release tablet; Take 1 tablet (30 mg total) by mouth every 6 (six) hours as needed for pain.  Dispense: 60 tablet; Refill: 0  4. Sickle-cell/Hb-C disease with crisis, unspecified (HCC)  - oxycodone (ROXICODONE) 30 MG immediate release tablet; Take 1 tablet (30 mg total) by mouth every 6 (six) hours as needed for pain.  Dispense: 60 tablet; Refill: 0  Follow up:  Follow up in 1 month

## 2022-07-17 NOTE — Progress Notes (Signed)
@Patient  ID: Dean Webb, male    DOB: 02-Feb-1976, 47 y.o.   MRN: [id number]  Chief Complaint  Patient presents with   Sickle Cell Anemia    Follow up    Referring provider: Ivonne Andrew, NP   HPI   Patient presents today for sickle cell follow-up.  He was recently hospitalized for sickle cell crisis and pneumonia.  We discussed that he will need a follow-up x-ray for pneumonia next Friday.  Patient states that he is having GI upset from Motrin.  We will stop this and trial gabapentin.  Ordered Tylenol as well.  Patient does need a refill today on oxycodone.  Denies f/c/s, n/v/d, hemoptysis, PND, leg swelling. Denies chest pain or edema.       No Known Allergies  Immunization History  Administered Date(s) Administered   Influenza,inj,Quad PF,6+ Mos 03/09/2016, 10/25/2017, 01/17/2021, 01/12/2022   Pneumococcal Polysaccharide-23 03/09/2016   Tdap 08/03/2017    Past Medical History:  Diagnosis Date   Asthma    GERD (gastroesophageal reflux disease)    Hb-S/Hb-C disease (HCC) 1979   HTN (hypertension) 2008   Hypertension    Sickle cell anemia (HCC)    Sickle cell disease (HCC)     Tobacco History: Social History   Tobacco Use  Smoking Status Every Day   Packs/day: 0.50   Years: 17.00   Additional pack years: 0.00   Total pack years: 8.50   Types: Cigarettes  Smokeless Tobacco Never   Ready to quit: Not Answered Counseling given: Not Answered   Outpatient Encounter Medications as of 07/17/2022  Medication Sig   acetaminophen (TYLENOL 8 HOUR) 650 MG CR tablet Take 1 tablet (650 mg total) by mouth every 8 (eight) hours as needed for pain.   amLODipine (NORVASC) 10 MG tablet Take 1 tablet (10 mg total) by mouth daily.   cetirizine (ZYRTEC) 10 MG tablet TAKE 1 TABLET BY MOUTH EVERY DAY AS NEEDED FOR ALLERGY (Patient taking differently: Take 10 mg by mouth daily as needed for allergies.)   cloNIDine (CATAPRES) 0.1 MG tablet TAKE 1 TABLET BY MOUTH 2 TIMES  DAILY.   DULoxetine (CYMBALTA) 30 MG capsule Take 1 capsule (30 mg total) by mouth daily.   gabapentin (NEURONTIN) 300 MG capsule Take 1 capsule (300 mg total) by mouth 2 (two) times daily.   losartan-hydrochlorothiazide (HYZAAR) 100-25 MG tablet Take 1 tablet by mouth daily.   metoprolol succinate (TOPROL-XL) 50 MG 24 hr tablet Take 1 tablet (50 mg total) by mouth daily. Take with or immediately following a meal.   montelukast (SINGULAIR) 10 MG tablet TAKE 1 TABLET BY MOUTH EVERYDAY AT BEDTIME (Patient taking differently: Take 10 mg by mouth at bedtime.)   pantoprazole (PROTONIX) 40 MG tablet Take 1 tablet (40 mg total) by mouth daily. (Patient taking differently: Take 40 mg by mouth See admin instructions. Take 40 mg by mouth at least 30 minutes before a meal- up to once a day)   [DISCONTINUED] albuterol (VENTOLIN HFA) 108 (90 Base) MCG/ACT inhaler Inhale 2 puffs into the lungs every 6 (six) hours as needed for wheezing or shortness of breath. (Patient taking differently: Inhale 3 puffs into the lungs every 6 (six) hours as needed for wheezing or shortness of breath.)   [DISCONTINUED] ibuprofen (ADVIL) 800 MG tablet Take 1 tablet (800 mg total) by mouth every 8 (eight) hours as needed. (Patient taking differently: Take 800 mg by mouth every 8 (eight) hours as needed for mild pain or headache.)   [  DISCONTINUED] oxycodone (ROXICODONE) 30 MG immediate release tablet Take 1 tablet (30 mg total) by mouth every 6 (six) hours as needed for pain.   [DISCONTINUED] tiZANidine (ZANAFLEX) 4 MG tablet Take 1 tablet (4 mg total) by mouth every 6 (six) hours as needed for muscle spasms.   albuterol (VENTOLIN HFA) 108 (90 Base) MCG/ACT inhaler Inhale 2 puffs into the lungs every 6 (six) hours as needed for wheezing or shortness of breath.   amoxicillin-clavulanate (AUGMENTIN) 875-125 MG tablet Take 1 tablet by mouth 2 (two) times daily. (Patient not taking: Reported on 07/17/2022)   oxycodone (ROXICODONE) 30 MG  immediate release tablet Take 1 tablet (30 mg total) by mouth every 6 (six) hours as needed for pain.   tiZANidine (ZANAFLEX) 4 MG tablet Take 1 tablet (4 mg total) by mouth every 6 (six) hours as needed for muscle spasms.   Vitamin D, Ergocalciferol, (DRISDOL) 1.25 MG (50000 UNIT) CAPS capsule Take 1 capsule (50,000 Units total) by mouth every 7 (seven) days. (Patient not taking: Reported on 06/28/2022)   Facility-Administered Encounter Medications as of 07/17/2022  Medication   cloNIDine (CATAPRES) tablet 0.3 mg     Review of Systems  Review of Systems  Constitutional: Negative.   HENT: Negative.    Cardiovascular: Negative.   Gastrointestinal: Negative.   Allergic/Immunologic: Negative.   Neurological: Negative.   Psychiatric/Behavioral: Negative.         Physical Exam  BP 136/78   Pulse 96   Temp (!) 97.2 F (36.2 C)   Ht 5\' 8"  (1.727 m)   Wt 211 lb 12.8 oz (96.1 kg)   SpO2 100%   BMI 32.20 kg/m   Wt Readings from Last 5 Encounters:  07/17/22 211 lb 12.8 oz (96.1 kg)  06/28/22 208 lb 3.2 oz (94.4 kg)  06/27/22 208 lb 3.2 oz (94.4 kg)  04/17/22 208 lb 3.2 oz (94.4 kg)  01/12/22 202 lb 3.2 oz (91.7 kg)     Physical Exam Vitals and nursing note reviewed.  Constitutional:      General: He is not in acute distress.    Appearance: He is well-developed.  Cardiovascular:     Rate and Rhythm: Normal rate and regular rhythm.  Pulmonary:     Effort: Pulmonary effort is normal.     Breath sounds: Normal breath sounds.  Skin:    General: Skin is warm and dry.  Neurological:     Mental Status: He is alert and oriented to person, place, and time.      Lab Results:  CBC    Component Value Date/Time   WBC 12.5 (H) 06/29/2022 0951   RBC 4.15 (L) 06/29/2022 0951   HGB 11.7 (L) 06/29/2022 0951   HGB 13.6 04/17/2022 1529   HCT 31.7 (L) 06/29/2022 0951   HCT 39.8 04/17/2022 1529   PLT 430 (H) 06/29/2022 0951   PLT 375 04/17/2022 1529   MCV 76.4 (L) 06/29/2022  0951   MCV 85 04/17/2022 1529   MCH 28.2 06/29/2022 0951   MCHC 36.9 (H) 06/29/2022 0951   RDW 16.7 (H) 06/29/2022 0951   RDW 16.3 (H) 04/17/2022 1529   LYMPHSABS 2.5 06/29/2022 0951   LYMPHSABS 3.9 (H) 04/17/2022 1529   MONOABS 2.0 (H) 06/29/2022 0951   EOSABS 0.4 06/29/2022 0951   EOSABS 0.5 (H) 04/17/2022 1529   BASOSABS 0.1 06/29/2022 0951   BASOSABS 0.1 04/17/2022 1529    BMET    Component Value Date/Time   NA 133 (L) 06/29/2022 1610  NA 136 04/17/2022 1529   K 3.6 06/29/2022 0951   CL 102 06/29/2022 0951   CO2 26 06/29/2022 0951   GLUCOSE 92 06/29/2022 0951   BUN 9 06/29/2022 0951   BUN 9 04/17/2022 1529   CREATININE 1.02 06/29/2022 0951   CALCIUM 9.8 06/29/2022 0951   GFRNONAA >60 06/29/2022 0951   GFRAA 89 03/22/2020 1226     Assessment & Plan:   Mild intermittent asthma without complication - albuterol (VENTOLIN HFA) 108 (90 Base) MCG/ACT inhaler; Inhale 2 puffs into the lungs every 6 (six) hours as needed for wheezing or shortness of breath.  Dispense: 8 g; Refill: 1  2. Pneumonia due to infectious organism, unspecified laterality, unspecified part of lung  - albuterol (VENTOLIN HFA) 108 (90 Base) MCG/ACT inhaler; Inhale 2 puffs into the lungs every 6 (six) hours as needed for wheezing or shortness of breath.  Dispense: 8 g; Refill: 1 - DG Chest 2 View; Future - CBC - Comprehensive metabolic panel  3. Chronic pain syndrome  - oxycodone (ROXICODONE) 30 MG immediate release tablet; Take 1 tablet (30 mg total) by mouth every 6 (six) hours as needed for pain.  Dispense: 60 tablet; Refill: 0  4. Sickle-cell/Hb-C disease with crisis, unspecified (HCC)  - oxycodone (ROXICODONE) 30 MG immediate release tablet; Take 1 tablet (30 mg total) by mouth every 6 (six) hours as needed for pain.  Dispense: 60 tablet; Refill: 0  Follow up:  Follow up in 1 month     Ivonne Andrew, NP 07/17/2022

## 2022-07-17 NOTE — Telephone Encounter (Signed)
Please advise Kh 

## 2022-07-17 NOTE — Assessment & Plan Note (Signed)
-   albuterol (VENTOLIN HFA) 108 (90 Base) MCG/ACT inhaler; Inhale 2 puffs into the lungs every 6 (six) hours as needed for wheezing or shortness of breath.  Dispense: 8 g; Refill: 1  2. Pneumonia due to infectious organism, unspecified laterality, unspecified part of lung  - albuterol (VENTOLIN HFA) 108 (90 Base) MCG/ACT inhaler; Inhale 2 puffs into the lungs every 6 (six) hours as needed for wheezing or shortness of breath.  Dispense: 8 g; Refill: 1 - DG Chest 2 View; Future - CBC - Comprehensive metabolic panel  3. Chronic pain syndrome  - oxycodone (ROXICODONE) 30 MG immediate release tablet; Take 1 tablet (30 mg total) by mouth every 6 (six) hours as needed for pain.  Dispense: 60 tablet; Refill: 0  4. Sickle-cell/Hb-C disease with crisis, unspecified (HCC)  - oxycodone (ROXICODONE) 30 MG immediate release tablet; Take 1 tablet (30 mg total) by mouth every 6 (six) hours as needed for pain.  Dispense: 60 tablet; Refill: 0  Follow up:  Follow up in 1 month

## 2022-07-18 LAB — CBC
Hematocrit: 38.3 % (ref 37.5–51.0)
Hemoglobin: 12.6 g/dL — ABNORMAL LOW (ref 13.0–17.7)
MCH: 27.8 pg (ref 26.6–33.0)
MCHC: 32.9 g/dL (ref 31.5–35.7)
MCV: 85 fL (ref 79–97)
NRBC: 2 % — ABNORMAL HIGH (ref 0–0)
Platelets: 406 10*3/uL (ref 150–450)
RBC: 4.53 x10E6/uL (ref 4.14–5.80)
RDW: 18.4 % — ABNORMAL HIGH (ref 11.6–15.4)
WBC: 9.1 10*3/uL (ref 3.4–10.8)

## 2022-07-18 LAB — COMPREHENSIVE METABOLIC PANEL
ALT: 20 IU/L (ref 0–44)
AST: 22 IU/L (ref 0–40)
Albumin/Globulin Ratio: 1.4 (ref 1.2–2.2)
Albumin: 4.2 g/dL (ref 4.1–5.1)
Alkaline Phosphatase: 122 IU/L — ABNORMAL HIGH (ref 44–121)
BUN/Creatinine Ratio: 6 — ABNORMAL LOW (ref 9–20)
BUN: 7 mg/dL (ref 6–24)
Bilirubin Total: 1 mg/dL (ref 0.0–1.2)
CO2: 23 mmol/L (ref 20–29)
Calcium: 11.1 mg/dL — ABNORMAL HIGH (ref 8.7–10.2)
Chloride: 103 mmol/L (ref 96–106)
Creatinine, Ser: 1.15 mg/dL (ref 0.76–1.27)
Globulin, Total: 3 g/dL (ref 1.5–4.5)
Glucose: 106 mg/dL — ABNORMAL HIGH (ref 70–99)
Potassium: 4.2 mmol/L (ref 3.5–5.2)
Sodium: 138 mmol/L (ref 134–144)
Total Protein: 7.2 g/dL (ref 6.0–8.5)
eGFR: 79 mL/min/{1.73_m2} (ref >59)

## 2022-07-31 ENCOUNTER — Other Ambulatory Visit: Payer: Self-pay

## 2022-07-31 DIAGNOSIS — G894 Chronic pain syndrome: Secondary | ICD-10-CM

## 2022-07-31 DIAGNOSIS — D57219 Sickle-cell/Hb-C disease with crisis, unspecified: Secondary | ICD-10-CM

## 2022-07-31 MED ORDER — OXYCODONE HCL 30 MG PO TABS
30.0000 mg | ORAL_TABLET | Freq: Four times a day (QID) | ORAL | 0 refills | Status: DC | PRN
Start: 2022-07-31 — End: 2022-08-14

## 2022-07-31 NOTE — Telephone Encounter (Signed)
Please advise KH 

## 2022-07-31 NOTE — Telephone Encounter (Signed)
From: Granville Lewis Cleek To: Office of Ivonne Andrew, NP Sent: 07/31/2022 3:35 PM EDT Subject: Medication Renewal Request  Refills have been requested for the following medications:   oxycodone (ROXICODONE) 30 MG immediate release tablet Archie Patten S Nichols]  Preferred pharmacy: [id number] [place], [place] - [address] Delivery method: [employer]

## 2022-08-14 ENCOUNTER — Other Ambulatory Visit: Payer: Self-pay | Admitting: Nurse Practitioner

## 2022-08-14 ENCOUNTER — Other Ambulatory Visit: Payer: Self-pay

## 2022-08-14 DIAGNOSIS — G894 Chronic pain syndrome: Secondary | ICD-10-CM

## 2022-08-14 DIAGNOSIS — D57219 Sickle-cell/Hb-C disease with crisis, unspecified: Secondary | ICD-10-CM

## 2022-08-14 MED ORDER — TIZANIDINE HCL 4 MG PO TABS: 4.0000 mg | ORAL_TABLET | Freq: Four times a day (QID) | ORAL | 0 refills | Status: DC | PRN

## 2022-08-14 MED ORDER — OXYCODONE HCL 30 MG PO TABS
30.0000 mg | ORAL_TABLET | Freq: Four times a day (QID) | ORAL | 0 refills | Status: DC | PRN
Start: 2022-08-14 — End: 2022-08-27

## 2022-08-14 NOTE — Telephone Encounter (Signed)
Please advise Kh 

## 2022-08-14 NOTE — Telephone Encounter (Signed)
From: Granville Lewis Brosnahan To: Office of Ivonne Andrew, NP Sent: 08/14/2022 8:41 AM EDT Subject: Medication Renewal Request  Refills have been requested for the following medications:   tiZANidine (ZANAFLEX) 4 MG tablet Dean Webb]  Preferred pharmacy: [id number] [place], [place] - [address] Delivery method: [employer]

## 2022-08-23 ENCOUNTER — Observation Stay (HOSPITAL_COMMUNITY)
Admission: EM | Admit: 2022-08-23 | Discharge: 2022-08-25 | Disposition: A | Discharge status: Home / Self Care | Payer: 59 | Attending: Internal Medicine | Admitting: Internal Medicine

## 2022-08-23 ENCOUNTER — Emergency Department (HOSPITAL_COMMUNITY): Payer: 59

## 2022-08-23 ENCOUNTER — Encounter (HOSPITAL_COMMUNITY): Payer: Self-pay

## 2022-08-23 DIAGNOSIS — Z79899 Other long term (current) drug therapy: Secondary | ICD-10-CM | POA: Diagnosis not present

## 2022-08-23 DIAGNOSIS — Z79891 Long term (current) use of opiate analgesic: Secondary | ICD-10-CM

## 2022-08-23 DIAGNOSIS — E860 Dehydration: Secondary | ICD-10-CM | POA: Diagnosis not present

## 2022-08-23 DIAGNOSIS — I10 Essential (primary) hypertension: Secondary | ICD-10-CM | POA: Diagnosis not present

## 2022-08-23 DIAGNOSIS — F1910 Other psychoactive substance abuse, uncomplicated: Secondary | ICD-10-CM | POA: Insufficient documentation

## 2022-08-23 DIAGNOSIS — D571 Sickle-cell disease without crisis: Secondary | ICD-10-CM | POA: Diagnosis not present

## 2022-08-23 DIAGNOSIS — K219 Gastro-esophageal reflux disease without esophagitis: Secondary | ICD-10-CM | POA: Diagnosis present

## 2022-08-23 DIAGNOSIS — Z8249 Family history of ischemic heart disease and other diseases of the circulatory system: Secondary | ICD-10-CM | POA: Diagnosis not present

## 2022-08-23 DIAGNOSIS — E872 Acidosis, unspecified: Secondary | ICD-10-CM | POA: Diagnosis not present

## 2022-08-23 DIAGNOSIS — R0602 Shortness of breath: Secondary | ICD-10-CM | POA: Diagnosis present

## 2022-08-23 DIAGNOSIS — G894 Chronic pain syndrome: Secondary | ICD-10-CM | POA: Diagnosis present

## 2022-08-23 DIAGNOSIS — T675XXA Heat exhaustion, unspecified, initial encounter: Principal | ICD-10-CM

## 2022-08-23 DIAGNOSIS — M6282 Rhabdomyolysis: Secondary | ICD-10-CM | POA: Diagnosis not present

## 2022-08-23 DIAGNOSIS — D5700 Hb-SS disease with crisis, unspecified: Secondary | ICD-10-CM | POA: Diagnosis not present

## 2022-08-23 DIAGNOSIS — D72829 Elevated white blood cell count, unspecified: Secondary | ICD-10-CM | POA: Diagnosis not present

## 2022-08-23 DIAGNOSIS — J45909 Unspecified asthma, uncomplicated: Secondary | ICD-10-CM | POA: Diagnosis not present

## 2022-08-23 DIAGNOSIS — Z833 Family history of diabetes mellitus: Secondary | ICD-10-CM | POA: Diagnosis not present

## 2022-08-23 DIAGNOSIS — Z832 Family history of diseases of the blood and blood-forming organs and certain disorders involving the immune mechanism: Secondary | ICD-10-CM | POA: Diagnosis not present

## 2022-08-23 DIAGNOSIS — F1410 Cocaine abuse, uncomplicated: Secondary | ICD-10-CM | POA: Diagnosis present

## 2022-08-23 DIAGNOSIS — N179 Acute kidney failure, unspecified: Secondary | ICD-10-CM | POA: Insufficient documentation

## 2022-08-23 DIAGNOSIS — F17210 Nicotine dependence, cigarettes, uncomplicated: Secondary | ICD-10-CM | POA: Diagnosis not present

## 2022-08-23 DIAGNOSIS — Z825 Family history of asthma and other chronic lower respiratory diseases: Secondary | ICD-10-CM | POA: Diagnosis not present

## 2022-08-23 DIAGNOSIS — I2489 Other forms of acute ischemic heart disease: Secondary | ICD-10-CM | POA: Diagnosis not present

## 2022-08-23 LAB — URINALYSIS, ROUTINE W REFLEX MICROSCOPIC
Bilirubin Urine: NEGATIVE
Glucose, UA: NEGATIVE mg/dL
Ketones, ur: NEGATIVE mg/dL
Leukocytes,Ua: NEGATIVE
Nitrite: NEGATIVE
Protein, ur: 100 mg/dL — AB
Specific Gravity, Urine: 1.012 (ref 1.005–1.030)
pH: 5 (ref 5.0–8.0)

## 2022-08-23 LAB — CBC WITH DIFFERENTIAL/PLATELET
Abs Immature Granulocytes: 0.25 10*3/uL — ABNORMAL HIGH (ref 0.00–0.07)
Basophils Absolute: 0.1 10*3/uL (ref 0.0–0.1)
Basophils Relative: 0 %
Eosinophils Absolute: 0.2 10*3/uL (ref 0.0–0.5)
Eosinophils Relative: 1 %
HCT: 38.4 % — ABNORMAL LOW (ref 39.0–52.0)
Hemoglobin: 14 g/dL (ref 13.0–17.0)
Immature Granulocytes: 2 %
Lymphocytes Relative: 41 %
Lymphs Abs: 6 10*3/uL — ABNORMAL HIGH (ref 0.7–4.0)
MCH: 29.2 pg (ref 26.0–34.0)
MCHC: 36.5 g/dL — ABNORMAL HIGH (ref 30.0–36.0)
MCV: 80.2 fL (ref 80.0–100.0)
Monocytes Absolute: 1.1 10*3/uL — ABNORMAL HIGH (ref 0.1–1.0)
Monocytes Relative: 7 %
Neutro Abs: 7.1 10*3/uL (ref 1.7–7.7)
Neutrophils Relative %: 49 %
Platelets: 199 10*3/uL (ref 150–400)
RBC: 4.79 MIL/uL (ref 4.22–5.81)
RDW: 16.3 % — ABNORMAL HIGH (ref 11.5–15.5)
WBC: 14.7 10*3/uL — ABNORMAL HIGH (ref 4.0–10.5)
nRBC: 1.1 % — ABNORMAL HIGH (ref 0.0–0.2)

## 2022-08-23 LAB — BASIC METABOLIC PANEL
Anion gap: 15 (ref 5–15)
BUN: 11 mg/dL (ref 6–20)
CO2: 15 mmol/L — ABNORMAL LOW (ref 22–32)
Calcium: 9.2 mg/dL (ref 8.9–10.3)
Chloride: 107 mmol/L (ref 98–111)
Creatinine, Ser: 1.34 mg/dL — ABNORMAL HIGH (ref 0.61–1.24)
GFR, Estimated: >60 mL/min (ref >60)
Glucose, Bld: 81 mg/dL (ref 70–99)
Potassium: 4.2 mmol/L (ref 3.5–5.1)
Sodium: 137 mmol/L (ref 135–145)

## 2022-08-23 LAB — CK: Total CK: 19281 U/L — ABNORMAL HIGH (ref 49–397)

## 2022-08-23 LAB — COMPREHENSIVE METABOLIC PANEL
ALT: 32 U/L (ref 0–44)
AST: 65 U/L — ABNORMAL HIGH (ref 15–41)
Albumin: 4.3 g/dL (ref 3.5–5.0)
Alkaline Phosphatase: 89 U/L (ref 38–126)
Anion gap: 25 — ABNORMAL HIGH (ref 5–15)
BUN: 7 mg/dL (ref 6–20)
CO2: 9 mmol/L — ABNORMAL LOW (ref 22–32)
Calcium: 10.5 mg/dL — ABNORMAL HIGH (ref 8.9–10.3)
Chloride: 105 mmol/L (ref 98–111)
Creatinine, Ser: 1.73 mg/dL — ABNORMAL HIGH (ref 0.61–1.24)
GFR, Estimated: 49 mL/min — ABNORMAL LOW (ref >60)
Glucose, Bld: 187 mg/dL — ABNORMAL HIGH (ref 70–99)
Potassium: 4.6 mmol/L (ref 3.5–5.1)
Sodium: 139 mmol/L (ref 135–145)
Total Bilirubin: 1.9 mg/dL — ABNORMAL HIGH (ref 0.3–1.2)
Total Protein: 7.8 g/dL (ref 6.5–8.1)

## 2022-08-23 LAB — RAPID URINE DRUG SCREEN, HOSP PERFORMED
Amphetamines: NOT DETECTED
Barbiturates: NOT DETECTED
Benzodiazepines: NOT DETECTED
Cocaine: POSITIVE — AB
Opiates: POSITIVE — AB
Tetrahydrocannabinol: NOT DETECTED

## 2022-08-23 LAB — TROPONIN I (HIGH SENSITIVITY)
Troponin I (High Sensitivity): 49 ng/L — ABNORMAL HIGH (ref <18)
Troponin I (High Sensitivity): 48 ng/L — ABNORMAL HIGH (ref <18)

## 2022-08-23 LAB — CBG MONITORING, ED: Glucose-Capillary: 174 mg/dL — ABNORMAL HIGH (ref 70–99)

## 2022-08-23 MED ORDER — POLYETHYLENE GLYCOL 3350 17 G PO PACK: 17.0000 g | PACK | Freq: Every day | ORAL | Status: DC | PRN

## 2022-08-23 MED ORDER — LACTATED RINGERS IV SOLN: INTRAVENOUS | Status: DC

## 2022-08-23 MED ORDER — ACETAMINOPHEN 325 MG PO TABS: 650.0000 mg | ORAL_TABLET | Freq: Four times a day (QID) | ORAL | Status: DC | PRN

## 2022-08-23 MED ORDER — MORPHINE SULFATE (PF) 4 MG/ML IV SOLN
8.0000 mg | Freq: Once | INTRAVENOUS | Status: AC
  Administered 2022-08-23: 8 mg via INTRAVENOUS
  Filled 2022-08-23: qty 2

## 2022-08-23 MED ORDER — PROCHLORPERAZINE EDISYLATE 10 MG/2ML IJ SOLN
5.0000 mg | Freq: Four times a day (QID) | INTRAMUSCULAR | Status: DC | PRN
  Administered 2022-08-24: 5 mg via INTRAVENOUS
  Filled 2022-08-23: qty 2

## 2022-08-23 MED ORDER — ONDANSETRON HCL 4 MG/2ML IJ SOLN
4.0000 mg | Freq: Once | INTRAMUSCULAR | Status: AC
  Administered 2022-08-23: 4 mg via INTRAVENOUS
  Filled 2022-08-23: qty 2

## 2022-08-23 MED ORDER — MORPHINE SULFATE (PF) 4 MG/ML IV SOLN
4.0000 mg | Freq: Once | INTRAVENOUS | Status: AC
  Administered 2022-08-23: 4 mg via INTRAVENOUS
  Filled 2022-08-23: qty 1

## 2022-08-23 MED ORDER — ENOXAPARIN SODIUM 60 MG/0.6ML IJ SOSY
50.0000 mg | PREFILLED_SYRINGE | INTRAMUSCULAR | Status: DC
  Administered 2022-08-24 – 2022-08-25 (×2): 50 mg via SUBCUTANEOUS
  Filled 2022-08-23 (×2): qty 0.6

## 2022-08-23 MED ORDER — AMLODIPINE BESYLATE 10 MG PO TABS
10.0000 mg | ORAL_TABLET | Freq: Every day | ORAL | Status: DC
  Administered 2022-08-23 – 2022-08-25 (×3): 10 mg via ORAL
  Filled 2022-08-23: qty 2
  Filled 2022-08-23 (×2): qty 1

## 2022-08-23 MED ORDER — SODIUM CHLORIDE 0.9 % IV BOLUS
1000.0000 mL | Freq: Once | INTRAVENOUS | Status: AC
  Administered 2022-08-23: 1000 mL via INTRAVENOUS

## 2022-08-23 MED ORDER — PANTOPRAZOLE SODIUM 40 MG PO TBEC
40.0000 mg | DELAYED_RELEASE_TABLET | Freq: Every day | ORAL | Status: DC
  Administered 2022-08-24 – 2022-08-25 (×2): 40 mg via ORAL
  Filled 2022-08-23 (×2): qty 1

## 2022-08-23 MED ORDER — DULOXETINE HCL 30 MG PO CPEP
30.0000 mg | ORAL_CAPSULE | Freq: Every day | ORAL | Status: DC
  Administered 2022-08-24 – 2022-08-25 (×2): 30 mg via ORAL
  Filled 2022-08-23 (×2): qty 1

## 2022-08-23 MED ORDER — HYDRALAZINE HCL 20 MG/ML IJ SOLN
5.0000 mg | Freq: Four times a day (QID) | INTRAMUSCULAR | Status: DC | PRN
  Administered 2022-08-24: 5 mg via INTRAVENOUS
  Filled 2022-08-23: qty 1

## 2022-08-23 MED ORDER — IPRATROPIUM-ALBUTEROL 0.5-2.5 (3) MG/3ML IN SOLN
3.0000 mL | Freq: Once | RESPIRATORY_TRACT | Status: AC
  Administered 2022-08-23: 3 mL via RESPIRATORY_TRACT
  Filled 2022-08-23: qty 3

## 2022-08-23 MED ORDER — MELATONIN 5 MG PO TABS: 5.0000 mg | ORAL_TABLET | Freq: Every evening | ORAL | Status: DC | PRN

## 2022-08-23 MED ORDER — MONTELUKAST SODIUM 10 MG PO TABS
10.0000 mg | ORAL_TABLET | Freq: Every day | ORAL | Status: DC
  Administered 2022-08-23 – 2022-08-24 (×2): 10 mg via ORAL
  Filled 2022-08-23 (×2): qty 1

## 2022-08-23 MED ORDER — KETOROLAC TROMETHAMINE 30 MG/ML IJ SOLN
30.0000 mg | Freq: Once | INTRAMUSCULAR | Status: AC
  Administered 2022-08-23: 30 mg via INTRAVENOUS
  Filled 2022-08-23: qty 1

## 2022-08-23 NOTE — H&P (Addendum)
History and Physical  Dean Webb ZOX:[id number] DOB: 02-Nov-1975 DOA: 08/23/2022  Referring physician: Dr. Adela Lank, EDP  PCP: Ivonne Andrew, NP  Outpatient Specialists: None Patient coming from: Running from the police in [place], [place].  Chief Complaint: Short of breath and diaphoretic while running in 96 degrees heat from the police.  HPI: Dean Webb is a 47 y.o. male with medical history significant for essential hypertension, GERD, chronic anxiety/depression, asthma, sickle cell disease, chronic pain syndrome, on chronic opioids who was brought in by [employer].  The patient was running from the police in [place] and was complaining of shortness of breath and sweatiness.  Due to concern for possible heatstroke he was brought into the ED for further evaluation.  In the ED, vital signs notable for tachycardia and tachypnea.  No hypoxia with O2 saturation of 96% on room air.  No active disease seen on chest x-ray.  Lab studies notable for high anion gap metabolic acidosis with serum bicarb of 9 and anion gap of 25.  After receiving 3 L IV fluid boluses, CPK 19,281.  Troponin 49, repeat flat 48.  EDP requested admission for management of rhabdomyolysis.  Admitted by [facility], hospitalist service, to MedSurg unit as inpatient status.  ED Course: Temperature 98.  BP 151/119, pulse 98, respiration rate 22, O2 saturation 96% on room air.  Lab studies notable for CPK 19,281, high-sensitivity troponin 49, repeat 48.  Creatinine 1.73 with GFR 49, repeat after IV fluid hydration creatinine 1.34 with GFR greater than 60.  Serum bicarb 9 repeat after IV fluid hydration 15.  WBC 14.7.  Review of Systems: Review of systems as noted in the HPI. All other systems reviewed and are negative.   Past Medical History:  Diagnosis Date   Asthma    GERD (gastroesophageal reflux disease)    Hb-S/Hb-C disease (HCC) 1979   HTN (hypertension) 2008   Hypertension    Sickle cell anemia  (HCC)    Sickle cell disease (HCC)    Past Surgical History:  Procedure Laterality Date   CHOLECYSTECTOMY      Social History:  reports that he has been smoking cigarettes. He has a 8.50 pack-year smoking history. He has never used smokeless tobacco. He reports current alcohol use. He reports that he does not currently use drugs after having used the following drugs: Marijuana.   No Known Allergies  Family History  Problem Relation Age of Onset   Sickle cell trait Mother    Hypertension Mother    Asthma Mother    Sickle cell trait Father    Congestive Heart Failure Father    Hypertension Sister    Sickle cell anemia Daughter    Hypertension Daughter    Diabetes Daughter       Prior to Admission medications   Medication Sig Start Date End Date Taking? Authorizing Provider  acetaminophen (TYLENOL 8 HOUR) 650 MG CR tablet Take 1 tablet (650 mg total) by mouth every 8 (eight) hours as needed for pain. 07/17/22   Ivonne Andrew, NP  albuterol (VENTOLIN HFA) 108 (90 Base) MCG/ACT inhaler Inhale 2 puffs into the lungs every 6 (six) hours as needed for wheezing or shortness of breath. 07/17/22 10/15/22  Ivonne Andrew, NP  amLODipine (NORVASC) 10 MG tablet Take 1 tablet (10 mg total) by mouth daily. 04/17/22   Ivonne Andrew, NP  amoxicillin-clavulanate (AUGMENTIN) 875-125 MG tablet Take 1 tablet by mouth 2 (two) times daily. Patient not taking: Reported on  07/17/2022 06/30/22   Massie Maroon, FNP  cetirizine (ZYRTEC) 10 MG tablet TAKE 1 TABLET BY MOUTH EVERY DAY AS NEEDED FOR ALLERGY Patient taking differently: Take 10 mg by mouth daily as needed for allergies. 04/17/22   Ivonne Andrew, NP  cloNIDine (CATAPRES) 0.1 MG tablet TAKE 1 TABLET BY MOUTH 2 TIMES DAILY. 07/17/22   Ivonne Andrew, NP  DULoxetine (CYMBALTA) 30 MG capsule Take 1 capsule (30 mg total) by mouth daily. 04/17/22   Ivonne Andrew, NP  gabapentin (NEURONTIN) 300 MG capsule Take 1 capsule (300 mg total) by mouth 2 (two)  times daily. 07/17/22   Ivonne Andrew, NP  losartan-hydrochlorothiazide (HYZAAR) 100-25 MG tablet Take 1 tablet by mouth daily. 04/17/22   Ivonne Andrew, NP  metoprolol succinate (TOPROL-XL) 50 MG 24 hr tablet Take 1 tablet (50 mg total) by mouth daily. Take with or immediately following a meal. 04/17/22   Ivonne Andrew, NP  montelukast (SINGULAIR) 10 MG tablet TAKE 1 TABLET BY MOUTH EVERYDAY AT BEDTIME Patient taking differently: Take 10 mg by mouth at bedtime. 04/17/22   Ivonne Andrew, NP  oxycodone (ROXICODONE) 30 MG immediate release tablet Take 1 tablet (30 mg total) by mouth every 6 (six) hours as needed for pain. 08/14/22   Ivonne Andrew, NP  pantoprazole (PROTONIX) 40 MG tablet Take 1 tablet (40 mg total) by mouth daily. Patient taking differently: Take 40 mg by mouth See admin instructions. Take 40 mg by mouth at least 30 minutes before a meal- up to once a day 04/17/22   Ivonne Andrew, NP  tiZANidine (ZANAFLEX) 4 MG tablet Take 1 tablet (4 mg total) by mouth every 6 (six) hours as needed for muscle spasms. 08/14/22   Ivonne Andrew, NP  Vitamin D, Ergocalciferol, (DRISDOL) 1.25 MG (50000 UNIT) CAPS capsule Take 1 capsule (50,000 Units total) by mouth every 7 (seven) days. Patient not taking: Reported on 06/28/2022 01/13/22   Ivonne Andrew, NP    Physical Exam: BP (!) 156/111 (BP Location: Right Arm)   Pulse 82   Temp 98 F (36.7 C) (Oral)   Resp 20   Ht 5\' 8"  (1.727 m)   Wt 96.1 kg   SpO2 94%   BMI 32.21 kg/m   General: 47 y.o. year-old male well developed well nourished in no acute distress.  Alert and oriented x3. Cardiovascular: Regular rate and rhythm with no rubs or gallops.  No thyromegaly or JVD noted.  No lower extremity edema. 2/4 pulses in all 4 extremities. Respiratory: Clear to auscultation with no wheezes or rales. Good inspiratory effort. Abdomen: Soft nontender nondistended with normal bowel sounds x4 quadrants. Muskuloskeletal: No cyanosis, clubbing or  edema noted bilaterally Neuro: CN II-XII intact, strength, sensation, reflexes Skin: No ulcerative lesions noted or rashes Psychiatry: Judgement and insight appear normal. Mood is appropriate for condition and setting          Labs on Admission:  Basic Metabolic Panel: Recent Labs  Lab 08/23/22 1314 08/23/22 1910  NA 139 137  K 4.6 4.2  CL 105 107  CO2 9* 15*  GLUCOSE 187* 81  BUN 7 11  CREATININE 1.73* 1.34*  CALCIUM 10.5* 9.2   Liver Function Tests: Recent Labs  Lab 08/23/22 1314  AST 65*  ALT 32  ALKPHOS 89  BILITOT 1.9*  PROT 7.8  ALBUMIN 4.3   No results for input(s): "LIPASE", "AMYLASE" in the last 168 hours. No results for input(s): "AMMONIA"  in the last 168 hours. CBC: Recent Labs  Lab 08/23/22 1314  WBC 14.7*  NEUTROABS 7.1  HGB 14.0  HCT 38.4*  MCV 80.2  PLT 199   Cardiac Enzymes: Recent Labs  Lab 08/23/22 1910  CKTOTAL 19,281*    BNP (last 3 results) No results for input(s): "BNP" in the last 8760 hours.  ProBNP (last 3 results) No results for input(s): "PROBNP" in the last 8760 hours.  CBG: Recent Labs  Lab 08/23/22 1320  GLUCAP 174*    Radiological Exams on Admission: DG Chest Portable 1 View  Result Date: 08/23/2022 CLINICAL DATA:  sob EXAM: PORTABLE CHEST 1 VIEW COMPARISON:  06/27/2022. FINDINGS: Probable atelectatic changes noted at the left lung base. Bilateral lungs are otherwise clear. No acute consolidation or major lung collapse. Bilateral costophrenic angles are clear. Stable cardio-mediastinal silhouette. No acute osseous abnormalities. The soft tissues are within normal limits. IMPRESSION: No active disease. Electronically Signed   By: Jules Schick M.D.   On: 08/23/2022 14:57    EKG: I independently viewed the EKG done and my findings are as followed: Sinus tachycardia rate of 112.  Nonspecific ST-T changes.  QTc 469.  Assessment/Plan Present on Admission:  Rhabdomyolysis  Principal Problem:    Rhabdomyolysis  Rhabdomyolysis CPK greater than 19,000 Continue IV fluid hydration Monitor urine output and renal function Repeat CPK in the morning Pain control as needed with bowel regimen  AKI likely prerenal in the setting of dehydration Presented with creatinine 1.73 with GFR 49 Repeated creatinine after IV fluid hydration 1.34 with GFR greater than 60. Continue IV fluid Avoid nephrotoxic agents, dehydration and hypotension Repeat renal panel in the morning.  High anion gap metabolic acidosis in the setting of acute renal insufficiency Presented with serum bicarb of 9 with anion gap of 25 Improved with IV fluid hydration repeated serum bicarb of 15 and anion gap of 15. Continue IV fluid hydration Repeat chemistry panel in the morning.  Leukocytosis, suspect reactive in the setting of rhabdomyolysis Presented with WBC of 14.7 Afebrile and nontoxic-appearing Chest x-ray and UA unremarkable for active infective process Monitor off antibiotics Repeat CBC in the morning.  Elevated liver chemistries likely from polysubstance use Last alcohol intake was the day prior to presentation, had 2 shots of liquor, states he does not drink regularly. Elevated AST 65, ALT 32 with 2:1 AST:ALT ratio T bilirubin 1.9. Monitor and repeat CMP in the morning  Elevated troponin likely demand ischemia in the setting of rhabdomyolysis, AKI Presented with high-sensitivity troponin 49, repeat troponin flat 48 No evidence of acute ischemia on twelve-lead EKG  Hypertension, BP is not at goal, elevated Resume home Norvasc Resume home clonidine to avoid rebound hypertension. IV hydralazine as needed with parameters. Continue to monitor vital signs  Sickle cell disease Chronic pain syndrome with chronic opioid use Restart home regimen Bowel regimen in place.  Polysubstance abuse including cocaine UDS positive for opiates and cocaine History of tobacco and alcohol use. TOC consulted to provide  resources for polysubstance cessation No evidence of alcohol withdrawal at the time of this visit.  The patient states he does not drink alcohol regularly.   Time: 75 minutes.   DVT prophylaxis: Subcu Lovenox daily  Code Status: Full code  Family Communication: None at bedside.  [place] police outside the room.  Disposition Plan: Admitted to MedSurg unit  Consults called: None  Admission status: Inpatient status.   Status is: Inpatient The patient requires at least 2 midnight for further evaluation and  treatment of present condition.   Darlin Drop MD [facility] Pager [phone]  If 7PM-7AM, please contact night-coverage [email or web address] Password [facility]  08/23/2022, 10:18 PM

## 2022-08-23 NOTE — ED Provider Notes (Signed)
[place] [facility]MENT AT [facility] Provider Note   CSN: [id number] Arrival date & time: 08/23/22  1302     History  Chief Complaint  Patient presents with   Heat Exposure    Dean Webb is a 47 y.o. male.  Pt is a 47 yo male with pmhx significant for SCD, HTN, and GERD.  Pt was running from the police today in the 95 degree heat.  He developed sob and was diaphoretic, so EMS was called.  Pt given 200 CC NS en route.  Pt still feels sob.       Home Medications Prior to Admission medications   Medication Sig Start Date End Date Taking? Authorizing Provider  acetaminophen (TYLENOL 8 HOUR) 650 MG CR tablet Take 1 tablet (650 mg total) by mouth every 8 (eight) hours as needed for pain. 07/17/22   Ivonne Andrew, NP  albuterol (VENTOLIN HFA) 108 (90 Base) MCG/ACT inhaler Inhale 2 puffs into the lungs every 6 (six) hours as needed for wheezing or shortness of breath. 07/17/22 10/15/22  Ivonne Andrew, NP  amLODipine (NORVASC) 10 MG tablet Take 1 tablet (10 mg total) by mouth daily. 04/17/22   Ivonne Andrew, NP  amoxicillin-clavulanate (AUGMENTIN) 875-125 MG tablet Take 1 tablet by mouth 2 (two) times daily. Patient not taking: Reported on 07/17/2022 06/30/22   Massie Maroon, FNP  cetirizine (ZYRTEC) 10 MG tablet TAKE 1 TABLET BY MOUTH EVERY DAY AS NEEDED FOR ALLERGY Patient taking differently: Take 10 mg by mouth daily as needed for allergies. 04/17/22   Ivonne Andrew, NP  cloNIDine (CATAPRES) 0.1 MG tablet TAKE 1 TABLET BY MOUTH 2 TIMES DAILY. 07/17/22   Ivonne Andrew, NP  DULoxetine (CYMBALTA) 30 MG capsule Take 1 capsule (30 mg total) by mouth daily. 04/17/22   Ivonne Andrew, NP  gabapentin (NEURONTIN) 300 MG capsule Take 1 capsule (300 mg total) by mouth 2 (two) times daily. 07/17/22   Ivonne Andrew, NP  losartan-hydrochlorothiazide (HYZAAR) 100-25 MG tablet Take 1 tablet by mouth daily. 04/17/22   Ivonne Andrew, NP  metoprolol succinate (TOPROL-XL)  50 MG 24 hr tablet Take 1 tablet (50 mg total) by mouth daily. Take with or immediately following a meal. 04/17/22   Ivonne Andrew, NP  montelukast (SINGULAIR) 10 MG tablet TAKE 1 TABLET BY MOUTH EVERYDAY AT BEDTIME Patient taking differently: Take 10 mg by mouth at bedtime. 04/17/22   Ivonne Andrew, NP  oxycodone (ROXICODONE) 30 MG immediate release tablet Take 1 tablet (30 mg total) by mouth every 6 (six) hours as needed for pain. 08/14/22   Ivonne Andrew, NP  pantoprazole (PROTONIX) 40 MG tablet Take 1 tablet (40 mg total) by mouth daily. Patient taking differently: Take 40 mg by mouth See admin instructions. Take 40 mg by mouth at least 30 minutes before a meal- up to once a day 04/17/22   Ivonne Andrew, NP  tiZANidine (ZANAFLEX) 4 MG tablet Take 1 tablet (4 mg total) by mouth every 6 (six) hours as needed for muscle spasms. 08/14/22   Ivonne Andrew, NP  Vitamin D, Ergocalciferol, (DRISDOL) 1.25 MG (50000 UNIT) CAPS capsule Take 1 capsule (50,000 Units total) by mouth every 7 (seven) days. Patient not taking: Reported on 06/28/2022 01/13/22   Ivonne Andrew, NP      Allergies    Patient has no known allergies.    Review of Systems   Review of Systems  Constitutional:  Positive for diaphoresis.  Respiratory:  Positive for shortness of breath.   All other systems reviewed and are negative.   Physical Exam Updated Vital Signs BP (!) 128/92   Pulse (!) 104   Temp 98 F (36.7 C) (Oral)   Resp (!) 26   Ht 5\' 8"  (1.727 m)   Wt 96.1 kg   SpO2 96%   BMI 32.21 kg/m  Physical Exam Vitals and nursing note reviewed.  Constitutional:      Appearance: Normal appearance. He is diaphoretic.  HENT:     Head: Normocephalic and atraumatic.     Right Ear: External ear normal.     Left Ear: External ear normal.     Nose: Nose normal.     Mouth/Throat:     Mouth: Mucous membranes are dry.  Eyes:     Extraocular Movements: Extraocular movements intact.     Conjunctiva/sclera:  Conjunctivae normal.     Pupils: Pupils are equal, round, and reactive to light.  Cardiovascular:     Rate and Rhythm: Regular rhythm. Tachycardia present.     Pulses: Normal pulses.     Heart sounds: Normal heart sounds.  Pulmonary:     Effort: Tachypnea present.     Breath sounds: Wheezing present.  Abdominal:     General: Abdomen is flat. Bowel sounds are normal.     Palpations: Abdomen is soft.  Musculoskeletal:        General: Normal range of motion.     Cervical back: Normal range of motion and neck supple.  Skin:    Capillary Refill: Capillary refill takes less than 2 seconds.  Neurological:     General: No focal deficit present.     Mental Status: He is alert and oriented to person, place, and time.  Psychiatric:        Mood and Affect: Mood normal.        Behavior: Behavior normal.        Thought Content: Thought content normal.        Judgment: Judgment normal.     ED Results / Procedures / Treatments   Labs (all labs ordered are listed, but only abnormal results are displayed) Labs Reviewed  CBC WITH DIFFERENTIAL/PLATELET - Abnormal; Notable for the following components:      Result Value   WBC 14.7 (*)    HCT 38.4 (*)    MCHC 36.5 (*)    RDW 16.3 (*)    nRBC 1.1 (*)    Lymphs Abs 6.0 (*)    Monocytes Absolute 1.1 (*)    Abs Immature Granulocytes 0.25 (*)    All other components within normal limits  COMPREHENSIVE METABOLIC PANEL - Abnormal; Notable for the following components:   CO2 9 (*)    Glucose, Bld 187 (*)    Creatinine, Ser 1.73 (*)    Calcium 10.5 (*)    AST 65 (*)    Total Bilirubin 1.9 (*)    GFR, Estimated 49 (*)    Anion gap 25 (*)    All other components within normal limits  CBG MONITORING, ED - Abnormal; Notable for the following components:   Glucose-Capillary 174 (*)    All other components within normal limits  TROPONIN I (HIGH SENSITIVITY) - Abnormal; Notable for the following components:   Troponin I (High Sensitivity) 49 (*)     All other components within normal limits  URINALYSIS, ROUTINE W REFLEX MICROSCOPIC  RAPID URINE DRUG SCREEN, HOSP PERFORMED  TROPONIN I (HIGH SENSITIVITY)    EKG EKG Interpretation Date/Time:  Wednesday August 23 2022 13:16:07 EDT Ventricular Rate:  112 PR Interval:  123 QRS Duration:  73 QT Interval:  343 QTC Calculation: 469 R Axis:   16  Text Interpretation: Sinus tachycardia Ventricular tachycardia, unsustained Probable left atrial enlargement No significant change since last tracing Confirmed by Jacalyn Lefevre [phone]) on 08/23/2022 2:41:09 PM  Radiology DG Chest Portable 1 View  Result Date: 08/23/2022 CLINICAL DATA:  sob EXAM: PORTABLE CHEST 1 VIEW COMPARISON:  06/27/2022. FINDINGS: Probable atelectatic changes noted at the left lung base. Bilateral lungs are otherwise clear. No acute consolidation or major lung collapse. Bilateral costophrenic angles are clear. Stable cardio-mediastinal silhouette. No acute osseous abnormalities. The soft tissues are within normal limits. IMPRESSION: No active disease. Electronically Signed   By: Jules Schick M.D.   On: 08/23/2022 14:57    Procedures Procedures    Medications Ordered in ED Medications  sodium chloride 0.9 % bolus 1,000 mL (has no administration in time range)  sodium chloride 0.9 % bolus 1,000 mL (1,000 mLs Intravenous New Bag/Given 08/23/22 1320)  ipratropium-albuterol (DUONEB) 0.5-2.5 (3) MG/3ML nebulizer solution 3 mL (3 mLs Nebulization Given 08/23/22 1415)  ketorolac (TORADOL) 30 MG/ML injection 30 mg (30 mg Intravenous Given 08/23/22 1527)    ED Course/ Medical Decision Making/ A&P                             Medical Decision Making Amount and/or Complexity of Data Reviewed Labs: ordered. Radiology: ordered.  Risk Prescription drug management.   This patient presents to the ED for concern of sob, this involves an extensive number of treatment options, and is a complaint that carries with it a high risk of  complications and morbidity.  The differential diagnosis includes heat exhaustion, pna, chf, copd, asthma   Co morbidities that complicate the patient evaluation  SCD, HTN, and GERD   Additional history obtained:  Additional history obtained from epic chart review External records from outside source obtained and reviewed including EMS report   Lab Tests:  I Ordered, and personally interpreted labs.  The pertinent results include:  cbc with wbc elevated at 14.7, cmp with cr elevated at 1.73 (cr 1.15 a month ago); trop elevated at 49, glucose elevated at 174   Imaging Studies ordered:  I ordered imaging studies including cxr  I independently visualized and interpreted imaging which showed No active disease.  I agree with the radiologist interpretation   Cardiac Monitoring:  The patient was maintained on a cardiac monitor.  I personally viewed and interpreted the cardiac monitored which showed an underlying rhythm of: st   Medicines ordered and prescription drug management:  I ordered medication including ivfs  for sx  Reevaluation of the patient after these medicines showed that the patient improved I have reviewed the patients home medicines and have made adjustments as needed  Critical Interventions:  ivfs   Problem List / ED Course:  Heat exhaustion + dehydration:  pt is looking much better.  He does have a mild AKI which I think will resolve with fluids.  He still has not urinated.  Pt signed out to Dr. Adela Lank at shift change.   Reevaluation:  After the interventions noted above, I reevaluated the patient and found that they have :improved   Social Determinants of Health:  Lives at home   Dispostion:  After consideration of the diagnostic results  and the patients response to treatment, I feel that the patent would benefit from likely d/c.          Final Clinical Impression(s) / ED Diagnoses Final diagnoses:  Heat exhaustion, initial encounter   Dehydration    Rx / DC Orders ED Discharge Orders     None         Jacalyn Lefevre, MD 08/23/22 1529

## 2022-08-23 NOTE — ED Provider Notes (Signed)
Received patient in turnover from Dr. Particia Nearing.  Please see their note for further details of Hx, PE.  Briefly patient is a 47 y.o. male with a Heat Exposure .  47 yo M with a chief complaints of feeling short of breath and sweatiness while running in 96 degree heat from the police.  The patient currently is awaiting UA as well has a second troponin.  Likely discharge post.  Patient had a significant metabolic acidosis with anion gap and so a second metabolic panel was obtained after his third liter of IV fluids.  His bicarb had improved but was still in a metabolic acidosis.  He still persistently tachycardic here as well.  I am concerned that he probably has rhabdomyolysis.  He has had some improvement of his renal function but I think he benefit from continued fluid administration.  Will discuss with medicine.  CRITICAL CARE Performed by: Rae Roam   Total critical care time: 35 minutes  Critical care time was exclusive of separately billable procedures and treating other patients.  Critical care was necessary to treat or prevent imminent or life-threatening deterioration.  Critical care was time spent personally by me on the following activities: development of treatment plan with patient and/or surrogate as well as nursing, discussions with consultants, evaluation of patient's response to treatment, examination of patient, obtaining history from patient or surrogate, ordering and performing treatments and interventions, ordering and review of laboratory studies, ordering and review of radiographic studies, pulse oximetry and re-evaluation of patient's condition.     Melene Plan, DO 08/23/22 2148

## 2022-08-23 NOTE — ED Triage Notes (Signed)
Pt was running from Police and when pt was taken into custody, pt started complaining of shortness of breath and heat exhaustion. EMS reports pt was diaphoretic. [employer] at bedside. 200cc bolus NS given by EMS. Alert and oriented x 4.  140/100 HR 110 CBG 156 97% on RA

## 2022-08-24 ENCOUNTER — Other Ambulatory Visit: Payer: Self-pay

## 2022-08-24 DIAGNOSIS — M6282 Rhabdomyolysis: Secondary | ICD-10-CM | POA: Diagnosis not present

## 2022-08-24 LAB — CBC
HCT: 38.3 % — ABNORMAL LOW (ref 39.0–52.0)
Hemoglobin: 14.3 g/dL (ref 13.0–17.0)
MCH: 29.4 pg (ref 26.0–34.0)
MCHC: 37.3 g/dL — ABNORMAL HIGH (ref 30.0–36.0)
MCV: 78.6 fL — ABNORMAL LOW (ref 80.0–100.0)
Platelets: 225 10*3/uL (ref 150–400)
RBC: 4.87 MIL/uL (ref 4.22–5.81)
RDW: 16.8 % — ABNORMAL HIGH (ref 11.5–15.5)
WBC: 16.2 10*3/uL — ABNORMAL HIGH (ref 4.0–10.5)
nRBC: 1.2 % — ABNORMAL HIGH (ref 0.0–0.2)

## 2022-08-24 LAB — RENAL FUNCTION PANEL
Albumin: 3.6 g/dL (ref 3.5–5.0)
Anion gap: 7 (ref 5–15)
BUN: 11 mg/dL (ref 6–20)
CO2: 19 mmol/L — ABNORMAL LOW (ref 22–32)
Calcium: 9.2 mg/dL (ref 8.9–10.3)
Chloride: 110 mmol/L (ref 98–111)
Creatinine, Ser: 1.28 mg/dL — ABNORMAL HIGH (ref 0.61–1.24)
GFR, Estimated: >60 mL/min (ref >60)
Glucose, Bld: 107 mg/dL — ABNORMAL HIGH (ref 70–99)
Phosphorus: 2.8 mg/dL (ref 2.5–4.6)
Potassium: 4.3 mmol/L (ref 3.5–5.1)
Sodium: 136 mmol/L (ref 135–145)

## 2022-08-24 LAB — MAGNESIUM: Magnesium: 2.4 mg/dL (ref 1.7–2.4)

## 2022-08-24 LAB — HIV ANTIBODY (ROUTINE TESTING W REFLEX): HIV Screen 4th Generation wRfx: NONREACTIVE

## 2022-08-24 LAB — CK: Total CK: >50000 U/L — ABNORMAL HIGH (ref 49–397)

## 2022-08-24 MED ORDER — SENNOSIDES-DOCUSATE SODIUM 8.6-50 MG PO TABS
1.0000 | ORAL_TABLET | Freq: Every day | ORAL | Status: DC
  Administered 2022-08-24 (×2): 1 via ORAL
  Filled 2022-08-24 (×2): qty 1

## 2022-08-24 MED ORDER — OXYCODONE HCL 5 MG PO TABS
30.0000 mg | ORAL_TABLET | Freq: Four times a day (QID) | ORAL | Status: DC | PRN
  Administered 2022-08-24 – 2022-08-25 (×2): 30 mg via ORAL
  Filled 2022-08-24 (×2): qty 6

## 2022-08-24 MED ORDER — PROCHLORPERAZINE EDISYLATE 10 MG/2ML IJ SOLN: 5.0000 mg | Freq: Four times a day (QID) | INTRAMUSCULAR | Status: DC | PRN

## 2022-08-24 MED ORDER — LACTATED RINGERS IV SOLN: INTRAVENOUS | Status: DC

## 2022-08-24 MED ORDER — CLONIDINE HCL 0.1 MG PO TABS
0.1000 mg | ORAL_TABLET | Freq: Two times a day (BID) | ORAL | Status: DC
  Administered 2022-08-24 – 2022-08-25 (×4): 0.1 mg via ORAL
  Filled 2022-08-24 (×4): qty 1

## 2022-08-24 MED ORDER — NICOTINE 21 MG/24HR TD PT24
21.0000 mg | MEDICATED_PATCH | Freq: Every day | TRANSDERMAL | Status: DC
  Administered 2022-08-24 – 2022-08-25 (×2): 21 mg via TRANSDERMAL
  Filled 2022-08-24 (×2): qty 1

## 2022-08-24 MED ORDER — ONDANSETRON HCL 4 MG/2ML IJ SOLN: 4.0000 mg | Freq: Four times a day (QID) | INTRAMUSCULAR | Status: DC | PRN

## 2022-08-24 MED ORDER — HYDROMORPHONE HCL 1 MG/ML IJ SOLN
0.5000 mg | INTRAMUSCULAR | Status: AC | PRN
  Administered 2022-08-24 (×2): 0.5 mg via INTRAVENOUS
  Filled 2022-08-24 (×3): qty 0.5

## 2022-08-24 NOTE — Progress Notes (Signed)
PROGRESS NOTE    Dean Webb  ZOX:[id number] DOB: [birth year] DOA: 08/23/2022 PCP: Ivonne Andrew, NP    Brief Narrative:   Dean Webb is a 47 y.o. male with medical history significant for essential hypertension, GERD, chronic anxiety/depression, asthma, sickle cell disease, chronic pain syndrome, on chronic opioids was brought in by [facility] police department after complaining of shortness of breath and diaphoresis and concern for heatstroke.  In the ED patient was tachycardic tachypneic and hypertensive.Cathlean Sauer without any infiltrate.  Lab however showed  high anion gap metabolic acidosis, elevated CK levels with serum bicarb of 9 and anion gap of 25.   CPK 19,281.  Troponin 49, Creatinine 1.73 with GFR 49, repeat after IV fluid hydration creatinine 1.34 with GFR greater than 60.  WBC 14.7.  Patient was then considered for admission to the hospital for further evaluation and treatment.  Assessment and plan.  Rhabdomyolysis CPK greater than 19,000 on presentation.  Intake and output charting.  CK level today more than 50,000.  Continue IV fluid hydration.  Monitor CK levels in AM.  AKI likely prerenal in the setting of dehydration Presented with creatinine 1.73 with GFR 49.  Creatinine has improved to 1.2 after hydration.  Will continue IV hydration.   High anion gap metabolic acidosis in the setting of acute renal insufficiency Presented with serum bicarb of 9 with anion gap of 25.  Has improved with hydration.   Leukocytosis, suspect reactive in the setting of rhabdomyolysis Presented with WBC of 14.7, WBC today at 16.2.  Chest x-ray and urinalysis negative for infection.  Afebrile.  Will continue to monitor.   Elevated liver chemistries likely from polysubstance use Last alcohol intake was the day prior to presentation, had 2 shots of liquor, states he does not drink regularly. Elevated AST 65, ALT 32 with 2:1 AST:ALT ratio on presentation. T bilirubin 1.9.  Check CMP in  AM.   Elevated troponin likely demand ischemia in the setting of rhabdomyolysis, AKI Slightly elevated but flat troponins. EKG without any ischemic changes.   Hypertension, BP is not at goal, elevated Continue Norvasc and clonidine from home. IV hydralazine as needed with parameters.   Sickle cell disease Chronic pain syndrome with chronic opioid use Continue Dilaudid as needed..  On oxycodone at home that has been resumed.   Polysubstance abuse UDS positive for opiates and cocaine with history of tobacco and alcohol usage.TOC consulted to provide resources for polysubstance cessation   Will put the patient on nicotine patch.    DVT prophylaxis:    Code Status:     Code Status: Full Code  Disposition: Home likely in 1 to 2 days  Status is: Inpatient Remains inpatient appropriate because: Acute rhabdomyolysis, need for IV   Family Communication: None  Consultants:  None  Procedures:  None  Antimicrobials:  None  Anti-infectives (From admission, onward)    None       Subjective: Today, patient was seen and examined at bedside.  Complains of pain in the back and on the legs with some tenderness.  Has some nausea but no nausea or vomiting.  No fever chills or rigor.  Objective: Vitals:   08/23/22 2350 08/24/22 0137 08/24/22 0357 08/24/22 0731  BP: (!) 174/129 (!) 155/107 (!) 143/107 (!) 127/93  Pulse: 92 86 (!) 110 (!) 105  Resp: 18 18 17 18   Temp: 98.4 F (36.9 C) 98 F (36.7 C) 98 F (36.7 C) 98.6 F (37 C)  TempSrc: Oral  Oral  SpO2: 97% 95% 90% 97%  Weight:      Height:        Intake/Output Summary (Last 24 hours) at 08/24/2022 1348 Last data filed at 08/23/2022 2019 Gross per 24 hour  Intake 3000 ml  Output --  Net 3000 ml   Filed Weights   08/23/22 1309  Weight: 96.1 kg    Physical Examination: Body mass index is 32.21 kg/m.   General: Muscular built, not in obvious distress HENT:   No scleral pallor or icterus noted. Oral mucosa is  moist.  Chest:  Clear breath sounds.  No crackles or wheezes.  CVS: S1 &S2 heard. No murmur.  Regular rate and rhythm. Abdomen: Soft, nontender, nondistended.  Bowel sounds are heard.   Extremities: No cyanosis, clubbing or edema.  Peripheral pulses are palpable.  Tenderness on palpation of the lower extremities. Psych: Alert, awake and oriented, normal mood CNS:  No cranial nerve deficits.  Power equal in all extremities.   Skin: Warm and dry.  No rashes noted.  Data Reviewed:   CBC: Recent Labs  Lab 08/23/22 1314 08/24/22 0321  WBC 14.7* 16.2*  NEUTROABS 7.1  --   HGB 14.0 14.3  HCT 38.4* 38.3*  MCV 80.2 78.6*  PLT 199 225    Basic Metabolic Panel: Recent Labs  Lab 08/23/22 1314 08/23/22 1910 08/24/22 0321  NA 139 137 136  K 4.6 4.2 4.3  CL 105 107 110  CO2 9* 15* 19*  GLUCOSE 187* 81 107*  BUN 7 11 11   CREATININE 1.73* 1.34* 1.28*  CALCIUM 10.5* 9.2 9.2  MG  --   --  2.4  PHOS  --   --  2.8    Liver Function Tests: Recent Labs  Lab 08/23/22 1314 08/24/22 0321  AST 65*  --   ALT 32  --   ALKPHOS 89  --   BILITOT 1.9*  --   PROT 7.8  --   ALBUMIN 4.3 3.6     Radiology Studies: DG Chest Portable 1 View  Result Date: 08/23/2022 CLINICAL DATA:  sob EXAM: PORTABLE CHEST 1 VIEW COMPARISON:  06/27/2022. FINDINGS: Probable atelectatic changes noted at the left lung base. Bilateral lungs are otherwise clear. No acute consolidation or major lung collapse. Bilateral costophrenic angles are clear. Stable cardio-mediastinal silhouette. No acute osseous abnormalities. The soft tissues are within normal limits. IMPRESSION: No active disease. Electronically Signed   By: Jules Schick M.D.   On: 08/23/2022 14:57      LOS: 1 day    Joycelyn Das, MD [facility] Available via Epic secure chat 7am-7pm After these hours, please refer to coverage provider listed on [email or web address] 08/24/2022, 1:48 PM

## 2022-08-24 NOTE — Plan of Care (Signed)

## 2022-08-24 NOTE — Hospital Course (Signed)
Dean Webb is a 47 y.o. male with medical history significant for essential hypertension, GERD, chronic anxiety/depression, asthma, sickle cell disease, chronic pain syndrome, on chronic opioids was brought in by [facility] police department after complaining of shortness of breath and diaphoresis and concern for heatstroke.  In the ED patient was tachycardic tachypneic and hypertensive.Cathlean Sauer without any infiltrate.  Lab however showed  high anion gap metabolic acidosis, elevated CK levels with serum bicarb of 9 and anion gap of 25.   CPK 19,281.  Troponin 49, Creatinine 1.73 with GFR 49, repeat after IV fluid hydration creatinine 1.34 with GFR greater than 60.  WBC 14.7.  Patient was then considered for admission to the hospital for further evaluation and treatment.  Assessment and plan.  Rhabdomyolysis CPK greater than 19,000 on presentation.  Continue IV hydration.  Intake and output charting.  CK level today more than 50,000.  Continue hydration.  AKI likely prerenal in the setting of dehydration Presented with creatinine 1.73 with GFR 49.  Creatinine has improved to 1.2 after hydration.  Will continue hydration.   High anion gap metabolic acidosis in the setting of acute renal insufficiency Presented with serum bicarb of 9 with anion gap of 25.  Has improved with hydration.   Leukocytosis, suspect reactive in the setting of rhabdomyolysis Presented with WBC of 14.7, WBC today at 16.2.  Chest x-ray and urinalysis negative for infection.  Afebrile.  Will continue to monitor.   Elevated liver chemistries likely from polysubstance use Last alcohol intake was the day prior to presentation, had 2 shots of liquor, states he does not drink regularly. Elevated AST 65, ALT 32 with 2:1 AST:ALT ratio on presentation. T bilirubin 1.9.  Check CMP in AM.   Elevated troponin likely demand ischemia in the setting of rhabdomyolysis, AKI Slightly elevated but flat troponins. EKG without any ischemic  changes.   Hypertension, BP is not at goal, elevated Continue Norvasc and clonidine from home. IV hydralazine as needed with parameters.   Sickle cell disease Chronic pain syndrome with chronic opioid use Continue Dilaudid as needed..  On oxycodone at home has been resumed.   Polysubstance abuse UDS positive for opiates and cocaine with history of tobacco and alcohol usage.TOC consulted to provide resources for polysubstance cessation

## 2022-08-24 NOTE — Plan of Care (Signed)
Patient AO x 4. VSS . Patient has slept majority of the day, without eating. Reminded patient that his food tray was at bedside. Order to disclose medical information was left by [facility] officers stating instructions on who to call when patient is ready for discharge, will put in chart.   Problem: Education: Goal: Knowledge of General Education information will improve Description: Including pain rating scale, medication(s)/side effects and non-pharmacologic comfort measures Outcome: Progressing   Problem: Health Behavior/Discharge Planning: Goal: Ability to manage health-related needs will improve Outcome: Progressing   Problem: Clinical Measurements: Goal: Ability to maintain clinical measurements within normal limits will improve Outcome: Progressing Goal: Will remain free from infection Outcome: Progressing Goal: Diagnostic test results will improve Outcome: Progressing Goal: Respiratory complications will improve Outcome: Progressing Goal: Cardiovascular complication will be avoided Outcome: Progressing   Problem: Activity: Goal: Risk for activity intolerance will decrease Outcome: Progressing   Problem: Nutrition: Goal: Adequate nutrition will be maintained Outcome: Progressing   Problem: Coping: Goal: Level of anxiety will decrease Outcome: Progressing   Problem: Elimination: Goal: Will not experience complications related to bowel motility Outcome: Progressing Goal: Will not experience complications related to urinary retention Outcome: Progressing   Problem: Pain Managment: Goal: General experience of comfort will improve Outcome: Progressing   Problem: Safety: Goal: Ability to remain free from injury will improve Outcome: Progressing   Problem: Skin Integrity: Goal: Risk for impaired skin integrity will decrease Outcome: Progressing

## 2022-08-24 NOTE — Progress Notes (Signed)
Notified bed placement the need to transfer the patient out due to the Telemetry orders. This is a med surg unit. In the meantime, more BP meds administered as the BP upon transfer to the Unit remains elevated despite the PO med. To continue to monitor.

## 2022-08-25 DIAGNOSIS — M6282 Rhabdomyolysis: Secondary | ICD-10-CM | POA: Diagnosis not present

## 2022-08-25 LAB — COMPREHENSIVE METABOLIC PANEL
ALT: 420 U/L — ABNORMAL HIGH (ref 0–44)
AST: 877 U/L — ABNORMAL HIGH (ref 15–41)
Albumin: 3.6 g/dL (ref 3.5–5.0)
Alkaline Phosphatase: 74 U/L (ref 38–126)
Anion gap: 9 (ref 5–15)
BUN: 8 mg/dL (ref 6–20)
CO2: 20 mmol/L — ABNORMAL LOW (ref 22–32)
Calcium: 9.5 mg/dL (ref 8.9–10.3)
Chloride: 105 mmol/L (ref 98–111)
Creatinine, Ser: 1.12 mg/dL (ref 0.61–1.24)
GFR, Estimated: >60 mL/min (ref >60)
Glucose, Bld: 92 mg/dL (ref 70–99)
Potassium: 3.8 mmol/L (ref 3.5–5.1)
Sodium: 134 mmol/L — ABNORMAL LOW (ref 135–145)
Total Bilirubin: 2.9 mg/dL — ABNORMAL HIGH (ref 0.3–1.2)
Total Protein: 6.4 g/dL — ABNORMAL LOW (ref 6.5–8.1)

## 2022-08-25 LAB — CBC
HCT: 35.5 % — ABNORMAL LOW (ref 39.0–52.0)
Hemoglobin: 13.2 g/dL (ref 13.0–17.0)
MCH: 28.9 pg (ref 26.0–34.0)
MCHC: 37.2 g/dL — ABNORMAL HIGH (ref 30.0–36.0)
MCV: 77.8 fL — ABNORMAL LOW (ref 80.0–100.0)
Platelets: 257 10*3/uL (ref 150–400)
RBC: 4.56 MIL/uL (ref 4.22–5.81)
RDW: 16.8 % — ABNORMAL HIGH (ref 11.5–15.5)
WBC: 13.8 10*3/uL — ABNORMAL HIGH (ref 4.0–10.5)
nRBC: 2.7 % — ABNORMAL HIGH (ref 0.0–0.2)

## 2022-08-25 LAB — CK: Total CK: 7941 U/L — ABNORMAL HIGH (ref 49–397)

## 2022-08-25 LAB — MAGNESIUM: Magnesium: 2 mg/dL (ref 1.7–2.4)

## 2022-08-25 MED ORDER — DULOXETINE HCL 30 MG PO CPEP: 30.0000 mg | ORAL_CAPSULE | Freq: Every day | ORAL | 1 refills | Status: AC

## 2022-08-25 MED ORDER — NICOTINE 21 MG/24HR TD PT24: 21.0000 mg | MEDICATED_PATCH | Freq: Every day | TRANSDERMAL | 0 refills | Status: DC

## 2022-08-25 MED ORDER — LOSARTAN POTASSIUM-HCTZ 100-25 MG PO TABS: 1.0000 | ORAL_TABLET | Freq: Every day | ORAL | 0 refills | Status: AC

## 2022-08-25 NOTE — Discharge Summary (Signed)
Physician Discharge Summary  Per Notaro Empson QMV:[id number] DOB: Oct 04, 1975 DOA: 08/23/2022  PCP: Ivonne Andrew, NP  Admit date: 08/23/2022 Discharge date: 08/25/2022  Admitted From: Home  Discharge disposition: Home  Recommendations for Outpatient Follow-Up:   Follow up with your primary care provider in one week.  Check CBC, BMP, magnesium in the next visit  Discharge Diagnosis:   Principal Problem:   Rhabdomyolysis  Discharge Condition: Improved.  Diet recommendation:  Regular.  Wound care: None.  Code status: Full.   History of Present Illness:   Dean Webb is a 47 y.o. male with medical history significant for essential hypertension, GERD, chronic anxiety/depression, asthma, sickle cell disease, chronic pain syndrome, on chronic opioids was brought in by [facility] police department after complaining of shortness of breath and diaphoresis and concern for heatstroke.  In the ED, patient was tachycardic tachypneic and hypertensive.Cathlean Sauer of the chest without any infiltrate.  Lab however showed  high anion gap metabolic acidosis, elevated CK levels with serum bicarb of 9 and anion gap of 25.   CPK 19,281.  Troponin 49, Creatinine 1.73 with GFR 49, repeat after IV fluid hydration creatinine 1.34 with GFR greater than 60.  WBC 14.7.  Patient was then considered for admission to the hospital for further evaluation and treatment.   Hospital Course:   Following conditions were addressed during hospitalization as listed below,  Rhabdomyolysis CPK greater than 19,000 on presentation  CK level has trended down to 7941 and patient has been having good urinary output with improved renal function.  Clinically improved with IV fluids.   AKI likely prerenal in the setting of dehydration Presented with creatinine 1.73 with GFR 49.  Creatinine has improved to 1.21 after hydration.  AKI has resolved.  Patient was advised to continue to drink plenty of fluids after discharge.    High anion gap metabolic acidosis in the setting of acute renal insufficiency Presented with serum bicarb of 9 with anion gap of 25.  Has improved with hydration.   Leukocytosis, likely reactive. Presented with WBC of 14.7, WBC today at 13.8.  Chest x-ray and urinalysis negative for infection.  Afebrile.  No signs of infection.    Elevated liver chemistries likely from polysubstance use and rhabdomyolysis. Last alcohol intake was the day prior to presentation, had 2 shots of liquor, states he does not drink regularly. Elevated AST 65, ALT 32 with 2:1 AST:ALT ratio on presentation. T bilirubin 1.9.  Counseled against alcohol usage.   Elevated troponin likely demand ischemia in the setting of rhabdomyolysis, AKI Slightly elevated but flat troponins. EKG without any ischemic changes.   Hypertension, Continue Norvasc and clonidine from home.   Sickle cell disease Chronic pain syndrome with chronic opioid use Continue oxycodone from home  Polysubstance abuse UDS positive for opiates and cocaine with history of tobacco and alcohol usage.TOC consulted, patient received nicotine patch.  Counseling done for quitting since there is possibility of rhabdomyolysis from cocaine use.   Disposition.  At this time, patient is stable for disposition with outpatient PCP follow-up.  Medical Consultants:   None.  Procedures:    None Subjective:   Today, patient feels okay.  Has mild pain in the legs.  No nausea, vomiting fever chills or rigor.  Has ambulated to the bathroom.  Discharge Exam:   Vitals:   08/25/22 0337 08/25/22 0720  BP: (!) 147/95 (!) 157/86  Pulse: 97 66  Resp: 18 16  Temp: 98.8 F (37.1 C) 98.2 F (36.8  C)  SpO2: 98% 95%   Vitals:   08/24/22 1955 08/24/22 2102 08/25/22 0337 08/25/22 0720  BP: (!) 135/96 (!) 144/99 (!) 147/95 (!) 157/86  Pulse: (!) 101  97 66  Resp: 20  18 16   Temp: 99.1 F (37.3 C)  98.8 F (37.1 C) 98.2 F (36.8 C)  TempSrc: Oral  Oral Oral   SpO2: 97%  98% 95%  Weight:      Height:       General: Alert awake, not in obvious distress, muscular built. HENT: pupils equally reacting to light,  No scleral pallor or icterus noted. Oral mucosa is moist.  Chest:  Clear breath sounds.  Diminished breath sounds bilaterally. No crackles or wheezes.  CVS: S1 &S2 heard. No murmur.  Regular rate and rhythm. Abdomen: Soft, nontender, nondistended.  Bowel sounds are heard.   Extremities: No cyanosis, clubbing or edema.  Peripheral pulses are palpable. Psych: Alert, awake and oriented, normal mood CNS:  No cranial nerve deficits.  Power equal in all extremities.   Skin: Warm and dry.  No rashes noted.  The results of significant diagnostics from this hospitalization (including imaging, microbiology, ancillary and laboratory) are listed below for reference.     Diagnostic Studies:   DG Chest Portable 1 View  Result Date: 08/23/2022 CLINICAL DATA:  sob EXAM: PORTABLE CHEST 1 VIEW COMPARISON:  06/27/2022. FINDINGS: Probable atelectatic changes noted at the left lung base. Bilateral lungs are otherwise clear. No acute consolidation or major lung collapse. Bilateral costophrenic angles are clear. Stable cardio-mediastinal silhouette. No acute osseous abnormalities. The soft tissues are within normal limits. IMPRESSION: No active disease. Electronically Signed   By: Jules Schick M.D.   On: 08/23/2022 14:57     Labs:   Basic Metabolic Panel: Recent Labs  Lab 08/23/22 1314 08/23/22 1910 08/24/22 0321 08/25/22 0354  NA 139 137 136 134*  K 4.6 4.2 4.3 3.8  CL 105 107 110 105  CO2 9* 15* 19* 20*  GLUCOSE 187* 81 107* 92  BUN 7 11 11 8   CREATININE 1.73* 1.34* 1.28* 1.12  CALCIUM 10.5* 9.2 9.2 9.5  MG  --   --  2.4 2.0  PHOS  --   --  2.8  --    GFR Estimated Creatinine Clearance: 92.7 mL/min (by C-G formula based on SCr of 1.12 mg/dL). Liver Function Tests: Recent Labs  Lab 08/23/22 1314 08/24/22 0321 08/25/22 0354  AST 65*   --  877*  ALT 32  --  420*  ALKPHOS 89  --  74  BILITOT 1.9*  --  2.9*  PROT 7.8  --  6.4*  ALBUMIN 4.3 3.6 3.6   No results for input(s): "LIPASE", "AMYLASE" in the last 168 hours. No results for input(s): "AMMONIA" in the last 168 hours. Coagulation profile No results for input(s): "INR", "PROTIME" in the last 168 hours.  CBC: Recent Labs  Lab 08/23/22 1314 08/24/22 0321 08/25/22 0354  WBC 14.7* 16.2* 13.8*  NEUTROABS 7.1  --   --   HGB 14.0 14.3 13.2  HCT 38.4* 38.3* 35.5*  MCV 80.2 78.6* 77.8*  PLT 199 225 257   Cardiac Enzymes: Recent Labs  Lab 08/23/22 1910 08/24/22 0321 08/25/22 0354  CKTOTAL 19,281* >50,000* 7,941*   BNP: Invalid input(s): "POCBNP" CBG: Recent Labs  Lab 08/23/22 1320  GLUCAP 174*   D-Dimer No results for input(s): "DDIMER" in the last 72 hours. Hgb A1c No results for input(s): "HGBA1C" in the last 72 hours.  Lipid Profile No results for input(s): "CHOL", "HDL", "LDLCALC", "TRIG", "CHOLHDL", "LDLDIRECT" in the last 72 hours. Thyroid function studies No results for input(s): "TSH", "T4TOTAL", "T3FREE", "THYROIDAB" in the last 72 hours.  Invalid input(s): "FREET3" Anemia work up No results for input(s): "VITAMINB12", "FOLATE", "FERRITIN", "TIBC", "IRON", "RETICCTPCT" in the last 72 hours. Microbiology No results found for this or any previous visit (from the past 240 hour(s)).   Discharge Instructions:   Discharge Instructions     Diet - low sodium heart healthy   Complete by: As directed    Discharge instructions   Complete by: As directed    Increase fluid hydration.  Follow-up with your primary care provider in 1 week.Seek medical attention for worsening symptoms.   Increase activity slowly   Complete by: As directed       Allergies as of 08/25/2022   No Known Allergies      Medication List     TAKE these medications    albuterol 108 (90 Base) MCG/ACT inhaler Commonly known as: VENTOLIN HFA Inhale 2 puffs into  the lungs every 6 (six) hours as needed for wheezing or shortness of breath.   amLODipine 10 MG tablet Commonly known as: NORVASC Take 1 tablet (10 mg total) by mouth daily.   cetirizine 10 MG tablet Commonly known as: ZYRTEC TAKE 1 TABLET BY MOUTH EVERY DAY AS NEEDED FOR ALLERGY What changed:  how much to take how to take this when to take this reasons to take this additional instructions   cloNIDine 0.1 MG tablet Commonly known as: CATAPRES TAKE 1 TABLET BY MOUTH 2 TIMES DAILY.   DULoxetine 30 MG capsule Commonly known as: Cymbalta Take 1 capsule (30 mg total) by mouth daily.   gabapentin 300 MG capsule Commonly known as: NEURONTIN Take 1 capsule (300 mg total) by mouth 2 (two) times daily.   losartan-hydrochlorothiazide 100-25 MG tablet Commonly known as: HYZAAR Take 1 tablet by mouth daily. Start taking on: August 27, 2022 What changed: These instructions start on August 27, 2022. If you are unsure what to do until then, ask your doctor or other care provider.   metoprolol succinate 50 MG 24 hr tablet Commonly known as: TOPROL-XL Take 1 tablet (50 mg total) by mouth daily. Take with or immediately following a meal.   montelukast 10 MG tablet Commonly known as: SINGULAIR TAKE 1 TABLET BY MOUTH EVERYDAY AT BEDTIME What changed:  how much to take how to take this when to take this additional instructions   nicotine 21 mg/24hr patch Commonly known as: NICODERM CQ - dosed in mg/24 hours Place 1 patch (21 mg total) onto the skin daily. Start taking on: August 26, 2022   oxycodone 30 MG immediate release tablet Commonly known as: ROXICODONE Take 1 tablet (30 mg total) by mouth every 6 (six) hours as needed for pain.   pantoprazole 40 MG tablet Commonly known as: PROTONIX Take 1 tablet (40 mg total) by mouth daily. What changed:  when to take this additional instructions   tiZANidine 4 MG tablet Commonly known as: Zanaflex Take 1 tablet (4 mg total) by mouth  every 6 (six) hours as needed for muscle spasms.        Follow-up Information     Schedule an appointment as soon as possible for a visit  with Ivonne Andrew, NP.   Specialties: Pulmonary Disease, Endocrinology Why: As needed Contact information: [address] [place] [address] [phone]  Time coordinating discharge: 39 minutes  Signed:  Shaneeka Scarboro  [facility] 08/25/2022, 9:46 AM

## 2022-08-25 NOTE — Plan of Care (Signed)
Patient in bed sleeping. AO x 4. VSS. Medications taken without complications. Patient will be transported to [facility]. Order to disclose medical information in patients chart. [facility] [employer] control surveyor was called and made aware that patient is discharging. Communicator states she has dispatched a Technical sales engineer for pickup.

## 2022-08-25 NOTE — Plan of Care (Signed)

## 2022-08-27 ENCOUNTER — Other Ambulatory Visit: Payer: Self-pay | Admitting: Family Medicine

## 2022-08-27 DIAGNOSIS — G894 Chronic pain syndrome: Secondary | ICD-10-CM

## 2022-08-27 DIAGNOSIS — D57219 Sickle-cell/Hb-C disease with crisis, unspecified: Secondary | ICD-10-CM

## 2022-08-27 MED ORDER — OXYCODONE HCL 30 MG PO TABS
30.0000 mg | ORAL_TABLET | Freq: Four times a day (QID) | ORAL | 0 refills | Status: DC | PRN
Start: 2022-08-29 — End: 2022-09-13

## 2022-08-27 NOTE — Telephone Encounter (Signed)
Reviewed PDMP substance reporting system prior to prescribing opiate medications. No inconsistencies noted.    Meds ordered this encounter  Medications   oxycodone (ROXICODONE) 30 MG immediate release tablet    Sig: Take 1 tablet (30 mg total) by mouth every 6 (six) hours as needed for pain.    Dispense:  60 tablet    Refill:  0    Order Specific Question:   Supervising Provider    Answer:   JEGEDE, OLUGBEMIGA E [[id number]]     Parrish Bonn Moore Addylynn Balin  APRN, MSN, FNP-C [facility] [address]  [place], [place] [address] [phone]  

## 2022-08-28 ENCOUNTER — Telehealth: Payer: Self-pay

## 2022-08-28 NOTE — Transitions of Care (Post Inpatient/ED Visit) (Signed)
   08/28/2022  Name: Dean Webb MRN: [id number] DOB: 07-05-1975  Today's TOC FU Call Status: Today's TOC FU Call Status:: Unsuccessul Call (1st Attempt) Unsuccessful Call (1st Attempt) Date: 08/28/22  Attempted to reach the patient regarding the most recent Inpatient/ED visit.  Follow Up Plan: Additional outreach attempts will be made to reach the patient to complete the Transitions of Care (Post Inpatient/ED visit) call.      Antionette Fairy, RN,BSN,CCM [facility] Care Management Care Management Community Coordinator Direct Phone: [phone] Toll Free: [phone] Fax: [phone]

## 2022-08-29 ENCOUNTER — Telehealth: Payer: Self-pay

## 2022-08-29 NOTE — Transitions of Care (Post Inpatient/ED Visit) (Signed)
   08/29/2022  Name: Dean Webb MRN: [id number] DOB: 06-Jan-1976  Today's TOC FU Call Status: Today's TOC FU Call Status:: Unsuccessful Call (3rd Attempt) Unsuccessful Call (3rd Attempt) Date: 08/29/22  Attempted to reach the patient regarding the most recent Inpatient/ED visit.  Follow Up Plan: No further outreach attempts will be made at this time. We have been unable to contact the patient.     Antionette Fairy, RN,BSN,CCM [facility] Care Management Care Management Community Coordinator Direct Phone: [phone] Toll Free: [phone] Fax: [phone]

## 2022-08-29 NOTE — Transitions of Care (Post Inpatient/ED Visit) (Addendum)
   08/29/2022  Name: Dean Webb MRN: [id number] DOB: 1975-03-13  Today's TOC FU Call Status: Today's TOC FU Call Status:: Unsuccessful Call (2nd Attempt) Unsuccessful Call (2nd Attempt) Date: 08/29/22  Attempted to reach the patient regarding the most recent Inpatient/ED visit.  Follow Up Plan: Additional outreach attempts will be made to reach the patient to complete the Transitions of Care (Post Inpatient/ED visit) call.   Antionette Fairy, RN,BSN,CCM [facility] Care Management Care Management Community Coordinator Direct Phone: [phone] Toll Free: [phone] Fax: [phone]

## 2022-09-05 ENCOUNTER — Other Ambulatory Visit: Payer: Self-pay | Admitting: Nurse Practitioner

## 2022-09-05 DIAGNOSIS — J189 Pneumonia, unspecified organism: Secondary | ICD-10-CM

## 2022-09-05 DIAGNOSIS — J4520 Mild intermittent asthma, uncomplicated: Secondary | ICD-10-CM

## 2022-09-05 NOTE — Telephone Encounter (Signed)
Please advise Kh 

## 2022-09-12 NOTE — Telephone Encounter (Signed)
Caller & Relationship to patient:  MRN #  [id number]   Call Back Number:   Date of Last Office Visit: Visit date not found     Date of Next Office Visit: Visit date not found    Medication(s) to be Refilled: Oxycodone   Preferred Pharmacy:   ** Please notify patient to allow 48-72 hours to process** **Let patient know to contact pharmacy at the end of the day to make sure medication is ready. ** **If patient has not been seen in a year or longer, book an appointment **Advise to use MyChart for refill requests OR to contact their pharmacy

## 2022-09-13 ENCOUNTER — Other Ambulatory Visit: Payer: Self-pay | Admitting: Nurse Practitioner

## 2022-09-13 DIAGNOSIS — D57219 Sickle-cell/Hb-C disease with crisis, unspecified: Secondary | ICD-10-CM

## 2022-09-13 DIAGNOSIS — G894 Chronic pain syndrome: Secondary | ICD-10-CM

## 2022-09-13 MED ORDER — OXYCODONE HCL 30 MG PO TABS
30.0000 mg | ORAL_TABLET | Freq: Four times a day (QID) | ORAL | 0 refills | Status: DC | PRN
Start: 2022-09-13 — End: 2022-09-27

## 2022-09-27 ENCOUNTER — Other Ambulatory Visit: Payer: Self-pay

## 2022-09-27 DIAGNOSIS — D57219 Sickle-cell/Hb-C disease with crisis, unspecified: Secondary | ICD-10-CM

## 2022-09-27 DIAGNOSIS — G894 Chronic pain syndrome: Secondary | ICD-10-CM

## 2022-09-27 NOTE — Telephone Encounter (Signed)
Please advise Kh 

## 2022-09-28 MED ORDER — OXYCODONE HCL 30 MG PO TABS
30.0000 mg | ORAL_TABLET | Freq: Four times a day (QID) | ORAL | 0 refills | Status: DC | PRN
Start: 2022-09-28 — End: 2022-10-13

## 2022-10-13 ENCOUNTER — Other Ambulatory Visit: Payer: Self-pay | Admitting: Nurse Practitioner

## 2022-10-13 ENCOUNTER — Telehealth: Payer: Self-pay | Admitting: Nurse Practitioner

## 2022-10-13 DIAGNOSIS — D57219 Sickle-cell/Hb-C disease with crisis, unspecified: Secondary | ICD-10-CM

## 2022-10-13 DIAGNOSIS — G894 Chronic pain syndrome: Secondary | ICD-10-CM

## 2022-10-13 MED ORDER — TIZANIDINE HCL 4 MG PO TABS: 4.0000 mg | ORAL_TABLET | Freq: Four times a day (QID) | ORAL | 0 refills | Status: AC | PRN

## 2022-10-13 MED ORDER — OXYCODONE HCL 30 MG PO TABS
30.0000 mg | ORAL_TABLET | Freq: Four times a day (QID) | ORAL | 0 refills | Status: DC | PRN
Start: 2022-10-13 — End: 2022-10-25

## 2022-10-13 NOTE — Telephone Encounter (Signed)
Caller & Relationship to patient:  MRN #  [id number]   Call Back Number:   Date of Last Office Visit: 09/27/2022     Date of Next Office Visit: 10/19/2022    Medication(s) to be Refilled: oxycodone   Preferred Pharmacy:   ** Please notify patient to allow 48-72 hours to process** **Let patient know to contact pharmacy at the end of the day to make sure medication is ready. ** **If patient has not been seen in a year or longer, book an appointment **Advise to use MyChart for refill requests OR to contact their pharmacy

## 2022-10-19 ENCOUNTER — Ambulatory Visit: Payer: Self-pay | Admitting: Nurse Practitioner

## 2022-10-25 ENCOUNTER — Ambulatory Visit (INDEPENDENT_AMBULATORY_CARE_PROVIDER_SITE_OTHER): Payer: 59 | Admitting: Nurse Practitioner

## 2022-10-25 ENCOUNTER — Encounter: Payer: Self-pay | Admitting: Nurse Practitioner

## 2022-10-25 VITALS — BP 173/117 | HR 95 | Temp 98.5°F | Resp 14 | Ht 68.0 in | Wt 211.0 lb

## 2022-10-25 DIAGNOSIS — Z716 Tobacco abuse counseling: Secondary | ICD-10-CM | POA: Diagnosis not present

## 2022-10-25 DIAGNOSIS — I1A0 Resistant hypertension: Secondary | ICD-10-CM

## 2022-10-25 DIAGNOSIS — D57219 Sickle-cell/Hb-C disease with crisis, unspecified: Secondary | ICD-10-CM

## 2022-10-25 DIAGNOSIS — G894 Chronic pain syndrome: Secondary | ICD-10-CM

## 2022-10-25 DIAGNOSIS — R0683 Snoring: Secondary | ICD-10-CM

## 2022-10-25 MED ORDER — OXYCODONE HCL 30 MG PO TABS
30.0000 mg | ORAL_TABLET | Freq: Four times a day (QID) | ORAL | 0 refills | Status: DC | PRN
Start: 2022-10-27 — End: 2022-11-09

## 2022-10-25 MED ORDER — NICOTINE 21 MG/24HR TD PT24
21.0000 mg | MEDICATED_PATCH | Freq: Every day | TRANSDERMAL | 0 refills | Status: DC
Start: 2022-10-25 — End: 2022-11-22

## 2022-10-25 NOTE — Patient Instructions (Addendum)
1. Chronic pain syndrome  - oxycodone (ROXICODONE) 30 MG immediate release tablet; Take 1 tablet (30 mg total) by mouth every 6 (six) hours as needed for pain.  Dispense: 60 tablet; Refill: 0   2. Sickle-cell/Hb-C disease with crisis, unspecified (HCC)  - oxycodone (ROXICODONE) 30 MG immediate release tablet; Take 1 tablet (30 mg total) by mouth every 6 (six) hours as needed for pain.  Dispense: 60 tablet; Refill: 0   3. Encounter for smoking cessation counseling  - nicotine (NICODERM CQ - DOSED IN MG/24 HOURS) 21 mg/24hr patch; Place 1 patch (21 mg total) onto the skin daily.  Dispense: 28 patch; Refill: 0   4. Snoring  - Ambulatory referral to Pulmonology    Follow up:  Follow up in 3 months

## 2022-10-25 NOTE — Progress Notes (Unsigned)
@Patient  ID: Dean Webb, male    DOB: September 04, 1975, 47 y.o.   MRN: [id number]  Chief Complaint  Patient presents with   Follow-up    Referring provider: Ivonne Andrew, NP   HPI  Dean Webb is a 47 y.o. male  has a past medical history of Asthma, GERD (gastroesophageal reflux disease), Hb-S/Hb-C disease (HCC) (1979), HTN (hypertension) (2008), Hypertension, Sickle cell anemia (HCC), and Sickle cell disease (HCC).   Patient presents today for follow-up on sickle cell and hypertension.  Blood pressure is elevated in office today.  Will give clonidine.  Patient will check blood pressure at home this afternoon.  Patient will take the rest of his blood pressure medicine this afternoon.  Nurse visit tomorrow to recheck blood pressure patient is scheduled to go to town this weekend.  Patient will also need to return tomorrow for lab visit. Will place referral to hypertesion clinic.  Patient is currently on metoprolol XL, Hyzaar, clonidine, amlodipine.  Patient states that he is compliant with medications and did take medications today. Denies f/c/s, n/v/d, hemoptysis, PND, leg swelling Denies chest pain or edema       No Known Allergies  Immunization History  Administered Date(s) Administered   Influenza,inj,Quad PF,6+ Mos 03/09/2016, 10/25/2017, 01/17/2021, 01/12/2022   Pneumococcal Polysaccharide-23 03/09/2016   Tdap 08/03/2017    Past Medical History:  Diagnosis Date   Asthma    GERD (gastroesophageal reflux disease)    Hb-S/Hb-C disease (HCC) 1979   HTN (hypertension) 2008   Hypertension    Sickle cell anemia (HCC)    Sickle cell disease (HCC)     Tobacco History: Social History   Tobacco Use  Smoking Status Every Day   Current packs/day: 0.50   Average packs/day: 0.5 packs/day for 17.0 years (8.5 ttl pk-yrs)   Types: Cigarettes  Smokeless Tobacco Never   Ready to quit: Not Answered Counseling given: Not Answered   Outpatient Encounter Medications  as of 10/25/2022  Medication Sig   albuterol (VENTOLIN HFA) 108 (90 Base) MCG/ACT inhaler TAKE 2 PUFFS BY MOUTH EVERY 6 HOURS AS NEEDED FOR WHEEZE OR SHORTNESS OF BREATH   amLODipine (NORVASC) 10 MG tablet Take 1 tablet (10 mg total) by mouth daily.   cetirizine (ZYRTEC) 10 MG tablet TAKE 1 TABLET BY MOUTH EVERY DAY AS NEEDED FOR ALLERGY (Patient taking differently: Take 10 mg by mouth daily as needed for allergies.)   cloNIDine (CATAPRES) 0.1 MG tablet TAKE 1 TABLET BY MOUTH 2 TIMES DAILY.   DULoxetine (CYMBALTA) 30 MG capsule Take 1 capsule (30 mg total) by mouth daily.   gabapentin (NEURONTIN) 300 MG capsule Take 1 capsule (300 mg total) by mouth 2 (two) times daily.   losartan-hydrochlorothiazide (HYZAAR) 100-25 MG tablet Take 1 tablet by mouth daily.   metoprolol succinate (TOPROL-XL) 50 MG 24 hr tablet Take 1 tablet (50 mg total) by mouth daily. Take with or immediately following a meal.   montelukast (SINGULAIR) 10 MG tablet TAKE 1 TABLET BY MOUTH EVERYDAY AT BEDTIME (Patient taking differently: Take 10 mg by mouth at bedtime.)   pantoprazole (PROTONIX) 40 MG tablet Take 1 tablet (40 mg total) by mouth daily. (Patient taking differently: Take 40 mg by mouth See admin instructions. Take 40 mg by mouth at least 30 minutes before a meal- up to once a day)   tiZANidine (ZANAFLEX) 4 MG tablet Take 1 tablet (4 mg total) by mouth every 6 (six) hours as needed for muscle spasms.   [DISCONTINUED]  nicotine (NICODERM CQ - DOSED IN MG/24 HOURS) 21 mg/24hr patch Place 1 patch (21 mg total) onto the skin daily.   [DISCONTINUED] oxycodone (ROXICODONE) 30 MG immediate release tablet Take 1 tablet (30 mg total) by mouth every 6 (six) hours as needed for pain.   nicotine (NICODERM CQ - DOSED IN MG/24 HOURS) 21 mg/24hr patch Place 1 patch (21 mg total) onto the skin daily.   [START ON 10/27/2022] oxycodone (ROXICODONE) 30 MG immediate release tablet Take 1 tablet (30 mg total) by mouth every 6 (six) hours as  needed for pain.   Facility-Administered Encounter Medications as of 10/25/2022  Medication   cloNIDine (CATAPRES) tablet 0.3 mg     Review of Systems  Review of Systems  Constitutional: Negative.   HENT: Negative.    Cardiovascular: Negative.   Gastrointestinal: Negative.   Allergic/Immunologic: Negative.   Neurological: Negative.   Psychiatric/Behavioral: Negative.         Physical Exam  BP (!) 173/117 (BP Location: Right Arm, Patient Position: Sitting, Cuff Size: Normal)   Pulse 95   Temp 98.5 F (36.9 C)   Resp 14   Ht 5\' 8"  (1.727 m)   Wt 211 lb (95.7 kg)   SpO2 99%   BMI 32.08 kg/m   Wt Readings from Last 5 Encounters:  10/25/22 211 lb (95.7 kg)  07/17/22 211 lb 12.8 oz (96.1 kg)  06/28/22 208 lb 3.2 oz (94.4 kg)  06/27/22 208 lb 3.2 oz (94.4 kg)  04/17/22 208 lb 3.2 oz (94.4 kg)     Physical Exam Vitals and nursing note reviewed.  Constitutional:      General: He is not in acute distress.    Appearance: He is well-developed.  Cardiovascular:     Rate and Rhythm: Normal rate and regular rhythm.  Pulmonary:     Effort: Pulmonary effort is normal.     Breath sounds: Normal breath sounds.  Skin:    General: Skin is warm and dry.  Neurological:     Mental Status: He is alert and oriented to person, place, and time.      Lab Results:  CBC    Component Value Date/Time   WBC 13.8 (H) 08/25/2022 0354   RBC 4.56 08/25/2022 0354   HGB 13.2 08/25/2022 0354   HGB 12.6 (L) 07/17/2022 1531   HCT 35.5 (L) 08/25/2022 0354   HCT 38.3 07/17/2022 1531   PLT 257 08/25/2022 0354   PLT 406 07/17/2022 1531   MCV 77.8 (L) 08/25/2022 0354   MCV 85 07/17/2022 1531   MCH 28.9 08/25/2022 0354   MCHC 37.2 (H) 08/25/2022 0354   RDW 16.8 (H) 08/25/2022 0354   RDW 18.4 (H) 07/17/2022 1531   LYMPHSABS 6.0 (H) 08/23/2022 1314   LYMPHSABS 3.9 (H) 04/17/2022 1529   MONOABS 1.1 (H) 08/23/2022 1314   EOSABS 0.2 08/23/2022 1314   EOSABS 0.5 (H) 04/17/2022 1529    BASOSABS 0.1 08/23/2022 1314   BASOSABS 0.1 04/17/2022 1529    BMET    Component Value Date/Time   NA 134 (L) 08/25/2022 0354   NA 138 07/17/2022 1531   K 3.8 08/25/2022 0354   CL 105 08/25/2022 0354   CO2 20 (L) 08/25/2022 0354   GLUCOSE 92 08/25/2022 0354   BUN 8 08/25/2022 0354   BUN 7 07/17/2022 1531   CREATININE 1.12 08/25/2022 0354   CALCIUM 9.5 08/25/2022 0354   GFRNONAA >60 08/25/2022 0354   GFRAA 89 03/22/2020 1226     Assessment &  Plan:   Chronic pain syndrome - oxycodone (ROXICODONE) 30 MG immediate release tablet; Take 1 tablet (30 mg total) by mouth every 6 (six) hours as needed for pain.  Dispense: 60 tablet; Refill: 0   2. Sickle-cell/Hb-C disease with crisis, unspecified (HCC)  - oxycodone (ROXICODONE) 30 MG immediate release tablet; Take 1 tablet (30 mg total) by mouth every 6 (six) hours as needed for pain.  Dispense: 60 tablet; Refill: 0   3. Encounter for smoking cessation counseling  - nicotine (NICODERM CQ - DOSED IN MG/24 HOURS) 21 mg/24hr patch; Place 1 patch (21 mg total) onto the skin daily.  Dispense: 28 patch; Refill: 0   4. Snoring  - Ambulatory referral to Pulmonology    Follow up:  Follow up in 3 months     Ivonne Andrew, NP 10/26/2022

## 2022-10-26 ENCOUNTER — Encounter: Payer: Self-pay | Admitting: Nurse Practitioner

## 2022-10-26 ENCOUNTER — Ambulatory Visit: Payer: Self-pay

## 2022-10-26 DIAGNOSIS — G894 Chronic pain syndrome: Secondary | ICD-10-CM | POA: Insufficient documentation

## 2022-10-26 NOTE — Assessment & Plan Note (Signed)
-   oxycodone (ROXICODONE) 30 MG immediate release tablet; Take 1 tablet (30 mg total) by mouth every 6 (six) hours as needed for pain.  Dispense: 60 tablet; Refill: 0   2. Sickle-cell/Hb-C disease with crisis, unspecified (HCC)  - oxycodone (ROXICODONE) 30 MG immediate release tablet; Take 1 tablet (30 mg total) by mouth every 6 (six) hours as needed for pain.  Dispense: 60 tablet; Refill: 0   3. Encounter for smoking cessation counseling  - nicotine (NICODERM CQ - DOSED IN MG/24 HOURS) 21 mg/24hr patch; Place 1 patch (21 mg total) onto the skin daily.  Dispense: 28 patch; Refill: 0   4. Snoring  - Ambulatory referral to Pulmonology    Follow up:  Follow up in 3 months

## 2022-10-31 ENCOUNTER — Telehealth: Payer: Self-pay

## 2022-10-31 NOTE — Patient Outreach (Addendum)
Care Guide Note  10/31/2022 Name: Dean Webb MRN: [id number] DOB: 1976/01/06  Referred by: Ivonne Andrew, NP Reason for referral : patient outreach (Outreach to schedule with RPH.)   Dean Webb is a 47 y.o. year old male who is a primary care patient of Ivonne Andrew, NP. Dean Webb was referred to the pharmacist for assistance related to HTN.    Successful contact was made with the patient to discuss pharmacy services including being ready for the pharmacist to call at least 5 minutes before the scheduled appointment time, to have medication bottles and any blood sugar or blood pressure readings ready for review. The patient agreed to meet with the pharmacist via with the pharmacist via telephone on (date/time).  Patient scheduled Thursday, November 02, 2022 at 2:30 p.m.  Dean Webb [facility] Assistant-Population Health [phone]

## 2022-11-02 ENCOUNTER — Other Ambulatory Visit: Payer: 59 | Admitting: Pharmacist

## 2022-11-02 ENCOUNTER — Telehealth: Payer: Self-pay | Admitting: Pharmacist

## 2022-11-02 NOTE — Progress Notes (Signed)
11/02/2022 Name: Dean Webb MRN: [id number] DOB: 03/08/1975  Attempted to contact patient for scheduled appointment for medication management- True North Metric Hypertension. Patient expressed misunderstanding about scheduled time and is not home, requested reschedule. Reschedule for 11/03/22 at 9:30am.  Lynnda Shields, PharmD, BCPS Clinical Pharmacist [facility]

## 2022-11-02 NOTE — Progress Notes (Unsigned)
Patient appearing on report for True North Metric - Hypertension Control report due to last documented ambulatory blood pressure of *** on ***. Next appointment with PCP is ***   Outreached patient to discuss hypertension control and medication management.   Current antihypertensives: ***  Patient {HAS/DOES NOT UEAV:40981} an automated upper arm home BP machine.  Current blood pressure readings: ***  Current meal patterns: ***  Current physical activity: ***  Patient {Actions; denies-reports:120008} hypotensive signs and symptoms including ***dizziness, lightheadedness.  Patient {Actions; denies-reports:120008} hypertensive symptoms including ***headache, chest pain, shortness of breath.  Patient {Actions; denies-reports:120008} side effects related to ***     Assessment/Plan: - Currently {CHL Controlled/Uncontrolled:[phone]} - {htnactions:27305}  SIG

## 2022-11-03 ENCOUNTER — Other Ambulatory Visit: Payer: 59 | Admitting: Pharmacist

## 2022-11-03 ENCOUNTER — Telehealth: Payer: Self-pay

## 2022-11-03 ENCOUNTER — Telehealth: Payer: Self-pay | Admitting: Pharmacist

## 2022-11-03 NOTE — Progress Notes (Signed)
11/03/2022 Name: ELGIN AGUAYO MRN: [id number] DOB: 06/07/1975   Attempted to contact patient for scheduled appointment for medication management- hypertension true north metric. Left HIPAA compliant message for patient to return my call at their convenience.   Will forward to careguide for reschedule.  Lynnda Shields, PharmD, BCPS Clinical Pharmacist [facility]

## 2022-11-03 NOTE — Patient Outreach (Signed)
Care Guide Note  11/03/2022 Name: Dean Webb MRN: [id number] DOB: [birth year]  Referred by: Ivonne Andrew, NP Reason for referral : patient outreach  (Outreach to schedule with RPH. )   Dean Webb is a 47 y.o. year old male who is a primary care patient of Ivonne Andrew, NP. Dean Webb was referred to the pharmacist for assistance related to HTN.    An unsuccessful telephone outreach was attempted today to contact the patient who was referred to the pharmacy team for assistance with HTN. Additional attempts will be made to contact the patient.   Dean Webb [facility] Assistant-Population Health [phone]

## 2022-11-09 ENCOUNTER — Other Ambulatory Visit: Payer: 59

## 2022-11-09 ENCOUNTER — Other Ambulatory Visit: Payer: Self-pay | Admitting: Nurse Practitioner

## 2022-11-09 DIAGNOSIS — G894 Chronic pain syndrome: Secondary | ICD-10-CM | POA: Diagnosis not present

## 2022-11-09 DIAGNOSIS — D57219 Sickle-cell/Hb-C disease with crisis, unspecified: Secondary | ICD-10-CM

## 2022-11-09 DIAGNOSIS — D5720 Sickle-cell/Hb-C disease without crisis: Secondary | ICD-10-CM

## 2022-11-09 DIAGNOSIS — Z79891 Long term (current) use of opiate analgesic: Secondary | ICD-10-CM

## 2022-11-09 MED ORDER — OXYCODONE HCL 30 MG PO TABS
30.0000 mg | ORAL_TABLET | Freq: Four times a day (QID) | ORAL | 0 refills | Status: DC | PRN
Start: 2022-11-09 — End: 2023-04-01

## 2022-11-21 LAB — CMP14+CBC/D/PLT+FER+RETIC+V...
ALT: 21 [IU]/L (ref 0–44)
AST: 25 [IU]/L (ref 0–40)
Albumin: 4.3 g/dL (ref 4.1–5.1)
Alkaline Phosphatase: 93 [IU]/L (ref 44–121)
BUN/Creatinine Ratio: 9 (ref 9–20)
BUN: 13 mg/dL (ref 6–24)
Basophils Absolute: 0.1 10*3/uL (ref 0.0–0.2)
Basos: 1 %
Bilirubin Total: 1.2 mg/dL (ref 0.0–1.2)
CO2: 22 mmol/L (ref 20–29)
Calcium: 10.4 mg/dL — ABNORMAL HIGH (ref 8.7–10.2)
Chloride: 101 mmol/L (ref 96–106)
Creatinine, Ser: 1.42 mg/dL — ABNORMAL HIGH (ref 0.76–1.27)
EOS (ABSOLUTE): 0.4 10*3/uL (ref 0.0–0.4)
Eos: 3 %
Ferritin: 128 ng/mL (ref 30–400)
Globulin, Total: 2.6 g/dL (ref 1.5–4.5)
Glucose: 86 mg/dL (ref 70–99)
Hematocrit: 41 % (ref 37.5–51.0)
Hemoglobin: 14.4 g/dL (ref 13.0–17.7)
Immature Grans (Abs): 0 10*3/uL (ref 0.0–0.1)
Immature Granulocytes: 0 %
Lymphocytes Absolute: 4.2 10*3/uL — ABNORMAL HIGH (ref 0.7–3.1)
Lymphs: 32 %
MCH: 30.8 pg (ref 26.6–33.0)
MCHC: 35.1 g/dL (ref 31.5–35.7)
MCV: 88 fL (ref 79–97)
Monocytes Absolute: 1.6 10*3/uL — ABNORMAL HIGH (ref 0.1–0.9)
Monocytes: 12 %
NRBC: 1 % — ABNORMAL HIGH (ref 0–0)
Neutrophils Absolute: 6.7 10*3/uL (ref 1.4–7.0)
Neutrophils: 52 %
Platelets: 303 10*3/uL (ref 150–450)
Potassium: 4.3 mmol/L (ref 3.5–5.2)
RBC: 4.68 x10E6/uL (ref 4.14–5.80)
RDW: 15.7 % — ABNORMAL HIGH (ref 11.6–15.4)
Retic Ct Pct: 3.6 % — ABNORMAL HIGH (ref 0.6–2.6)
Sodium: 135 mmol/L (ref 134–144)
Total Protein: 6.9 g/dL (ref 6.0–8.5)
Vit D, 25-Hydroxy: 23.7 ng/mL — ABNORMAL LOW (ref 30.0–100.0)
WBC: 13 10*3/uL — ABNORMAL HIGH (ref 3.4–10.8)
eGFR: 62 mL/min/{1.73_m2} (ref >59)

## 2022-11-21 LAB — DRUG SCREEN 11 W/CONF, SE
Amphetamines, IA: NEGATIVE ng/mL
Barbiturates, IA: NEGATIVE ug/mL
Benzodiazepines, IA: NEGATIVE ng/mL
Cocaine & Metabolite, IA: POSITIVE ng/mL — AB
Ethyl Alcohol, Enz: NEGATIVE g/dL
Methadone, IA: NEGATIVE ng/mL
Opiates, IA: NEGATIVE ng/mL
Oxycodones, IA: NEGATIVE ng/mL
Phencyclidine, IA: NEGATIVE ng/mL
Propoxyphene, IA: NEGATIVE ng/mL
THC(Marijuana) Metabolite, IA: NEGATIVE ng/mL

## 2022-11-21 LAB — COCAINE,MS,WB/SP RFX
Benzoylecgonine: 22 ng/mL
Cocaine Confirmation: POSITIVE
Cocaine: NEGATIVE ng/mL

## 2022-11-22 ENCOUNTER — Telehealth: Payer: Self-pay | Admitting: Nurse Practitioner

## 2022-11-22 ENCOUNTER — Other Ambulatory Visit: Payer: Self-pay

## 2022-11-22 ENCOUNTER — Other Ambulatory Visit: Payer: Self-pay | Admitting: Nurse Practitioner

## 2022-11-22 DIAGNOSIS — Z716 Tobacco abuse counseling: Secondary | ICD-10-CM

## 2022-11-22 DIAGNOSIS — G894 Chronic pain syndrome: Secondary | ICD-10-CM

## 2022-11-22 DIAGNOSIS — D57219 Sickle-cell/Hb-C disease with crisis, unspecified: Secondary | ICD-10-CM

## 2022-11-22 MED ORDER — NICOTINE 21 MG/24HR TD PT24
21.0000 mg | MEDICATED_PATCH | Freq: Every day | TRANSDERMAL | 0 refills | Status: DC
Start: 2022-11-22 — End: 2023-05-06

## 2022-11-22 MED ORDER — IBUPROFEN 600 MG PO TABS: 600.0000 mg | ORAL_TABLET | Freq: Three times a day (TID) | ORAL | 0 refills | Status: DC | PRN

## 2022-11-22 NOTE — Telephone Encounter (Signed)
Attempted to contact patient through phone call with no answer and no voicemail.  The following MyChart message was sent to the patient:  Mr. Bredeson,   We tried to reach you by phone today, but were unable to get in touch with you. I wanted to let you know that you will no longer be prescribed controlled substances through our office. You have tested positive for cocaine twice in past 4 months. Per DEA regulations we can no longer prescribe you controlled substances. I will send in a prescription for Prescription ibuprofen.    Angus Seller, FNP-C

## 2022-11-22 NOTE — Telephone Encounter (Signed)
Please advise Kh 

## 2022-11-22 NOTE — Telephone Encounter (Signed)
Pt requesting a call back from [place] regarding the message sent to him in [place]

## 2022-11-23 ENCOUNTER — Ambulatory Visit (INDEPENDENT_AMBULATORY_CARE_PROVIDER_SITE_OTHER): Payer: Self-pay | Admitting: Family Medicine

## 2022-11-23 ENCOUNTER — Encounter: Payer: Self-pay | Admitting: Family Medicine

## 2022-11-23 VITALS — BP 154/96 | HR 92 | Wt 213.0 lb

## 2022-11-23 DIAGNOSIS — Z23 Encounter for immunization: Secondary | ICD-10-CM

## 2022-11-23 DIAGNOSIS — D57219 Sickle-cell/Hb-C disease with crisis, unspecified: Secondary | ICD-10-CM | POA: Diagnosis not present

## 2022-11-23 DIAGNOSIS — G894 Chronic pain syndrome: Secondary | ICD-10-CM | POA: Diagnosis not present

## 2022-11-23 DIAGNOSIS — F1410 Cocaine abuse, uncomplicated: Secondary | ICD-10-CM | POA: Diagnosis not present

## 2022-11-23 NOTE — Progress Notes (Signed)
Established Patient Office Visit  Subjective   Patient ID: Dean Webb, male    DOB: 1975-07-29  Age: 47 y.o. MRN: [id number]  Chief Complaint  Patient presents with   Medical Management of Chronic Issues    Dean Webb is a 47 year old male with a medical history significant for sickle cell disease, chronic pain syndrome, opiate dependence and tolerance, history of cocaine abuse, and anemia of chronic disease presents for medication management. Patient has chronic pain related to sickle cell disease.  His pain is mostly to his low back and lower extremities.  Patient recently underwent a urine drug screen that yielded positive results for cocaine.  Patient is requesting to restart his opiate pain medications at this time.  He denies any current illicit drug use.  Patient has not undergone drug rehabilitation. Currently, he denies any chest pain, shortness of breath, urinary symptoms, nausea, vomiting, or diarrhea.  Of note, patient appears somnolent during appointment, he states that he works the third shift job and presented directly after work.    Patient Active Problem List   Diagnosis Date Noted   Chronic pain syndrome 10/26/2022   Rhabdomyolysis 08/23/2022   Mild intermittent asthma without complication 07/17/2022   Sickle cell pain crisis (HCC) 06/28/2022   Pneumonia 06/27/2022   GERD (gastroesophageal reflux disease) 06/27/2022   Hypercalcemia 06/27/2022   Overweight (BMI 25.0-29.9) 06/15/2021   Seasonal allergies 06/15/2021   Vitamin D deficiency 02/23/2020   Tobacco dependence    Cocaine abuse (HCC) 08/08/2017   H. 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   OSA (obstructive sleep apnea) 04/22/2017   Asthma without status asthmaticus 04/22/2017   Essential hypertension 03/11/2016   Chronic prescription opiate use 03/11/2016   Precordial chest pain 03/08/2016   Past Medical History:   Diagnosis Date   Asthma    GERD (gastroesophageal reflux disease)    Hb-S/Hb-C disease (HCC) 1979   HTN (hypertension) 2008   Hypertension    Sickle cell anemia (HCC)    Sickle cell disease (HCC)    Past Surgical History:  Procedure Laterality Date   CHOLECYSTECTOMY     Social History   Tobacco Use   Smoking status: Every Day    Current packs/day: 0.50    Average packs/day: 0.5 packs/day for 17.0 years (8.5 ttl pk-yrs)    Types: Cigarettes   Smokeless tobacco: Never  Vaping Use   Vaping status: Never Used  Substance Use Topics   Alcohol use: Yes    Comment: Occasional drinker   Drug use: Not Currently    Types: Marijuana   Social History   Socioeconomic History   Marital status: Significant Other    Spouse name: Not on file   Number of children: Not on file   Years of education: Not on file   Highest education level: Not on file  Occupational History   Not on file  Tobacco Use   Smoking status: Every Day    Current packs/day: 0.50    Average packs/day: 0.5 packs/day for 17.0 years (8.5 ttl pk-yrs)    Types: Cigarettes   Smokeless tobacco: Never  Vaping Use   Vaping status: Never Used  Substance and Sexual Activity   Alcohol use: Yes    Comment: Occasional drinker   Drug use: Not Currently    Types: Marijuana   Sexual activity: Yes  Other Topics Concern   Not on file  Social History Narrative  Not on file   Social Determinants of Health   Financial Resource Strain: Not on file  Food Insecurity: No Food Insecurity (08/23/2022)   Hunger Vital Sign    Worried About Running Out of Food in the Last Year: Never true    Ran Out of Food in the Last Year: Never true  Transportation Needs: No Transportation Needs (08/23/2022)   PRAPARE - Administrator, Civil Service (Medical): No    Lack of Transportation (Non-Medical): No  Physical Activity: Not on file  Stress: Not on file  Social Connections: Not on file  Intimate Partner Violence: Not At  Risk (08/23/2022)   Humiliation, Afraid, Rape, and Kick questionnaire    Fear of Current or Ex-Partner: No    Emotionally Abused: No    Physically Abused: No    Sexually Abused: No   Family Status  Relation Name Status   Mother  Deceased   Father  Deceased   Sister  Alive   Daughter  Alive  No partnership data on file   Family History  Problem Relation Age of Onset   Sickle cell trait Mother    Hypertension Mother    Asthma Mother    Sickle cell trait Father    Congestive Heart Failure Father    Hypertension Sister    Sickle cell anemia Daughter    Hypertension Daughter    Diabetes Daughter       Review of Systems  Constitutional: Negative.   HENT: Negative.    Respiratory: Negative.    Cardiovascular: Negative.   Genitourinary: Negative.   Musculoskeletal:  Positive for back pain.  Skin: Negative.   Psychiatric/Behavioral: Negative.        Objective:     BP (!) 154/96   Pulse 92   Wt 213 lb (96.6 kg)   SpO2 99%   BMI 32.39 kg/m  BP Readings from Last 3 Encounters:  11/23/22 (!) 154/96  10/25/22 (!) 173/117  07/17/22 136/78   Wt Readings from Last 3 Encounters:  11/23/22 213 lb (96.6 kg)  10/25/22 211 lb (95.7 kg)  07/17/22 211 lb 12.8 oz (96.1 kg)      Physical Exam Eyes:     Pupils: Pupils are equal, round, and reactive to light.  Cardiovascular:     Rate and Rhythm: Normal rate and regular rhythm.     Pulses: Normal pulses.  Pulmonary:     Effort: Pulmonary effort is normal.  Abdominal:     General: Bowel sounds are normal.  Skin:    General: Skin is warm.  Neurological:     General: No focal deficit present.     Mental Status: Mental status is at baseline.  Psychiatric:        Mood and Affect: Mood normal.        Behavior: Behavior normal.        Thought Content: Thought content normal.        Judgment: Judgment normal.      No results found for any visits on 11/23/22.  Last CBC Lab Results  Component Value Date   WBC 13.0  (H) 11/09/2022   HGB 14.4 11/09/2022   HCT 41.0 11/09/2022   MCV 88 11/09/2022   MCH 30.8 11/09/2022   RDW 15.7 (H) 11/09/2022   PLT 303 11/09/2022   Last metabolic panel Lab Results  Component Value Date   GLUCOSE 86 11/09/2022   NA 135 11/09/2022   K 4.3 11/09/2022   CL 101 11/09/2022  CO2 22 11/09/2022   BUN 13 11/09/2022   CREATININE 1.42 (H) 11/09/2022   EGFR 62 11/09/2022   CALCIUM 10.4 (H) 11/09/2022   PHOS 2.8 08/24/2022   PROT 6.9 11/09/2022   ALBUMIN 4.3 11/09/2022   LABGLOB 2.6 11/09/2022   AGRATIO 1.4 07/17/2022   BILITOT 1.2 11/09/2022   ALKPHOS 93 11/09/2022   AST 25 11/09/2022   ALT 21 11/09/2022   ANIONGAP 9 08/25/2022   Last lipids Lab Results  Component Value Date   CHOL  11/06/2008    84        ATP III CLASSIFICATION:  <200     mg/dL   Desirable  161-096  mg/dL   Borderline High  >=045    mg/dL   High          HDL 31 (L) 11/06/2008   LDLCALC  11/06/2008    44        Total Cholesterol/HDL:CHD Risk Coronary Heart Disease Risk Table                     Men   Women  1/2 Average Risk   3.4   3.3  Average Risk       5.0   4.4  2 X Average Risk   9.6   7.1  3 X Average Risk  23.4   11.0        Use the calculated Patient Ratio above and the CHD Risk Table to determine the patient's CHD Risk.        ATP III CLASSIFICATION (LDL):  <100     mg/dL   Optimal  409-811  mg/dL   Near or Above                    Optimal  130-159  mg/dL   Borderline  914-782  mg/dL   High  >956     mg/dL   Very High   TRIG 44 21/30/8657   CHOLHDL 2.7 11/06/2008   Last hemoglobin A1c No results found for: "HGBA1C" Last thyroid functions Lab Results  Component Value Date   TSH 5.165 (H) 06/27/2022   Last vitamin D Lab Results  Component Value Date   VD25OH 23.7 (L) 11/09/2022   Last vitamin B12 and Folate No results found for: "VITAMINB12", "FOLATE"    The ASCVD Risk score (Arnett DK, et al., 2019) failed to calculate for the following reasons:    Cannot find a previous HDL lab   Cannot find a previous total cholesterol lab    Assessment & Plan:   Problem List Items Addressed This Visit       Other   Chronic pain syndrome   Relevant Orders   Drug Screen 10 W/Conf, Serum   Other Visit Diagnoses     Flu vaccine need    -  Primary   Relevant Orders   Flu vaccine trivalent PF, 6mos and older(Flulaval,Afluria,Fluarix,Fluzone)   Sickle-cell/Hb-C disease with crisis, unspecified (HCC)       Relevant Orders   Drug Screen 10 W/Conf, Serum     1. Chronic pain syndrome Reviewed PDMP substance reporting system prior to prescribing opiate medications. No inconsistencies noted.  Recommend buprenorphine therapy for future treatment of chronic pain syndrome.  Patient given written information.  Advised to contact office after reviewing information. - Drug Screen 10 W/Conf, Serum  2. Cocaine abuse (HCC) Denies current drug use. Refuses rehabilitation at this time Patient tested positive for cocaine on 11/09/2022.  3. Flu vaccine need  - Flu vaccine trivalent PF, 6mos and older(Flulaval,Afluria,Fluarix,Fluzone)  4. Sickle-cell/Hb-C disease with crisis, unspecified (HCC)  - Drug Screen 10 W/Conf, Serum  Nolon Nations  APRN, MSN, FNP-C Patient Care [facility] [address] [place], [place] [address] [phone]

## 2022-11-27 LAB — DRUG SCREEN 10 W/CONF, SERUM
Amphetamines, IA: NEGATIVE ng/mL
Barbiturates, IA: NEGATIVE ug/mL
Benzodiazepines, IA: NEGATIVE ng/mL
Cocaine & Metabolite, IA: NEGATIVE ng/mL
Methadone, IA: NEGATIVE ng/mL
Opiates, IA: NEGATIVE ng/mL
Oxycodones, IA: NEGATIVE ng/mL
Phencyclidine, IA: NEGATIVE ng/mL
Propoxyphene, IA: NEGATIVE ng/mL
THC(Marijuana) Metabolite, IA: NEGATIVE ng/mL

## 2022-11-28 ENCOUNTER — Other Ambulatory Visit: Payer: Self-pay

## 2022-11-28 DIAGNOSIS — G894 Chronic pain syndrome: Secondary | ICD-10-CM

## 2022-11-28 DIAGNOSIS — D57219 Sickle-cell/Hb-C disease with crisis, unspecified: Secondary | ICD-10-CM

## 2022-11-28 NOTE — Telephone Encounter (Signed)
Please advise KH 

## 2022-11-29 ENCOUNTER — Telehealth: Payer: Self-pay | Admitting: Family Medicine

## 2022-11-29 NOTE — Telephone Encounter (Signed)
Dean Webb is a 47 year old male with a medical history significant for sickle cell disease, chronic pain syndrome, opiate dependence and tolerance, and anemia of chronic disease presented on 11/23/2022 for medication management.  6 days ago drug screen completely negative 10 days ago drug screen positive for cocaine  Patient states that he has been taking prescribed opiates consistently, which contradicts his prescription refill trends. Patient receives prescriptions for opiates every 15 days, his last prescription was filled on 11/10/2022 for 15 days. Serum drug screen was obtained on 11/23/2022.  This clinic will not be able to prescribe opiates for this patient.  Patient given the option of starting buprenorphine for management of his chronic pain. He declined.  Informed patient that we will send information about buprenorphine through his my chart.  Will also review patient's chart with medical director and send a referral to pain management. Will continue to see patient for all of his primary care needs.   Nolon Nations  APRN, MSN, FNP-C Patient Care [facility] [address] [place], [place] [address] [phone]

## 2022-12-28 ENCOUNTER — Institutional Professional Consult (permissible substitution) (HOSPITAL_BASED_OUTPATIENT_CLINIC_OR_DEPARTMENT_OTHER): Payer: 59 | Admitting: Family

## 2022-12-28 NOTE — Progress Notes (Deleted)
Advanced Hypertension Clinic Initial Assessment:    Date:  12/28/2022   ID:  Dean Webb, DOB December 08, 1975, MRN [id number]  PCP:  Ivonne Andrew, NP  Cardiologist:  None  Nephrologist:  Referring MD: Ivonne Andrew, NP   CC: Hypertension  History of Present Illness:    Dean Webb is a 47 y.o. male with a hx of resistant hypertension, chronic pain, sickle cell disease, asthma, GERD here to establish care in the Advanced Hypertension Clinic.   CT 06/2017 normal adrenals.   *** cocaine  Dean Webb was diagnosed with hypertension ***. It has been {Blank single:19197::"easy","difficult","***"} to control. Blood pressure {Blank single:19197::"not checked routinely at home","checked with arm cuff at home","checked with wrist cuff at home","***"}. Readings have been ***. he reports tobacco use ***. Alcohol use ***. For exercise he ***. he eats {Blank multiple:19196::"***","at home","outside of the home"} and {Blank single:19197::"does","does not"} follow low sodium diet.   Previous antihypertensives:  Secondary Causes of Hypertension  Medications/Herbal: OCP, steroids, stimulants, antidepressants, weight loss medication, immune suppressants, NSAIDs, sympathomimetics, alcohol, caffeine, licorice, ginseng, St. John's wort, chemo  Sleep Apnea Renal artery stenosis Hyperaldosteronism Hyper/hypothyroidism Pheochromocytoma: palpitations, tachycardia, headache, diaphoresis (plasma metanephrines) Cushing's syndrome: Cushingoid facies, central obesity, proximal muscle weakness, and ecchymoses, adrenal incidentaloma (cortisol) Coarctation of the aorta  Past Medical History:  Diagnosis Date   Asthma    GERD (gastroesophageal reflux disease)    Hb-S/Hb-C disease (HCC) 1979   HTN (hypertension) 2008   Hypertension    Sickle cell anemia (HCC)    Sickle cell disease (HCC)     Past Surgical History:  Procedure Laterality Date   CHOLECYSTECTOMY      Current  Medications: No outpatient medications have been marked as taking for the 12/28/22 encounter (Appointment) with Alver Sorrow, NP.   Current Facility-Administered Medications for the 12/28/22 encounter (Appointment) with Alver Sorrow, NP  Medication   cloNIDine (CATAPRES) tablet 0.3 mg     Allergies:   Patient has no known allergies.   Social History   Socioeconomic History   Marital status: Significant Other    Spouse name: Not on file   Number of children: Not on file   Years of education: Not on file   Highest education level: Not on file  Occupational History   Not on file  Tobacco Use   Smoking status: Every Day    Current packs/day: 0.50    Average packs/day: 0.5 packs/day for 17.0 years (8.5 ttl pk-yrs)    Types: Cigarettes   Smokeless tobacco: Never  Vaping Use   Vaping status: Never Used  Substance and Sexual Activity   Alcohol use: Yes    Comment: Occasional drinker   Drug use: Not Currently    Types: Marijuana   Sexual activity: Yes  Other Topics Concern   Not on file  Social History Narrative   Not on file   Social Determinants of Health   Financial Resource Strain: Not on file  Food Insecurity: No Food Insecurity (08/23/2022)   Hunger Vital Sign    Worried About Running Out of Food in the Last Year: Never true    Ran Out of Food in the Last Year: Never true  Transportation Needs: No Transportation Needs (08/23/2022)   PRAPARE - Administrator, Civil Service (Medical): No    Lack of Transportation (Non-Medical): No  Physical Activity: Not on file  Stress: Not on file  Social Connections: Not on file  Family History: The patient's ***family history includes Asthma in his mother; Congestive Heart Failure in his father; Diabetes in his daughter; Hypertension in his daughter, mother, and sister; Sickle cell anemia in his daughter; Sickle cell trait in his father and mother.  ROS:   Please see the history of present illness.     *** All other systems reviewed and are negative.  EKGs/Labs/Other Studies Reviewed:    EKG:  EKG is *** ordered today.  The ekg ordered today demonstrates ***  Recent Labs: 06/27/2022: TSH 5.165 08/25/2022: Magnesium 2.0 11/09/2022: ALT 21; BUN 13; Creatinine, Ser 1.42; Hemoglobin 14.4; Platelets 303; Potassium 4.3; Sodium 135   Recent Lipid Panel    Component Value Date/Time   CHOL  11/06/2008 0610    84        ATP III CLASSIFICATION:  <200     mg/dL   Desirable  272-536  mg/dL   Borderline High  >=644    mg/dL   High          TRIG 44 11/06/2008 0610   HDL 31 (L) 11/06/2008 0610   CHOLHDL 2.7 11/06/2008 0610   VLDL 9 11/06/2008 0610   LDLCALC  11/06/2008 0610    44        Total Cholesterol/HDL:CHD Risk Coronary Heart Disease Risk Table                     Men   Women  1/2 Average Risk   3.4   3.3  Average Risk       5.0   4.4  2 X Average Risk   9.6   7.1  3 X Average Risk  23.4   11.0        Use the calculated Patient Ratio above and the CHD Risk Table to determine the patient's CHD Risk.        ATP III CLASSIFICATION (LDL):  <100     mg/dL   Optimal  034-742  mg/dL   Near or Above                    Optimal  130-159  mg/dL   Borderline  595-638  mg/dL   High  >756     mg/dL   Very High    Physical Exam:   VS:  There were no vitals taken for this visit. , BMI There is no height or weight on file to calculate BMI. GENERAL:  Well appearing HEENT: Pupils equal round and reactive, fundi not visualized, oral mucosa unremarkable NECK:  No jugular venous distention, waveform within normal limits, carotid upstroke brisk and symmetric, no bruits, no thyromegaly LYMPHATICS:  No cervical adenopathy LUNGS:  Clear to auscultation bilaterally HEART:  RRR.  PMI not displaced or sustained,S1 and S2 within normal limits, no S3, no S4, no clicks, no rubs, *** murmurs ABD:  Flat, positive bowel sounds normal in frequency in pitch, no bruits, no rebound, no guarding, no midline  pulsatile mass, no hepatomegaly, no splenomegaly EXT:  2 plus pulses throughout, no edema, no cyanosis no clubbing SKIN:  No rashes no nodules NEURO:  Cranial nerves II through XII grossly intact, motor grossly intact throughout PSYCH:  Cognitively intact, oriented to person place and time   ASSESSMENT/PLAN:    No problem-specific Assessment & Plan notes found for this encounter.   Screening for Secondary Hypertension: { Click here to document screening for secondary causes of HTN  :[id number]}    Relevant Labs/Studies:  Latest Ref Rng & Units 11/09/2022    2:16 PM 08/25/2022    3:54 AM 08/24/2022    3:21 AM  Basic Labs  Sodium 134 - 144 mmol/L 135  134  136   Potassium 3.5 - 5.2 mmol/L 4.3  3.8  4.3   Creatinine 0.76 - 1.27 mg/dL 4.09  8.11  9.14        Latest Ref Rng & Units 06/27/2022    8:49 PM  Thyroid   TSH 0.350 - 4.500 uIU/mL 5.165                     he consents to be monitored in our remote patient monitoring program through [employer].  he will track his blood pressure twice daily and understands that these trends will help [place] to adjust his medications as needed prior to his next appointment.  he *** interested in enrolling in the PREP exercise and nutrition program through the [facility].     Disposition:    FU with MD/PharmD in {gen number 7-82:[id number]} {Days to years:10300}    Medication Adjustments/Labs and Tests Ordered: Current medicines are reviewed at length with the patient today.  Concerns regarding medicines are outlined above.  No orders of the defined types were placed in this encounter.  No orders of the defined types were placed in this encounter.    Signed, Alver Sorrow, NP  12/28/2022 1:28 PM    [facility]

## 2022-12-29 ENCOUNTER — Telehealth: Payer: Self-pay | Admitting: Nurse Practitioner

## 2022-12-29 NOTE — Telephone Encounter (Signed)
Pt called asking about his medication. Says when he was in here to see Archie Patten last "she was going to ask someone about his medication".

## 2023-01-01 ENCOUNTER — Other Ambulatory Visit: Payer: Self-pay | Admitting: Nurse Practitioner

## 2023-01-01 ENCOUNTER — Telehealth: Payer: Self-pay | Admitting: Nurse Practitioner

## 2023-01-01 MED ORDER — IBUPROFEN 600 MG PO TABS: 600.0000 mg | ORAL_TABLET | Freq: Three times a day (TID) | ORAL | 0 refills | Status: DC | PRN

## 2023-01-01 NOTE — Telephone Encounter (Signed)
Pt called asking about his medication again, I let him know of your other message where you said he has already spoken with [place] and that you can only send in ibuprofen. He said you were suppose to speak to Dr. Hyman Hopes concerning this

## 2023-01-23 ENCOUNTER — Encounter (HOSPITAL_BASED_OUTPATIENT_CLINIC_OR_DEPARTMENT_OTHER): Payer: Self-pay

## 2023-01-24 ENCOUNTER — Ambulatory Visit: Payer: Self-pay | Admitting: Nurse Practitioner

## 2023-01-25 ENCOUNTER — Institutional Professional Consult (permissible substitution) (HOSPITAL_BASED_OUTPATIENT_CLINIC_OR_DEPARTMENT_OTHER): Payer: 59 | Admitting: Family

## 2023-03-21 ENCOUNTER — Ambulatory Visit: Payer: Self-pay | Admitting: Nurse Practitioner

## 2023-04-01 ENCOUNTER — Emergency Department (HOSPITAL_COMMUNITY): Payer: 59

## 2023-04-01 ENCOUNTER — Other Ambulatory Visit: Payer: Self-pay

## 2023-04-01 ENCOUNTER — Observation Stay (HOSPITAL_COMMUNITY)
Admission: EM | Admit: 2023-04-01 | Discharge: 2023-04-03 | Disposition: A | Discharge status: Home / Self Care | Payer: 59 | Attending: Internal Medicine | Admitting: Internal Medicine

## 2023-04-01 DIAGNOSIS — I129 Hypertensive chronic kidney disease with stage 1 through stage 4 chronic kidney disease, or unspecified chronic kidney disease: Secondary | ICD-10-CM | POA: Insufficient documentation

## 2023-04-01 DIAGNOSIS — J45909 Unspecified asthma, uncomplicated: Secondary | ICD-10-CM | POA: Diagnosis not present

## 2023-04-01 DIAGNOSIS — Z716 Tobacco abuse counseling: Secondary | ICD-10-CM | POA: Diagnosis not present

## 2023-04-01 DIAGNOSIS — G4733 Obstructive sleep apnea (adult) (pediatric): Secondary | ICD-10-CM | POA: Diagnosis not present

## 2023-04-01 DIAGNOSIS — U071 COVID-19: Secondary | ICD-10-CM | POA: Diagnosis not present

## 2023-04-01 DIAGNOSIS — K7200 Acute and subacute hepatic failure without coma: Secondary | ICD-10-CM | POA: Insufficient documentation

## 2023-04-01 DIAGNOSIS — D57219 Sickle-cell/Hb-C disease with crisis, unspecified: Secondary | ICD-10-CM | POA: Diagnosis not present

## 2023-04-01 DIAGNOSIS — Z79899 Other long term (current) drug therapy: Secondary | ICD-10-CM | POA: Diagnosis not present

## 2023-04-01 DIAGNOSIS — F1410 Cocaine abuse, uncomplicated: Secondary | ICD-10-CM | POA: Insufficient documentation

## 2023-04-01 DIAGNOSIS — F17210 Nicotine dependence, cigarettes, uncomplicated: Secondary | ICD-10-CM | POA: Diagnosis not present

## 2023-04-01 DIAGNOSIS — N182 Chronic kidney disease, stage 2 (mild): Secondary | ICD-10-CM | POA: Diagnosis not present

## 2023-04-01 DIAGNOSIS — D5700 Hb-SS disease with crisis, unspecified: Secondary | ICD-10-CM | POA: Diagnosis not present

## 2023-04-01 DIAGNOSIS — R059 Cough, unspecified: Secondary | ICD-10-CM | POA: Diagnosis present

## 2023-04-01 LAB — RETICULOCYTES
Immature Retic Fract: 13.3 % (ref 2.3–15.9)
RBC.: 4.94 MIL/uL (ref 4.22–5.81)
Retic Count, Absolute: 170 10*3/uL (ref 19.0–186.0)
Retic Ct Pct: 3.4 % — ABNORMAL HIGH (ref 0.4–3.1)

## 2023-04-01 LAB — RAPID URINE DRUG SCREEN, HOSP PERFORMED
Amphetamines: NOT DETECTED
Barbiturates: NOT DETECTED
Benzodiazepines: NOT DETECTED
Cocaine: POSITIVE — AB
Opiates: POSITIVE — AB
Tetrahydrocannabinol: NOT DETECTED

## 2023-04-01 LAB — COMPREHENSIVE METABOLIC PANEL
ALT: 47 U/L — ABNORMAL HIGH (ref 0–44)
AST: 71 U/L — ABNORMAL HIGH (ref 15–41)
Albumin: 4.1 g/dL (ref 3.5–5.0)
Alkaline Phosphatase: 83 U/L (ref 38–126)
Anion gap: 13 (ref 5–15)
BUN: 9 mg/dL (ref 6–20)
CO2: 19 mmol/L — ABNORMAL LOW (ref 22–32)
Calcium: 10.9 mg/dL — ABNORMAL HIGH (ref 8.9–10.3)
Chloride: 102 mmol/L (ref 98–111)
Creatinine, Ser: 1.26 mg/dL — ABNORMAL HIGH (ref 0.61–1.24)
GFR, Estimated: >60 mL/min (ref >60)
Glucose, Bld: 158 mg/dL — ABNORMAL HIGH (ref 70–99)
Potassium: 3.5 mmol/L (ref 3.5–5.1)
Sodium: 134 mmol/L — ABNORMAL LOW (ref 135–145)
Total Bilirubin: 1.8 mg/dL — ABNORMAL HIGH (ref 0.0–1.2)
Total Protein: 7.5 g/dL (ref 6.5–8.1)

## 2023-04-01 LAB — CBC WITH DIFFERENTIAL/PLATELET
Abs Immature Granulocytes: 0.05 10*3/uL (ref 0.00–0.07)
Basophils Absolute: 0.1 10*3/uL (ref 0.0–0.1)
Basophils Relative: 1 %
Eosinophils Absolute: 0 10*3/uL (ref 0.0–0.5)
Eosinophils Relative: 0 %
HCT: 40.7 % (ref 39.0–52.0)
Hemoglobin: 14.8 g/dL (ref 13.0–17.0)
Immature Granulocytes: 1 %
Lymphocytes Relative: 11 %
Lymphs Abs: 1 10*3/uL (ref 0.7–4.0)
MCH: 29.8 pg (ref 26.0–34.0)
MCHC: 36.4 g/dL — ABNORMAL HIGH (ref 30.0–36.0)
MCV: 81.9 fL (ref 80.0–100.0)
Monocytes Absolute: 1.8 10*3/uL — ABNORMAL HIGH (ref 0.1–1.0)
Monocytes Relative: 20 %
Neutro Abs: 5.9 10*3/uL (ref 1.7–7.7)
Neutrophils Relative %: 67 %
Platelets: 301 10*3/uL (ref 150–400)
RBC: 4.97 MIL/uL (ref 4.22–5.81)
RDW: 15.2 % (ref 11.5–15.5)
WBC: 8.8 10*3/uL (ref 4.0–10.5)
nRBC: 1.9 % — ABNORMAL HIGH (ref 0.0–0.2)

## 2023-04-01 LAB — RESP PANEL BY RT-PCR (RSV, FLU A&B, COVID)  RVPGX2
Influenza A by PCR: NEGATIVE
Influenza B by PCR: NEGATIVE
Resp Syncytial Virus by PCR: NEGATIVE
SARS Coronavirus 2 by RT PCR: POSITIVE — AB

## 2023-04-01 LAB — TROPONIN I (HIGH SENSITIVITY): Troponin I (High Sensitivity): 12 ng/L (ref <18)

## 2023-04-01 MED ORDER — OXYCODONE HCL 15 MG PO TABS: 15.0000 mg | ORAL_TABLET | Freq: Three times a day (TID) | ORAL | 0 refills | Status: DC | PRN

## 2023-04-01 MED ORDER — AMLODIPINE BESYLATE 5 MG PO TABS
5.0000 mg | ORAL_TABLET | Freq: Every day | ORAL | Status: DC
  Administered 2023-04-02 – 2023-04-03 (×2): 5 mg via ORAL
  Filled 2023-04-01 (×2): qty 1

## 2023-04-01 MED ORDER — BENZONATATE 100 MG PO CAPS: 100.0000 mg | ORAL_CAPSULE | Freq: Three times a day (TID) | ORAL | 0 refills | Status: DC | PRN

## 2023-04-01 MED ORDER — OXYCODONE HCL 5 MG PO TABS
15.0000 mg | ORAL_TABLET | Freq: Four times a day (QID) | ORAL | Status: DC
  Administered 2023-04-01 – 2023-04-03 (×8): 15 mg via ORAL
  Filled 2023-04-01 (×8): qty 3

## 2023-04-01 MED ORDER — GUAIFENESIN-DM 100-10 MG/5ML PO SYRP: 5.0000 mL | ORAL_SOLUTION | ORAL | Status: DC | PRN

## 2023-04-01 MED ORDER — OXYCODONE HCL 5 MG PO TABS
15.0000 mg | ORAL_TABLET | Freq: Once | ORAL | Status: AC
  Administered 2023-04-01: 15 mg via ORAL
  Filled 2023-04-01: qty 3

## 2023-04-01 MED ORDER — NICOTINE POLACRILEX 2 MG MT GUM: 2.0000 mg | CHEWING_GUM | OROMUCOSAL | Status: DC | PRN

## 2023-04-01 MED ORDER — NIRMATRELVIR/RITONAVIR (PAXLOVID)TABLET
3.0000 | ORAL_TABLET | Freq: Two times a day (BID) | ORAL | Status: DC
  Administered 2023-04-02 – 2023-04-03 (×3): 3 via ORAL
  Filled 2023-04-01: qty 30

## 2023-04-01 MED ORDER — ONDANSETRON HCL 4 MG/2ML IJ SOLN
4.0000 mg | Freq: Four times a day (QID) | INTRAMUSCULAR | Status: DC | PRN
  Administered 2023-04-02 (×4): 4 mg via INTRAVENOUS
  Filled 2023-04-01 (×4): qty 2

## 2023-04-01 MED ORDER — ALBUTEROL SULFATE (2.5 MG/3ML) 0.083% IN NEBU: 2.5000 mg | INHALATION_SOLUTION | RESPIRATORY_TRACT | Status: DC | PRN

## 2023-04-01 MED ORDER — OXYCODONE HCL 5 MG PO TABS
15.0000 mg | ORAL_TABLET | Freq: Four times a day (QID) | ORAL | Status: DC | PRN
  Administered 2023-04-02: 15 mg via ORAL
  Filled 2023-04-01: qty 3

## 2023-04-01 MED ORDER — PAXLOVID (300/100) 20 X 150 MG & 10 X 100MG PO TBPK: 3.0000 | ORAL_TABLET | Freq: Two times a day (BID) | ORAL | 0 refills | Status: DC

## 2023-04-01 MED ORDER — LIDOCAINE 5 % EX PTCH
2.0000 | MEDICATED_PATCH | CUTANEOUS | Status: DC
  Administered 2023-04-01 – 2023-04-02 (×2): 2 via TRANSDERMAL
  Filled 2023-04-01 (×2): qty 2

## 2023-04-01 MED ORDER — OXYCODONE HCL 5 MG PO TABS: 7.5000 mg | ORAL_TABLET | Freq: Four times a day (QID) | ORAL | Status: DC | PRN

## 2023-04-01 MED ORDER — POLYETHYLENE GLYCOL 3350 17 G PO PACK
17.0000 g | PACK | Freq: Every day | ORAL | Status: DC
  Administered 2023-04-01 – 2023-04-03 (×2): 17 g via ORAL
  Filled 2023-04-01 (×3): qty 1

## 2023-04-01 MED ORDER — ACETAMINOPHEN 500 MG PO TABS
1000.0000 mg | ORAL_TABLET | Freq: Once | ORAL | Status: AC
  Administered 2023-04-01: 1000 mg via ORAL
  Filled 2023-04-01: qty 2

## 2023-04-01 MED ORDER — SODIUM CHLORIDE 0.9 % IV BOLUS
1000.0000 mL | Freq: Once | INTRAVENOUS | Status: AC
  Administered 2023-04-01: 1000 mL via INTRAVENOUS

## 2023-04-01 MED ORDER — HYDROMORPHONE HCL 1 MG/ML IJ SOLN
0.5000 mg | INTRAMUSCULAR | Status: DC | PRN
  Administered 2023-04-01 – 2023-04-02 (×2): 0.5 mg via INTRAVENOUS
  Filled 2023-04-01: qty 0.5
  Filled 2023-04-01: qty 1
  Filled 2023-04-01: qty 0.5

## 2023-04-01 MED ORDER — ENOXAPARIN SODIUM 40 MG/0.4ML IJ SOSY
40.0000 mg | PREFILLED_SYRINGE | INTRAMUSCULAR | Status: DC
  Administered 2023-04-01 – 2023-04-02 (×2): 40 mg via SUBCUTANEOUS
  Filled 2023-04-01 (×2): qty 0.4

## 2023-04-01 MED ORDER — MORPHINE SULFATE (PF) 4 MG/ML IV SOLN
4.0000 mg | Freq: Once | INTRAVENOUS | Status: AC
  Administered 2023-04-01: 4 mg via INTRAMUSCULAR
  Filled 2023-04-01: qty 1

## 2023-04-01 MED ORDER — MOLNUPIRAVIR EUA 200MG CAPSULE: 4.0000 | ORAL_CAPSULE | Freq: Two times a day (BID) | ORAL | 0 refills | Status: AC

## 2023-04-01 MED ORDER — HYDROMORPHONE HCL 1 MG/ML IJ SOLN
1.0000 mg | Freq: Once | INTRAMUSCULAR | Status: AC
  Administered 2023-04-01: 1 mg via INTRAMUSCULAR
  Filled 2023-04-01: qty 1

## 2023-04-01 MED ORDER — FOLIC ACID 1 MG PO TABS
1.0000 mg | ORAL_TABLET | Freq: Every day | ORAL | Status: DC
  Administered 2023-04-01 – 2023-04-03 (×3): 1 mg via ORAL
  Filled 2023-04-01 (×3): qty 1

## 2023-04-01 MED ORDER — NICOTINE 7 MG/24HR TD PT24
7.0000 mg | MEDICATED_PATCH | Freq: Every day | TRANSDERMAL | Status: DC
  Administered 2023-04-01 – 2023-04-03 (×3): 7 mg via TRANSDERMAL
  Filled 2023-04-01 (×3): qty 1

## 2023-04-01 MED ORDER — ACETAMINOPHEN 500 MG PO TABS: 1000.0000 mg | ORAL_TABLET | Freq: Four times a day (QID) | ORAL | Status: DC | PRN

## 2023-04-01 NOTE — ED Notes (Signed)
Messaged provider  patient states his pain hasn't been helped and is not ready to go, would like to see provider

## 2023-04-01 NOTE — ED Notes (Signed)
ED TO INPATIENT HANDOFF REPORT  Name/Age/Gender Dean Webb 48 y.o. male  Code Status    Code Status Orders  (From admission, onward)           Start     Ordered   04/01/23 1937  Full code  Continuous       Question:  By:  Answer:  Other   04/01/23 1943           Code Status History     Date Active Date Inactive Code Status Order ID Comments User Context   08/23/2022 2219 08/25/2022 1614 Full Code [id number]  Darlin Drop, DO ED   11/27/2019 0025 12/02/2019 2024 Full Code [id number]  Charlsie Quest, MD ED   08/08/2017 2118 08/11/2017 1535 Full Code [id number]  Hillary Bow, DO ED   07/11/2017 0955 07/13/2017 1554 Full Code [id number]  Rometta Emery, MD Inpatient   04/30/2017 0020 05/04/2017 1544 Full Code [id number]  Eduard Clos, MD Inpatient   04/22/2017 1518 04/26/2017 1454 Full Code [id number]  Altha Harm, MD Inpatient   03/08/2016 1801 03/11/2016 1734 Full Code [id number]  Altha Harm, MD Inpatient       Home/SNF/Other Home  Chief Complaint 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]  Level of Care/Admitting Diagnosis ED Disposition     ED Disposition  Admit   Condition  --   Comment  Hospital Area: [facility] [[id number]]  Level of Care: Med-Surg [16]  May place patient in observation at [facility] or [facility] if equivalent level of care is available:: No  Covid Evaluation: Confirmed COVID Positive  Diagnosis: Vasoocclusive sickle cell crisis [facility]) [[id number]]  Admitting Physician: Dolly Rias [[id number]]  Attending Physician: Dolly Rias [[id number]]          Medical History Past Medical History:  Diagnosis Date   Asthma    GERD (gastroesophageal reflux disease)    Hb-S/Hb-C disease (HCC) 1979   HTN (hypertension) 2008   Hypertension    Sickle cell anemia (HCC)    Sickle cell disease (HCC)     Allergies No Known Allergies  IV Location/Drains/Wounds Patient Lines/Drains/Airways  Status     Active Line/Drains/Airways     Name Placement date Placement time Site Days   Peripheral IV 04/01/23 20 G 1" Left Antecubital 04/01/23  2104  Antecubital  less than 1            Labs/Imaging Results for orders placed or performed during the hospital encounter of 04/01/23 (from the past 48 hours)  Comprehensive metabolic panel     Status: Abnormal   Collection Time: 04/01/23  3:41 PM  Result Value Ref Range   Sodium 134 (L) 135 - 145 mmol/L   Potassium 3.5 3.5 - 5.1 mmol/L   Chloride 102 98 - 111 mmol/L   CO2 19 (L) 22 - 32 mmol/L   Glucose, Bld 158 (H) 70 - 99 mg/dL    Comment: Glucose reference range applies only to samples taken after fasting for at least 8 hours.   BUN 9 6 - 20 mg/dL   Creatinine, Ser 5.57 (H) 0.61 - 1.24 mg/dL   Calcium 32.2 (H) 8.9 - 10.3 mg/dL   Total Protein 7.5 6.5 - 8.1 g/dL   Albumin 4.1 3.5 - 5.0 g/dL   AST 71 (H) 15 - 41 U/L   ALT 47 (H) 0 - 44 U/L   Alkaline Phosphatase 83 38 - 126 U/L   Total Bilirubin 1.8 (H)  0.0 - 1.2 mg/dL   GFR, Estimated >45 >40 mL/min    Comment: (NOTE) Calculated using the CKD-EPI Creatinine Equation (2021)    Anion gap 13 5 - 15    Comment: Performed at [facility], [address], [place], [place] [address]  CBC with Differential     Status: Abnormal   Collection Time: 04/01/23  3:41 PM  Result Value Ref Range   WBC 8.8 4.0 - 10.5 K/uL   RBC 4.97 4.22 - 5.81 MIL/uL   Hemoglobin 14.8 13.0 - 17.0 g/dL   HCT 14.7 82.9 - 56.2 %   MCV 81.9 80.0 - 100.0 fL   MCH 29.8 26.0 - 34.0 pg   MCHC 36.4 (H) 30.0 - 36.0 g/dL   RDW 13.0 86.5 - 78.4 %   Platelets 301 150 - 400 K/uL   nRBC 1.9 (H) 0.0 - 0.2 %   Neutrophils Relative % 67 %   Neutro Abs 5.9 1.7 - 7.7 K/uL   Lymphocytes Relative 11 %   Lymphs Abs 1.0 0.7 - 4.0 K/uL   Monocytes Relative 20 %   Monocytes Absolute 1.8 (H) 0.1 - 1.0 K/uL   Eosinophils Relative 0 %   Eosinophils Absolute 0.0 0.0 - 0.5 K/uL   Basophils Relative 1 %    Basophils Absolute 0.1 0.0 - 0.1 K/uL   Immature Granulocytes 1 %   Abs Immature Granulocytes 0.05 0.00 - 0.07 K/uL    Comment: Performed at [facility], [address] [place], [place] [address]  Reticulocytes     Status: Abnormal   Collection Time: 04/01/23  3:41 PM  Result Value Ref Range   Retic Ct Pct 3.4 (H) 0.4 - 3.1 %    Comment: REPEATED TO VERIFY CONFIRMED BY MANUAL DILUTION    RBC. 4.94 4.22 - 5.81 MIL/uL   Retic Count, Absolute 170.0 19.0 - 186.0 K/uL   Immature Retic Fract 13.3 2.3 - 15.9 %    Comment: Performed at [facility], [address] [place], [place] [address]  Resp panel by RT-PCR (RSV, Flu A&B, Covid) Anterior Nasal Swab     Status: Abnormal   Collection Time: 04/01/23  3:41 PM   Specimen: Anterior Nasal Swab  Result Value Ref Range   SARS Coronavirus 2 by RT PCR POSITIVE (A) NEGATIVE    Comment: (NOTE) SARS-CoV-2 target nucleic acids are DETECTED.  The SARS-CoV-2 RNA is generally detectable in upper respiratory specimens during the acute phase of infection. Positive results are indicative of the presence of the identified virus, but do not rule out bacterial infection or co-infection with other pathogens not detected by the test. Clinical correlation with patient history and other diagnostic information is necessary to determine patient infection status. The expected result is Negative.  Fact Sheet for Patients: BloggerCourse.com  Fact Sheet for Healthcare Providers: SeriousBroker.it  This test is not yet approved or cleared by the [place] FDA and  has been authorized for detection and/or diagnosis of SARS-CoV-2 by FDA under an Emergency Use Authorization (EUA).  This EUA will remain in effect (meaning this test can be used) for the duration of  the COVID-19 declaration under Section 564(b)(1) of the A ct, 21 U.S.C. section 360bbb-3(b)(1), unless the  authorization is terminated or revoked sooner.     Influenza A by PCR NEGATIVE NEGATIVE   Influenza B by PCR NEGATIVE NEGATIVE    Comment: (NOTE) The Xpert Xpress SARS-CoV-2/FLU/RSV plus assay is intended as an aid  in the diagnosis of influenza from Nasopharyngeal swab specimens and should not be used as a sole basis for treatment. Nasal washings and aspirates are unacceptable for Xpert Xpress SARS-CoV-2/FLU/RSV testing.  Fact Sheet for Patients: BloggerCourse.com  Fact Sheet for Healthcare Providers: SeriousBroker.it  This test is not yet approved or cleared by the [place] FDA and has been authorized for detection and/or diagnosis of SARS-CoV-2 by FDA under an Emergency Use Authorization (EUA). This EUA will remain in effect (meaning this test can be used) for the duration of the COVID-19 declaration under Section 564(b)(1) of the Act, 21 U.S.C. section 360bbb-3(b)(1), unless the authorization is terminated or revoked.     Resp Syncytial Virus by PCR NEGATIVE NEGATIVE    Comment: (NOTE) Fact Sheet for Patients: BloggerCourse.com  Fact Sheet for Healthcare Providers: SeriousBroker.it  This test is not yet approved or cleared by the [place] FDA and has been authorized for detection and/or diagnosis of SARS-CoV-2 by FDA under an Emergency Use Authorization (EUA). This EUA will remain in effect (meaning this test can be used) for the duration of the COVID-19 declaration under Section 564(b)(1) of the Act, 21 U.S.C. section 360bbb-3(b)(1), unless the authorization is terminated or revoked.  Performed at [facility], [address], [place], [place] [address]   Troponin I (High Sensitivity)     Status: None   Collection Time: 04/01/23  3:41 PM  Result Value Ref Range   Troponin I (High Sensitivity) 12 <18 ng/L    Comment: (NOTE) Elevated high  sensitivity troponin I (hsTnI) values and significant  changes across serial measurements may suggest ACS but many other  chronic and acute conditions are known to elevate hsTnI results.  Refer to the "Links" section for chest pain algorithms and additional  guidance. Performed at [facility], [address] [place], [place] [address]   Rapid urine drug screen (hospital performed)     Status: Abnormal   Collection Time: 04/01/23  7:43 PM  Result Value Ref Range   Opiates POSITIVE (A) NONE DETECTED   Cocaine POSITIVE (A) NONE DETECTED   Benzodiazepines NONE DETECTED NONE DETECTED   Amphetamines NONE DETECTED NONE DETECTED   Tetrahydrocannabinol NONE DETECTED NONE DETECTED   Barbiturates NONE DETECTED NONE DETECTED    Comment: (NOTE) DRUG SCREEN FOR MEDICAL PURPOSES ONLY.  IF CONFIRMATION IS NEEDED FOR ANY PURPOSE, NOTIFY LAB WITHIN 5 DAYS.  LOWEST DETECTABLE LIMITS FOR URINE DRUG SCREEN Drug Class                     Cutoff (ng/mL) Amphetamine and metabolites    1000 Barbiturate and metabolites    200 Benzodiazepine                 200 Opiates and metabolites        300 Cocaine and metabolites        300 THC                            50 Performed at [facility], [address] [place], [place] [address]    DG Chest 2 View Result Date: 04/01/2023 CLINICAL DATA:  Chest pain. EXAM: CHEST - 2 VIEW COMPARISON:  08/23/2022. FINDINGS: Bilateral lung fields are clear. Bilateral costophrenic angles are clear. Normal cardio-mediastinal silhouette. No acute osseous abnormalities. Old healed right middle third clavicular fracture noted. The soft tissues are within normal limits. IMPRESSION: No active  cardiopulmonary disease. Electronically Signed   By: Jules Schick M.D.   On: 04/01/2023 16:58    Pending Labs Unresulted Labs (From admission, onward)     Start     Ordered   04/02/23 0500  Basic metabolic panel  Tomorrow morning,   R         04/01/23 1943   04/02/23 0500  CBC  Tomorrow morning,   R        04/01/23 1943   04/02/23 0500  Magnesium  Tomorrow morning,   R        04/01/23 1943   04/02/23 0500  Phosphorus  Tomorrow morning,   R        04/01/23 1943   04/02/23 0500  Hepatic function panel  Tomorrow morning,   R        04/01/23 1946   04/02/23 0500  Hepatitis B core antibody, total  Tomorrow morning,   R        04/01/23 2024   04/02/23 0500  Hepatitis B surface antibody,qualitative  Tomorrow morning,   R        04/01/23 2024   04/02/23 0500  Hepatitis B surface antigen  Tomorrow morning,   R        04/01/23 2024   04/02/23 0500  Hepatitis C antibody  Tomorrow morning,   R        04/01/23 2024   04/01/23 1940  Ethanol  Add-on,   AD        04/01/23 1943            Vitals/Pain Today's Vitals   04/01/23 1807 04/01/23 1849 04/01/23 1913 04/01/23 2056  BP: (!) 158/110   (!) 151/114  Pulse: 95   70  Resp: 16   18  Temp:   98.5 F (36.9 C)   TempSrc:   Oral   SpO2: 98%   98%  PainSc:  8   8     Isolation Precautions Airborne and Contact precautions  Medications Medications  enoxaparin (LOVENOX) injection 40 mg (40 mg Subcutaneous Given 04/01/23 2112)  oxyCODONE (Oxy IR/ROXICODONE) immediate release tablet 15 mg (15 mg Oral Given 04/01/23 2057)  oxyCODONE (Oxy IR/ROXICODONE) immediate release tablet 7.5 mg (has no administration in time range)    Or  oxyCODONE (Oxy IR/ROXICODONE) immediate release tablet 15 mg (has no administration in time range)  HYDROmorphone (DILAUDID) injection 0.5 mg (has no administration in time range)  polyethylene glycol (MIRALAX / GLYCOLAX) packet 17 g (17 g Oral Given 04/01/23 2111)  albuterol (PROVENTIL) (2.5 MG/3ML) 0.083% nebulizer solution 2.5 mg (has no administration in time range)  nirmatrelvir/ritonavir (PAXLOVID) 3 tablet (has no administration in time range)  acetaminophen (TYLENOL) tablet 1,000 mg (has no administration in time range)  lidocaine (LIDODERM) 5 % 2  patch (2 patches Transdermal Patch Applied 04/01/23 2116)  folic acid (FOLVITE) tablet 1 mg (1 mg Oral Given 04/01/23 2111)  nicotine (NICODERM CQ - dosed in mg/24 hr) patch 7 mg (7 mg Transdermal Patch Applied 04/01/23 2058)  nicotine polacrilex (NICORETTE) gum 2 mg (has no administration in time range)  amLODipine (NORVASC) tablet 5 mg (has no administration in time range)  oxyCODONE (Oxy IR/ROXICODONE) immediate release tablet 15 mg (15 mg Oral Given 04/01/23 1652)  acetaminophen (TYLENOL) tablet 1,000 mg (1,000 mg Oral Given 04/01/23 1742)  morphine (PF) 4 MG/ML injection 4 mg (4 mg Intramuscular Given 04/01/23 1745)  HYDROmorphone (DILAUDID) injection 1 mg (1 mg Intramuscular Given 04/01/23 1849)  sodium chloride 0.9 % bolus 1,000 mL (1,000 mLs Intravenous New Bag/Given 04/01/23 2105)    Mobility walks

## 2023-04-01 NOTE — ED Triage Notes (Signed)
Pt arrives with c/o sickle cell pain all over, also cough and chills.

## 2023-04-01 NOTE — ED Provider Notes (Signed)
[place] EMERGENCY DEPARTMENT AT [facility] Provider Note   CSN: [id number] Arrival date & time: 04/01/23  1513     History  Chief Complaint  Patient presents with   Sickle Cell Pain Crisis   Chills   Cough    Dean Webb is a 48 y.o. male.   Sickle Cell Pain Crisis Associated symptoms: cough   Cough   48 year old male presents emergency department with complaints of bodyaches, chills, cough, nasal congestion, sore throat.  Patient reports symptoms beginning yesterday.  Reports history of sickle cell pain states Dean Webb usually gets pain in his lower extremities but it feels like Dean Webb has a viral illness.  Reports some chest tightness associated as well.  States Dean Webb has a history of asthma and feels like that is flared up as well.  Denies any fever, abdominal pain, nausea, vomiting, urinary symptoms, change in bowel habits.  States that Dean Webb is typically on oxycodone but has been out of it for the past few weeks.  States Dean Webb has upcoming appointment this coming week for refill of his at home pain medication.  Has taken nothing for his discomfort.  Past medical history significant for sickle cell anemia, GERD, asthma, hypertension, OSA, cocaine abuse, rhabdomyolysis  Home Medications Prior to Admission medications   Medication Sig Start Date End Date Taking? Authorizing Provider  benzonatate (TESSALON) 100 MG capsule Take 1 capsule (100 mg total) by mouth 3 (three) times daily as needed for cough. 04/01/23  Yes Sherian Maroon A, PA  molnupiravir EUA (LAGEVRIO) 200 mg CAPS capsule Take 4 capsules (800 mg total) by mouth 2 (two) times daily for 5 days. 04/01/23 04/06/23 Yes Sherian Maroon A, PA  oxyCODONE (ROXICODONE) 15 MG immediate release tablet Take 1 tablet (15 mg total) by mouth every 8 (eight) hours as needed for pain. 04/01/23  Yes Sherian Maroon A, PA  albuterol (VENTOLIN HFA) 108 (90 Base) MCG/ACT inhaler TAKE 2 PUFFS BY MOUTH EVERY 6 HOURS AS NEEDED FOR WHEEZE OR  SHORTNESS OF BREATH 09/05/22   Ivonne Andrew, NP  amLODipine (NORVASC) 10 MG tablet Take 1 tablet (10 mg total) by mouth daily. 04/17/22   Ivonne Andrew, NP  cetirizine (ZYRTEC) 10 MG tablet TAKE 1 TABLET BY MOUTH EVERY DAY AS NEEDED FOR ALLERGY Patient taking differently: Take 10 mg by mouth daily as needed for allergies. 04/17/22   Ivonne Andrew, NP  cloNIDine (CATAPRES) 0.1 MG tablet TAKE 1 TABLET BY MOUTH 2 TIMES DAILY. 07/17/22   Ivonne Andrew, NP  DULoxetine (CYMBALTA) 30 MG capsule Take 1 capsule (30 mg total) by mouth daily. 08/25/22   Pokhrel, Rebekah Chesterfield, MD  gabapentin (NEURONTIN) 300 MG capsule Take 1 capsule (300 mg total) by mouth 2 (two) times daily. 07/17/22   Ivonne Andrew, NP  ibuprofen (ADVIL) 600 MG tablet Take 1 tablet (600 mg total) by mouth every 8 (eight) hours as needed. 01/01/23   Ivonne Andrew, NP  losartan-hydrochlorothiazide (HYZAAR) 100-25 MG tablet Take 1 tablet by mouth daily. 08/27/22   Pokhrel, Rebekah Chesterfield, MD  metoprolol succinate (TOPROL-XL) 50 MG 24 hr tablet Take 1 tablet (50 mg total) by mouth daily. Take with or immediately following a meal. 04/17/22   Ivonne Andrew, NP  montelukast (SINGULAIR) 10 MG tablet TAKE 1 TABLET BY MOUTH EVERYDAY AT BEDTIME Patient taking differently: Take 10 mg by mouth at bedtime. 04/17/22   Ivonne Andrew, NP  nicotine (NICODERM CQ - DOSED IN MG/24 HOURS) 21 mg/24hr  patch Place 1 patch (21 mg total) onto the skin daily. 11/22/22   Ivonne Andrew, NP  pantoprazole (PROTONIX) 40 MG tablet Take 1 tablet (40 mg total) by mouth daily. Patient taking differently: Take 40 mg by mouth See admin instructions. Take 40 mg by mouth at least 30 minutes before a meal- up to once a day 04/17/22   Ivonne Andrew, NP  tiZANidine (ZANAFLEX) 4 MG tablet Take 1 tablet (4 mg total) by mouth every 6 (six) hours as needed for muscle spasms. 10/13/22   Ivonne Andrew, NP      Allergies    Patient has no known allergies.    Review of Systems   Review  of Systems  Respiratory:  Positive for cough.   All other systems reviewed and are negative.   Physical Exam Updated Vital Signs BP (!) 158/110   Pulse 95   Temp 98.5 F (36.9 C) (Oral)   Resp 16   SpO2 98%  Physical Exam Vitals and nursing note reviewed.  Constitutional:      General: Dean Webb is not in acute distress.    Appearance: Dean Webb is well-developed.  HENT:     Head: Normocephalic and atraumatic.     Nose: Congestion present.     Mouth/Throat:     Comments: Mild posterior pharyngeal erythema.  Uvula midline rise metrical phonation.  No sublingual or symptoms or swelling.  No trismus.  Tonsils 0+ bilaterally without exudate. Eyes:     Conjunctiva/sclera: Conjunctivae normal.  Cardiovascular:     Rate and Rhythm: Normal rate and regular rhythm.  Pulmonary:     Effort: Pulmonary effort is normal. No respiratory distress.     Breath sounds: Wheezing present.     Comments: Faint expiratory wheeze. Abdominal:     Palpations: Abdomen is soft.     Tenderness: There is no abdominal tenderness. There is no guarding.  Musculoskeletal:        General: No swelling.     Cervical back: Neck supple.     Right lower leg: No edema.     Left lower leg: No edema.  Skin:    General: Skin is warm and dry.     Capillary Refill: Capillary refill takes less than 2 seconds.  Neurological:     Mental Status: Dean Webb is alert.  Psychiatric:        Mood and Affect: Mood normal.     ED Results / Procedures / Treatments   Labs (all labs ordered are listed, but only abnormal results are displayed) Labs Reviewed  RESP PANEL BY RT-PCR (RSV, FLU A&B, COVID)  [id number] - Abnormal; Notable for the following components:      Result Value   SARS Coronavirus 2 by RT PCR POSITIVE (*)    All other components within normal limits  COMPREHENSIVE METABOLIC PANEL - Abnormal; Notable for the following components:   Sodium 134 (*)    CO2 19 (*)    Glucose, Bld 158 (*)    Creatinine, Ser 1.26 (*)    Calcium  10.9 (*)    AST 71 (*)    ALT 47 (*)    Total Bilirubin 1.8 (*)    All other components within normal limits  CBC WITH DIFFERENTIAL/PLATELET - Abnormal; Notable for the following components:   MCHC 36.4 (*)    nRBC 1.9 (*)    Monocytes Absolute 1.8 (*)    All other components within normal limits  RETICULOCYTES - Abnormal; Notable for the following  components:   Retic Ct Pct 3.4 (*)    All other components within normal limits  TROPONIN I (HIGH SENSITIVITY)    EKG None  Radiology DG Chest 2 View Result Date: 04/01/2023 CLINICAL DATA:  Chest pain. EXAM: CHEST - 2 VIEW COMPARISON:  08/23/2022. FINDINGS: Bilateral lung fields are clear. Bilateral costophrenic angles are clear. Normal cardio-mediastinal silhouette. No acute osseous abnormalities. Old healed right middle third clavicular fracture noted. The soft tissues are within normal limits. IMPRESSION: No active cardiopulmonary disease. Electronically Signed   By: Jules Schick M.D.   On: 04/01/2023 16:58    Procedures Procedures    Medications Ordered in ED Medications  oxyCODONE (Oxy IR/ROXICODONE) immediate release tablet 15 mg (15 mg Oral Given 04/01/23 1652)  acetaminophen (TYLENOL) tablet 1,000 mg (1,000 mg Oral Given 04/01/23 1742)  morphine (PF) 4 MG/ML injection 4 mg (4 mg Intramuscular Given 04/01/23 1745)  HYDROmorphone (DILAUDID) injection 1 mg (1 mg Intramuscular Given 04/01/23 1849)    ED Course/ Medical Decision Making/ A&P Clinical Course as of 04/01/23 1933  Sun Apr 01, 2023  1819 Reevaluation of the patient showed continued pain.  Patient does not think that Dean Webb will be able to go home due to amount of pain that Dean Webb is in.  Will try third round of pain medication and reassess thereafter. [CR]  1925 Consulted hospitalist Dr. Lazarus Salines who agreed with admission and assume further treatment/care. [CR]    Clinical Course User Index [CR] Peter Garter, PA                                 Medical Decision  Making Amount and/or Complexity of Data Reviewed Labs: ordered. Radiology: ordered.  Risk OTC drugs. Prescription drug management. Decision regarding hospitalization.   This patient presents to the ED for concern of cough, congestion, body aches, this involves an extensive number of treatment options, and is a complaint that carries with it a high risk of complications and morbidity.  The differential diagnosis includes,, flu, RSV, pneumonia, aplastic crisis, acute chest syndrome, COVID, sepsis, other   Co morbidities that complicate the patient evaluation  See HPI   Additional history obtained:  Additional history obtained from EMR External records from outside source obtained and reviewed including hospital records   Lab Tests:  I Ordered, and personally interpreted labs.  The pertinent results include: No leukocytosis.  No evidence of anemia.  Platelets within range.  COVID-positive.  Hyponatremia 134, decreased bicarb of 19.  Slight transaminitis AST of 71, ALT of 47.  No renal dysfunction.  12   Imaging Studies ordered:  I ordered imaging studies including chest x-ray I independently visualized and interpreted imaging which showed no acute cardiopulmonary abnormality. I agree with the radiologist interpretation   Cardiac Monitoring: / EKG:  The patient was maintained on a cardiac monitor.  I personally viewed and interpreted the cardiac monitored which showed an underlying rhythm of: Sinus tachycardia.  Left atrial enlargement.  Borderline T wave/ST changes.  Consultations Obtained:  N/a   Problem List / ED Course / Critical interventions / Medication management  COVID, myalgia, nasal congestion, cough I ordered medication including oxycodone f  Reevaluation of the patient after these medicines showed that the patient improved I have reviewed the patients home medicines and have made adjustments as needed   Social Determinants of Health:  Cocaine use.   Tobacco dependence.   Test / Admission - Considered:  COVID, myalgia, nasal congestion, cough Vitals signs significant for hypertension blood pressure 150/110. Otherwise within normal range and stable throughout visit. Laboratory/imaging studies significant for: See above 48 year old male presents emergency department with bodyaches, cough, congestion, chest pain that began yesterday.  Patient with history of sickle cell anemia and has been without his at home pain medication for the past couple of months.  On exam, lungs clear to auscultation bilaterally.  Myalgias somewhat diffuse in nature without obvious focality.  Laboratory studies reassuring without evidence of aplastic crisis.  COVID positive.  Troponin negative; low suspicion for ACS.  Chest x-ray without evidence of pneumonia or other acute cardiopulmonary abnormalities.  Suspect the patient symptoms likely secondary to COVID.  Attempted to manage patient's pain while in the emergency department without improvement after multiple rounds of opiate medication.  Patient requesting admission without attempt to manage symptoms at home.  Consulted hospitalist who agreed with admission.  Patient stable on admission.        Final Clinical Impression(s) / ED Diagnoses Final diagnoses:  COVID  Sickle cell anemia with pain [facility])    Rx / DC Orders      Peter Garter, [place] 04/01/23 1933    Vanetta Mulders, MD 04/04/23 1103

## 2023-04-01 NOTE — Discharge Instructions (Addendum)
As discussed, you did test positive for COVID which is most likely causing symptoms.  Will give antiviral in the form of molnupiravir.  Recommend cough suppressant to use as needed.  Continue take Tylenol for any pain/fevers.  Will send home with nausea medicine to use as needed.  Continue oral hydration via electrolyte rich fluids such as Pedialyte, liquid IV, Body Armor, etc.  Recommend follow-up with your primary care for reassessment.  Please not hesitate to return if the worrisome signs and symptoms we discussed become apparent.

## 2023-04-01 NOTE — H&P (Signed)
History and Physical    Harland Aguiniga Passey ZOX:[id number] DOB: [birth year] DOA: 04/01/2023  PCP: Ivonne Andrew, NP   Patient coming from: Home   Chief Complaint:  Chief Complaint  Patient presents with   Sickle Cell Pain Crisis   Chills   Cough    HPI:  Dean Webb is a 48 y.o. 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  Reports 1 day of cough along with sore throat, rhinorrhea, mild dyspnea. Reports mild chest tightness. Has had abd burning and diarrhea without blood. Associated has had worsening pain in his distal arms and legs consistent with his sickle cell pain in the past.  He has been out of his home oxycodone (last filled in 11/' 24) and is planning to see primary care for his chronic sickle cell related pain.  Review of medication fill history also shows no recent fill of his other chronic home medications including antihypertensives, he reports that he is still taking clonidine.    Review of Systems:  ROS complete and negative except as marked above   No Known Allergies  Prior to Admission medications   Medication Sig Start Date End Date Taking? Authorizing Provider  benzonatate (TESSALON) 100 MG capsule Take 1 capsule (100 mg total) by mouth 3 (three) times daily as needed for cough. 04/01/23  Yes Sherian Maroon A, PA  molnupiravir EUA (LAGEVRIO) 200 mg CAPS capsule Take 4 capsules (800 mg total) by mouth 2 (two) times daily for 5 days. 04/01/23 04/06/23 Yes Sherian Maroon A, PA  oxyCODONE (ROXICODONE) 15 MG immediate release tablet Take 1 tablet (15 mg total) by mouth every 8 (eight) hours as needed for pain. 04/01/23  Yes Sherian Maroon A, PA  albuterol (VENTOLIN HFA) 108 (90 Base) MCG/ACT inhaler TAKE 2 PUFFS BY MOUTH EVERY 6 HOURS AS NEEDED FOR WHEEZE OR SHORTNESS OF BREATH 09/05/22   Ivonne Andrew, NP  amLODipine (NORVASC) 10 MG tablet  Take 1 tablet (10 mg total) by mouth daily. 04/17/22   Ivonne Andrew, NP  cetirizine (ZYRTEC) 10 MG tablet TAKE 1 TABLET BY MOUTH EVERY DAY AS NEEDED FOR ALLERGY Patient taking differently: Take 10 mg by mouth daily as needed for allergies. 04/17/22   Ivonne Andrew, NP  cloNIDine (CATAPRES) 0.1 MG tablet TAKE 1 TABLET BY MOUTH 2 TIMES DAILY. 07/17/22   Ivonne Andrew, NP  DULoxetine (CYMBALTA) 30 MG capsule Take 1 capsule (30 mg total) by mouth daily. 08/25/22   Pokhrel, Rebekah Chesterfield, MD  gabapentin (NEURONTIN) 300 MG capsule Take 1 capsule (300 mg total) by mouth 2 (two) times daily. 07/17/22   Ivonne Andrew, NP  ibuprofen (ADVIL) 600 MG tablet Take 1 tablet (600 mg total) by mouth every 8 (eight) hours as needed. 01/01/23   Ivonne Andrew, NP  losartan-hydrochlorothiazide (HYZAAR) 100-25 MG tablet Take 1 tablet by mouth daily. 08/27/22   Pokhrel, Rebekah Chesterfield, MD  metoprolol succinate (TOPROL-XL) 50 MG 24 hr tablet Take 1 tablet (50 mg total) by mouth daily. Take with or immediately following a meal. 04/17/22   Ivonne Andrew, NP  montelukast (SINGULAIR) 10 MG tablet TAKE 1 TABLET BY MOUTH EVERYDAY AT BEDTIME Patient taking differently: Take 10 mg by mouth at bedtime. 04/17/22   Ivonne Andrew, NP  nicotine (NICODERM CQ - DOSED IN MG/24 HOURS) 21 mg/24hr patch Place 1 patch (21 mg total) onto  the skin daily. 11/22/22   Ivonne Andrew, NP  pantoprazole (PROTONIX) 40 MG tablet Take 1 tablet (40 mg total) by mouth daily. Patient taking differently: Take 40 mg by mouth See admin instructions. Take 40 mg by mouth at least 30 minutes before a meal- up to once a day 04/17/22   Ivonne Andrew, NP  tiZANidine (ZANAFLEX) 4 MG tablet Take 1 tablet (4 mg total) by mouth every 6 (six) hours as needed for muscle spasms. 10/13/22   Ivonne Andrew, NP    Past Medical History:  Diagnosis Date   Asthma    GERD (gastroesophageal reflux disease)    Hb-S/Hb-C disease (HCC) 1979   HTN (hypertension) 2008   Hypertension     Sickle cell anemia (HCC)    Sickle cell disease (HCC)     Past Surgical History:  Procedure Laterality Date   CHOLECYSTECTOMY       reports that he has been smoking cigarettes. He has a 8.5 pack-year smoking history. He has never used smokeless tobacco. He reports current alcohol use. He reports that he does not currently use drugs after having used the following drugs: Marijuana.  Family History  Problem Relation Age of Onset   Sickle cell trait Mother    Hypertension Mother    Asthma Mother    Sickle cell trait Father    Congestive Heart Failure Father    Hypertension Sister    Sickle cell anemia Daughter    Hypertension Daughter    Diabetes Daughter      Physical Exam: Vitals:   04/01/23 1526 04/01/23 1530 04/01/23 1807 04/01/23 1913  BP:  (!) 169/123 (!) 158/110   Pulse: 83 (!) 116 95   Resp: 16 18 16    Temp: 98.4 F (36.9 C) 98.7 F (37.1 C)  98.5 F (36.9 C)  TempSrc: Oral   Oral  SpO2: 97% 98% 98%     Gen: Awake, alert, NAD   CV: Regular, normal S1, S2, no murmurs  Resp: Normal WOB, CTAB  Abd: Flat, normoactive, mild diffuse tenderness  MSK: Symmetric, mild tenderness of extremities  Skin: No rashes or lesions to exposed skin  Neuro: Alert and interactive  Psych: euthymic, appropriate    Data review:   Labs reviewed, notable for:   Bicarb 19, anion gap 13,  creatinine 1.2 near baseline Calcium 10.9 T. bili 1.8, AST 71, ALT 47 Hemoglobin 14, and NRBC 1.9, reticulocyte percent 3.4  Micro:  Results for orders placed or performed during the hospital encounter of 04/01/23  Resp panel by RT-PCR (RSV, Flu A&B, Covid) Anterior Nasal Swab     Status: Abnormal   Collection Time: 04/01/23  3:41 PM   Specimen: Anterior Nasal Swab  Result Value Ref Range Status   SARS Coronavirus 2 by RT PCR POSITIVE (A) NEGATIVE Final    Comment: (NOTE) SARS-CoV-2 target nucleic acids are DETECTED.  The SARS-CoV-2 RNA is generally detectable in upper  respiratory specimens during the acute phase of infection. Positive results are indicative of the presence of the identified virus, but do not rule out bacterial infection or co-infection with other pathogens not detected by the test. Clinical correlation with patient history and other diagnostic information is necessary to determine patient infection status. The expected result is Negative.  Fact Sheet for Patients: BloggerCourse.com  Fact Sheet for Healthcare Providers: SeriousBroker.it  This test is not yet approved or cleared by the [place] FDA and  has been authorized for detection and/or diagnosis of  SARS-CoV-2 by FDA under an Emergency Use Authorization (EUA).  This EUA will remain in effect (meaning this test can be used) for the duration of  the COVID-19 declaration under Section 564(b)(1) of the A ct, 21 U.S.C. section 360bbb-3(b)(1), unless the authorization is terminated or revoked sooner.     Influenza A by PCR NEGATIVE NEGATIVE Final   Influenza B by PCR NEGATIVE NEGATIVE Final    Comment: (NOTE) The Xpert Xpress SARS-CoV-2/FLU/RSV plus assay is intended as an aid in the diagnosis of influenza from Nasopharyngeal swab specimens and should not be used as a sole basis for treatment. Nasal washings and aspirates are unacceptable for Xpert Xpress SARS-CoV-2/FLU/RSV testing.  Fact Sheet for Patients: BloggerCourse.com  Fact Sheet for Healthcare Providers: SeriousBroker.it  This test is not yet approved or cleared by the [place] FDA and has been authorized for detection and/or diagnosis of SARS-CoV-2 by FDA under an Emergency Use Authorization (EUA). This EUA will remain in effect (meaning this test can be used) for the duration of the COVID-19 declaration under Section 564(b)(1) of the Act, 21 U.S.C. section 360bbb-3(b)(1), unless the authorization is  terminated or revoked.     Resp Syncytial Virus by PCR NEGATIVE NEGATIVE Final    Comment: (NOTE) Fact Sheet for Patients: BloggerCourse.com  Fact Sheet for Healthcare Providers: SeriousBroker.it  This test is not yet approved or cleared by the [place] FDA and has been authorized for detection and/or diagnosis of SARS-CoV-2 by FDA under an Emergency Use Authorization (EUA). This EUA will remain in effect (meaning this test can be used) for the duration of the COVID-19 declaration under Section 564(b)(1) of the Act, 21 U.S.C. section 360bbb-3(b)(1), unless the authorization is terminated or revoked.  Performed at [facility], [address] [place], [place] [address]     Imaging reviewed:  DG Chest 2 View Result Date: 04/01/2023 CLINICAL DATA:  Chest pain. EXAM: CHEST - 2 VIEW COMPARISON:  08/23/2022. FINDINGS: Bilateral lung fields are clear. Bilateral costophrenic angles are clear. Normal cardio-mediastinal silhouette. No acute osseous abnormalities. Old healed right middle third clavicular fracture noted. The soft tissues are within normal limits. IMPRESSION: No active cardiopulmonary disease. Electronically Signed   By: Jules Schick M.D.   On: 04/01/2023 16:58    ED Course:  Treated with oxycodone, morphine, dilaudid, tylenol. Remains with severe pain and referred for admission.     Assessment/Plan:  48 y.o. male with hx sickle [place] disease, asthma, hypertension, CKD stage II, OSA, substance use (cocaine), who presented due to acute onset of respiratory symptoms and sickle cell type pain. Admitted for sickle cell [place] [facility] in setting of COVID 19 illness.   Vasoocclusive crisis  Acute onset extremity pain consistent with sickle cell type pain in the past. Had been out of home oxycodone since 11/'24. Hemoglobin 14, and NRBC 1.9, reticulocyte percent 3.4. T Bili 1.8, not fractionated.  -Appears  hypovolemic, 1 L NS then continue oral hydration  -For pain control schedule oxycodone 15 mg every 6 hour, add additional 7.5/15 mg for moderate/severe pain.  For breakthrough Dilaudid 0.5 mg IV every 4 hours as needed -As adjuncts scheduled Tylenol 500 mg every 6 hours.  Avoid NSAIDs with his CKD.  Add lidocaine, heat. -Restart on folate daily -I-S -Sickle cell team to assume care in the morning  COVID 19 illness, without hypoxia  1 day of URI/LRI symptoms.  No hypoxia.  Chest x-ray without infiltrate.  High risk for progression to severe disease due to  his relative immunosuppression from sickle cell disease.  Discussed risk benefits with patient of Paxlovid and he would like to start this medication. - Paxlovid for 5 days, pharmacy assistance for medication review and dosing. - Supportive care albuterol as needed, incentive stronger, flutter valve  Acute liver injury, hepatocellular pattern AST 71, ALT 47. T. bili 1.8. Bili / AST may be related to ongoing hemolysis from sickle cell. Vs. Mild liver injury from viral illness.  -Trend LFT.  If he has persistent LFT abnormality recommend liver ultrasound outpatient. -With his history of substance use check hep B/C serologies  Mild hypercalcemia  Suspect related to volume depletion.  -IV fluid per above then continue oral hydration.  If calcium remains elevated check PTH for initial evaluation.  Smoking cessation  Smoking 1/2 pack/day.  Counseled on importance of smoking cessation especially with his history of sickle cell disease and vascular risk over time.  He is interested in cutting down and quitting. - Nicotine 7 mg patch, gum prn in addition., Rx at discharge   Chronic medical problems: Asthma: None prn Hypertension: Appears has been out of antihypertensives since 7/' 24.  Reports still taking clonidine, if he has intermittent nonadherence with medications clonidine may not be the best option.  Discussed amlodipine he is okay with  starting, start amlodipine 5 mg tomorrow. CKD stage II: Cr 1.2 near baseline. Discussed early kidney disease and importance of HTN control to prevent progression.  OSA: Not on CPAP  Hx substance use disorder (cocaine): Check UDS   There is no height or weight on file to calculate BMI.    DVT prophylaxis:  Lovenox Code Status:  Full Code Diet:  Diet Orders (From admission, onward)     Start     Ordered   04/01/23 1937  Diet regular Room service appropriate? Yes; Fluid consistency: Thin  Diet effective now       Question Answer Comment  Room service appropriate? Yes   Fluid consistency: Thin      04/01/23 1943           Family Communication:  No   Consults:  None   Admission status:   Observation, Med-Surg  Severity of Illness:  The appropriate patient status for this patient is OBSERVATION. Observation status is judged to be reasonable and necessary in order to provide the required intensity of service to ensure the patient's safety. The patient's presenting symptoms, physical exam findings, and initial radiographic and laboratory data in the context of their medical condition is felt to place them at decreased risk for further clinical deterioration. Furthermore, it is anticipated that the patient will be medically stable for discharge from the hospital within 2 midnights of admission.    Dolly Rias, MD [facility]  How to contact the [facility] Attending or Consulting provider 7A - 7P or covering provider during after hours 7P -7A, for this patient.  Check the care team in [facility] and look for a) attending/consulting [facility] provider listed and b) the [facility] team listed Log into [email or web address] and use [place]'s universal password to access. If you do not have the password, please contact the hospital operator. Locate the [facility] provider you are looking for under [facility] and page to a number that you can be directly reached. If you still have difficulty reaching the  provider, please page the [facility] (Director on Call) for the Hospitalists listed on amion for assistance.  04/01/2023, 8:07 PM

## 2023-04-01 NOTE — ED Notes (Signed)
 Called lab to add on troponin

## 2023-04-02 ENCOUNTER — Encounter (HOSPITAL_COMMUNITY): Payer: Self-pay | Admitting: Internal Medicine

## 2023-04-02 DIAGNOSIS — D5700 Hb-SS disease with crisis, unspecified: Secondary | ICD-10-CM | POA: Diagnosis not present

## 2023-04-02 LAB — BASIC METABOLIC PANEL
Anion gap: 9 (ref 5–15)
BUN: 7 mg/dL (ref 6–20)
CO2: 21 mmol/L — ABNORMAL LOW (ref 22–32)
Calcium: 10 mg/dL (ref 8.9–10.3)
Chloride: 103 mmol/L (ref 98–111)
Creatinine, Ser: 0.91 mg/dL (ref 0.61–1.24)
GFR, Estimated: >60 mL/min (ref >60)
Glucose, Bld: 132 mg/dL — ABNORMAL HIGH (ref 70–99)
Potassium: 3.6 mmol/L (ref 3.5–5.1)
Sodium: 133 mmol/L — ABNORMAL LOW (ref 135–145)

## 2023-04-02 LAB — HEPATIC FUNCTION PANEL
ALT: 104 U/L — ABNORMAL HIGH (ref 0–44)
AST: 213 U/L — ABNORMAL HIGH (ref 15–41)
Albumin: 3.9 g/dL (ref 3.5–5.0)
Alkaline Phosphatase: 86 U/L (ref 38–126)
Bilirubin, Direct: 0.4 mg/dL — ABNORMAL HIGH (ref 0.0–0.2)
Indirect Bilirubin: 1.6 mg/dL — ABNORMAL HIGH (ref 0.3–0.9)
Total Bilirubin: 2 mg/dL — ABNORMAL HIGH (ref 0.0–1.2)
Total Protein: 6.7 g/dL (ref 6.5–8.1)

## 2023-04-02 LAB — CBC
HCT: 38 % — ABNORMAL LOW (ref 39.0–52.0)
Hemoglobin: 14 g/dL (ref 13.0–17.0)
MCH: 29.7 pg (ref 26.0–34.0)
MCHC: 36.8 g/dL — ABNORMAL HIGH (ref 30.0–36.0)
MCV: 80.7 fL (ref 80.0–100.0)
Platelets: 247 10*3/uL (ref 150–400)
RBC: 4.71 MIL/uL (ref 4.22–5.81)
RDW: 14.6 % (ref 11.5–15.5)
WBC: 9.2 10*3/uL (ref 4.0–10.5)
nRBC: 4.4 % — ABNORMAL HIGH (ref 0.0–0.2)

## 2023-04-02 LAB — HEPATITIS B SURFACE ANTIGEN: Hepatitis B Surface Ag: NONREACTIVE

## 2023-04-02 LAB — PHOSPHORUS: Phosphorus: 2.4 mg/dL — ABNORMAL LOW (ref 2.5–4.6)

## 2023-04-02 LAB — MAGNESIUM: Magnesium: 2 mg/dL (ref 1.7–2.4)

## 2023-04-02 LAB — HEPATITIS B SURFACE ANTIBODY,QUALITATIVE: Hep B S Ab: NONREACTIVE

## 2023-04-02 LAB — ETHANOL: Alcohol, Ethyl (B): <10 mg/dL (ref <10)

## 2023-04-02 LAB — HEPATITIS C ANTIBODY: HCV Ab: NONREACTIVE

## 2023-04-02 MED ORDER — ORAL CARE MOUTH RINSE: 15.0000 mL | OROMUCOSAL | Status: DC | PRN

## 2023-04-02 MED ORDER — HYDROMORPHONE HCL 1 MG/ML IJ SOLN
1.0000 mg | INTRAMUSCULAR | Status: DC | PRN
  Administered 2023-04-02 (×2): 1 mg via INTRAVENOUS
  Filled 2023-04-02 (×2): qty 1

## 2023-04-02 NOTE — Plan of Care (Signed)
  Problem: Education: Goal: Knowledge of risk factors and measures for prevention of condition will improve Outcome: Progressing   Problem: Coping: Goal: Psychosocial and spiritual needs will be supported Outcome: Progressing   Problem: Respiratory: Goal: Will maintain a patent airway Outcome: Progressing Goal: Complications related to the disease process, condition or treatment will be avoided or minimized Outcome: Progressing   Problem: Education: Goal: Knowledge of General Education information will improve Description: Including pain rating scale, medication(s)/side effects and non-pharmacologic comfort measures Outcome: Progressing   Problem: Health Behavior/Discharge Planning: Goal: Ability to manage health-related needs will improve Outcome: Progressing   Problem: Clinical Measurements: Goal: Ability to maintain clinical measurements within normal limits will improve Outcome: Progressing Goal: Will remain free from infection Outcome: Progressing Goal: Diagnostic test results will improve Outcome: Progressing Goal: Respiratory complications will improve Outcome: Progressing Goal: Cardiovascular complication will be avoided Outcome: Progressing   Problem: Activity: Goal: Risk for activity intolerance will decrease Outcome: Progressing   Problem: Nutrition: Goal: Adequate nutrition will be maintained Outcome: Progressing   Problem: Coping: Goal: Level of anxiety will decrease Outcome: Progressing   Problem: Elimination: Goal: Will not experience complications related to bowel motility Outcome: Progressing Goal: Will not experience complications related to urinary retention Outcome: Progressing   Problem: Pain Managment: Goal: General experience of comfort will improve and/or be controlled Outcome: Progressing   Problem: Safety: Goal: Ability to remain free from injury will improve Outcome: Progressing   Problem: Skin Integrity: Goal: Risk for impaired  skin integrity will decrease Outcome: Progressing   Problem: Education: Goal: Knowledge of vaso-occlusive preventative measures will improve Outcome: Progressing Goal: Awareness of infection prevention will improve Outcome: Progressing Goal: Awareness of signs and symptoms of anemia will improve Outcome: Progressing Goal: Long-term complications will improve Outcome: Progressing   Problem: Self-Care: Goal: Ability to incorporate actions that prevent/reduce pain crisis will improve Outcome: Progressing   Problem: Bowel/Gastric: Goal: Gut motility will be maintained Outcome: Progressing   Problem: Tissue Perfusion: Goal: Complications related to inadequate tissue perfusion will be avoided or minimized Outcome: Progressing   Problem: Respiratory: Goal: Pulmonary complications will be avoided or minimized Outcome: Progressing Goal: Acute Chest Syndrome will be identified early to prevent complications Outcome: Progressing   Problem: Fluid Volume: Goal: Ability to maintain a balanced intake and output will improve Outcome: Progressing   Problem: Sensory: Goal: Pain level will decrease with appropriate interventions Outcome: Progressing   Problem: Health Behavior: Goal: Postive changes in compliance with treatment and prescription regimens will improve Outcome: Progressing

## 2023-04-02 NOTE — Progress Notes (Signed)
   04/02/23 1125  TOC Brief Assessment  Insurance and Status Reviewed  Patient has primary care physician Yes  Home environment has been reviewed resides in a private residence  Prior level of function: Independent  Prior/Current Home Services No current home services  Social Drivers of Health Review SDOH reviewed no interventions necessary  Readmission risk has been reviewed Yes  Transition of care needs no transition of care needs at this time

## 2023-04-02 NOTE — Progress Notes (Signed)
SICKLE CELL SERVICE PROGRESS NOTE  Dean Webb ZOX:[id number] DOB: [birth year] DOA: 04/01/2023 PCP: Ivonne Andrew, NP  Assessment/Plan: Principal Problem:   Vasoocclusive sickle cell crisis (HCC)  Sickle cell pain crisis: Patient is not on PCA.  He is getting IV Dilaudid with oral oxycodone.  Continue to assess pain.  Ultimately patient will be discharged home on his oral home regimen. Anemia of chronic disease: Continue H&H. Chronic pain syndrome: Continue chronic home regimen Uncontrolled hypertension: Continue home regimen.  Adjust medication as needed  Code Status: Full code Family Communication: No family at bedside Disposition Plan: Home  [facility]  Pager [phone]. If 7PM-7AM, please contact night-coverage.  04/02/2023, 10:48 AM  LOS: 0 days   Brief narrative: Dean Webb is a 48 y.o. male with hx of sickle  disease, asthma, hypertension, OSA, substance use (cocaine), who presented due to acute onset of respiratory symptoms and sickle cell type pain.  Reports 1 day of cough along with sore throat, rhinorrhea, mild dyspnea. Reports mild chest tightness. Has had abd burning and diarrhea without blood. Associated has had worsening pain in his distal arms and legs consistent with his sickle cell pain in the past.  He has been out of his home oxycodone (last filled in 11/' 24) and is planning to see primary care for his chronic sickle cell related pain.  Review of medication fill history also shows no recent fill of his other chronic home medications including antihypertensives, he reports that he is still taking clonidine   Consultants: None  Procedures: Chest x-ray  Antibiotics: None  HPI/Subjective: Patient is still having pain rated as 7 out of 10.  No nausea vomiting diarrhea  Objective: Vitals:   04/01/23 2359 04/02/23 0000 04/02/23 0454 04/02/23 0856  BP: (!) 186/110  (!) 144/90 (!) 154/122  Pulse: 68  68 87  Resp: 20  18 18   Temp: 98.9 F (37.2  C)  98.2 F (36.8 C) 98.6 F (37 C)  TempSrc: Oral  Oral Oral  SpO2: 97%  97% 98%  Weight:  88.6 kg    Height:  5\' 7"  (1.702 m)     Weight change:   Intake/Output Summary (Last 24 hours) at 04/02/2023 1048 Last data filed at 04/02/2023 5621 Gross per 24 hour  Intake 1000 ml  Output 350 ml  Net 650 ml    General: Alert, awake, oriented x3, in no acute distress.  HEENT: [place]/AT PEERL, EOMI Neck: Trachea midline,  no masses, no thyromegal,y no JVD, no carotid bruit OROPHARYNX:  Moist, No exudate/ erythema/lesions.  Heart: Regular rate and rhythm, without murmurs, rubs, gallops, PMI non-displaced, no heaves or thrills on palpation.  Lungs: Clear to auscultation, no wheezing or rhonchi noted. No increased vocal fremitus resonant to percussion  Abdomen: Soft, nontender, nondistended, positive bowel sounds, no masses no hepatosplenomegaly noted..  Neuro: No focal neurological deficits noted cranial nerves II through XII grossly intact. DTRs 2+ bilaterally upper and lower extremities. Strength 5 out of 5 in bilateral upper and lower extremities. Musculoskeletal: No warm swelling or erythema around joints, no spinal tenderness noted. Psychiatric: Patient alert and oriented x3, good insight and cognition, good recent to remote recall. Lymph node survey: No cervical axillary or inguinal lymphadenopathy noted.   Data Reviewed: Basic Metabolic Panel: Recent Labs  Lab 04/01/23 1541 04/02/23 0023  NA 134* 133*  K 3.5 3.6  CL 102 103  CO2 19* 21*  GLUCOSE 158* 132*  BUN 9 7  CREATININE 1.26* 0.91  CALCIUM 10.9* 10.0  MG  --  2.0  PHOS  --  2.4*   Liver Function Tests: Recent Labs  Lab 04/01/23 1541 04/02/23 0023  AST 71* 213*  ALT 47* 104*  ALKPHOS 83 86  BILITOT 1.8* 2.0*  PROT 7.5 6.7  ALBUMIN 4.1 3.9   No results for input(s): "LIPASE", "AMYLASE" in the last 168 hours. No results for input(s): "AMMONIA" in the last 168 hours. CBC: Recent Labs  Lab 04/01/23 1541  04/02/23 0023  WBC 8.8 9.2  NEUTROABS 5.9  --   HGB 14.8 14.0  HCT 40.7 38.0*  MCV 81.9 80.7  PLT 301 247   Cardiac Enzymes: No results for input(s): "CKTOTAL", "CKMB", "CKMBINDEX", "TROPONINI" in the last 168 hours. BNP (last 3 results) No results for input(s): "BNP" in the last 8760 hours.  ProBNP (last 3 results) No results for input(s): "PROBNP" in the last 8760 hours.  CBG: No results for input(s): "GLUCAP" in the last 168 hours.  Recent Results (from the past 240 hours)  Resp panel by RT-PCR (RSV, Flu A&B, Covid) Anterior Nasal Swab     Status: Abnormal   Collection Time: 04/01/23  3:41 PM   Specimen: Anterior Nasal Swab  Result Value Ref Range Status   SARS Coronavirus 2 by RT PCR POSITIVE (A) NEGATIVE Final    Comment: (NOTE) SARS-CoV-2 target nucleic acids are DETECTED.  The SARS-CoV-2 RNA is generally detectable in upper respiratory specimens during the acute phase of infection. Positive results are indicative of the presence of the identified virus, but do not rule out bacterial infection or co-infection with other pathogens not detected by the test. Clinical correlation with patient history and other diagnostic information is necessary to determine patient infection status. The expected result is Negative.  Fact Sheet for Patients: BloggerCourse.com  Fact Sheet for Healthcare Providers: SeriousBroker.it  This test is not yet approved or cleared by the [place] FDA and  has been authorized for detection and/or diagnosis of SARS-CoV-2 by FDA under an Emergency Use Authorization (EUA).  This EUA will remain in effect (meaning this test can be used) for the duration of  the COVID-19 declaration under Section 564(b)(1) of the A ct, 21 U.S.C. section 360bbb-3(b)(1), unless the authorization is terminated or revoked sooner.     Influenza A by PCR NEGATIVE NEGATIVE Final   Influenza B by PCR NEGATIVE  NEGATIVE Final    Comment: (NOTE) The Xpert Xpress SARS-CoV-2/FLU/RSV plus assay is intended as an aid in the diagnosis of influenza from Nasopharyngeal swab specimens and should not be used as a sole basis for treatment. Nasal washings and aspirates are unacceptable for Xpert Xpress SARS-CoV-2/FLU/RSV testing.  Fact Sheet for Patients: BloggerCourse.com  Fact Sheet for Healthcare Providers: SeriousBroker.it  This test is not yet approved or cleared by the [place] FDA and has been authorized for detection and/or diagnosis of SARS-CoV-2 by FDA under an Emergency Use Authorization (EUA). This EUA will remain in effect (meaning this test can be used) for the duration of the COVID-19 declaration under Section 564(b)(1) of the Act, 21 U.S.C. section 360bbb-3(b)(1), unless the authorization is terminated or revoked.     Resp Syncytial Virus by PCR NEGATIVE NEGATIVE Final    Comment: (NOTE) Fact Sheet for Patients: BloggerCourse.com  Fact Sheet for Healthcare Providers: SeriousBroker.it  This test is not yet approved or cleared by the [place] FDA and has been authorized for detection and/or diagnosis of SARS-CoV-2 by FDA under an Emergency Use Authorization (EUA).  This EUA will remain in effect (meaning this test can be used) for the duration of the COVID-19 declaration under Section 564(b)(1) of the Act, 21 U.S.C. section 360bbb-3(b)(1), unless the authorization is terminated or revoked.  Performed at [facility], [address], [place], [place] [address]      Studies: DG Chest 2 View Result Date: 04/01/2023 CLINICAL DATA:  Chest pain. EXAM: CHEST - 2 VIEW COMPARISON:  08/23/2022. FINDINGS: Bilateral lung fields are clear. Bilateral costophrenic angles are clear. Normal cardio-mediastinal silhouette. No acute osseous abnormalities. Old healed right  middle third clavicular fracture noted. The soft tissues are within normal limits. IMPRESSION: No active cardiopulmonary disease. Electronically Signed   By: Jules Schick M.D.   On: 04/01/2023 16:58    Scheduled Meds:  amLODipine  5 mg Oral Daily   enoxaparin (LOVENOX) injection  40 mg Subcutaneous Q24H   folic acid  1 mg Oral Daily   lidocaine  2 patch Transdermal Q24H   nicotine  7 mg Transdermal Daily   nirmatrelvir/ritonavir  3 tablet Oral BID   oxyCODONE  15 mg Oral Q6H   polyethylene glycol  17 g Oral Daily   Continuous Infusions:  Principal Problem:   Vasoocclusive sickle cell crisis (HCC)

## 2023-04-02 NOTE — Progress Notes (Signed)
BP 186/110 - took it twice. Pain currently 8/10. Notified Dr. Lazarus Salines. Per Dr. Lazarus Salines, he stated that it is ok with it in that range unless > 200s. He stated we can do pain control first and I've got some bp meds ordered for tomorrow. Will continue to monitor.

## 2023-04-03 ENCOUNTER — Ambulatory Visit: Payer: Self-pay | Admitting: Nurse Practitioner

## 2023-04-03 DIAGNOSIS — D5700 Hb-SS disease with crisis, unspecified: Secondary | ICD-10-CM | POA: Diagnosis not present

## 2023-04-03 LAB — HEPATITIS B CORE ANTIBODY, TOTAL: HEP B CORE AB: NEGATIVE

## 2023-04-03 NOTE — Progress Notes (Signed)
Pt received discharge instructions at 1026 and verbalized understanding. IV d/c'd. Pt stated that his ride wouldn't be here until around 2 or 3. At around noon, pt reported to tech that he had vomited and did not feel like he was ready for discharge. Dr. Mikeal Hawthorne made aware and stated he will hold off until tomorrow 2/19. Dr. Mikeal Hawthorne made aware that he no longer had an IV and he stated ok to leave out unless pt is actively vomiting. Pt made aware that he will discharge tomorrow

## 2023-04-03 NOTE — Discharge Summary (Signed)
 Physician Discharge Summary   Patient: Dean Webb MRN: [id number] DOB: Jan 24, 1976  Admit date:     04/01/2023  Discharge date: 04/03/2023  Discharge Physician: Lonia Blood   PCP: Ivonne Andrew, NP   Recommendations at discharge:   Follow-up with your PCP and complete treatment  Discharge Diagnoses: Principal Problem:   Vasoocclusive sickle cell crisis (HCC)  Resolved Problems:   * No resolved hospital problems. [facility] Course: Patient admitted with sickle cell pain crisis as well as COVID-19 infection.  Patient was asymptomatic with the COVID-19 infection.  Was admitted and started on Dilaudid pushes he denies PCA.  Also IV fluids and supportive care.  Patient was not given remdesivir or any COVID-19 specific treatments.  He apparently ran out of his medications at home and could not get any pain medicine.  Ultimately we prescribed his home regimen.  At time of discharge patient's pain was back to normal.  Symptoms were getting better and he was discharged to pick up his prescriptions and follow-up with PCP.  Assessment and Plan: No notes have been filed under this hospital service. Service: Hospitalist        Consultants: None Procedures performed: Chest x-ray Disposition: Home Diet recommendation:  Discharge Diet Orders (From admission, onward)     Start     Ordered   04/03/23 0000  Diet - low sodium heart healthy        04/03/23 0845           Regular diet DISCHARGE MEDICATION: Allergies as of 04/03/2023   No Known Allergies      Medication List     TAKE these medications    albuterol 108 (90 Base) MCG/ACT inhaler Commonly known as: VENTOLIN HFA TAKE 2 PUFFS BY MOUTH EVERY 6 HOURS AS NEEDED FOR WHEEZE OR SHORTNESS OF BREATH   amLODipine 10 MG tablet Commonly known as: NORVASC Take 1 tablet (10 mg total) by mouth daily.   benzonatate 100 MG capsule Commonly known as: TESSALON Take 1 capsule (100 mg total) by mouth 3 (three) times daily  as needed for cough.   cetirizine 10 MG tablet Commonly known as: ZYRTEC TAKE 1 TABLET BY MOUTH EVERY DAY AS NEEDED FOR ALLERGY   cloNIDine 0.1 MG tablet Commonly known as: CATAPRES TAKE 1 TABLET BY MOUTH 2 TIMES DAILY.   DULoxetine 30 MG capsule Commonly known as: Cymbalta Take 1 capsule (30 mg total) by mouth daily.   gabapentin 300 MG capsule Commonly known as: NEURONTIN Take 1 capsule (300 mg total) by mouth 2 (two) times daily.   losartan-hydrochlorothiazide 100-25 MG tablet Commonly known as: HYZAAR Take 1 tablet by mouth daily.   metoprolol succinate 50 MG 24 hr tablet Commonly known as: TOPROL-XL Take 1 tablet (50 mg total) by mouth daily. Take with or immediately following a meal.   montelukast 10 MG tablet Commonly known as: SINGULAIR TAKE 1 TABLET BY MOUTH EVERYDAY AT BEDTIME   nicotine 21 mg/24hr patch Commonly known as: NICODERM CQ - dosed in mg/24 hours Place 1 patch (21 mg total) onto the skin daily.   oxyCODONE 15 MG immediate release tablet Commonly known as: ROXICODONE Take 1 tablet (15 mg total) by mouth every 8 (eight) hours as needed for pain. What changed:  medication strength how much to take when to take this   pantoprazole 40 MG tablet Commonly known as: PROTONIX Take 1 tablet (40 mg total) by mouth daily.   tiZANidine 4 MG tablet Commonly known as: Zanaflex Take  1 tablet (4 mg total) by mouth every 6 (six) hours as needed for muscle spasms.       ASK your doctor about these medications    molnupiravir EUA 200 mg Caps capsule Commonly known as: LAGEVRIO Take 4 capsules (800 mg total) by mouth 2 (two) times daily for 5 days. Ask about: Should I take this medication?        Discharge Exam: Filed Weights   04/01/23 2247 04/02/23 0000  Weight: 88.6 kg 88.6 kg   Constitutional: NAD, calm, comfortable Eyes: PERRL, lids and conjunctivae normal ENMT: Mucous membranes are moist. Posterior pharynx clear of any exudate or  lesions.Normal dentition.  Neck: normal, supple, no masses, no thyromegaly Respiratory: clear to auscultation bilaterally, no wheezing, no crackles. Normal respiratory effort. No accessory muscle use.  Cardiovascular: Regular rate and rhythm, no murmurs / rubs / gallops. No extremity edema. 2+ pedal pulses. No carotid bruits.  Abdomen: no tenderness, no masses palpated. No hepatosplenomegaly. Bowel sounds positive.  Musculoskeletal: Good range of motion, no joint swelling or tenderness, Skin: no rashes, lesions, ulcers. No induration Neurologic: CN 2-12 grossly intact. Sensation intact, DTR normal. Strength 5/5 in all 4.  Psychiatric: Normal judgment and insight. Alert and oriented x 3. Normal mood   Condition at discharge: good  The results of significant diagnostics from this hospitalization (including imaging, microbiology, ancillary and laboratory) are listed below for reference.   Imaging Studies: DG Chest 2 View Result Date: 04/01/2023 CLINICAL DATA:  Chest pain. EXAM: CHEST - 2 VIEW COMPARISON:  08/23/2022. FINDINGS: Bilateral lung fields are clear. Bilateral costophrenic angles are clear. Normal cardio-mediastinal silhouette. No acute osseous abnormalities. Old healed right middle third clavicular fracture noted. The soft tissues are within normal limits. IMPRESSION: No active cardiopulmonary disease. Electronically Signed   By: Jules Schick M.D.   On: 04/01/2023 16:58    Microbiology: Results for orders placed or performed during the hospital encounter of 04/01/23  Resp panel by RT-PCR (RSV, Flu A&B, Covid) Anterior Nasal Swab     Status: Abnormal   Collection Time: 04/01/23  3:41 PM   Specimen: Anterior Nasal Swab  Result Value Ref Range Status   SARS Coronavirus 2 by RT PCR POSITIVE (A) NEGATIVE Final    Comment: (NOTE) SARS-CoV-2 target nucleic acids are DETECTED.  The SARS-CoV-2 RNA is generally detectable in upper respiratory specimens during the acute phase of  infection. Positive results are indicative of the presence of the identified virus, but do not rule out bacterial infection or co-infection with other pathogens not detected by the test. Clinical correlation with patient history and other diagnostic information is necessary to determine patient infection status. The expected result is Negative.  Fact Sheet for Patients: BloggerCourse.com  Fact Sheet for Healthcare Providers: SeriousBroker.it  This test is not yet approved or cleared by the [place] FDA and  has been authorized for detection and/or diagnosis of SARS-CoV-2 by FDA under an Emergency Use Authorization (EUA).  This EUA will remain in effect (meaning this test can be used) for the duration of  the COVID-19 declaration under Section 564(b)(1) of the A ct, 21 U.S.C. section 360bbb-3(b)(1), unless the authorization is terminated or revoked sooner.     Influenza A by PCR NEGATIVE NEGATIVE Final   Influenza B by PCR NEGATIVE NEGATIVE Final    Comment: (NOTE) The Xpert Xpress SARS-CoV-2/FLU/RSV plus assay is intended as an aid in the diagnosis of influenza from Nasopharyngeal swab specimens and should not be used as a sole  basis for treatment. Nasal washings and aspirates are unacceptable for Xpert Xpress SARS-CoV-2/FLU/RSV testing.  Fact Sheet for Patients: BloggerCourse.com  Fact Sheet for Healthcare Providers: SeriousBroker.it  This test is not yet approved or cleared by the [place] FDA and has been authorized for detection and/or diagnosis of SARS-CoV-2 by FDA under an Emergency Use Authorization (EUA). This EUA will remain in effect (meaning this test can be used) for the duration of the COVID-19 declaration under Section 564(b)(1) of the Act, 21 U.S.C. section 360bbb-3(b)(1), unless the authorization is terminated or revoked.     Resp Syncytial Virus by  PCR NEGATIVE NEGATIVE Final    Comment: (NOTE) Fact Sheet for Patients: BloggerCourse.com  Fact Sheet for Healthcare Providers: SeriousBroker.it  This test is not yet approved or cleared by the [place] FDA and has been authorized for detection and/or diagnosis of SARS-CoV-2 by FDA under an Emergency Use Authorization (EUA). This EUA will remain in effect (meaning this test can be used) for the duration of the COVID-19 declaration under Section 564(b)(1) of the Act, 21 U.S.C. section 360bbb-3(b)(1), unless the authorization is terminated or revoked.  Performed at [facility], [address], [place], [place] [address]     Labs: CBC: No results for input(s): "WBC", "NEUTROABS", "HGB", "HCT", "MCV", "PLT" in the last 168 hours.  Basic Metabolic Panel: No results for input(s): "NA", "K", "CL", "CO2", "GLUCOSE", "BUN", "CREATININE", "CALCIUM", "MG", "PHOS" in the last 168 hours.  Liver Function Tests: No results for input(s): "AST", "ALT", "ALKPHOS", "BILITOT", "PROT", "ALBUMIN" in the last 168 hours.  CBG: No results for input(s): "GLUCAP" in the last 168 hours.  Discharge time spent: greater than 30 minutes.  SignedLonia Blood, MD [facility] 04/03/2023

## 2023-04-03 NOTE — Plan of Care (Signed)
  Problem: Education: Goal: Knowledge of risk factors and measures for prevention of condition will improve Outcome: Progressing   Problem: Coping: Goal: Psychosocial and spiritual needs will be supported Outcome: Progressing   Problem: Respiratory: Goal: Will maintain a patent airway Outcome: Progressing Goal: Complications related to the disease process, condition or treatment will be avoided or minimized Outcome: Progressing   Problem: Education: Goal: Knowledge of General Education information will improve Description: Including pain rating scale, medication(s)/side effects and non-pharmacologic comfort measures Outcome: Progressing   Problem: Health Behavior/Discharge Planning: Goal: Ability to manage health-related needs will improve Outcome: Progressing   Problem: Clinical Measurements: Goal: Ability to maintain clinical measurements within normal limits will improve Outcome: Progressing Goal: Will remain free from infection Outcome: Progressing Goal: Diagnostic test results will improve Outcome: Progressing Goal: Respiratory complications will improve Outcome: Progressing Goal: Cardiovascular complication will be avoided Outcome: Progressing   Problem: Activity: Goal: Risk for activity intolerance will decrease Outcome: Progressing   Problem: Nutrition: Goal: Adequate nutrition will be maintained Outcome: Progressing   Problem: Coping: Goal: Level of anxiety will decrease Outcome: Progressing   Problem: Elimination: Goal: Will not experience complications related to bowel motility Outcome: Progressing Goal: Will not experience complications related to urinary retention Outcome: Progressing   Problem: Pain Managment: Goal: General experience of comfort will improve and/or be controlled Outcome: Progressing   Problem: Safety: Goal: Ability to remain free from injury will improve Outcome: Progressing   Problem: Skin Integrity: Goal: Risk for impaired  skin integrity will decrease Outcome: Progressing   Problem: Education: Goal: Knowledge of vaso-occlusive preventative measures will improve Outcome: Progressing Goal: Awareness of infection prevention will improve Outcome: Progressing Goal: Awareness of signs and symptoms of anemia will improve Outcome: Progressing Goal: Long-term complications will improve Outcome: Progressing   Problem: Self-Care: Goal: Ability to incorporate actions that prevent/reduce pain crisis will improve Outcome: Progressing   Problem: Bowel/Gastric: Goal: Gut motility will be maintained Outcome: Progressing   Problem: Tissue Perfusion: Goal: Complications related to inadequate tissue perfusion will be avoided or minimized Outcome: Progressing   Problem: Respiratory: Goal: Pulmonary complications will be avoided or minimized Outcome: Progressing Goal: Acute Chest Syndrome will be identified early to prevent complications Outcome: Progressing   Problem: Fluid Volume: Goal: Ability to maintain a balanced intake and output will improve Outcome: Progressing   Problem: Sensory: Goal: Pain level will decrease with appropriate interventions Outcome: Progressing   Problem: Health Behavior: Goal: Postive changes in compliance with treatment and prescription regimens will improve Outcome: Progressing

## 2023-04-04 ENCOUNTER — Telehealth: Payer: Self-pay

## 2023-04-04 ENCOUNTER — Other Ambulatory Visit: Payer: Self-pay

## 2023-04-04 NOTE — Telephone Encounter (Signed)
Copied from CRM [id number]. Topic: Appointments - Scheduling Inquiry for Clinic >> Apr 04, 2023  8:42 AM Dean Webb wrote: Reason for CRM: Pt was discharged Hospital 02/18 and needs a follow up visit. Pt does not know how to do video visit and would like in person. Pt also needs med refill.   Pt callback [phone] Pt was call. [facility]

## 2023-04-04 NOTE — Telephone Encounter (Signed)
Pt is requesting  Please advise [facility]

## 2023-04-15 NOTE — Hospital Course (Signed)
 Patient admitted with sickle cell pain crisis as well as COVID-19 infection.  Patient was asymptomatic with the COVID-19 infection.  Was admitted and started on Dilaudid pushes he denies PCA.  Also IV fluids and supportive care.  Patient was not given remdesivir or any COVID-19 specific treatments.  He apparently ran out of his medications at home and could not get any pain medicine.  Ultimately we prescribed his home regimen.  At time of discharge patient's pain was back to normal.  Symptoms were getting better and he was discharged to pick up his prescriptions and follow-up with PCP.

## 2023-04-16 ENCOUNTER — Ambulatory Visit: Payer: Self-pay | Admitting: Nurse Practitioner

## 2023-04-30 ENCOUNTER — Other Ambulatory Visit (HOSPITAL_COMMUNITY)
Admission: EM | Admit: 2023-04-30 | Discharge: 2023-05-06 | Disposition: A | Discharge status: Home / Self Care | Attending: Psychiatry | Admitting: Psychiatry

## 2023-04-30 ENCOUNTER — Ambulatory Visit (HOSPITAL_COMMUNITY)
Admission: EM | Admit: 2023-04-30 | Discharge: 2023-04-30 | Disposition: A | Discharge status: Other Acute Inpatient Hospital

## 2023-04-30 DIAGNOSIS — Z5900 Homelessness unspecified: Secondary | ICD-10-CM | POA: Insufficient documentation

## 2023-04-30 DIAGNOSIS — F419 Anxiety disorder, unspecified: Secondary | ICD-10-CM | POA: Diagnosis not present

## 2023-04-30 DIAGNOSIS — F339 Major depressive disorder, recurrent, unspecified: Secondary | ICD-10-CM | POA: Insufficient documentation

## 2023-04-30 DIAGNOSIS — F1910 Other psychoactive substance abuse, uncomplicated: Secondary | ICD-10-CM | POA: Diagnosis present

## 2023-04-30 DIAGNOSIS — F1410 Cocaine abuse, uncomplicated: Secondary | ICD-10-CM | POA: Diagnosis present

## 2023-04-30 DIAGNOSIS — Z716 Tobacco abuse counseling: Secondary | ICD-10-CM

## 2023-04-30 DIAGNOSIS — R4589 Other symptoms and signs involving emotional state: Secondary | ICD-10-CM

## 2023-04-30 LAB — COMPREHENSIVE METABOLIC PANEL
ALT: 23 U/L (ref 0–44)
AST: 29 U/L (ref 15–41)
Albumin: 4.2 g/dL (ref 3.5–5.0)
Alkaline Phosphatase: 99 U/L (ref 38–126)
Anion gap: 9 (ref 5–15)
BUN: 8 mg/dL (ref 6–20)
CO2: 27 mmol/L (ref 22–32)
Calcium: 11 mg/dL — ABNORMAL HIGH (ref 8.9–10.3)
Chloride: 96 mmol/L — ABNORMAL LOW (ref 98–111)
Creatinine, Ser: 1.15 mg/dL (ref 0.61–1.24)
GFR, Estimated: >60 mL/min (ref >60)
Glucose, Bld: 77 mg/dL (ref 70–99)
Potassium: 4.4 mmol/L (ref 3.5–5.1)
Sodium: 132 mmol/L — ABNORMAL LOW (ref 135–145)
Total Bilirubin: 3.2 mg/dL — ABNORMAL HIGH (ref 0.0–1.2)
Total Protein: 7.2 g/dL (ref 6.5–8.1)

## 2023-04-30 LAB — CBC WITH DIFFERENTIAL/PLATELET
Abs Immature Granulocytes: 0.07 10*3/uL (ref 0.00–0.07)
Basophils Absolute: 0.1 10*3/uL (ref 0.0–0.1)
Basophils Relative: 1 %
Eosinophils Absolute: 0.7 10*3/uL — ABNORMAL HIGH (ref 0.0–0.5)
Eosinophils Relative: 5 %
HCT: 38.2 % — ABNORMAL LOW (ref 39.0–52.0)
Hemoglobin: 14.9 g/dL (ref 13.0–17.0)
Immature Granulocytes: 1 %
Lymphocytes Relative: 26 %
Lymphs Abs: 3.6 10*3/uL (ref 0.7–4.0)
MCH: 30.2 pg (ref 26.0–34.0)
MCHC: 37.5 g/dL — ABNORMAL HIGH (ref 30.0–36.0)
MCV: 77.5 fL — ABNORMAL LOW (ref 80.0–100.0)
Monocytes Absolute: 1.8 10*3/uL — ABNORMAL HIGH (ref 0.1–1.0)
Monocytes Relative: 13 %
Neutro Abs: 7.7 10*3/uL (ref 1.7–7.7)
Neutrophils Relative %: 54 %
Platelets: 314 10*3/uL (ref 150–400)
RBC: 4.93 MIL/uL (ref 4.22–5.81)
RDW: 15.1 % (ref 11.5–15.5)
WBC: 13.9 10*3/uL — ABNORMAL HIGH (ref 4.0–10.5)
nRBC: 1 % — ABNORMAL HIGH (ref 0.0–0.2)

## 2023-04-30 LAB — TSH: TSH: 3.54 u[IU]/mL (ref 0.350–4.500)

## 2023-04-30 LAB — ETHANOL: Alcohol, Ethyl (B): <10 mg/dL (ref <10)

## 2023-04-30 MED ORDER — HYDROXYZINE HCL 25 MG PO TABS
25.0000 mg | ORAL_TABLET | Freq: Four times a day (QID) | ORAL | Status: AC | PRN
  Administered 2023-04-30 – 2023-05-04 (×3): 25 mg via ORAL
  Filled 2023-04-30 (×4): qty 1

## 2023-04-30 MED ORDER — CLONIDINE HCL 0.1 MG PO TABS
0.1000 mg | ORAL_TABLET | ORAL | Status: AC
  Administered 2023-05-02 – 2023-05-04 (×4): 0.1 mg via ORAL
  Filled 2023-04-30 (×4): qty 1

## 2023-04-30 MED ORDER — OLANZAPINE 5 MG PO TBDP: 5.0000 mg | ORAL_TABLET | Freq: Three times a day (TID) | ORAL | Status: DC | PRN

## 2023-04-30 MED ORDER — LOPERAMIDE HCL 2 MG PO CAPS: 2.0000 mg | ORAL_CAPSULE | ORAL | Status: AC | PRN

## 2023-04-30 MED ORDER — OLANZAPINE 10 MG IM SOLR: 10.0000 mg | Freq: Three times a day (TID) | INTRAMUSCULAR | Status: DC | PRN

## 2023-04-30 MED ORDER — NAPROXEN 500 MG PO TABS
500.0000 mg | ORAL_TABLET | Freq: Two times a day (BID) | ORAL | Status: AC | PRN
  Administered 2023-05-03: 500 mg via ORAL
  Filled 2023-04-30 (×2): qty 1

## 2023-04-30 MED ORDER — MAGNESIUM HYDROXIDE 400 MG/5ML PO SUSP: 30.0000 mL | Freq: Every day | ORAL | Status: DC | PRN

## 2023-04-30 MED ORDER — ALUM & MAG HYDROXIDE-SIMETH 200-200-20 MG/5ML PO SUSP: 30.0000 mL | ORAL | Status: DC | PRN

## 2023-04-30 MED ORDER — OLANZAPINE 10 MG IM SOLR: 5.0000 mg | Freq: Three times a day (TID) | INTRAMUSCULAR | Status: DC | PRN

## 2023-04-30 MED ORDER — CLONIDINE HCL 0.1 MG PO TABS
0.1000 mg | ORAL_TABLET | Freq: Four times a day (QID) | ORAL | Status: AC
  Administered 2023-04-30 – 2023-05-02 (×8): 0.1 mg via ORAL
  Filled 2023-04-30 (×8): qty 1

## 2023-04-30 MED ORDER — METHOCARBAMOL 500 MG PO TABS
500.0000 mg | ORAL_TABLET | Freq: Three times a day (TID) | ORAL | Status: AC | PRN
  Filled 2023-04-30: qty 1

## 2023-04-30 MED ORDER — ONDANSETRON 4 MG PO TBDP: 4.0000 mg | ORAL_TABLET | Freq: Four times a day (QID) | ORAL | Status: AC | PRN

## 2023-04-30 MED ORDER — ACETAMINOPHEN 325 MG PO TABS: 650.0000 mg | ORAL_TABLET | Freq: Four times a day (QID) | ORAL | Status: DC | PRN

## 2023-04-30 MED ORDER — DICYCLOMINE HCL 20 MG PO TABS: 20.0000 mg | ORAL_TABLET | Freq: Four times a day (QID) | ORAL | Status: AC | PRN

## 2023-04-30 MED ORDER — CLONIDINE HCL 0.1 MG PO TABS
0.1000 mg | ORAL_TABLET | Freq: Every day | ORAL | Status: AC
  Administered 2023-05-05 – 2023-05-06 (×2): 0.1 mg via ORAL
  Filled 2023-04-30 (×2): qty 1

## 2023-04-30 NOTE — Progress Notes (Signed)
   04/30/23 1726  [facility] Triage Screening (Walk-ins at [facility] only)  How Did You Hear About [place]? Self  What Is the Reason for Your Visit/Call Today? Patient is a 47 year old male that is a voluntary walk in to [facility] requesting assistance with ongoing cocaine use. Patient denies any SI, HI or AVH. Patient denies the use of any other substances. Patient denies any prior mental health history. Patient denies any prior inpatient admissions associated with mental health or substance abuse. Patient reports he has been doing over 4 grams of cocaine (powder) daily for the last year. Patient states he is currently unemployed and is homeless.  How Long Has This Been Causing You Problems? > than 6 months  Have You Recently Had Any Thoughts About Hurting Yourself? No  Are You Planning to Commit Suicide/Harm Yourself At This time? No  Have you Recently Had Thoughts About Hurting Someone [name]? No  Are You Planning To Harm Someone At This Time? No  Physical Abuse Denies  Verbal Abuse Denies  Sexual Abuse Denies  Exploitation of patient/patient's resources Denies  Self-Neglect Denies  Possible abuse reported to: Other (Comment) (NA)  Are you currently experiencing any auditory, visual or other hallucinations? No  Have You Used Any Alcohol or Drugs in the Past 24 Hours? Yes  What Did You Use and How Much? Pt reports using over 3 grams of cocaine in the last 24 hours  Do you have any current medical co-morbidities that require immediate attention? No  Clinician description of patient physical appearance/behavior: Patient presents with a pleasant affect  What Do You Feel Would Help You the Most Today? Alcohol or Drug Use Treatment  If access to [facility] was not available, would you have sought care in the Emergency Department? No  Determination of Need Routine (7 days)  Options For Referral Outpatient Therapy

## 2023-04-30 NOTE — ED Provider Notes (Signed)
 Facility Based Crisis Admission H&P  Date: 05/01/23 Patient Name: Dean Webb MRN: [id number] Chief Complaint: want help with cocaine use  Diagnoses:  Final diagnoses:  Cocaine abuse (HCC)  Homelessness unspecified  Anxious appearance  Recurrent major depressive disorder, remission status unspecified (HCC)    HPI: Dean Webb 48 y/o male with a history of polysubstance use, homelessness.  Presented to [facility] voluntarily.  Per the patient he is trying to get some help with his cocaine abuse.  According to the patient he has tried outpatient services before but he relapsed a couple months ago patient also reports he is currently homeless.  Patient is also unemployed.  Denies seeing a therapist or a psychiatrist at this time.  Face-to-face evaluation of patient, patient is alert and oriented x 4, speech is clear, maintain eye contact.  Patient is fairly groomed, affect is flat does appear to be a little bit depressed.  Patient denies SI, HI, AVH or paranoia.  Denies alcohol use denies any other illicit drug use.  Reports he used cocaine on a regular every day reports he used 4 to 5 g daily.  Patient denies using any other substance.  Denies wanting to hurt himself or others denies access to guns.  Patient does not seem to be influenced by internal or external stimuli at this point.  Patient showed no sign of distress.  Per the patient he is looking for rehab treatment for a couple of days and then he is trying to get into a long treatment program in the [facility] area.  Discussed with patient how the [facility] admission works patient is in agreement with plan.  PHQ-9 was completed patient scored a 20  Recommend inpatient [facility]  PHQ 2-9:  Flowsheet Row ED from 04/30/2023 in [facility] Office Visit from 10/25/2022 in [facility]  Thoughts that you would be better off dead, or of hurting yourself in some way  Not at all Not at all  PHQ-9 Total Score 20 0       Flowsheet Row ED from 04/30/2023 in [facility] ED to Hosp-Admission (Discharged) from 04/01/2023 in [facility] ED to Hosp-Admission (Discharged) from [facility]23/2022 [facility]G [facility]  C-SSRS RISK CATEGORY No Risk No Risk No Risk         Total Time spent with patient: 20 minutes  Musculoskeletal  Strength & Muscle Tone: within normal limits Gait & Station: normal Patient leans: N/A  Psychiatric Specialty Exam  Presentation General Appearance:  Casual  Eye Contact: Fleeting  Speech: Clear and Coherent  Speech Volume: Normal  Handedness: Right   Mood and Affect  Mood: Anxious  Affect: Congruent   Thought Process  Thought Processes: Coherent  Descriptions of Associations:Intact  Orientation:Full (Time, Place and Person)  Thought Content:Logical    Hallucinations:Hallucinations: None  Ideas of Reference:None  Suicidal Thoughts:Suicidal Thoughts: No  Homicidal Thoughts:Homicidal Thoughts: No   Sensorium  Memory: Immediate Fair  Judgment: Poor  Insight: Fair   Chartered certified accountant: Fair  Attention Span: Fair  Recall: Fair  Fund of Knowledge:No data recorded Language: Good   Psychomotor Activity  Psychomotor Activity: Psychomotor Activity: Normal   Assets  Assets: Desire for Improvement; Housing; Resilience; Vocational/Educational   Sleep  Sleep: Sleep: Fair Number of Hours of Sleep: 6   Nutritional Assessment (For OBS and FBC admissions only) Has  the patient had a weight loss or gain of 10 pounds or more in the last 3 months?: No Has the patient had a decrease in food intake/or appetite?: No Does the patient have dental problems?: No Does the patient have eating habits or behaviors that may be indicators of an eating disorder including binging or inducing  vomiting?: No Has the patient recently lost weight without trying?: 0 Has the patient been eating poorly because of a decreased appetite?: 0 Malnutrition Screening Tool Score: 0    Physical Exam HENT:     Head: Normocephalic.     Nose: Nose normal.  Eyes:     Pupils: Pupils are equal, round, and reactive to light.  Cardiovascular:     Rate and Rhythm: Tachycardia present.  Pulmonary:     Effort: Pulmonary effort is normal.  Musculoskeletal:        General: Normal range of motion.     Cervical back: Normal range of motion.  Neurological:     General: No focal deficit present.     Mental Status: He is alert.  Psychiatric:        Mood and Affect: Mood normal.        Behavior: Behavior normal.        Thought Content: Thought content normal.        Judgment: Judgment normal.    Review of Systems  Constitutional: Negative.   HENT: Negative.    Eyes: Negative.   Respiratory: Negative.    Cardiovascular: Negative.   Gastrointestinal: Negative.   Genitourinary: Negative.   Musculoskeletal: Negative.   Skin: Negative.   Neurological: Negative.   Psychiatric/Behavioral:  Positive for depression and substance abuse. The patient is nervous/anxious.     Blood pressure (!) 156/104, pulse (!) 111, temperature 98.4 F (36.9 C), temperature source Oral, resp. rate 18, SpO2 99%. There is no height or weight on file to calculate BMI.  Past Psychiatric History: Polysubstance abuse, homelessness  Is the patient at risk to self? No  Has the patient been a risk to self in the past 6 months? No .    Has the patient been a risk to self within the distant past? No   Is the patient a risk to others? No   Has the patient been a risk to others in the past 6 months? No   Has the patient been a risk to others within the distant past? No   Past Medical History: See chart unknown Family History: Unknown Social History: Cocaine abuse  Last Labs:  Admission on 04/30/2023  Component Date  Value Ref Range Status   WBC 04/30/2023 13.9 (H)  4.0 - 10.5 K/uL Final   RBC 04/30/2023 4.93  4.22 - 5.81 MIL/uL Final   Hemoglobin 04/30/2023 14.9  13.0 - 17.0 g/dL Final   HCT 95/62/1308 38.2 (L)  39.0 - 52.0 % Final   MCV 04/30/2023 77.5 (L)  80.0 - 100.0 fL Final   MCH 04/30/2023 30.2  26.0 - 34.0 pg Final   MCHC 04/30/2023 37.5 (H)  30.0 - 36.0 g/dL Final   REPEATED TO VERIFY   RDW 04/30/2023 15.1  11.5 - 15.5 % Final   Platelets 04/30/2023 314  150 - 400 K/uL Final   nRBC 04/30/2023 1.0 (H)  0.0 - 0.2 % Final   Neutrophils Relative % 04/30/2023 54  % Final   Neutro Abs 04/30/2023 7.7  1.7 - 7.7 K/uL Final   Lymphocytes Relative 04/30/2023 26  % Final  Lymphs Abs 04/30/2023 3.6  0.7 - 4.0 K/uL Final   Monocytes Relative 04/30/2023 13  % Final   Monocytes Absolute 04/30/2023 1.8 (H)  0.1 - 1.0 K/uL Final   Eosinophils Relative 04/30/2023 5  % Final   Eosinophils Absolute 04/30/2023 0.7 (H)  0.0 - 0.5 K/uL Final   Basophils Relative 04/30/2023 1  % Final   Basophils Absolute 04/30/2023 0.1  0.0 - 0.1 K/uL Final   Immature Granulocytes 04/30/2023 1  % Final   Abs Immature Granulocytes 04/30/2023 0.07  0.00 - 0.07 K/uL Final   Pappenheimer Bodies 04/30/2023 PRESENT   Final   Polychromasia 04/30/2023 PRESENT   Final   Performed at [facility], [address] [place], [place] [address]   Sodium 04/30/2023 132 (L)  135 - 145 mmol/L Final   Potassium 04/30/2023 4.4  3.5 - 5.1 mmol/L Final   Chloride 04/30/2023 96 (L)  98 - 111 mmol/L Final   CO2 04/30/2023 27  22 - 32 mmol/L Final   Glucose, Bld 04/30/2023 77  70 - 99 mg/dL Final   Glucose reference range applies only to samples taken after fasting for at least 8 hours.   BUN 04/30/2023 8  6 - 20 mg/dL Final   Creatinine, Ser 04/30/2023 1.15  0.61 - 1.24 mg/dL Final   Calcium 08/65/7846 11.0 (H)  8.9 - 10.3 mg/dL Final   Total Protein 96/29/5284 7.2  6.5 - 8.1 g/dL Final   Albumin 13/24/4010 4.2  3.5 - 5.0 g/dL Final    AST 27/25/3664 29  15 - 41 U/L Final   ALT 04/30/2023 23  0 - 44 U/L Final   Alkaline Phosphatase 04/30/2023 99  38 - 126 U/L Final   Total Bilirubin 04/30/2023 3.2 (H)  0.0 - 1.2 mg/dL Final   GFR, Estimated 04/30/2023 >60  >60 mL/min Final   Comment: (NOTE) Calculated using the CKD-EPI Creatinine Equation (2021)    Anion gap 04/30/2023 9  5 - 15 Final   Performed at [facility], [address] [place], [place] [address]   Alcohol, Ethyl (B) 04/30/2023 <10  <10 mg/dL Final   Comment: (NOTE) Lowest detectable limit for serum alcohol is 10 mg/dL.  For medical purposes only. Performed at [facility], [address] [place], [place] [address]    TSH 04/30/2023 3.540  0.350 - 4.500 uIU/mL Final   Comment: Performed by a 3rd Generation assay with a functional sensitivity of <=0.01 uIU/mL. Performed at [facility], [address] [place], [place] [address]   Admission on 04/01/2023, Discharged on 04/03/2023  Component Date Value Ref Range Status   Sodium 04/01/2023 134 (L)  135 - 145 mmol/L Final   Potassium 04/01/2023 3.5  3.5 - 5.1 mmol/L Final   Chloride 04/01/2023 102  98 - 111 mmol/L Final   CO2 04/01/2023 19 (L)  22 - 32 mmol/L Final   Glucose, Bld 04/01/2023 158 (H)  70 - 99 mg/dL Final   Glucose reference range applies only to samples taken after fasting for at least 8 hours.   BUN 04/01/2023 9  6 - 20 mg/dL Final   Creatinine, Ser 04/01/2023 1.26 (H)  0.61 - 1.24 mg/dL Final   Calcium 64/33/2951 10.9 (H)  8.9 - 10.3 mg/dL Final   Total Protein 88/41/6606 7.5  6.5 - 8.1 g/dL Final   Albumin 30/16/0109 4.1  3.5 - 5.0 g/dL Final   AST 32/35/5732 71 (H)  15 -  41 U/L Final   ALT 04/01/2023 47 (H)  0 - 44 U/L Final   Alkaline Phosphatase 04/01/2023 83  38 - 126 U/L Final   Total Bilirubin 04/01/2023 1.8 (H)  0.0 - 1.2 mg/dL Final   GFR, Estimated 04/01/2023 >60  >60 mL/min Final   Comment: (NOTE) Calculated using the CKD-EPI Creatinine Equation (2021)     Anion gap 04/01/2023 13  5 - 15 Final   Performed at [facility], [address], [place], [place] [address]   WBC 04/01/2023 8.8  4.0 - 10.5 K/uL Final   RBC 04/01/2023 4.97  4.22 - 5.81 MIL/uL Final   Hemoglobin 04/01/2023 14.8  13.0 - 17.0 g/dL Final   HCT 47/82/9562 40.7  39.0 - 52.0 % Final   MCV 04/01/2023 81.9  80.0 - 100.0 fL Final   MCH 04/01/2023 29.8  26.0 - 34.0 pg Final   MCHC 04/01/2023 36.4 (H)  30.0 - 36.0 g/dL Final   RDW 13/09/6576 15.2  11.5 - 15.5 % Final   Platelets 04/01/2023 301  150 - 400 K/uL Final   nRBC 04/01/2023 1.9 (H)  0.0 - 0.2 % Final   Neutrophils Relative % 04/01/2023 67  % Final   Neutro Abs 04/01/2023 5.9  1.7 - 7.7 K/uL Final   Lymphocytes Relative 04/01/2023 11  % Final   Lymphs Abs 04/01/2023 1.0  0.7 - 4.0 K/uL Final   Monocytes Relative 04/01/2023 20  % Final   Monocytes Absolute 04/01/2023 1.8 (H)  0.1 - 1.0 K/uL Final   Eosinophils Relative 04/01/2023 0  % Final   Eosinophils Absolute 04/01/2023 0.0  0.0 - 0.5 K/uL Final   Basophils Relative 04/01/2023 1  % Final   Basophils Absolute 04/01/2023 0.1  0.0 - 0.1 K/uL Final   Immature Granulocytes 04/01/2023 1  % Final   Abs Immature Granulocytes 04/01/2023 0.05  0.00 - 0.07 K/uL Final   Performed at [facility], [address] [place], [place] [address]   Retic Ct Pct 04/01/2023 3.4 (H)  0.4 - 3.1 % Final   Comment: REPEATED TO VERIFY CONFIRMED BY MANUAL DILUTION    RBC. 04/01/2023 4.94  4.22 - 5.81 MIL/uL Final   Retic Count, Absolute 04/01/2023 170.0  19.0 - 186.0 K/uL Final   Immature Retic Fract 04/01/2023 13.3  2.3 - 15.9 % Final   Performed at [facility], [address], [place], [place] [address]   SARS Coronavirus 2 by RT PCR 04/01/2023 POSITIVE (A)  NEGATIVE Final   Comment: (NOTE) SARS-CoV-2 target nucleic acids are DETECTED.  The SARS-CoV-2 RNA is generally detectable in upper respiratory specimens during the  acute phase of infection. Positive results are indicative of the presence of the identified virus, but do not rule out bacterial infection or co-infection with other pathogens not detected by the test. Clinical correlation with patient history and other diagnostic information is necessary to determine patient infection status. The expected result is Negative.  Fact Sheet for Patients: BloggerCourse.com  Fact Sheet for Healthcare Providers: SeriousBroker.it  This test is not yet approved or cleared by the [place] FDA and  has been authorized for detection and/or diagnosis of SARS-CoV-2 by FDA under an Emergency Use Authorization (EUA).  This EUA will remain in effect (meaning this test can be used) for the duration of  the COVID-19 declaration under Section 564(b)(1) of the A  ct, 21 U.S.C. section 360bbb-3(b)(1), unless the authorization is terminated or revoked sooner.     Influenza A by PCR 04/01/2023 NEGATIVE  NEGATIVE Final   Influenza B by PCR 04/01/2023 NEGATIVE  NEGATIVE Final   Comment: (NOTE) The Xpert Xpress SARS-CoV-2/FLU/RSV plus assay is intended as an aid in the diagnosis of influenza from Nasopharyngeal swab specimens and should not be used as a sole basis for treatment. Nasal washings and aspirates are unacceptable for Xpert Xpress SARS-CoV-2/FLU/RSV testing.  Fact Sheet for Patients: BloggerCourse.com  Fact Sheet for Healthcare Providers: SeriousBroker.it  This test is not yet approved or cleared by the [place] FDA and has been authorized for detection and/or diagnosis of SARS-CoV-2 by FDA under an Emergency Use Authorization (EUA). This EUA will remain in effect (meaning this test can be used) for the duration of the COVID-19 declaration under Section 564(b)(1) of the Act, 21 U.S.C. section 360bbb-3(b)(1), unless the  authorization is terminated or revoked.     Resp Syncytial Virus by PCR 04/01/2023 NEGATIVE  NEGATIVE Final   Comment: (NOTE) Fact Sheet for Patients: BloggerCourse.com  Fact Sheet for Healthcare Providers: SeriousBroker.it  This test is not yet approved or cleared by the [place] FDA and has been authorized for detection and/or diagnosis of SARS-CoV-2 by FDA under an Emergency Use Authorization (EUA). This EUA will remain in effect (meaning this test can be used) for the duration of the COVID-19 declaration under Section 564(b)(1) of the Act, 21 U.S.C. section 360bbb-3(b)(1), unless the authorization is terminated or revoked.  Performed at [facility], [address], [place], [place] [address]    Troponin I (High Sensitivity) 04/01/2023 12  <18 ng/L Final   Comment: (NOTE) Elevated high sensitivity troponin I (hsTnI) values and significant  changes across serial measurements may suggest ACS but many other  chronic and acute conditions are known to elevate hsTnI results.  Refer to the "Links" section for chest pain algorithms and additional  guidance. Performed at [facility], [address], [place], [place] [address]    Opiates 04/01/2023 POSITIVE (A)  NONE DETECTED Final   Cocaine 04/01/2023 POSITIVE (A)  NONE DETECTED Final   Benzodiazepines 04/01/2023 NONE DETECTED  NONE DETECTED Final   Amphetamines 04/01/2023 NONE DETECTED  NONE DETECTED Final   Tetrahydrocannabinol 04/01/2023 NONE DETECTED  NONE DETECTED Final   Barbiturates 04/01/2023 NONE DETECTED  NONE DETECTED Final   Comment: (NOTE) DRUG SCREEN FOR MEDICAL PURPOSES ONLY.  IF CONFIRMATION IS NEEDED FOR ANY PURPOSE, NOTIFY LAB WITHIN 5 DAYS.  LOWEST DETECTABLE LIMITS FOR URINE DRUG SCREEN Drug Class                     Cutoff (ng/mL) Amphetamine and metabolites    1000 Barbiturate and metabolites     200 Benzodiazepine                 200 Opiates and metabolites        300 Cocaine and metabolites        300 THC                            50 Performed at [facility], [address] [place], [place] [address]    Sodium 04/02/2023 133 (L)  135 - 145 mmol/L Final   Potassium 04/02/2023 3.6  3.5 - 5.1 mmol/L Final   Chloride 04/02/2023 103  98 - 111 mmol/L Final  CO2 04/02/2023 21 (L)  22 - 32 mmol/L Final   Glucose, Bld 04/02/2023 132 (H)  70 - 99 mg/dL Final   Glucose reference range applies only to samples taken after fasting for at least 8 hours.   BUN 04/02/2023 7  6 - 20 mg/dL Final   Creatinine, Ser 04/02/2023 0.91  0.61 - 1.24 mg/dL Final   Calcium 16/11/9602 10.0  8.9 - 10.3 mg/dL Final   GFR, Estimated 04/02/2023 >60  >60 mL/min Final   Comment: (NOTE) Calculated using the CKD-EPI Creatinine Equation (2021)    Anion gap 04/02/2023 9  5 - 15 Final   Performed at [facility], [address], [place], [place] [address]   WBC 04/02/2023 9.2  4.0 - 10.5 K/uL Final   RBC 04/02/2023 4.71  4.22 - 5.81 MIL/uL Final   Hemoglobin 04/02/2023 14.0  13.0 - 17.0 g/dL Final   HCT 11/91/4782 38.0 (L)  39.0 - 52.0 % Final   MCV 04/02/2023 80.7  80.0 - 100.0 fL Final   MCH 04/02/2023 29.7  26.0 - 34.0 pg Final   MCHC 04/02/2023 36.8 (H)  30.0 - 36.0 g/dL Final   RDW 95/62/1308 14.6  11.5 - 15.5 % Final   Platelets 04/02/2023 247  150 - 400 K/uL Final   nRBC 04/02/2023 4.4 (H)  0.0 - 0.2 % Final   Performed at [facility], [address], [place], [place] [address]   Magnesium 04/02/2023 2.0  1.7 - 2.4 mg/dL Final   Performed at [facility], [address] [place], [place] [address]   Phosphorus 04/02/2023 2.4 (L)  2.5 - 4.6 mg/dL Final   Performed at [facility], [address] [place], [place] [address]   Alcohol, Ethyl (B) 04/02/2023 <10  <10 mg/dL Final   Comment: (NOTE) Lowest  detectable limit for serum alcohol is 10 mg/dL.  For medical purposes only. Performed at [facility], [address], [place], [place] [address]    Total Protein 04/02/2023 6.7  6.5 - 8.1 g/dL Final   Albumin 12/10/2534 3.9  3.5 - 5.0 g/dL Final   AST 64/40/3474 213 (H)  15 - 41 U/L Final   ALT 04/02/2023 104 (H)  0 - 44 U/L Final   Alkaline Phosphatase 04/02/2023 86  38 - 126 U/L Final   Total Bilirubin 04/02/2023 2.0 (H)  0.0 - 1.2 mg/dL Final   Bilirubin, Direct 04/02/2023 0.4 (H)  0.0 - 0.2 mg/dL Final   Indirect Bilirubin 04/02/2023 1.6 (H)  0.3 - 0.9 mg/dL Final   Performed at [facility], [address] [place], [place] [address]   HEP B CORE AB 04/02/2023 Negative  Negative Final   Comment: (NOTE) Performed At: [facility] [address] [place], [place] [id number] Jolene Schimke MD RJ:[id number]    Hep B S Ab 04/02/2023 NON REACTIVE  NON REACTIVE Final   Comment: (NOTE) Inconsistent with immunity, less than 10 mIU/mL.  Performed at [facility], [address] [place], [place] [address]    Hepatitis B Surface Ag 04/02/2023 NON REACTIVE  NON REACTIVE Final   Performed at [facility], [address] [place], [place] [address]   HCV Ab 04/02/2023 NON REACTIVE  NON REACTIVE Final   Comment: (NOTE) Nonreactive HCV antibody screen is consistent with no HCV infections,  unless recent infection is suspected or other evidence exists to indicate HCV infection.  Performed at  [facility], [address] [place], [place] [address]   Office Visit on 11/23/2022  Component Date Value Ref Range Status   Amphetamines, IA 11/23/2022 Negative  Cutoff:50 ng/mL Final   Barbiturates, IA 11/23/2022 Negative  Cutoff:0.1 ug/mL Final   Benzodiazepines, IA 11/23/2022 Negative  Cutoff:20 ng/mL Final   Cocaine & Metabolite, IA 11/23/2022 Negative  Cutoff:25 ng/mL Final   Phencyclidine, IA 11/23/2022 Negative  Cutoff:8  ng/mL Final   THC(Marijuana) Metabolite, IA 11/23/2022 Negative  Cutoff:5 ng/mL Final   Opiates, IA 11/23/2022 Negative  Cutoff:5 ng/mL Final   Oxycodones, IA 11/23/2022 Negative  Cutoff:5 ng/mL Final   Methadone, IA 11/23/2022 Negative  Cutoff:25 ng/mL Final   Propoxyphene, IA 11/23/2022 Negative  Cutoff:50 ng/mL Final   Comment: This test was developed and its performance characteristics determined by [facility].  It has not been cleared or approved by the Food and Drug Administration.   Lab on 11/09/2022  Component Date Value Ref Range Status   Glucose 11/09/2022 86  70 - 99 mg/dL Final   BUN 19/14/7829 13  6 - 24 mg/dL Final   Creatinine, Ser 11/09/2022 1.42 (H)  0.76 - 1.27 mg/dL Final   eGFR 56/21/3086 62  >59 mL/min/1.73 Final   BUN/Creatinine Ratio 11/09/2022 9  9 - 20 Final   Sodium 11/09/2022 135  134 - 144 mmol/L Final   Potassium 11/09/2022 4.3  3.5 - 5.2 mmol/L Final   Chloride 11/09/2022 101  96 - 106 mmol/L Final   CO2 11/09/2022 22  20 - 29 mmol/L Final   Calcium 11/09/2022 10.4 (H)  8.7 - 10.2 mg/dL Final   Total Protein 57/84/6962 6.9  6.0 - 8.5 g/dL Final   Albumin 95/28/4132 4.3  4.1 - 5.1 g/dL Final   Globulin, Total 11/09/2022 2.6  1.5 - 4.5 g/dL Final   Bilirubin Total 11/09/2022 1.2  0.0 - 1.2 mg/dL Final   Alkaline Phosphatase 11/09/2022 93  44 - 121 IU/L Final   AST 11/09/2022 25  0 - 40 IU/L Final   ALT 11/09/2022 21  0 - 44 IU/L Final   Ferritin 11/09/2022 128  30 - 400 ng/mL Final   Vit D, 25-Hydroxy 11/09/2022 23.7 (L)  30.0 - 100.0 ng/mL Final   Comment: Vitamin D deficiency has been defined by the Institute of Medicine and an Endocrine Society practice guideline as a level of serum 25-OH vitamin D less than 20 ng/mL (1,2). The Endocrine Society went on to further define vitamin D insufficiency as a level between 21 and 29 ng/mL (2). 1. IOM (Institute of Medicine). 2010. Dietary reference    intakes for calcium and D. [place]: The    [employer]. 2. Holick MF, [place], Bischoff-Ferrari HA, et al.    Evaluation, treatment, and prevention of vitamin D    deficiency: an Endocrine Society clinical practice    guideline. JCEM. 2011 Jul; 96(7):1911-30.    WBC 11/09/2022 13.0 (H)  3.4 - 10.8 x10E3/uL Final   RBC 11/09/2022 4.68  4.14 - 5.80 x10E6/uL Final   Hemoglobin 11/09/2022 14.4  13.0 - 17.7 g/dL Final   Hematocrit 44/02/270 41.0  37.5 - 51.0 % Final   MCV 11/09/2022 88  79 - 97 fL Final   MCH 11/09/2022 30.8  26.6 - 33.0 pg Final   MCHC 11/09/2022 35.1  31.5 - 35.7 g/dL Final   RDW 53/66/4403 15.7 (H)  11.6 - 15.4 % Final   Platelets 11/09/2022 303  150 - 450  x10E3/uL Final   Neutrophils 11/09/2022 52  Not Estab. % Final   Lymphs 11/09/2022 32  Not Estab. % Final   Monocytes 11/09/2022 12  Not Estab. % Final   Eos 11/09/2022 3  Not Estab. % Final   Basos 11/09/2022 1  Not Estab. % Final   Neutrophils Absolute 11/09/2022 6.7  1.4 - 7.0 x10E3/uL Final   Lymphocytes Absolute 11/09/2022 4.2 (H)  0.7 - 3.1 x10E3/uL Final   Monocytes Absolute 11/09/2022 1.6 (H)  0.1 - 0.9 x10E3/uL Final   EOS (ABSOLUTE) 11/09/2022 0.4  0.0 - 0.4 x10E3/uL Final   Basophils Absolute 11/09/2022 0.1  0.0 - 0.2 x10E3/uL Final   Immature Granulocytes 11/09/2022 0  Not Estab. % Final   Immature Grans (Abs) 11/09/2022 0.0  0.0 - 0.1 x10E3/uL Final   NRBC 11/09/2022 1 (H)  0 - 0 % Final   Retic Ct Pct 11/09/2022 3.6 (H)  0.6 - 2.6 % Final   Ethyl Alcohol, Enz 11/09/2022 Negative  Cutoff:0.020 gm/dL Final   Amphetamines, IA 11/09/2022 Negative  Cutoff:50 ng/mL Final   Barbiturates, IA 11/09/2022 Negative  Cutoff:0.1 ug/mL Final   Benzodiazepines, IA 11/09/2022 Negative  Cutoff:20 ng/mL Final   Cocaine & Metabolite, IA 11/09/2022 ++POSITIVE++ (A)  Cutoff:25 ng/mL Final   Phencyclidine, IA 11/09/2022 Negative  Cutoff:8 ng/mL Final   THC(Marijuana) Metabolite, IA 11/09/2022 Negative  Cutoff:5 ng/mL Final   Opiates, IA 11/09/2022 Negative   Cutoff:5 ng/mL Final   Oxycodones, IA 11/09/2022 Negative  Cutoff:5 ng/mL Final   Methadone, IA 11/09/2022 Negative  Cutoff:25 ng/mL Final   Propoxyphene, IA 11/09/2022 Negative  Cutoff:50 ng/mL Final   Comment: This test was developed and its performance characteristics determined by [facility].  It has not been cleared or approved by the Food and Drug Administration.    Cocaine Confirmation 11/09/2022 Positive   Final   Cocaine 11/09/2022 Negative  ng/mL Final   Benzoylecgonine 11/09/2022 22  ng/mL Final   Confirmation threshold: 10 ng/mL    Allergies: Patient has no known allergies.  Medications:  Facility Ordered Medications  Medication   cloNIDine (CATAPRES) tablet 0.3 mg   acetaminophen (TYLENOL) tablet 650 mg   alum & mag hydroxide-simeth (MAALOX/MYLANTA) 200-200-20 MG/5ML suspension 30 mL   magnesium hydroxide (MILK OF MAGNESIA) suspension 30 mL   dicyclomine (BENTYL) tablet 20 mg   hydrOXYzine (ATARAX) tablet 25 mg   loperamide (IMODIUM) capsule 2-4 mg   methocarbamol (ROBAXIN) tablet 500 mg   naproxen (NAPROSYN) tablet 500 mg   ondansetron (ZOFRAN-ODT) disintegrating tablet 4 mg   OLANZapine zydis (ZYPREXA) disintegrating tablet 5 mg   OLANZapine (ZYPREXA) injection 5 mg   OLANZapine (ZYPREXA) injection 10 mg   cloNIDine (CATAPRES) tablet 0.1 mg   Followed by   Melene Muller ON 05/02/2023] cloNIDine (CATAPRES) tablet 0.1 mg   Followed by   Melene Muller ON 05/05/2023] cloNIDine (CATAPRES) tablet 0.1 mg   PTA Medications  Medication Sig   amLODipine (NORVASC) 10 MG tablet Take 1 tablet (10 mg total) by mouth daily. (Patient not taking: Reported on 04/01/2023)   pantoprazole (PROTONIX) 40 MG tablet Take 1 tablet (40 mg total) by mouth daily. (Patient not taking: Reported on 04/01/2023)   montelukast (SINGULAIR) 10 MG tablet TAKE 1 TABLET BY MOUTH EVERYDAY AT BEDTIME (Patient not taking: Reported on 04/01/2023)   metoprolol succinate (TOPROL-XL) 50 MG 24 hr tablet Take 1 tablet (50 mg  total) by mouth daily. Take with or immediately following a meal. (Patient not taking: Reported on 04/01/2023)  cetirizine (ZYRTEC) 10 MG tablet TAKE 1 TABLET BY MOUTH EVERY DAY AS NEEDED FOR ALLERGY (Patient not taking: Reported on 04/01/2023)   cloNIDine (CATAPRES) 0.1 MG tablet TAKE 1 TABLET BY MOUTH 2 TIMES DAILY.   gabapentin (NEURONTIN) 300 MG capsule Take 1 capsule (300 mg total) by mouth 2 (two) times daily. (Patient not taking: Reported on 04/01/2023)   DULoxetine (CYMBALTA) 30 MG capsule Take 1 capsule (30 mg total) by mouth daily. (Patient not taking: Reported on 04/01/2023)   losartan-hydrochlorothiazide (HYZAAR) 100-25 MG tablet Take 1 tablet by mouth daily. (Patient not taking: Reported on 04/01/2023)   albuterol (VENTOLIN HFA) 108 (90 Base) MCG/ACT inhaler TAKE 2 PUFFS BY MOUTH EVERY 6 HOURS AS NEEDED FOR WHEEZE OR SHORTNESS OF BREATH (Patient not taking: Reported on 04/01/2023)   tiZANidine (ZANAFLEX) 4 MG tablet Take 1 tablet (4 mg total) by mouth every 6 (six) hours as needed for muscle spasms. (Patient not taking: Reported on 04/01/2023)   nicotine (NICODERM CQ - DOSED IN MG/24 HOURS) 21 mg/24hr patch Place 1 patch (21 mg total) onto the skin daily. (Patient not taking: Reported on 04/01/2023)   benzonatate (TESSALON) 100 MG capsule Take 1 capsule (100 mg total) by mouth 3 (three) times daily as needed for cough.   oxyCODONE (ROXICODONE) 15 MG immediate release tablet Take 1 tablet (15 mg total) by mouth every 8 (eight) hours as needed for pain.    Long Term Goals: Improvement in symptoms so as ready for discharge  Short Term Goals: Patient will verbalize feelings in meetings with treatment team members., Patient will attend at least of 50% of the groups daily., Pt will complete the PHQ9 on admission, day 3 and discharge., Patient will participate in completing the [place] Suicide Severity Rating Scale, Patient will score a low risk of violence for 24 hours prior to discharge, and Patient  will take medications as prescribed daily.  Medical Decision Making  Inpatient FBC   Lab Orders         SARS Coronavirus 2 by RT PCR (hospital order, performed in [facility] hospital lab) *cepheid single result test* Anterior Nasal Swab         CBC with Differential/Platelet         Comprehensive metabolic panel         Ethanol         TSH         POCT Urine Drug Screen - (I-Screen)      Meds ordered this encounter  Medications   acetaminophen (TYLENOL) tablet 650 mg   alum & mag hydroxide-simeth (MAALOX/MYLANTA) 200-200-20 MG/5ML suspension 30 mL   magnesium hydroxide (MILK OF MAGNESIA) suspension 30 mL   dicyclomine (BENTYL) tablet 20 mg   hydrOXYzine (ATARAX) tablet 25 mg   loperamide (IMODIUM) capsule 2-4 mg   methocarbamol (ROBAXIN) tablet 500 mg   naproxen (NAPROSYN) tablet 500 mg   ondansetron (ZOFRAN-ODT) disintegrating tablet 4 mg   OLANZapine zydis (ZYPREXA) disintegrating tablet 5 mg   OLANZapine (ZYPREXA) injection 5 mg   OLANZapine (ZYPREXA) injection 10 mg   FOLLOWED BY Linked Order Group    cloNIDine (CATAPRES) tablet 0.1 mg    cloNIDine (CATAPRES) tablet 0.1 mg    cloNIDine (CATAPRES) tablet 0.1 mg     Recommendations  Based on my evaluation the patient does not appear to have an emergency medical condition.  Sindy Guadeloupe, NP 05/01/23  5:02 AM

## 2023-04-30 NOTE — ED Notes (Signed)
 Pt in the bed sleeping. NAD. Respirations are even and unlabored. Will continue to monitor for safety.

## 2023-05-01 ENCOUNTER — Encounter (HOSPITAL_COMMUNITY): Payer: Self-pay | Admitting: Psychiatry

## 2023-05-01 DIAGNOSIS — F419 Anxiety disorder, unspecified: Secondary | ICD-10-CM | POA: Diagnosis not present

## 2023-05-01 DIAGNOSIS — F1410 Cocaine abuse, uncomplicated: Secondary | ICD-10-CM | POA: Diagnosis not present

## 2023-05-01 DIAGNOSIS — Z5900 Homelessness unspecified: Secondary | ICD-10-CM | POA: Diagnosis not present

## 2023-05-01 DIAGNOSIS — F339 Major depressive disorder, recurrent, unspecified: Secondary | ICD-10-CM | POA: Diagnosis not present

## 2023-05-01 LAB — POCT URINE DRUG SCREEN - MANUAL ENTRY (I-SCREEN)
POC Amphetamine UR: NOT DETECTED
POC Buprenorphine (BUP): NOT DETECTED
POC Cocaine UR: POSITIVE — AB
POC Marijuana UR: NOT DETECTED
POC Methadone UR: NOT DETECTED
POC Methamphetamine UR: NOT DETECTED
POC Morphine: NOT DETECTED
POC Oxazepam (BZO): NOT DETECTED
POC Oxycodone UR: NOT DETECTED
POC Secobarbital (BAR): NOT DETECTED

## 2023-05-01 NOTE — Group Note (Signed)
 Group Topic: Communication  Group Date: 04/30/2023 Start Time: 2000 End Time: 2015 Facilitators: Lauro Mayank Teuscher, NT  Department: [facility]  Number of Participants: 5 Group Focus: clarity of thought and communication Treatment Modality:  Cognitive Behavioral Therapy Interventions utilized were clarification Purpose: express feelings and improve communication skills  Name: Dean Webb Date of Birth: 09-18-1975  MR: [id number]    Level of Participation: PT DID NOT ATTEND GROUP Quality of Participation: attentive and cooperative Interactions with others: gave feedback Mood/Affect: appropriate Triggers (if applicable): N/A Cognition: coherent/clear Progress: Gaining insight Response: N/A Plan: patient will be encouraged to attend group sessions.  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

## 2023-05-01 NOTE — ED Notes (Addendum)
 Pt BP 142/92. Pt to receive scheduled Clonodine 0.1mg  per MD orders. Pt denies needs/discomfort or concerns at present. Will continue to monitor for safety.

## 2023-05-01 NOTE — ED Notes (Signed)
 Patient was provided lunch

## 2023-05-01 NOTE — Group Note (Signed)
 Group Topic: Wellness  Group Date: 05/01/2023 Start Time: 1645 End Time: 1715 Facilitators: Cassandria Anger  Department: [facility]  Number of Participants: 6  Group Focus: relaxation Treatment Modality:  Psychoeducation Interventions utilized were patient education Purpose: increase insight  Name: Dean Webb Date of Birth: 13-Nov-1975  MR: [id number]    Level of Participation: active Quality of Participation: attentive, cooperative, and engaged Interactions with others: gave feedback Mood/Affect: appropriate and positive Triggers (if applicable): N/A Cognition: coherent/clear, concrete, and insightful Progress: Gaining insight Response: Patient was asked to meditate and release all negative thoughts and energy from mind. 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

## 2023-05-01 NOTE — ED Notes (Signed)
 Writer alerted provider of flagged BP reading. Pt received scheduled Clonidine 0.1mg  for BP. No further orders given. Tolerated well. Pt asymptomatic. Informed to contact staff with any needs or concerns. Will continue to monitor for safety.

## 2023-05-01 NOTE — ED Notes (Signed)
 Pt sleeping in no acute distress. RR even and unlabored. Environment secured. Will continue to monitor for safety.

## 2023-05-01 NOTE — ED Notes (Signed)
 Patient was provide dinner

## 2023-05-01 NOTE — ED Notes (Signed)
 Patient is in bed calm and sleeping. NAD. Will keep monitoring for safety.

## 2023-05-01 NOTE — Group Note (Signed)
 Group Topic: Relaxation  Group Date: 05/01/2023 Start Time: 1645 End Time: 1715 Facilitators: Prentice Docker, RN  Department: [facility]  Number of Participants: 7  Group Focus: coping skills Treatment Modality:  Skills Training Interventions utilized were group exercise Purpose: enhance coping skills  Name: Dean Webb Date of Birth: 11-Apr-1975  MR: [id number]    Level of Participation: active Quality of Participation: cooperative Interactions with others: engaged Mood/Affect: appropriate Triggers (if applicable): none identified Cognition: coherent/clear Progress: Minimal Response: "I like to meditate. 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

## 2023-05-01 NOTE — ED Provider Notes (Addendum)
 Behavioral Health Progress Note  Date and Time: 05/01/2023 8:46 AM Name: Dean Webb MRN:  [id number]  HPI:  Kerwin Deveny 48 y/o male with a history of polysubstance use, homelessness.  Presented to [facility] voluntarily.  Per the patient he is trying to get some help with his cocaine abuse.  According to the patient he has tried outpatient services before but he relapsed a couple months ago patient also reports he is currently homeless.  Patient is also unemployed.  Denies seeing a therapist or a psychiatrist at this time.   Subjective: Staff reports no behavioral problems overnight.  He slept through the night.  He was admitted to the [facility] at about 8 PM last night requesting help for his crack cocaine abuse and wanting to go to a rehab program.  Patient required no PRNs last night.  His blood pressure is 141/94.  Pulse is 69 and respirations 19.  His SpO2 is 99%. When seen today the patient is lying in bed somewhat withdrawn but cooperative.  He maintained fair eye contact.  His speech is coherent without any obvious looseness of associations flight of ideas or tangentiality.  He denied any psychosis and reports that his depression is fair and that he has no suicidal thoughts.  He is able to contract for safety he states that he has family but is a very complicated relationship with them.  He also has 4 children with whom he has minimal contact.  He is currently unemployed. He denies SI/HI/AVH. His long-term plan is to going to a rehab program.  Diagnosis:  Cocaine use disorder Major depression, recurrent, remission status unspecified.  Final diagnoses:  Cocaine abuse (HCC)  Homelessness unspecified  Anxious appearance  Recurrent major depressive disorder, remission status unspecified (HCC)    Total Time spent with patient: 30 minutes  Past Psychiatric History: Patient denies any prior history of psychiatric admissions or treatments. Past Medical History: Records indicate history of  sickle cell anemia, GERD, asthma, hypertension, OSA and rhabdomyolysis in the past. Family History: Unknown at this time Family Psychiatric  History: Unknown at this time. Social History: Patient reports that he is in a complicated relationship but would not provide information about his living arrangement.  He is homeless.  He lives in [place].  He does have family and has 4 children.  He is currently unemployed.  Additional Social History:    Pain Medications: See MAR Prescriptions: See MAR Over the Counter: See MAR History of alcohol / drug use?: Yes Longest period of sobriety (when/how long): 2-3 weeks Negative Consequences of Use: Financial, Personal relationships Withdrawal Symptoms: None Name of Substance 1: cocaine 1 - Age of First Use: 46 1 - Amount (size/oz): 4 grams 1 - Frequency: daily 1 - Last Use / Amount: within last 24 hours                  Sleep: Fair  Appetite:  Good  Current Medications:  Current Facility-Administered Medications  Medication Dose Route Frequency Provider Last Rate Last Admin   acetaminophen (TYLENOL) tablet 650 mg  650 mg Oral Q6H PRN Sindy Guadeloupe, NP       alum & mag hydroxide-simeth (MAALOX/MYLANTA) 200-200-20 MG/5ML suspension 30 mL  30 mL Oral Q4H PRN Sindy Guadeloupe, NP       cloNIDine (CATAPRES) tablet 0.1 mg  0.1 mg Oral QID Sindy Guadeloupe, NP   0.1 mg at 04/30/23 2216   Followed by   Melene Muller ON 05/02/2023] cloNIDine (CATAPRES) tablet 0.1 mg  0.1 mg Oral Elgie Collard, NP       Followed by   Melene Muller ON 05/05/2023] cloNIDine (CATAPRES) tablet 0.1 mg  0.1 mg Oral QAC breakfast Sindy Guadeloupe, NP       cloNIDine (CATAPRES) tablet 0.3 mg  0.3 mg Oral Once Barbette Merino, NP       dicyclomine (BENTYL) tablet 20 mg  20 mg Oral Q6H PRN Sindy Guadeloupe, NP       hydrOXYzine (ATARAX) tablet 25 mg  25 mg Oral Q6H PRN Sindy Guadeloupe, NP   25 mg at 04/30/23 2206   loperamide (IMODIUM) capsule 2-4 mg  2-4 mg Oral PRN Sindy Guadeloupe, NP        magnesium hydroxide (MILK OF MAGNESIA) suspension 30 mL  30 mL Oral Daily PRN Sindy Guadeloupe, NP       methocarbamol (ROBAXIN) tablet 500 mg  500 mg Oral Q8H PRN Sindy Guadeloupe, NP       naproxen (NAPROSYN) tablet 500 mg  500 mg Oral BID PRN Sindy Guadeloupe, NP       OLANZapine (ZYPREXA) injection 10 mg  10 mg Intramuscular TID PRN Sindy Guadeloupe, NP       OLANZapine (ZYPREXA) injection 5 mg  5 mg Intramuscular TID PRN Sindy Guadeloupe, NP       OLANZapine zydis (ZYPREXA) disintegrating tablet 5 mg  5 mg Oral TID PRN Sindy Guadeloupe, NP       ondansetron (ZOFRAN-ODT) disintegrating tablet 4 mg  4 mg Oral Q6H PRN Sindy Guadeloupe, NP       Current Outpatient Medications  Medication Sig Dispense Refill   albuterol (VENTOLIN HFA) 108 (90 Base) MCG/ACT inhaler TAKE 2 PUFFS BY MOUTH EVERY 6 HOURS AS NEEDED FOR WHEEZE OR SHORTNESS OF BREATH (Patient not taking: Reported on 04/01/2023) 18 each 1   amLODipine (NORVASC) 10 MG tablet Take 1 tablet (10 mg total) by mouth daily. (Patient not taking: Reported on 04/01/2023) 90 tablet 0   benzonatate (TESSALON) 100 MG capsule Take 1 capsule (100 mg total) by mouth 3 (three) times daily as needed for cough. 21 capsule 0   cetirizine (ZYRTEC) 10 MG tablet TAKE 1 TABLET BY MOUTH EVERY DAY AS NEEDED FOR ALLERGY (Patient not taking: Reported on 04/01/2023) 90 tablet 1   cloNIDine (CATAPRES) 0.1 MG tablet TAKE 1 TABLET BY MOUTH 2 TIMES DAILY. 60 tablet 2   DULoxetine (CYMBALTA) 30 MG capsule Take 1 capsule (30 mg total) by mouth daily. (Patient not taking: Reported on 04/01/2023) 90 capsule 1   gabapentin (NEURONTIN) 300 MG capsule Take 1 capsule (300 mg total) by mouth 2 (two) times daily. (Patient not taking: Reported on 04/01/2023) 90 capsule 3   losartan-hydrochlorothiazide (HYZAAR) 100-25 MG tablet Take 1 tablet by mouth daily. (Patient not taking: Reported on 04/01/2023) 90 tablet 0   metoprolol succinate (TOPROL-XL) 50 MG 24 hr tablet Take 1 tablet (50 mg total) by mouth  daily. Take with or immediately following a meal. (Patient not taking: Reported on 04/01/2023) 90 tablet 3   montelukast (SINGULAIR) 10 MG tablet TAKE 1 TABLET BY MOUTH EVERYDAY AT BEDTIME (Patient not taking: Reported on 04/01/2023) 90 tablet 1   nicotine (NICODERM CQ - DOSED IN MG/24 HOURS) 21 mg/24hr patch Place 1 patch (21 mg total) onto the skin daily. (Patient not taking: Reported on 04/01/2023) 28 patch 0   oxyCODONE (ROXICODONE) 15 MG immediate release tablet Take 1 tablet (15 mg total) by mouth every 8 (eight) hours as needed for pain.  8 tablet 0   pantoprazole (PROTONIX) 40 MG tablet Take 1 tablet (40 mg total) by mouth daily. (Patient not taking: Reported on 04/01/2023) 30 tablet 3   tiZANidine (ZANAFLEX) 4 MG tablet Take 1 tablet (4 mg total) by mouth every 6 (six) hours as needed for muscle spasms. (Patient not taking: Reported on 04/01/2023) 30 tablet 0    Labs  Lab Results:  Admission on 04/30/2023  Component Date Value Ref Range Status   WBC 04/30/2023 13.9 (H)  4.0 - 10.5 K/uL Final   RBC 04/30/2023 4.93  4.22 - 5.81 MIL/uL Final   Hemoglobin 04/30/2023 14.9  13.0 - 17.0 g/dL Final   HCT 29/52/8413 38.2 (L)  39.0 - 52.0 % Final   MCV 04/30/2023 77.5 (L)  80.0 - 100.0 fL Final   MCH 04/30/2023 30.2  26.0 - 34.0 pg Final   MCHC 04/30/2023 37.5 (H)  30.0 - 36.0 g/dL Final   REPEATED TO VERIFY   RDW 04/30/2023 15.1  11.5 - 15.5 % Final   Platelets 04/30/2023 314  150 - 400 K/uL Final   nRBC 04/30/2023 1.0 (H)  0.0 - 0.2 % Final   Neutrophils Relative % 04/30/2023 54  % Final   Neutro Abs 04/30/2023 7.7  1.7 - 7.7 K/uL Final   Lymphocytes Relative 04/30/2023 26  % Final   Lymphs Abs 04/30/2023 3.6  0.7 - 4.0 K/uL Final   Monocytes Relative 04/30/2023 13  % Final   Monocytes Absolute 04/30/2023 1.8 (H)  0.1 - 1.0 K/uL Final   Eosinophils Relative 04/30/2023 5  % Final   Eosinophils Absolute 04/30/2023 0.7 (H)  0.0 - 0.5 K/uL Final   Basophils Relative 04/30/2023 1  % Final    Basophils Absolute 04/30/2023 0.1  0.0 - 0.1 K/uL Final   Immature Granulocytes 04/30/2023 1  % Final   Abs Immature Granulocytes 04/30/2023 0.07  0.00 - 0.07 K/uL Final   Pappenheimer Bodies 04/30/2023 PRESENT   Final   Polychromasia 04/30/2023 PRESENT   Final   Performed at [facility], [address], [place], [place] [address]   Sodium 04/30/2023 132 (L)  135 - 145 mmol/L Final   Potassium 04/30/2023 4.4  3.5 - 5.1 mmol/L Final   Chloride 04/30/2023 96 (L)  98 - 111 mmol/L Final   CO2 04/30/2023 27  22 - 32 mmol/L Final   Glucose, Bld 04/30/2023 77  70 - 99 mg/dL Final   Glucose reference range applies only to samples taken after fasting for at least 8 hours.   BUN 04/30/2023 8  6 - 20 mg/dL Final   Creatinine, Ser 04/30/2023 1.15  0.61 - 1.24 mg/dL Final   Calcium 02/72/5366 11.0 (H)  8.9 - 10.3 mg/dL Final   Total Protein 44/04/4740 7.2  6.5 - 8.1 g/dL Final   Albumin 59/56/3875 4.2  3.5 - 5.0 g/dL Final   AST 64/33/2951 29  15 - 41 U/L Final   ALT 04/30/2023 23  0 - 44 U/L Final   Alkaline Phosphatase 04/30/2023 99  38 - 126 U/L Final   Total Bilirubin 04/30/2023 3.2 (H)  0.0 - 1.2 mg/dL Final   GFR, Estimated 04/30/2023 >60  >60 mL/min Final   Comment: (NOTE) Calculated using the CKD-EPI Creatinine Equation (2021)    Anion gap 04/30/2023 9  5 - 15 Final   Performed at [facility], [address] [place], [place] [address]   Alcohol, Ethyl (B) 04/30/2023 <10  <10 mg/dL  Final   Comment: (NOTE) Lowest detectable limit for serum alcohol is 10 mg/dL.  For medical purposes only. Performed at [facility], [address] [place], [place] [address]    TSH 04/30/2023 3.540  0.350 - 4.500 uIU/mL Final   Comment: Performed by a 3rd Generation assay with a functional sensitivity of <=0.01 uIU/mL. Performed at [facility], [address] [place], [place] [address]   Admission on 04/01/2023, Discharged on 04/03/2023  Component Date Value Ref Range  Status   Sodium 04/01/2023 134 (L)  135 - 145 mmol/L Final   Potassium 04/01/2023 3.5  3.5 - 5.1 mmol/L Final   Chloride 04/01/2023 102  98 - 111 mmol/L Final   CO2 04/01/2023 19 (L)  22 - 32 mmol/L Final   Glucose, Bld 04/01/2023 158 (H)  70 - 99 mg/dL Final   Glucose reference range applies only to samples taken after fasting for at least 8 hours.   BUN 04/01/2023 9  6 - 20 mg/dL Final   Creatinine, Ser 04/01/2023 1.26 (H)  0.61 - 1.24 mg/dL Final   Calcium 09/81/1914 10.9 (H)  8.9 - 10.3 mg/dL Final   Total Protein 78/29/5621 7.5  6.5 - 8.1 g/dL Final   Albumin 30/86/5784 4.1  3.5 - 5.0 g/dL Final   AST 69/62/9528 71 (H)  15 - 41 U/L Final   ALT 04/01/2023 47 (H)  0 - 44 U/L Final   Alkaline Phosphatase 04/01/2023 83  38 - 126 U/L Final   Total Bilirubin 04/01/2023 1.8 (H)  0.0 - 1.2 mg/dL Final   GFR, Estimated 04/01/2023 >60  >60 mL/min Final   Comment: (NOTE) Calculated using the CKD-EPI Creatinine Equation (2021)    Anion gap 04/01/2023 13  5 - 15 Final   Performed at [facility], [address], [place], [place] [address]   WBC 04/01/2023 8.8  4.0 - 10.5 K/uL Final   RBC 04/01/2023 4.97  4.22 - 5.81 MIL/uL Final   Hemoglobin 04/01/2023 14.8  13.0 - 17.0 g/dL Final   HCT 40/11/2723 40.7  39.0 - 52.0 % Final   MCV 04/01/2023 81.9  80.0 - 100.0 fL Final   MCH 04/01/2023 29.8  26.0 - 34.0 pg Final   MCHC 04/01/2023 36.4 (H)  30.0 - 36.0 g/dL Final   RDW 36/64/4034 15.2  11.5 - 15.5 % Final   Platelets 04/01/2023 301  150 - 400 K/uL Final   nRBC 04/01/2023 1.9 (H)  0.0 - 0.2 % Final   Neutrophils Relative % 04/01/2023 67  % Final   Neutro Abs 04/01/2023 5.9  1.7 - 7.7 K/uL Final   Lymphocytes Relative 04/01/2023 11  % Final   Lymphs Abs 04/01/2023 1.0  0.7 - 4.0 K/uL Final   Monocytes Relative 04/01/2023 20  % Final   Monocytes Absolute 04/01/2023 1.8 (H)  0.1 - 1.0 K/uL Final   Eosinophils Relative 04/01/2023 0  % Final   Eosinophils Absolute 04/01/2023  0.0  0.0 - 0.5 K/uL Final   Basophils Relative 04/01/2023 1  % Final   Basophils Absolute 04/01/2023 0.1  0.0 - 0.1 K/uL Final   Immature Granulocytes 04/01/2023 1  % Final   Abs Immature Granulocytes 04/01/2023 0.05  0.00 - 0.07 K/uL Final   Performed at [facility], [address] [place], [place] [address]   Retic Ct Pct 04/01/2023 3.4 (H)  0.4 - 3.1 % Final   Comment: REPEATED TO VERIFY CONFIRMED BY  MANUAL DILUTION    RBC. 04/01/2023 4.94  4.22 - 5.81 MIL/uL Final   Retic Count, Absolute 04/01/2023 170.0  19.0 - 186.0 K/uL Final   Immature Retic Fract 04/01/2023 13.3  2.3 - 15.9 % Final   Performed at [facility], [address], [place], [place] [address]   SARS Coronavirus 2 by RT PCR 04/01/2023 POSITIVE (A)  NEGATIVE Final   Comment: (NOTE) SARS-CoV-2 target nucleic acids are DETECTED.  The SARS-CoV-2 RNA is generally detectable in upper respiratory specimens during the acute phase of infection. Positive results are indicative of the presence of the identified virus, but do not rule out bacterial infection or co-infection with other pathogens not detected by the test. Clinical correlation with patient history and other diagnostic information is necessary to determine patient infection status. The expected result is Negative.  Fact Sheet for Patients: BloggerCourse.com  Fact Sheet for Healthcare Providers: SeriousBroker.it  This test is not yet approved or cleared by the [place] FDA and  has been authorized for detection and/or diagnosis of SARS-CoV-2 by FDA under an Emergency Use Authorization (EUA).  This EUA will remain in effect (meaning this test can be used) for the duration of  the COVID-19 declaration under Section 564(b)(1) of the A                          ct, 21 U.S.C. section 360bbb-3(b)(1), unless the authorization is terminated or revoked sooner.     Influenza  A by PCR 04/01/2023 NEGATIVE  NEGATIVE Final   Influenza B by PCR 04/01/2023 NEGATIVE  NEGATIVE Final   Comment: (NOTE) The Xpert Xpress SARS-CoV-2/FLU/RSV plus assay is intended as an aid in the diagnosis of influenza from Nasopharyngeal swab specimens and should not be used as a sole basis for treatment. Nasal washings and aspirates are unacceptable for Xpert Xpress SARS-CoV-2/FLU/RSV testing.  Fact Sheet for Patients: BloggerCourse.com  Fact Sheet for Healthcare Providers: SeriousBroker.it  This test is not yet approved or cleared by the [place] FDA and has been authorized for detection and/or diagnosis of SARS-CoV-2 by FDA under an Emergency Use Authorization (EUA). This EUA will remain in effect (meaning this test can be used) for the duration of the COVID-19 declaration under Section 564(b)(1) of the Act, 21 U.S.C. section 360bbb-3(b)(1), unless the authorization is terminated or revoked.     Resp Syncytial Virus by PCR 04/01/2023 NEGATIVE  NEGATIVE Final   Comment: (NOTE) Fact Sheet for Patients: BloggerCourse.com  Fact Sheet for Healthcare Providers: SeriousBroker.it  This test is not yet approved or cleared by the [place] FDA and has been authorized for detection and/or diagnosis of SARS-CoV-2 by FDA under an Emergency Use Authorization (EUA). This EUA will remain in effect (meaning this test can be used) for the duration of the COVID-19 declaration under Section 564(b)(1) of the Act, 21 U.S.C. section 360bbb-3(b)(1), unless the authorization is terminated or revoked.  Performed at [facility], [address], [place], [place] [address]    Troponin I (High Sensitivity) 04/01/2023 12  <18 ng/L Final   Comment: (NOTE) Elevated high sensitivity troponin I (hsTnI) values and significant  changes across serial measurements may suggest ACS  but many other  chronic and acute conditions are known to elevate hsTnI results.  Refer to the "Links" section for chest pain algorithms and additional  guidance. Performed at [facility], [address], [place], [place] [address]    Opiates  04/01/2023 POSITIVE (A)  NONE DETECTED Final   Cocaine 04/01/2023 POSITIVE (A)  NONE DETECTED Final   Benzodiazepines 04/01/2023 NONE DETECTED  NONE DETECTED Final   Amphetamines 04/01/2023 NONE DETECTED  NONE DETECTED Final   Tetrahydrocannabinol 04/01/2023 NONE DETECTED  NONE DETECTED Final   Barbiturates 04/01/2023 NONE DETECTED  NONE DETECTED Final   Comment: (NOTE) DRUG SCREEN FOR MEDICAL PURPOSES ONLY.  IF CONFIRMATION IS NEEDED FOR ANY PURPOSE, NOTIFY LAB WITHIN 5 DAYS.  LOWEST DETECTABLE LIMITS FOR URINE DRUG SCREEN Drug Class                     Cutoff (ng/mL) Amphetamine and metabolites    1000 Barbiturate and metabolites    200 Benzodiazepine                 200 Opiates and metabolites        300 Cocaine and metabolites        300 THC                            50 Performed at [facility], [address], [place], [place] [address]    Sodium 04/02/2023 133 (L)  135 - 145 mmol/L Final   Potassium 04/02/2023 3.6  3.5 - 5.1 mmol/L Final   Chloride 04/02/2023 103  98 - 111 mmol/L Final   CO2 04/02/2023 21 (L)  22 - 32 mmol/L Final   Glucose, Bld 04/02/2023 132 (H)  70 - 99 mg/dL Final   Glucose reference range applies only to samples taken after fasting for at least 8 hours.   BUN 04/02/2023 7  6 - 20 mg/dL Final   Creatinine, Ser 04/02/2023 0.91  0.61 - 1.24 mg/dL Final   Calcium 70/62/3762 10.0  8.9 - 10.3 mg/dL Final   GFR, Estimated 04/02/2023 >60  >60 mL/min Final   Comment: (NOTE) Calculated using the CKD-EPI Creatinine Equation (2021)    Anion gap 04/02/2023 9  5 - 15 Final   Performed at [facility], [address], [place], [place] [address]   WBC 04/02/2023  9.2  4.0 - 10.5 K/uL Final   RBC 04/02/2023 4.71  4.22 - 5.81 MIL/uL Final   Hemoglobin 04/02/2023 14.0  13.0 - 17.0 g/dL Final   HCT 76/16/0737 38.0 (L)  39.0 - 52.0 % Final   MCV 04/02/2023 80.7  80.0 - 100.0 fL Final   MCH 04/02/2023 29.7  26.0 - 34.0 pg Final   MCHC 04/02/2023 36.8 (H)  30.0 - 36.0 g/dL Final   RDW 10/62/6948 14.6  11.5 - 15.5 % Final   Platelets 04/02/2023 247  150 - 400 K/uL Final   nRBC 04/02/2023 4.4 (H)  0.0 - 0.2 % Final   Performed at [facility], [address], [place], [place] [address]   Magnesium 04/02/2023 2.0  1.7 - 2.4 mg/dL Final   Performed at [facility], [address], [place], [place] [address]   Phosphorus 04/02/2023 2.4 (L)  2.5 - 4.6 mg/dL Final   Performed at [facility], [address], [place], [place] [address]   Alcohol, Ethyl (B) 04/02/2023 <10  <10 mg/dL Final   Comment: (NOTE) Lowest detectable limit for serum alcohol is 10 mg/dL.  For medical purposes only. Performed at [facility], [address], [place], [place] [address]    Total Protein 04/02/2023 6.7  6.5 -  8.1 g/dL Final   Albumin 14/78/2956 3.9  3.5 - 5.0 g/dL Final   AST 21/30/8657 213 (H)  15 - 41 U/L Final   ALT 04/02/2023 104 (H)  0 - 44 U/L Final   Alkaline Phosphatase 04/02/2023 86  38 - 126 U/L Final   Total Bilirubin 04/02/2023 2.0 (H)  0.0 - 1.2 mg/dL Final   Bilirubin, Direct 04/02/2023 0.4 (H)  0.0 - 0.2 mg/dL Final   Indirect Bilirubin 04/02/2023 1.6 (H)  0.3 - 0.9 mg/dL Final   Performed at [facility], [address] [place], [place] [address]   HEP B CORE AB 04/02/2023 Negative  Negative Final   Comment: (NOTE) Performed At: [facility] [address] [place], [place] [id number] Jolene Schimke MD GM:[id number]    Hep B S Ab 04/02/2023 NON REACTIVE  NON REACTIVE Final   Comment: (NOTE) Inconsistent with immunity, less than 10  mIU/mL.  Performed at [facility], [address], [place], [place] [address]    Hepatitis B Surface Ag 04/02/2023 NON REACTIVE  NON REACTIVE Final   Performed at [facility], [address] [place], [place] [address]   HCV Ab 04/02/2023 NON REACTIVE  NON REACTIVE Final   Comment: (NOTE) Nonreactive HCV antibody screen is consistent with no HCV infections,  unless recent infection is suspected or other evidence exists to indicate HCV infection.  Performed at [facility], [address], [place], [place] [address]   Office Visit on 11/23/2022  Component Date Value Ref Range Status   Amphetamines, IA 11/23/2022 Negative  Cutoff:50 ng/mL Final   Barbiturates, IA 11/23/2022 Negative  Cutoff:0.1 ug/mL Final   Benzodiazepines, IA 11/23/2022 Negative  Cutoff:20 ng/mL Final   Cocaine & Metabolite, IA 11/23/2022 Negative  Cutoff:25 ng/mL Final   Phencyclidine, IA 11/23/2022 Negative  Cutoff:8 ng/mL Final   THC(Marijuana) Metabolite, IA 11/23/2022 Negative  Cutoff:5 ng/mL Final   Opiates, IA 11/23/2022 Negative  Cutoff:5 ng/mL Final   Oxycodones, IA 11/23/2022 Negative  Cutoff:5 ng/mL Final   Methadone, IA 11/23/2022 Negative  Cutoff:25 ng/mL Final   Propoxyphene, IA 11/23/2022 Negative  Cutoff:50 ng/mL Final   Comment: This test was developed and its performance characteristics determined by [facility].  It has not been cleared or approved by the Food and Drug Administration.   Lab on 11/09/2022  Component Date Value Ref Range Status   Glucose 11/09/2022 86  70 - 99 mg/dL Final   BUN 56/43/3295 13  6 - 24 mg/dL Final   Creatinine, Ser 11/09/2022 1.42 (H)  0.76 - 1.27 mg/dL Final   eGFR 18/84/1660 62  >59 mL/min/1.73 Final   BUN/Creatinine Ratio 11/09/2022 9  9 - 20 Final   Sodium 11/09/2022 135  134 - 144 mmol/L Final   Potassium 11/09/2022 4.3  3.5 - 5.2 mmol/L Final   Chloride 11/09/2022 101  96 - 106 mmol/L Final   CO2 11/09/2022 22  20 - 29 mmol/L Final    Calcium 11/09/2022 10.4 (H)  8.7 - 10.2 mg/dL Final   Total Protein 63/02/6008 6.9  6.0 - 8.5 g/dL Final   Albumin 93/23/5573 4.3  4.1 - 5.1 g/dL Final   Globulin, Total 11/09/2022 2.6  1.5 - 4.5 g/dL Final   Bilirubin Total 11/09/2022 1.2  0.0 - 1.2 mg/dL Final   Alkaline Phosphatase 11/09/2022 93  44 - 121 IU/L Final   AST 11/09/2022 25  0 - 40 IU/L Final   ALT 11/09/2022 21  0 - 44 IU/L Final   Ferritin 11/09/2022 128  30 - 400 ng/mL Final   Vit D, 25-Hydroxy 11/09/2022 23.7 (L)  30.0 - 100.0 ng/mL Final   Comment: Vitamin D deficiency has been defined by the Institute of Medicine and an Endocrine Society practice guideline as a level of serum 25-OH vitamin D less than 20 ng/mL (1,2). The Endocrine Society went on to further define vitamin D insufficiency as a level between 21 and 29 ng/mL (2). 1. IOM (Institute of Medicine). 2010. Dietary reference    intakes for calcium and D. [place]: The    [employer]. 2. Holick MF, Binkley Hackberry, Bischoff-Ferrari HA, et al.    Evaluation, treatment, and prevention of vitamin D    deficiency: an Endocrine Society clinical practice    guideline. JCEM. 2011 Jul; 96(7):1911-30.    WBC 11/09/2022 13.0 (H)  3.4 - 10.8 x10E3/uL Final   RBC 11/09/2022 4.68  4.14 - 5.80 x10E6/uL Final   Hemoglobin 11/09/2022 14.4  13.0 - 17.7 g/dL Final   Hematocrit 16/11/9602 41.0  37.5 - 51.0 % Final   MCV 11/09/2022 88  79 - 97 fL Final   MCH 11/09/2022 30.8  26.6 - 33.0 pg Final   MCHC 11/09/2022 35.1  31.5 - 35.7 g/dL Final   RDW 54/10/8117 15.7 (H)  11.6 - 15.4 % Final   Platelets 11/09/2022 303  150 - 450 x10E3/uL Final   Neutrophils 11/09/2022 52  Not Estab. % Final   Lymphs 11/09/2022 32  Not Estab. % Final   Monocytes 11/09/2022 12  Not Estab. % Final   Eos 11/09/2022 3  Not Estab. % Final   Basos 11/09/2022 1  Not Estab. % Final   Neutrophils Absolute 11/09/2022 6.7  1.4 - 7.0 x10E3/uL Final   Lymphocytes Absolute 11/09/2022 4.2 (H)   0.7 - 3.1 x10E3/uL Final   Monocytes Absolute 11/09/2022 1.6 (H)  0.1 - 0.9 x10E3/uL Final   EOS (ABSOLUTE) 11/09/2022 0.4  0.0 - 0.4 x10E3/uL Final   Basophils Absolute 11/09/2022 0.1  0.0 - 0.2 x10E3/uL Final   Immature Granulocytes 11/09/2022 0  Not Estab. % Final   Immature Grans (Abs) 11/09/2022 0.0  0.0 - 0.1 x10E3/uL Final   NRBC 11/09/2022 1 (H)  0 - 0 % Final   Retic Ct Pct 11/09/2022 3.6 (H)  0.6 - 2.6 % Final   Ethyl Alcohol, Enz 11/09/2022 Negative  Cutoff:0.020 gm/dL Final   Amphetamines, IA 11/09/2022 Negative  Cutoff:50 ng/mL Final   Barbiturates, IA 11/09/2022 Negative  Cutoff:0.1 ug/mL Final   Benzodiazepines, IA 11/09/2022 Negative  Cutoff:20 ng/mL Final   Cocaine & Metabolite, IA 11/09/2022 ++POSITIVE++ (A)  Cutoff:25 ng/mL Final   Phencyclidine, IA 11/09/2022 Negative  Cutoff:8 ng/mL Final   THC(Marijuana) Metabolite, IA 11/09/2022 Negative  Cutoff:5 ng/mL Final   Opiates, IA 11/09/2022 Negative  Cutoff:5 ng/mL Final   Oxycodones, IA 11/09/2022 Negative  Cutoff:5 ng/mL Final   Methadone, IA 11/09/2022 Negative  Cutoff:25 ng/mL Final   Propoxyphene, IA 11/09/2022 Negative  Cutoff:50 ng/mL Final   Comment: This test was developed and its performance characteristics determined by [facility].  It has not been cleared or approved by the Food and Drug Administration.    Cocaine Confirmation 11/09/2022 Positive   Final   Cocaine 11/09/2022 Negative  ng/mL Final   Benzoylecgonine 11/09/2022 22  ng/mL Final   Confirmation threshold: 10 ng/mL    Blood Alcohol level:  Lab Results  Component Value Date   ETH <  10 04/30/2023   ETH <10 04/02/2023    Metabolic Disorder Labs: No results found for: "HGBA1C", "MPG" No results found for: "PROLACTIN" Lab Results  Component Value Date   CHOL  11/06/2008    84        ATP III CLASSIFICATION:  <200     mg/dL   Desirable  301-601  mg/dL   Borderline High  >=093    mg/dL   High          TRIG 44 11/06/2008   HDL 31 (L)  11/06/2008   CHOLHDL 2.7 11/06/2008   VLDL 9 11/06/2008   LDLCALC  11/06/2008    44        Total Cholesterol/HDL:CHD Risk Coronary Heart Disease Risk Table                     Men   Women  1/2 Average Risk   3.4   3.3  Average Risk       5.0   4.4  2 X Average Risk   9.6   7.1  3 X Average Risk  23.4   11.0        Use the calculated Patient Ratio above and the CHD Risk Table to determine the patient's CHD Risk.        ATP III CLASSIFICATION (LDL):  <100     mg/dL   Optimal  235-573  mg/dL   Near or Above                    Optimal  130-159  mg/dL   Borderline  220-254  mg/dL   High  >270     mg/dL   Very High    Therapeutic Lab Levels: No results found for: "LITHIUM" No results found for: "VALPROATE" No results found for: "CBMZ"  Physical Findings   PHQ2-9    Flowsheet Row ED from 04/30/2023 in [facility] Office Visit from 10/25/2022 in [facility] A Dept Of Eligha Bridegroom [facility] Office Visit from 07/17/2022 in [facility] - A Dept Of Eligha Bridegroom [facility] Office Visit from 04/17/2022 in [facility] - A Dept Of Eligha Bridegroom [facility] Office Visit from 01/12/2022 in [facility] - A Dept Of  [facility]  PHQ-2 Total Score 3 0 0 0 0  PHQ-9 Total Score 20 0 -- -- --      Flowsheet Row ED from 04/30/2023 in [facility] ED to Hosp-Admission (Discharged) from 04/01/2023 in [facility] ED to Hosp-Admission (Discharged) from 08/23/2022 in [facility] GENERAL MED/SURG UNIT  C-SSRS RISK CATEGORY No Risk No Risk No Risk        Musculoskeletal  Strength & Muscle Tone: within normal limits Gait & Station: normal Patient leans: N/A  Psychiatric Specialty Exam  Presentation  General Appearance:  Casual  Eye Contact: Fleeting  Speech: Clear and Coherent  Speech  Volume: Normal  Handedness: Right   Mood and Affect  Mood: Anxious  Affect: Congruent   Thought Process  Thought Processes: Coherent  Descriptions of Associations:Intact  Orientation:Full (Time, Place and Person)  Thought Content:Logical  Diagnosis of Schizophrenia or Schizoaffective disorder in past: No    Hallucinations:Hallucinations: None  Ideas of Reference:None  Suicidal Thoughts:Suicidal Thoughts: No  Homicidal Thoughts:Homicidal Thoughts: No   Sensorium  Memory: Immediate Fair  Judgment: Poor  Insight: Fair   Chartered certified accountant: Fair  Attention Span: Fair  Recall: Jennelle Human of Knowledge:No data recorded Language: Good   Psychomotor Activity  Psychomotor Activity: Psychomotor Activity: Normal   Assets  Assets: Desire for Improvement; Housing; Resilience; Vocational/Educational   Sleep  Sleep: Sleep: Fair Number of Hours of Sleep: 6   Nutritional Assessment (For OBS and FBC admissions only) Has the patient had a weight loss or gain of 10 pounds or more in the last 3 months?: No Has the patient had a decrease in food intake/or appetite?: No Does the patient have dental problems?: No Does the patient have eating habits or behaviors that may be indicators of an eating disorder including binging or inducing vomiting?: No Has the patient recently lost weight without trying?: 0 Has the patient been eating poorly because of a decreased appetite?: 0 Malnutrition Screening Tool Score: 0    Physical Exam  Physical Exam Vitals and nursing note reviewed.  Constitutional:      Appearance: Normal appearance.  HENT:     Head: Normocephalic.  Neurological:     General: No focal deficit present.     Mental Status: He is alert and oriented to person, place, and time. Mental status is at baseline.    Review of Systems  Psychiatric/Behavioral:  Positive for depression and substance abuse.   All other systems  reviewed and are negative.  Blood pressure (!) 141/94, pulse 69, temperature 98.6 F (37 C), temperature source Oral, resp. rate 19, SpO2 99%. There is no height or weight on file to calculate BMI.  Treatment Plan Summary: Daily contact with patient to assess and evaluate symptoms and progress in treatment, Medication management, and Plan the patient is admitted to the inpatient program at [facility]. Marland Kitchen  Short-term goals are to verbalize his feelings in meetings with the treatment team and attend at least 50% of groups daily.  He will complete the PHQ-9 on day 3 and at discharge.  He will take medications as prescribed daily if indicated. He will consider a long-term rehab facility and he will be provided information so he can contact them while at the [facility]. Prognosis is fair to guarded.  Rex Kras, MD 05/01/2023 8:46 AM

## 2023-05-01 NOTE — ED Notes (Signed)
 Pt sitting in dayroom watching television. No acute distress noted. No concerns voiced. Informed pt to notify staff with any needs or assistance. Pt verbalized understanding and agreement. Will continue to monitor for safety.

## 2023-05-01 NOTE — ED Notes (Signed)
 Pt is in his room resting in bed. Pt denies SI/HI/AVH. Pt has no further complains at this moment. No acute distress noted. Will continue to monitor for safety.

## 2023-05-01 NOTE — ED Notes (Signed)
 Patient A&Ox4. Denies intent to harm self/others when asked. Denies A/VH. Pt states, "I feel pretty good today". Denies withdrawal sx from cocaine. Patient denies any physical complaints when asked. No acute distress noted. Support and encouragement provided. Routine safety checks conducted according to facility protocol. Encouraged patient to notify staff if thoughts of harm toward self or others arise. Patient verbalize understanding and agreement. Will continue to monitor for safety.

## 2023-05-01 NOTE — BH Assessment (Signed)
 Comprehensive Clinical Assessment (CCA) Note  05/01/2023 Dean Webb [id number]  Disposition: Dean Guadeloupe, NP, recommends admission to [facility].   The patient demonstrates the following risk factors for suicide: Chronic risk factors for suicide include: psychiatric disorder of depression and substance use disorder. Acute risk factors for suicide include: family or marital conflict, unemployment, social withdrawal/isolation, and loss (financial, interpersonal, professional). Protective factors for this patient include: responsibility to others (children, family) and hope for the future. Considering these factors, the overall suicide risk at this point appears to be moderate. Patient is not appropriate for outpatient follow up.  Dean Webb is a 48 year old male presenting as voluntary walk-in to [facility] requesting detox from cocaine. Patient denied SI, HI, psychosis and alcohol usage. Patient reports using 4 grams of cocaine powder daily within the last year. Patient reported worsening depressive symptoms. Patient denied prior suicide attempts and self-harming behaviors. Patient reported poor sleep and normal appetite.   Patient is not receiving any outpatient therapy or substance abuse treatment. Patient denied being on any psych medications. Patient denied prior psych hospitalizations and substance abuse treatment. Patient denied mental health history.   Patient is currently homeless. Patient is unemployed. Patient denied access to guns. Patient was calm and cooperative during assessment. Patient seeking substance abuse treatment.    Chief Complaint:  Addiction Problem  Visit Diagnosis:  Cocaine Abuse Major Depressive Disorder    CCA Screening, Triage and Referral (STR)  Patient Reported Information How did you hear about [place]? Self  What Is the Reason for Your Visit/Call Today? Patient is a 48 year old male that is a voluntary walk in to [facility] requesting assistance with ongoing  cocaine use. Patient denies any SI, HI or AVH. Patient denies the use of any other substances. Patient denies any prior mental health history. Patient denies any prior inpatient admissions associated with mental health or substance abuse. Patient reports he has been doing over 4 grams of cocaine (powder) daily for the last year. Patient states he is currently unemployed and is homeless.  How Long Has This Been Causing You Problems? > than 6 months  What Do You Feel Would Help You the Most Today? Alcohol or Drug Use Treatment   Have You Recently Had Any Thoughts About Hurting Yourself? No  Are You Planning to Commit Suicide/Harm Yourself At This time? No   Flowsheet Row ED from 04/30/2023 in [facility] ED to Hosp-Admission (Discharged) from 04/01/2023 in [facility] ED to Hosp-Admission (Discharged) from 06/27/2022 in [facility]  C-SSRS RISK CATEGORY No Risk No Risk No Risk       Have you Recently Had Thoughts About Hurting Someone [name]? No  Are You Planning to Harm Someone at This Time? No  Explanation: n/a   Have You Used Any Alcohol or Drugs in the Past 24 Hours? Yes  How Long Ago Did You Use Drugs or Alcohol? 3 grams of cocaine  What Did You Use and How Much? 3 grams of cocaine   Do You Currently Have a Therapist/Psychiatrist? No  Name of Therapist/Psychiatrist:  n/a  Have You Been Recently Discharged From Any Office Practice or Programs? No  Explanation of Discharge From Practice/Program: n/a    CCA Screening Triage Referral Assessment Type of Contact: Face-to-Face  Telemedicine Service Delivery:  n/a Is this Initial or Reassessment?  N/a Date Telepsych consult ordered in [facility]:   N/a Time Telepsych consult ordered in [facility]:  N/a Location of Assessment: [facility] Assessment Services  Provider Location: [facility] Assessment Services   Collateral Involvement: none reported   Does  Patient Have a Automotive engineer Guardian? No  Legal Guardian Contact Information: n/a  Copy of Legal Guardianship Form: -- (n/a)  Legal Guardian Notified of Arrival: -- (n/a)  Legal Guardian Notified of Pending Discharge: -- (n/a)  If Minor and Not Living with Parent(s), Who has Custody? n/a  Is CPS involved or ever been involved? Never  Is APS involved or ever been involved? Never   Patient Determined To Be At Risk for Harm To Self or Others Based on Review of Patient Reported Information or Presenting Complaint? No  Method: No Plan  Availability of Means: No access or NA  Intent: Vague intent or NA  Notification Required: No need or identified person  Additional Information for Danger to Others Potential: -- (n/a)  Additional Comments for Danger to Others Potential: n/a  Are There Guns or Other Weapons in Your Home? No  Types of Guns/Weapons: n/a  Are These Weapons Safely Secured?                            -- (n/a)  Who Could Verify You Are Able To Have These Secured: n/a  Do You Have any Outstanding Charges, Pending Court Dates, Parole/Probation? none reported  Contacted To Inform of Risk of Harm To Self or Others: Other: Comment    Does Patient Present under Involuntary Commitment? No    [place] of Residence: [place]   Patient Currently Receiving the Following Services: Not Receiving Services   Determination of Need: Urgent (48 hours)   Options For Referral: [facility]; Facility-Based Crisis; Medication Management; Inpatient Hospitalization; Outpatient Therapy     CCA Biopsychosocial Patient Reported Schizophrenia/Schizoaffective Diagnosis in Past: No   Strengths: Self-awareness   Mental Health Symptoms Depression:  Hopelessness; Sleep (too much or little); Fatigue   Duration of Depressive symptoms: Duration of Depressive Symptoms: Greater than two weeks   Mania:  None   Anxiety:   Worrying; Tension; Sleep   Psychosis:   None   Duration of Psychotic symptoms:    Trauma:  None   Obsessions:  None   Compulsions:  None   Inattention:  None   Hyperactivity/Impulsivity:  None   Oppositional/Defiant Behaviors:  None   Emotional Irregularity:  None   Other Mood/Personality Symptoms:  n/a    Mental Status Exam Appearance and self-care  Stature:  Average   Weight:  Average weight   Clothing:  Age-appropriate   Grooming:  Normal   Cosmetic use:  None   Posture/gait:  Normal   Motor activity:  Not Remarkable   Sensorium  Attention:  Normal   Concentration:  Normal   Orientation:  X5   Recall/memory:  Normal   Affect and Mood  Affect:  Depressed; Appropriate   Mood:  Depressed; Hopeless   Relating  Eye contact:  Normal   Facial expression:  Depressed   Attitude toward examiner:  Cooperative   Thought and Language  Speech flow: Normal   Thought content:  Appropriate to Mood and Circumstances   Preoccupation:  None   Hallucinations:  None   Organization:  Coherent   [employer] of Knowledge:  Average   Intelligence:  Average   Abstraction:  Normal   Judgement:  Normal   Reality Testing:  Adequate   Insight:  Fair   Decision  Making:  Normal   Social Functioning  Social Maturity:  Impulsive   Social Judgement:  "Chief of Staff"   Stress  Stressors:  Family conflict; Housing; Surveyor, quantity; Relationship   Coping Ability:  Overwhelmed; Exhausted   Skill Deficits:  Self-control; Decision making   Supports:  Family; Support needed     Religion:    Leisure/Recreation:    Exercise/Diet:     CCA Employment/Education Employment/Work Situation:    Education:     CCA Family/Childhood History Family and Relationship History:    Childhood History:          CCA Substance Use Alcohol/Drug Use:                           ASAM's:  Six Dimensions of Multidimensional Assessment  Dimension 1:  Acute Intoxication  and/or Withdrawal Potential:      Dimension 2:  Biomedical Conditions and Complications:      Dimension 3:  Emotional, Behavioral, or Cognitive Conditions and Complications:     Dimension 4:  Readiness to Change:     Dimension 5:  Relapse, Continued use, or Continued Problem Potential:     Dimension 6:  Recovery/Living Environment:     ASAM Severity Score:    ASAM Recommended Level of Treatment:     Substance use Disorder (SUD)    Recommendations for Services/Supports/Treatments:    Disposition Recommendation per psychiatric provider:  Recommends [facility] admission   DSM5 Diagnoses: Patient Active Problem List   Diagnosis Date Noted   Substance abuse (HCC) 04/30/2023   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

## 2023-05-02 DIAGNOSIS — F339 Major depressive disorder, recurrent, unspecified: Secondary | ICD-10-CM | POA: Diagnosis not present

## 2023-05-02 DIAGNOSIS — F1410 Cocaine abuse, uncomplicated: Secondary | ICD-10-CM | POA: Diagnosis not present

## 2023-05-02 DIAGNOSIS — F419 Anxiety disorder, unspecified: Secondary | ICD-10-CM | POA: Diagnosis not present

## 2023-05-02 DIAGNOSIS — Z5900 Homelessness unspecified: Secondary | ICD-10-CM | POA: Diagnosis not present

## 2023-05-02 NOTE — Group Note (Signed)
 Group Topic: Social Support  Group Date: 05/02/2023 Start Time: 1400 End Time: 1500 Facilitators: Janyth Riera, Jacklynn Barnacle, RN  Department: [facility]  Number of Participants: 7  Group Focus: community group Treatment Modality:  Interpersonal Therapy Interventions utilized were support Purpose: express feelings  Name: Dean Webb Date of Birth: [birth year]  MR: [id number]    Level of Participation: active Quality of Participation: attentive and cooperative Interactions with others: gave feedback Mood/Affect: appropriate Triggers (if applicable): None identified Cognition: coherent/clear Progress: Gaining insight Response: Patient actively attended group Plan: patient will be encouraged to continue to attend groups/programs  Patients Problems:  Patient Active Problem List   Diagnosis Date Noted   Substance abuse (HCC) 04/30/2023   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

## 2023-05-02 NOTE — ED Notes (Addendum)
 Abnormal BP of 117/94 obtained. Scheduled clonidine administered with no complications Patient asymptomatic, in no acute distress. Provider Starleen Blue, NP and Rulon Eisenmenger, MD made aware. No further orders at this time.

## 2023-05-02 NOTE — ED Notes (Signed)
 Patient alert & oriented x4. Denies intent to harm self or others when asked. Denies A/VH. Patient denies any physical complaints when asked. No acute distress noted. Scheduled medications administered with no complications. Support and encouragement provided. Routine safety checks conducted per facility protocol. Encouraged patient to notify staff if any thoughts of harm towards self or others arise. Patient verbalizes understanding and agreement.

## 2023-05-02 NOTE — ED Notes (Signed)
 Patient sitting in dayroom interacting with peers. No acute distress noted. No concerns voiced. Informed patient to notify staff with any needs or assistance. Patient verbalized understanding or agreement. Safety checks in place per facility policy.

## 2023-05-02 NOTE — ED Notes (Signed)
 Patient is sleeping. Respirations equal and unlabored, skin warm and dry. No change in assessment or acuity. Routine safety checks conducted according to facility protocol. Will continue to monitor for safety.

## 2023-05-02 NOTE — ED Notes (Signed)
 Abnormal BP of 143/99 obtained. Patient asymptomatic, in no acute distress. Provider Kelli Hope, MD made aware. No further orders at this time.

## 2023-05-02 NOTE — Group Note (Signed)
 Group Topic: Communication  Group Date: 05/01/2023 Start Time: 2000 End Time: 2015 Facilitators: Lauro Orianna Biskup, NT  Department: [facility]  Number of Participants: 9  Group Focus: check in and clarity of thought Treatment Modality:  Cognitive Behavioral Therapy Interventions utilized were support Purpose: express feelings  Name: Dean Webb Date of Birth: May 09, 1975  MR: [id number]    Level of Participation: pt did not attend group Quality of Participation: cooperative Interactions with others: gave feedback Mood/Affect: appropriate Triggers (if applicable): N/A Cognition: coherent/clear Progress: Gaining insight Response: N/A Plan: patient will be encouraged to attend group sessions.  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

## 2023-05-02 NOTE — ED Notes (Signed)
 Pt is in the dayroom watching TV with peers. Pt denies SI/HI/AVH. Pt has no further complain.No acute distress noted. Will continue to monitor for safety and provide support.

## 2023-05-02 NOTE — ED Provider Notes (Signed)
 Behavioral Health Progress Note  Date and Time: 05/02/2023 11:05 AM Name: Dean Webb MRN:  [id number]  HPI:  Dean Webb 48 y/o male with a history of polysubstance use, homelessness.  Presented to [facility] voluntarily.  Per the patient he is trying to get some help with his cocaine abuse.  According to the patient he has tried outpatient services before but he relapsed a couple months ago patient also reports he is currently homeless.  Patient is also unemployed.  Denies seeing a therapist or a psychiatrist at this time.   Subjective: Staff reports no agitation or behavioral problems.  Apparently slept well last night and required no PRNs.  He has been attending groups.   When seen today the patient is alert oriented and cooperative and denies any depression, anxiety or agitation.  He maintained good eye contact.  His speech is coherent without any obvious looseness of associations or flight of ideas.  He continues to report improved symptoms.  He continues to endorse some craving for crack cocaine and states that he is willing to go to a rehab facility.  His vital signs with a blood pressure of 119/94, pulse 85, respirations 19 and SpO2 100%.   Diagnosis:  Cocaine use disorder Major depression, recurrent, remission status unspecified.  Final diagnoses:  Cocaine abuse (HCC)  Homelessness unspecified  Anxious appearance  Recurrent major depressive disorder, remission status unspecified (HCC)    Total Time spent with patient: 30 minutes  Past Psychiatric History: Patient denies any prior history of psychiatric admissions or treatments. Past Medical History: Records indicate history of sickle cell anemia, GERD, asthma, hypertension, OSA and rhabdomyolysis in the past. Family History: Unknown at this time Family Psychiatric  History: Unknown at this time. Social History: Patient reports that he is in a complicated relationship but would not provide information about his living  arrangement.  He is homeless.  He lives in [place].  He does have family and has 4 children.  He is currently unemployed.  Additional Social History:    Pain Medications: See MAR Prescriptions: See MAR Over the Counter: See MAR History of alcohol / drug use?: Yes Longest period of sobriety (when/how long): 2-3 weeks Negative Consequences of Use: Financial, Personal relationships Withdrawal Symptoms: None Name of Substance 1: cocaine 1 - Age of First Use: 46 1 - Amount (size/oz): 4 grams 1 - Frequency: daily 1 - Last Use / Amount: within last 24 hours                  Sleep: Fair  Appetite:  Good  Current Medications:  Current Facility-Administered Medications  Medication Dose Route Frequency Provider Last Rate Last Admin   acetaminophen (TYLENOL) tablet 650 mg  650 mg Oral Q6H PRN Sindy Guadeloupe, NP       alum & mag hydroxide-simeth (MAALOX/MYLANTA) 200-200-20 MG/5ML suspension 30 mL  30 mL Oral Q4H PRN Sindy Guadeloupe, NP       cloNIDine (CATAPRES) tablet 0.1 mg  0.1 mg Oral QID Sindy Guadeloupe, NP   0.1 mg at 05/02/23 1053   Followed by   cloNIDine (CATAPRES) tablet 0.1 mg  0.1 mg Oral Elgie Collard, NP       Followed by   Melene Muller ON 05/05/2023] cloNIDine (CATAPRES) tablet 0.1 mg  0.1 mg Oral QAC breakfast Sindy Guadeloupe, NP       dicyclomine (BENTYL) tablet 20 mg  20 mg Oral Q6H PRN Sindy Guadeloupe, NP       hydrOXYzine (ATARAX) tablet  25 mg  25 mg Oral Q6H PRN Sindy Guadeloupe, NP   25 mg at 04/30/23 2206   loperamide (IMODIUM) capsule 2-4 mg  2-4 mg Oral PRN Sindy Guadeloupe, NP       magnesium hydroxide (MILK OF MAGNESIA) suspension 30 mL  30 mL Oral Daily PRN Sindy Guadeloupe, NP       methocarbamol (ROBAXIN) tablet 500 mg  500 mg Oral Q8H PRN Sindy Guadeloupe, NP       naproxen (NAPROSYN) tablet 500 mg  500 mg Oral BID PRN Sindy Guadeloupe, NP       OLANZapine (ZYPREXA) injection 10 mg  10 mg Intramuscular TID PRN Sindy Guadeloupe, NP       OLANZapine (ZYPREXA) injection 5 mg  5  mg Intramuscular TID PRN Sindy Guadeloupe, NP       OLANZapine zydis (ZYPREXA) disintegrating tablet 5 mg  5 mg Oral TID PRN Sindy Guadeloupe, NP       ondansetron (ZOFRAN-ODT) disintegrating tablet 4 mg  4 mg Oral Q6H PRN Sindy Guadeloupe, NP       Current Outpatient Medications  Medication Sig Dispense Refill   albuterol (VENTOLIN HFA) 108 (90 Base) MCG/ACT inhaler TAKE 2 PUFFS BY MOUTH EVERY 6 HOURS AS NEEDED FOR WHEEZE OR SHORTNESS OF BREATH 18 each 1   amLODipine (NORVASC) 10 MG tablet Take 1 tablet (10 mg total) by mouth daily. 90 tablet 0   cetirizine (ZYRTEC) 10 MG tablet TAKE 1 TABLET BY MOUTH EVERY DAY AS NEEDED FOR ALLERGY 90 tablet 1   cloNIDine (CATAPRES) 0.1 MG tablet TAKE 1 TABLET BY MOUTH 2 TIMES DAILY. (Patient taking differently: Take 0.3 mg by mouth daily.) 60 tablet 2   DULoxetine (CYMBALTA) 30 MG capsule Take 1 capsule (30 mg total) by mouth daily. 90 capsule 1   gabapentin (NEURONTIN) 300 MG capsule Take 1 capsule (300 mg total) by mouth 2 (two) times daily. 90 capsule 3   losartan-hydrochlorothiazide (HYZAAR) 100-25 MG tablet Take 1 tablet by mouth daily. 90 tablet 0   metoprolol succinate (TOPROL-XL) 50 MG 24 hr tablet Take 1 tablet (50 mg total) by mouth daily. Take with or immediately following a meal. 90 tablet 3   montelukast (SINGULAIR) 10 MG tablet TAKE 1 TABLET BY MOUTH EVERYDAY AT BEDTIME 90 tablet 1   nicotine (NICODERM CQ - DOSED IN MG/24 HOURS) 21 mg/24hr patch Place 1 patch (21 mg total) onto the skin daily. 28 patch 0   oxyCODONE (ROXICODONE) 15 MG immediate release tablet Take 1 tablet (15 mg total) by mouth every 8 (eight) hours as needed for pain. 8 tablet 0   pantoprazole (PROTONIX) 40 MG tablet Take 1 tablet (40 mg total) by mouth daily. 30 tablet 3   tiZANidine (ZANAFLEX) 4 MG tablet Take 1 tablet (4 mg total) by mouth every 6 (six) hours as needed for muscle spasms. 30 tablet 0   benzonatate (TESSALON) 100 MG capsule Take 1 capsule (100 mg total) by mouth 3  (three) times daily as needed for cough. (Patient not taking: Reported on 05/01/2023) 21 capsule 0    Labs  Lab Results:  Admission on 04/30/2023  Component Date Value Ref Range Status   WBC 04/30/2023 13.9 (H)  4.0 - 10.5 K/uL Final   RBC 04/30/2023 4.93  4.22 - 5.81 MIL/uL Final   Hemoglobin 04/30/2023 14.9  13.0 - 17.0 g/dL Final   HCT 16/11/9602 38.2 (L)  39.0 - 52.0 % Final   MCV 04/30/2023 77.5 (L)  80.0 -  100.0 fL Final   MCH 04/30/2023 30.2  26.0 - 34.0 pg Final   MCHC 04/30/2023 37.5 (H)  30.0 - 36.0 g/dL Final   REPEATED TO VERIFY   RDW 04/30/2023 15.1  11.5 - 15.5 % Final   Platelets 04/30/2023 314  150 - 400 K/uL Final   nRBC 04/30/2023 1.0 (H)  0.0 - 0.2 % Final   Neutrophils Relative % 04/30/2023 54  % Final   Neutro Abs 04/30/2023 7.7  1.7 - 7.7 K/uL Final   Lymphocytes Relative 04/30/2023 26  % Final   Lymphs Abs 04/30/2023 3.6  0.7 - 4.0 K/uL Final   Monocytes Relative 04/30/2023 13  % Final   Monocytes Absolute 04/30/2023 1.8 (H)  0.1 - 1.0 K/uL Final   Eosinophils Relative 04/30/2023 5  % Final   Eosinophils Absolute 04/30/2023 0.7 (H)  0.0 - 0.5 K/uL Final   Basophils Relative 04/30/2023 1  % Final   Basophils Absolute 04/30/2023 0.1  0.0 - 0.1 K/uL Final   Immature Granulocytes 04/30/2023 1  % Final   Abs Immature Granulocytes 04/30/2023 0.07  0.00 - 0.07 K/uL Final   Pappenheimer Bodies 04/30/2023 PRESENT   Final   Polychromasia 04/30/2023 PRESENT   Final   Performed at [facility], [address] [place], [place] [address]   Sodium 04/30/2023 132 (L)  135 - 145 mmol/L Final   Potassium 04/30/2023 4.4  3.5 - 5.1 mmol/L Final   Chloride 04/30/2023 96 (L)  98 - 111 mmol/L Final   CO2 04/30/2023 27  22 - 32 mmol/L Final   Glucose, Bld 04/30/2023 77  70 - 99 mg/dL Final   Glucose reference range applies only to samples taken after fasting for at least 8 hours.   BUN 04/30/2023 8  6 - 20 mg/dL Final   Creatinine, Ser 04/30/2023 1.15  0.61 - 1.24 mg/dL  Final   Calcium 72/53/6644 11.0 (H)  8.9 - 10.3 mg/dL Final   Total Protein 03/47/4259 7.2  6.5 - 8.1 g/dL Final   Albumin 56/38/7564 4.2  3.5 - 5.0 g/dL Final   AST 33/29/5188 29  15 - 41 U/L Final   ALT 04/30/2023 23  0 - 44 U/L Final   Alkaline Phosphatase 04/30/2023 99  38 - 126 U/L Final   Total Bilirubin 04/30/2023 3.2 (H)  0.0 - 1.2 mg/dL Final   GFR, Estimated 04/30/2023 >60  >60 mL/min Final   Comment: (NOTE) Calculated using the CKD-EPI Creatinine Equation (2021)    Anion gap 04/30/2023 9  5 - 15 Final   Performed at [facility], [address] [place], [place] [address]   Alcohol, Ethyl (B) 04/30/2023 <10  <10 mg/dL Final   Comment: (NOTE) Lowest detectable limit for serum alcohol is 10 mg/dL.  For medical purposes only. Performed at [facility], [address], [place], [place] [address]    TSH 04/30/2023 3.540  0.350 - 4.500 uIU/mL Final   Comment: Performed by a 3rd Generation assay with a functional sensitivity of <=0.01 uIU/mL. Performed at [facility], [address] [place], [place] [address]    POC Amphetamine UR 05/01/2023 None Detected  NONE DETECTED (Cut Off Level 1000 ng/mL) Final   POC Secobarbital (BAR) 05/01/2023 None Detected  NONE DETECTED (Cut Off Level 300 ng/mL) Final   POC Buprenorphine (BUP) 05/01/2023 None Detected  NONE DETECTED (Cut Off Level 10 ng/mL) Final   POC Oxazepam (BZO) 05/01/2023 None Detected  NONE DETECTED (Cut Off Level 300 ng/mL) Final   POC Cocaine UR 05/01/2023 Positive (A)  NONE DETECTED (Cut Off Level 300 ng/mL) Final   POC Methamphetamine UR 05/01/2023 None Detected  NONE DETECTED (Cut Off Level 1000 ng/mL) Final   POC Morphine 05/01/2023 None Detected  NONE DETECTED (Cut Off Level 300 ng/mL) Final   POC Methadone UR 05/01/2023 None Detected  NONE DETECTED (Cut Off Level 300 ng/mL) Final   POC Oxycodone UR 05/01/2023 None Detected  NONE DETECTED (Cut Off Level 100 ng/mL) Final   POC Marijuana UR  05/01/2023 None Detected  NONE DETECTED (Cut Off Level 50 ng/mL) Final  Admission on 04/01/2023, Discharged on 04/03/2023  Component Date Value Ref Range Status   Sodium 04/01/2023 134 (L)  135 - 145 mmol/L Final   Potassium 04/01/2023 3.5  3.5 - 5.1 mmol/L Final   Chloride 04/01/2023 102  98 - 111 mmol/L Final   CO2 04/01/2023 19 (L)  22 - 32 mmol/L Final   Glucose, Bld 04/01/2023 158 (H)  70 - 99 mg/dL Final   Glucose reference range applies only to samples taken after fasting for at least 8 hours.   BUN 04/01/2023 9  6 - 20 mg/dL Final   Creatinine, Ser 04/01/2023 1.26 (H)  0.61 - 1.24 mg/dL Final   Calcium 65/78/4696 10.9 (H)  8.9 - 10.3 mg/dL Final   Total Protein 29/52/8413 7.5  6.5 - 8.1 g/dL Final   Albumin 24/40/1027 4.1  3.5 - 5.0 g/dL Final   AST 25/36/6440 71 (H)  15 - 41 U/L Final   ALT 04/01/2023 47 (H)  0 - 44 U/L Final   Alkaline Phosphatase 04/01/2023 83  38 - 126 U/L Final   Total Bilirubin 04/01/2023 1.8 (H)  0.0 - 1.2 mg/dL Final   GFR, Estimated 04/01/2023 >60  >60 mL/min Final   Comment: (NOTE) Calculated using the CKD-EPI Creatinine Equation (2021)    Anion gap 04/01/2023 13  5 - 15 Final   Performed at [facility], [address], [place], [place] [address]   WBC 04/01/2023 8.8  4.0 - 10.5 K/uL Final   RBC 04/01/2023 4.97  4.22 - 5.81 MIL/uL Final   Hemoglobin 04/01/2023 14.8  13.0 - 17.0 g/dL Final   HCT 59/56/3875 40.7  39.0 - 52.0 % Final   MCV 04/01/2023 81.9  80.0 - 100.0 fL Final   MCH 04/01/2023 29.8  26.0 - 34.0 pg Final   MCHC 04/01/2023 36.4 (H)  30.0 - 36.0 g/dL Final   RDW 64/33/2951 15.2  11.5 - 15.5 % Final   Platelets 04/01/2023 301  150 - 400 K/uL Final   nRBC 04/01/2023 1.9 (H)  0.0 - 0.2 % Final   Neutrophils Relative % 04/01/2023 67  % Final   Neutro Abs 04/01/2023 5.9  1.7 - 7.7 K/uL Final   Lymphocytes Relative 04/01/2023 11  % Final   Lymphs Abs 04/01/2023 1.0  0.7 - 4.0 K/uL Final   Monocytes Relative 04/01/2023  20  % Final   Monocytes Absolute 04/01/2023 1.8 (H)  0.1 - 1.0 K/uL Final   Eosinophils Relative 04/01/2023 0  % Final   Eosinophils Absolute 04/01/2023 0.0  0.0 - 0.5 K/uL Final   Basophils Relative 04/01/2023 1  % Final   Basophils Absolute 04/01/2023 0.1  0.0 - 0.1 K/uL Final   Immature Granulocytes 04/01/2023 1  % Final   Abs Immature Granulocytes 04/01/2023 0.05  0.00 - 0.07 K/uL Final  Performed at [facility], [address], [place], [place] [address]   Retic Ct Pct 04/01/2023 3.4 (H)  0.4 - 3.1 % Final   Comment: REPEATED TO VERIFY CONFIRMED BY MANUAL DILUTION    RBC. 04/01/2023 4.94  4.22 - 5.81 MIL/uL Final   Retic Count, Absolute 04/01/2023 170.0  19.0 - 186.0 K/uL Final   Immature Retic Fract 04/01/2023 13.3  2.3 - 15.9 % Final   Performed at [facility], [address] [place], [place] [address]   SARS Coronavirus 2 by RT PCR 04/01/2023 POSITIVE (A)  NEGATIVE Final   Comment: (NOTE) SARS-CoV-2 target nucleic acids are DETECTED.  The SARS-CoV-2 RNA is generally detectable in upper respiratory specimens during the acute phase of infection. Positive results are indicative of the presence of the identified virus, but do not rule out bacterial infection or co-infection with other pathogens not detected by the test. Clinical correlation with patient history and other diagnostic information is necessary to determine patient infection status. The expected result is Negative.  Fact Sheet for Patients: BloggerCourse.com  Fact Sheet for Healthcare Providers: SeriousBroker.it  This test is not yet approved or cleared by the [place] FDA and  has been authorized for detection and/or diagnosis of SARS-CoV-2 by FDA under an Emergency Use Authorization (EUA).  This EUA will remain in effect (meaning this test can be used) for the duration of  the COVID-19 declaration under Section  564(b)(1) of the A                          ct, 21 U.S.C. section 360bbb-3(b)(1), unless the authorization is terminated or revoked sooner.     Influenza A by PCR 04/01/2023 NEGATIVE  NEGATIVE Final   Influenza B by PCR 04/01/2023 NEGATIVE  NEGATIVE Final   Comment: (NOTE) The Xpert Xpress SARS-CoV-2/FLU/RSV plus assay is intended as an aid in the diagnosis of influenza from Nasopharyngeal swab specimens and should not be used as a sole basis for treatment. Nasal washings and aspirates are unacceptable for Xpert Xpress SARS-CoV-2/FLU/RSV testing.  Fact Sheet for Patients: BloggerCourse.com  Fact Sheet for Healthcare Providers: SeriousBroker.it  This test is not yet approved or cleared by the [place] FDA and has been authorized for detection and/or diagnosis of SARS-CoV-2 by FDA under an Emergency Use Authorization (EUA). This EUA will remain in effect (meaning this test can be used) for the duration of the COVID-19 declaration under Section 564(b)(1) of the Act, 21 U.S.C. section 360bbb-3(b)(1), unless the authorization is terminated or revoked.     Resp Syncytial Virus by PCR 04/01/2023 NEGATIVE  NEGATIVE Final   Comment: (NOTE) Fact Sheet for Patients: BloggerCourse.com  Fact Sheet for Healthcare Providers: SeriousBroker.it  This test is not yet approved or cleared by the [place] FDA and has been authorized for detection and/or diagnosis of SARS-CoV-2 by FDA under an Emergency Use Authorization (EUA). This EUA will remain in effect (meaning this test can be used) for the duration of the COVID-19 declaration under Section 564(b)(1) of the Act, 21 U.S.C. section 360bbb-3(b)(1), unless the authorization is terminated or revoked.  Performed at [facility], [address], [place], [place] [address]    Troponin I (High Sensitivity) 04/01/2023  12  <18 ng/L Final   Comment: (NOTE) Elevated high sensitivity troponin I (hsTnI) values and significant  changes across serial measurements may suggest ACS but many other  chronic and acute conditions are known  to elevate hsTnI results.  Refer to the "Links" section for chest pain algorithms and additional  guidance. Performed at [facility], [address] [place], [place] [address]    Opiates 04/01/2023 POSITIVE (A)  NONE DETECTED Final   Cocaine 04/01/2023 POSITIVE (A)  NONE DETECTED Final   Benzodiazepines 04/01/2023 NONE DETECTED  NONE DETECTED Final   Amphetamines 04/01/2023 NONE DETECTED  NONE DETECTED Final   Tetrahydrocannabinol 04/01/2023 NONE DETECTED  NONE DETECTED Final   Barbiturates 04/01/2023 NONE DETECTED  NONE DETECTED Final   Comment: (NOTE) DRUG SCREEN FOR MEDICAL PURPOSES ONLY.  IF CONFIRMATION IS NEEDED FOR ANY PURPOSE, NOTIFY LAB WITHIN 5 DAYS.  LOWEST DETECTABLE LIMITS FOR URINE DRUG SCREEN Drug Class                     Cutoff (ng/mL) Amphetamine and metabolites    1000 Barbiturate and metabolites    200 Benzodiazepine                 200 Opiates and metabolites        300 Cocaine and metabolites        300 THC                            50 Performed at [facility], [address], [place], [place] [address]    Sodium 04/02/2023 133 (L)  135 - 145 mmol/L Final   Potassium 04/02/2023 3.6  3.5 - 5.1 mmol/L Final   Chloride 04/02/2023 103  98 - 111 mmol/L Final   CO2 04/02/2023 21 (L)  22 - 32 mmol/L Final   Glucose, Bld 04/02/2023 132 (H)  70 - 99 mg/dL Final   Glucose reference range applies only to samples taken after fasting for at least 8 hours.   BUN 04/02/2023 7  6 - 20 mg/dL Final   Creatinine, Ser 04/02/2023 0.91  0.61 - 1.24 mg/dL Final   Calcium 25/36/6440 10.0  8.9 - 10.3 mg/dL Final   GFR, Estimated 04/02/2023 >60  >60 mL/min Final   Comment: (NOTE) Calculated using the CKD-EPI Creatinine  Equation (2021)    Anion gap 04/02/2023 9  5 - 15 Final   Performed at [facility], [address], [place], [place] [address]   WBC 04/02/2023 9.2  4.0 - 10.5 K/uL Final   RBC 04/02/2023 4.71  4.22 - 5.81 MIL/uL Final   Hemoglobin 04/02/2023 14.0  13.0 - 17.0 g/dL Final   HCT 59/56/3875 38.0 (L)  39.0 - 52.0 % Final   MCV 04/02/2023 80.7  80.0 - 100.0 fL Final   MCH 04/02/2023 29.7  26.0 - 34.0 pg Final   MCHC 04/02/2023 36.8 (H)  30.0 - 36.0 g/dL Final   RDW 64/33/2951 14.6  11.5 - 15.5 % Final   Platelets 04/02/2023 247  150 - 400 K/uL Final   nRBC 04/02/2023 4.4 (H)  0.0 - 0.2 % Final   Performed at [facility], [address] [place], [place] [address]   Magnesium 04/02/2023 2.0  1.7 - 2.4 mg/dL Final   Performed at [facility], [address], [place], [place] [address]   Phosphorus 04/02/2023 2.4 (L)  2.5 - 4.6 mg/dL Final   Performed at [facility], [address], [place], [place] [address]   Alcohol, Ethyl (B) 04/02/2023 <10  <10 mg/dL Final   Comment: (NOTE) Lowest  detectable limit for serum alcohol is 10 mg/dL.  For medical purposes only. Performed at [facility], [address] [place], [place] [address]    Total Protein 04/02/2023 6.7  6.5 - 8.1 g/dL Final   Albumin 47/82/9562 3.9  3.5 - 5.0 g/dL Final   AST 13/09/6576 213 (H)  15 - 41 U/L Final   ALT 04/02/2023 104 (H)  0 - 44 U/L Final   Alkaline Phosphatase 04/02/2023 86  38 - 126 U/L Final   Total Bilirubin 04/02/2023 2.0 (H)  0.0 - 1.2 mg/dL Final   Bilirubin, Direct 04/02/2023 0.4 (H)  0.0 - 0.2 mg/dL Final   Indirect Bilirubin 04/02/2023 1.6 (H)  0.3 - 0.9 mg/dL Final   Performed at [facility], [address], [place], [place] [address]   HEP B CORE AB 04/02/2023 Negative  Negative Final   Comment: (NOTE) Performed At: [facility] [address] [place], [place]  [id number] Jolene Schimke MD MW:[id number]    Hep B S Ab 04/02/2023 NON REACTIVE  NON REACTIVE Final   Comment: (NOTE) Inconsistent with immunity, less than 10 mIU/mL.  Performed at [facility], [address], [place], [place] [address]    Hepatitis B Surface Ag 04/02/2023 NON REACTIVE  NON REACTIVE Final   Performed at [facility], [address] [place], [place] [address]   HCV Ab 04/02/2023 NON REACTIVE  NON REACTIVE Final   Comment: (NOTE) Nonreactive HCV antibody screen is consistent with no HCV infections,  unless recent infection is suspected or other evidence exists to indicate HCV infection.  Performed at [facility], [address] [place], [place] [address]   Office Visit on 11/23/2022  Component Date Value Ref Range Status   Amphetamines, IA 11/23/2022 Negative  Cutoff:50 ng/mL Final   Barbiturates, IA 11/23/2022 Negative  Cutoff:0.1 ug/mL Final   Benzodiazepines, IA 11/23/2022 Negative  Cutoff:20 ng/mL Final   Cocaine & Metabolite, IA 11/23/2022 Negative  Cutoff:25 ng/mL Final   Phencyclidine, IA 11/23/2022 Negative  Cutoff:8 ng/mL Final   THC(Marijuana) Metabolite, IA 11/23/2022 Negative  Cutoff:5 ng/mL Final   Opiates, IA 11/23/2022 Negative  Cutoff:5 ng/mL Final   Oxycodones, IA 11/23/2022 Negative  Cutoff:5 ng/mL Final   Methadone, IA 11/23/2022 Negative  Cutoff:25 ng/mL Final   Propoxyphene, IA 11/23/2022 Negative  Cutoff:50 ng/mL Final   Comment: This test was developed and its performance characteristics determined by [facility].  It has not been cleared or approved by the Food and Drug Administration.   Lab on 11/09/2022  Component Date Value Ref Range Status   Glucose 11/09/2022 86  70 - 99 mg/dL Final   BUN 64/33/2951 13  6 - 24 mg/dL Final   Creatinine, Ser 11/09/2022 1.42 (H)  0.76 - 1.27 mg/dL Final   eGFR 88/41/6606 62  >59 mL/min/1.73 Final   BUN/Creatinine Ratio 11/09/2022 9  9 - 20 Final   Sodium 11/09/2022 135  134 - 144  mmol/L Final   Potassium 11/09/2022 4.3  3.5 - 5.2 mmol/L Final   Chloride 11/09/2022 101  96 - 106 mmol/L Final   CO2 11/09/2022 22  20 - 29 mmol/L Final   Calcium 11/09/2022 10.4 (H)  8.7 - 10.2 mg/dL Final   Total Protein 30/16/0109 6.9  6.0 - 8.5 g/dL Final   Albumin 32/35/5732 4.3  4.1 - 5.1 g/dL Final   Globulin, Total 11/09/2022 2.6  1.5 - 4.5 g/dL Final   Bilirubin Total 11/09/2022  1.2  0.0 - 1.2 mg/dL Final   Alkaline Phosphatase 11/09/2022 93  44 - 121 IU/L Final   AST 11/09/2022 25  0 - 40 IU/L Final   ALT 11/09/2022 21  0 - 44 IU/L Final   Ferritin 11/09/2022 128  30 - 400 ng/mL Final   Vit D, 25-Hydroxy 11/09/2022 23.7 (L)  30.0 - 100.0 ng/mL Final   Comment: Vitamin D deficiency has been defined by the Institute of Medicine and an Endocrine Society practice guideline as a level of serum 25-OH vitamin D less than 20 ng/mL (1,2). The Endocrine Society went on to further define vitamin D insufficiency as a level between 21 and 29 ng/mL (2). 1. IOM (Institute of Medicine). 2010. Dietary reference    intakes for calcium and D. [place]: The    [employer]. 2. Holick MF, Binkley [place], Bischoff-Ferrari HA, et al.    Evaluation, treatment, and prevention of vitamin D    deficiency: an Endocrine Society clinical practice    guideline. JCEM. 2011 Jul; 96(7):1911-30.    WBC 11/09/2022 13.0 (H)  3.4 - 10.8 x10E3/uL Final   RBC 11/09/2022 4.68  4.14 - 5.80 x10E6/uL Final   Hemoglobin 11/09/2022 14.4  13.0 - 17.7 g/dL Final   Hematocrit 16/11/9602 41.0  37.5 - 51.0 % Final   MCV 11/09/2022 88  79 - 97 fL Final   MCH 11/09/2022 30.8  26.6 - 33.0 pg Final   MCHC 11/09/2022 35.1  31.5 - 35.7 g/dL Final   RDW 54/10/8117 15.7 (H)  11.6 - 15.4 % Final   Platelets 11/09/2022 303  150 - 450 x10E3/uL Final   Neutrophils 11/09/2022 52  Not Estab. % Final   Lymphs 11/09/2022 32  Not Estab. % Final   Monocytes 11/09/2022 12  Not Estab. % Final   Eos 11/09/2022 3  Not Estab.  % Final   Basos 11/09/2022 1  Not Estab. % Final   Neutrophils Absolute 11/09/2022 6.7  1.4 - 7.0 x10E3/uL Final   Lymphocytes Absolute 11/09/2022 4.2 (H)  0.7 - 3.1 x10E3/uL Final   Monocytes Absolute 11/09/2022 1.6 (H)  0.1 - 0.9 x10E3/uL Final   EOS (ABSOLUTE) 11/09/2022 0.4  0.0 - 0.4 x10E3/uL Final   Basophils Absolute 11/09/2022 0.1  0.0 - 0.2 x10E3/uL Final   Immature Granulocytes 11/09/2022 0  Not Estab. % Final   Immature Grans (Abs) 11/09/2022 0.0  0.0 - 0.1 x10E3/uL Final   NRBC 11/09/2022 1 (H)  0 - 0 % Final   Retic Ct Pct 11/09/2022 3.6 (H)  0.6 - 2.6 % Final   Ethyl Alcohol, Enz 11/09/2022 Negative  Cutoff:0.020 gm/dL Final   Amphetamines, IA 11/09/2022 Negative  Cutoff:50 ng/mL Final   Barbiturates, IA 11/09/2022 Negative  Cutoff:0.1 ug/mL Final   Benzodiazepines, IA 11/09/2022 Negative  Cutoff:20 ng/mL Final   Cocaine & Metabolite, [place] 11/09/2022 ++POSITIVE++ (A)  Cutoff:25 ng/mL Final   Phencyclidine, [place] 11/09/2022 Negative  Cutoff:8 ng/mL Final   THC(Marijuana) Metabolite, IA 11/09/2022 Negative  Cutoff:5 ng/mL Final   Opiates, IA 11/09/2022 Negative  Cutoff:5 ng/mL Final   Oxycodones, [place] 11/09/2022 Negative  Cutoff:5 ng/mL Final   Methadone, [place] 11/09/2022 Negative  Cutoff:25 ng/mL Final   Propoxyphene, [place] 11/09/2022 Negative  Cutoff:50 ng/mL Final   Comment: This test was developed and its performance characteristics determined by [facility].  It has not been cleared or approved by the Food and Drug Administration.    Cocaine Confirmation 11/09/2022 Positive   Final  Cocaine 11/09/2022 Negative  ng/mL Final   Benzoylecgonine 11/09/2022 22  ng/mL Final   Confirmation threshold: 10 ng/mL    Blood Alcohol level:  Lab Results  Component Value Date   ETH <10 04/30/2023   ETH <10 04/02/2023    Metabolic Disorder Labs: No results found for: "HGBA1C", "MPG" No results found for: "PROLACTIN" Lab Results  Component Value Date   CHOL  11/06/2008    84        ATP  III CLASSIFICATION:  <200     mg/dL   Desirable  161-096  mg/dL   Borderline High  >=045    mg/dL   High          TRIG 44 11/06/2008   HDL 31 (L) 11/06/2008   CHOLHDL 2.7 11/06/2008   VLDL 9 11/06/2008   LDLCALC  11/06/2008    44        Total Cholesterol/HDL:CHD Risk Coronary Heart Disease Risk Table                     Men   Women  1/2 Average Risk   3.4   3.3  Average Risk       5.0   4.4  2 X Average Risk   9.6   7.1  3 X Average Risk  23.4   11.0        Use the calculated Patient Ratio above and the CHD Risk Table to determine the patient's CHD Risk.        ATP III CLASSIFICATION (LDL):  <100     mg/dL   Optimal  409-811  mg/dL   Near or Above                    Optimal  130-159  mg/dL   Borderline  914-782  mg/dL   High  >956     mg/dL   Very High    Therapeutic Lab Levels: No results found for: "LITHIUM" No results found for: "VALPROATE" No results found for: "CBMZ"  Physical Findings   PHQ2-9    Flowsheet Row ED from 04/30/2023 in [facility] Office Visit from 10/25/2022 in [facility] A Dept Of Eligha Bridegroom [facility] Office Visit from 07/17/2022 in [facility] - [facility] Dept Of [facility] Office Visit from 04/17/2022 in [facility] - A Dept Of Eligha Bride[facility] Office Visit from 01/12/2022 in [facility] Of [facility]  PHQ-2 Total Score 3 0 0 0 0  PHQ-9 Total Score 20 0 -- -- --      Flowsheet Row ED from 04/30/2023 in [facility] ED to Hosp-Admission (Discharged) from 04/01/2023 in [facility] to Hosp-Admission (Discharged) from 08/23/2022 in [facility]SURG UNIT  C-SSRS RISK CATEGORY No Risk No Risk No Risk        Musculoskeletal  Strength & Muscle Tone: within normal limits Gait & Station: normal Patient  leans: N/A  Psychiatric Specialty Exam  Presentation  General Appearance:  Casual  Eye Contact: Fleeting  Speech: Clear and Coherent  Speech Volume: Normal  Handedness: Right   Mood and Affect  Mood: Anxious  Affect: Congruent   Thought Process  Thought Processes: Coherent  Descriptions of Associations:Intact  Orientation:Full (Time, Place and Person)  Thought Content:Logical  Diagnosis of Schizophrenia or Schizoaffective disorder in past: No    Hallucinations:No data recorded  Ideas of Reference:None  Suicidal Thoughts:No data recorded  Homicidal Thoughts:No data recorded   Sensorium  Memory: Immediate Fair  Judgment: Poor  Insight: Fair   Chartered certified accountant: Fair  Attention Span: Fair  Recall: [employer] of Knowledge:No data recorded Language: Good   Psychomotor Activity  Psychomotor Activity: No data recorded   Assets  Assets: Desire for Improvement; Housing; Resilience; Vocational/Educational   Sleep  Sleep: No data recorded   No data recorded   Physical Exam  Physical Exam Vitals and nursing note reviewed.  Constitutional:      Appearance: Normal appearance.  HENT:     Head: Normocephalic.  Neurological:     General: No focal deficit present.     Mental Status: He is alert and oriented to person, place, and time. Mental status is at baseline.  Psychiatric:        Mood and Affect: Mood normal.    Review of Systems  Psychiatric/Behavioral:  Positive for depression and substance abuse.   All other systems reviewed and are negative.  Blood pressure (!) 117/94, pulse 85, temperature 98.4 F (36.9 C), temperature source Oral, resp. rate 19, SpO2 100%. There is no height or weight on file to calculate BMI.  Treatment Plan Summary: Daily contact with patient to assess and evaluate symptoms and progress in treatment, Medication management, and Plan the patient is admitted to the inpatient  program at [facility]. Marland Kitchen  Short-term goals are to verbalize his feelings in meetings with the treatment team and attend at least 50% of groups daily.  He will complete the PHQ-9 on day 3 and at discharge.  He will take medications as prescribed daily if indicated. He will consider a long-term rehab facility and he will be provided information so he can contact them while at the [facility]  He is strongly encouraged to contact the social worker and discuss.  He is also encouraged to contact the facilities offering rehab ASAP. Prognosis is fair to guarded.  Rex Kras, MD 05/02/2023 11:05 AM

## 2023-05-02 NOTE — Group Note (Signed)
 Group Topic: Social Support  Group Date: 05/02/2023 Start Time: 2000 End Time: 2030 Facilitators: Rae Lips B  Department: [facility]  Number of Participants: 5  Group Focus: activities of daily living skills, anxiety, check in, daily focus, and social skills Treatment Modality:  Individual Therapy Interventions utilized were patient education and support Purpose: express feelings, increase insight, and regain self-worth  Name: Dean Webb Date of Birth: 01-Sep-1975  MR: [id number]    Level of Participation: active Quality of Participation: cooperative Interactions with others: gave feedback Mood/Affect: appropriate Triggers (if applicable): NA Cognition: coherent/clear Progress: Gaining insight Response: NA Plan: patient will be encouraged to keep going to groups.   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

## 2023-05-02 NOTE — ED Notes (Signed)
 Pt BP is 139/97. NP [place] notified.

## 2023-05-02 NOTE — Group Note (Signed)
 Group Topic: Change and Accountability  Group Date: 05/02/2023 Start Time: 0945 End Time: 1012 Facilitators: Vonzell Schlatter B  Department: [facility]  Number of Participants: 6  Group Focus: daily focus and feeling awareness/expression Treatment Modality:  Psychoeducation Interventions utilized were patient education and support Purpose: increase insight and reinforce self-care  Name: Dean Webb Date of Birth: 10-15-1975  MR: [id number]    Level of Participation: active Quality of Participation: attentive and cooperative Interactions with others: gave feedback Mood/Affect: positive Triggers (if applicable): N/A Cognition: coherent/clear Progress: Moderate Response: patient is looking to go inpatient somewhere and live a better life without drugs Plan: follow-up needed  Patients Problems:  Patient Active Problem List   Diagnosis Date Noted   Substance abuse (HCC) 04/30/2023   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

## 2023-05-03 DIAGNOSIS — F419 Anxiety disorder, unspecified: Secondary | ICD-10-CM | POA: Diagnosis not present

## 2023-05-03 DIAGNOSIS — Z5900 Homelessness unspecified: Secondary | ICD-10-CM | POA: Diagnosis not present

## 2023-05-03 DIAGNOSIS — F1410 Cocaine abuse, uncomplicated: Secondary | ICD-10-CM | POA: Diagnosis not present

## 2023-05-03 DIAGNOSIS — F339 Major depressive disorder, recurrent, unspecified: Secondary | ICD-10-CM | POA: Diagnosis not present

## 2023-05-03 NOTE — Group Note (Signed)
 Group Topic: Balance in Life  Group Date: 05/03/2023 Start Time: 1230 End Time: 1300 Facilitators: Concha Norway, NT  Department: [facility]  Number of Participants: 6  Group Focus: acceptance Treatment Modality:  Solution-Focused Therapy Interventions utilized were story telling Purpose: enhance coping skills  Name: Dean Webb Date of Birth: [birth year]  MR: [id number]    Level of Participation: minimal Quality of Participation: attentive Interactions with others:   Mood/Affect: appropriate Triggers (if applicable):   Cognition: concrete Progress: Moderate Response:   Plan: follow-up needed  Patients Problems:  Patient Active Problem List   Diagnosis Date Noted   Substance abuse (HCC) 04/30/2023   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

## 2023-05-03 NOTE — ED Notes (Signed)
 Patient sitting in dayroom interacting with peers. No acute distress noted. No concerns voiced. Informed patient to notify staff with any needs or assistance. Patient verbalized understanding or agreement. Safety checks in place per facility policy.

## 2023-05-03 NOTE — ED Notes (Signed)
 Pt BP is 138/95.NP [place] notified.

## 2023-05-03 NOTE — ED Notes (Signed)
 Abnormal BP of 144/99 obtained. Patient asymptomatic, in no acute distress. Provider Enedina Finner, MD and Magnus Ivan, NP made aware. No orders at this time.

## 2023-05-03 NOTE — Group Note (Signed)
 Group Topic: Wellness  Group Date: 05/03/2023 Start Time: 1200 End Time: 1220 Facilitators: Dnaiel Voller, Jacklynn Barnacle, RN  Department: [facility]  Number of Participants: 7  Group Focus: nutrition education Treatment Modality:  Interpersonal Therapy Interventions utilized were exploration and patient education Purpose: increase insight  Name: Dean Webb Date of Birth: 1975-09-07  MR: [id number]    Level of Participation: active Quality of Participation: attentive and cooperative Interactions with others: gave feedback Mood/Affect: appropriate Triggers (if applicable): None identified Cognition: coherent/clear Progress: Gaining insight Response: Patient attended group and was cooperative throughout.  Plan: patient will be encouraged to continue to attend groups/programing.  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

## 2023-05-03 NOTE — Discharge Planning (Signed)
 LCSW went and spoke with patient on today regarding disposition plans.  Patient reports he was informed by MD that he has to discharge on tomorrow.  LCSW explored the patient's plans at discharge, and patient reports he wants to go to a residential program for 28 to 30 days.  Patient made aware that LCSW will send referral to [facility] in [facility] and [facility] in [place].  LCSW will follow up with the patient to provide updates. Patient denies any legal charges upcoming court dates. No other needs to report at this time.   Referral has been sent to [facility] and [facility] for review. Patient reports if he has to leave on tomorrow then he will call his brother to come and get him. Updates to be provided as received.   Fernande Boyden, LCSW Clinical Social Worker [facility] Ph: [phone]

## 2023-05-03 NOTE — ED Notes (Signed)
 Pt in the dayroom calm and composed watching TV with other patients. NAD, respirations are even and unlabored.  Will continue to monitor for safety.

## 2023-05-03 NOTE — ED Notes (Signed)
 Patient is sleeping. Respirations equal and unlabored, skin warm and dry. No change in assessment or acuity. Routine safety checks conducted according to facility protocol. Will continue to monitor for safety.

## 2023-05-03 NOTE — ED Notes (Signed)
 Patient alert & oriented x4. Denies intent to harm self or others when asked. Denies A/VH. Patient denies any physical complaints when asked. Scheduled medications administered with no complications. No acute distress noted. Support and encouragement provided. Routine safety checks conducted per facility protocol. Encouraged patient to notify staff if any thoughts of harm towards self or others arise. Patient verbalizes understanding and agreement.

## 2023-05-03 NOTE — ED Notes (Signed)
 BP

## 2023-05-03 NOTE — ED Provider Notes (Signed)
 Behavioral Health Progress Note  Date and Time: 05/03/2023 8:45 AM Name: Dean Webb MRN:  [id number]  HPI:  Eugune Corkins 48 y/o male with a history of polysubstance use, homelessness.  Presented to [facility] voluntarily.  Per the patient he is trying to get some help with his cocaine abuse.  According to the patient he has tried outpatient services before but he relapsed a couple months ago patient also reports he is currently homeless.  Patient is also unemployed.  Denies seeing a therapist or a psychiatrist at this time.   Subjective: Staff reports no concerns last night.  Patient slept fairly well although he had some difficulty falling asleep.  He required no as needed medications.  He has been attending groups.  No signs or symptoms of withdrawals are noted.  When seen today the patient is alert oriented and cooperative and pleasant.  He maintained fair eye contact.  He was somewhat concerned about cravings and reports that he would like to go to rehab if possible.  Alternatively he can contact his brother who will pick him up but he is concerned about the risk of relapse.  Discussed the case with the social worker and we will offer him resources for possible long-term rehab. Vital signs 120/82, 73, 16, 100%.  PHQ-9 =2 Diagnosis:  Cocaine use disorder Major depression, recurrent, remission status unspecified.  Final diagnoses:  Cocaine abuse (HCC)  Homelessness unspecified  Anxious appearance  Recurrent major depressive disorder, remission status unspecified (HCC)    Total Time spent with patient: 30 minutes  Past Psychiatric History: Patient denies any prior history of psychiatric admissions or treatments. Past Medical History: Records indicate history of sickle cell anemia, GERD, asthma, hypertension, OSA and rhabdomyolysis in the past. Family History: Unknown at this time Family Psychiatric  History: Unknown at this time. Social History: Patient reports that he is in a  complicated relationship but would not provide information about his living arrangement.  He is homeless.  He lives in [place].  He does have family and has 4 children.  He is currently unemployed.  Additional Social History:    Pain Medications: See MAR Prescriptions: See MAR Over the Counter: See MAR History of alcohol / drug use?: Yes Longest period of sobriety (when/how long): 2-3 weeks Negative Consequences of Use: Financial, Personal relationships Withdrawal Symptoms: None Name of Substance 1: cocaine 1 - Age of First Use: 46 1 - Amount (size/oz): 4 grams 1 - Frequency: daily 1 - Last Use / Amount: within last 24 hours                  Sleep: Fair  Appetite:  Good  Current Medications:  Current Facility-Administered Medications  Medication Dose Route Frequency Provider Last Rate Last Admin   acetaminophen (TYLENOL) tablet 650 mg  650 mg Oral Q6H PRN Sindy Guadeloupe, NP       alum & mag hydroxide-simeth (MAALOX/MYLANTA) 200-200-20 MG/5ML suspension 30 mL  30 mL Oral Q4H PRN Sindy Guadeloupe, NP       cloNIDine (CATAPRES) tablet 0.1 mg  0.1 mg Oral Elgie Collard, NP   0.1 mg at 05/03/23 2130   Followed by   Melene Muller ON 05/05/2023] cloNIDine (CATAPRES) tablet 0.1 mg  0.1 mg Oral QAC breakfast Sindy Guadeloupe, NP       dicyclomine (BENTYL) tablet 20 mg  20 mg Oral Q6H PRN Sindy Guadeloupe, NP       hydrOXYzine (ATARAX) tablet 25 mg  25 mg Oral Q6H PRN  Sindy Guadeloupe, NP   25 mg at 04/30/23 2206   loperamide (IMODIUM) capsule 2-4 mg  2-4 mg Oral PRN Sindy Guadeloupe, NP       magnesium hydroxide (MILK OF MAGNESIA) suspension 30 mL  30 mL Oral Daily PRN Sindy Guadeloupe, NP       methocarbamol (ROBAXIN) tablet 500 mg  500 mg Oral Q8H PRN Sindy Guadeloupe, NP       naproxen (NAPROSYN) tablet 500 mg  500 mg Oral BID PRN Sindy Guadeloupe, NP       OLANZapine (ZYPREXA) injection 10 mg  10 mg Intramuscular TID PRN Sindy Guadeloupe, NP       OLANZapine (ZYPREXA) injection 5 mg  5 mg  Intramuscular TID PRN Sindy Guadeloupe, NP       OLANZapine zydis (ZYPREXA) disintegrating tablet 5 mg  5 mg Oral TID PRN Sindy Guadeloupe, NP       ondansetron (ZOFRAN-ODT) disintegrating tablet 4 mg  4 mg Oral Q6H PRN Sindy Guadeloupe, NP       Current Outpatient Medications  Medication Sig Dispense Refill   albuterol (VENTOLIN HFA) 108 (90 Base) MCG/ACT inhaler TAKE 2 PUFFS BY MOUTH EVERY 6 HOURS AS NEEDED FOR WHEEZE OR SHORTNESS OF BREATH 18 each 1   amLODipine (NORVASC) 10 MG tablet Take 1 tablet (10 mg total) by mouth daily. 90 tablet 0   cetirizine (ZYRTEC) 10 MG tablet TAKE 1 TABLET BY MOUTH EVERY DAY AS NEEDED FOR ALLERGY 90 tablet 1   cloNIDine (CATAPRES) 0.1 MG tablet TAKE 1 TABLET BY MOUTH 2 TIMES DAILY. (Patient taking differently: Take 0.3 mg by mouth daily.) 60 tablet 2   DULoxetine (CYMBALTA) 30 MG capsule Take 1 capsule (30 mg total) by mouth daily. 90 capsule 1   gabapentin (NEURONTIN) 300 MG capsule Take 1 capsule (300 mg total) by mouth 2 (two) times daily. 90 capsule 3   losartan-hydrochlorothiazide (HYZAAR) 100-25 MG tablet Take 1 tablet by mouth daily. 90 tablet 0   metoprolol succinate (TOPROL-XL) 50 MG 24 hr tablet Take 1 tablet (50 mg total) by mouth daily. Take with or immediately following a meal. 90 tablet 3   montelukast (SINGULAIR) 10 MG tablet TAKE 1 TABLET BY MOUTH EVERYDAY AT BEDTIME 90 tablet 1   nicotine (NICODERM CQ - DOSED IN MG/24 HOURS) 21 mg/24hr patch Place 1 patch (21 mg total) onto the skin daily. 28 patch 0   oxyCODONE (ROXICODONE) 15 MG immediate release tablet Take 1 tablet (15 mg total) by mouth every 8 (eight) hours as needed for pain. 8 tablet 0   pantoprazole (PROTONIX) 40 MG tablet Take 1 tablet (40 mg total) by mouth daily. 30 tablet 3   tiZANidine (ZANAFLEX) 4 MG tablet Take 1 tablet (4 mg total) by mouth every 6 (six) hours as needed for muscle spasms. 30 tablet 0   benzonatate (TESSALON) 100 MG capsule Take 1 capsule (100 mg total) by mouth 3 (three)  times daily as needed for cough. (Patient not taking: Reported on 05/01/2023) 21 capsule 0    Labs  Lab Results:  Admission on 04/30/2023  Component Date Value Ref Range Status   WBC 04/30/2023 13.9 (H)  4.0 - 10.5 K/uL Final   RBC 04/30/2023 4.93  4.22 - 5.81 MIL/uL Final   Hemoglobin 04/30/2023 14.9  13.0 - 17.0 g/dL Final   HCT 09/81/1914 38.2 (L)  39.0 - 52.0 % Final   MCV 04/30/2023 77.5 (L)  80.0 - 100.0 fL Final   MCH 04/30/2023 30.2  26.0 - 34.0 pg Final   MCHC 04/30/2023 37.5 (H)  30.0 - 36.0 g/dL Final   REPEATED TO VERIFY   RDW 04/30/2023 15.1  11.5 - 15.5 % Final   Platelets 04/30/2023 314  150 - 400 K/uL Final   nRBC 04/30/2023 1.0 (H)  0.0 - 0.2 % Final   Neutrophils Relative % 04/30/2023 54  % Final   Neutro Abs 04/30/2023 7.7  1.7 - 7.7 K/uL Final   Lymphocytes Relative 04/30/2023 26  % Final   Lymphs Abs 04/30/2023 3.6  0.7 - 4.0 K/uL Final   Monocytes Relative 04/30/2023 13  % Final   Monocytes Absolute 04/30/2023 1.8 (H)  0.1 - 1.0 K/uL Final   Eosinophils Relative 04/30/2023 5  % Final   Eosinophils Absolute 04/30/2023 0.7 (H)  0.0 - 0.5 K/uL Final   Basophils Relative 04/30/2023 1  % Final   Basophils Absolute 04/30/2023 0.1  0.0 - 0.1 K/uL Final   Immature Granulocytes 04/30/2023 1  % Final   Abs Immature Granulocytes 04/30/2023 0.07  0.00 - 0.07 K/uL Final   Pappenheimer Bodies 04/30/2023 PRESENT   Final   Polychromasia 04/30/2023 PRESENT   Final   Performed at [facility], [address] [place], [place] [address]   Sodium 04/30/2023 132 (L)  135 - 145 mmol/L Final   Potassium 04/30/2023 4.4  3.5 - 5.1 mmol/L Final   Chloride 04/30/2023 96 (L)  98 - 111 mmol/L Final   CO2 04/30/2023 27  22 - 32 mmol/L Final   Glucose, Bld 04/30/2023 77  70 - 99 mg/dL Final   Glucose reference range applies only to samples taken after fasting for at least 8 hours.   BUN 04/30/2023 8  6 - 20 mg/dL Final   Creatinine, Ser 04/30/2023 1.15  0.61 - 1.24 mg/dL Final    Calcium 60/45/4098 11.0 (H)  8.9 - 10.3 mg/dL Final   Total Protein 11/91/4782 7.2  6.5 - 8.1 g/dL Final   Albumin 95/62/1308 4.2  3.5 - 5.0 g/dL Final   AST 65/78/4696 29  15 - 41 U/L Final   ALT 04/30/2023 23  0 - 44 U/L Final   Alkaline Phosphatase 04/30/2023 99  38 - 126 U/L Final   Total Bilirubin 04/30/2023 3.2 (H)  0.0 - 1.2 mg/dL Final   GFR, Estimated 04/30/2023 >60  >60 mL/min Final   Comment: (NOTE) Calculated using the CKD-EPI Creatinine Equation (2021)    Anion gap 04/30/2023 9  5 - 15 Final   Performed at [facility], [address] [place], [place] [address]   Alcohol, Ethyl (B) 04/30/2023 <10  <10 mg/dL Final   Comment: (NOTE) Lowest detectable limit for serum alcohol is 10 mg/dL.  For medical purposes only. Performed at [facility], [address] [place], [place] [address]    TSH 04/30/2023 3.540  0.350 - 4.500 uIU/mL Final   Comment: Performed by a 3rd Generation assay with a functional sensitivity of <=0.01 uIU/mL. Performed at [facility], [address], [place], [place] [address]    POC Amphetamine UR 05/01/2023 None Detected  NONE DETECTED (Cut Off Level 1000 ng/mL) Final   POC Secobarbital (BAR) 05/01/2023 None Detected  NONE DETECTED (Cut Off Level 300 ng/mL) Final   POC Buprenorphine (BUP) 05/01/2023 None Detected  NONE DETECTED (Cut Off Level 10 ng/mL) Final   POC Oxazepam (BZO) 05/01/2023 None Detected  NONE DETECTED (Cut Off Level 300 ng/mL) Final  POC Cocaine UR 05/01/2023 Positive (A)  NONE DETECTED (Cut Off Level 300 ng/mL) Final   POC Methamphetamine UR 05/01/2023 None Detected  NONE DETECTED (Cut Off Level 1000 ng/mL) Final   POC Morphine 05/01/2023 None Detected  NONE DETECTED (Cut Off Level 300 ng/mL) Final   POC Methadone UR 05/01/2023 None Detected  NONE DETECTED (Cut Off Level 300 ng/mL) Final   POC Oxycodone UR 05/01/2023 None Detected  NONE DETECTED (Cut Off Level 100 ng/mL) Final   POC Marijuana UR 05/01/2023 None  Detected  NONE DETECTED (Cut Off Level 50 ng/mL) Final  Admission on 04/01/2023, Discharged on 04/03/2023  Component Date Value Ref Range Status   Sodium 04/01/2023 134 (L)  135 - 145 mmol/L Final   Potassium 04/01/2023 3.5  3.5 - 5.1 mmol/L Final   Chloride 04/01/2023 102  98 - 111 mmol/L Final   CO2 04/01/2023 19 (L)  22 - 32 mmol/L Final   Glucose, Bld 04/01/2023 158 (H)  70 - 99 mg/dL Final   Glucose reference range applies only to samples taken after fasting for at least 8 hours.   BUN 04/01/2023 9  6 - 20 mg/dL Final   Creatinine, Ser 04/01/2023 1.26 (H)  0.61 - 1.24 mg/dL Final   Calcium 78/29/5621 10.9 (H)  8.9 - 10.3 mg/dL Final   Total Protein 30/86/5784 7.5  6.5 - 8.1 g/dL Final   Albumin 69/62/9528 4.1  3.5 - 5.0 g/dL Final   AST 41/32/4401 71 (H)  15 - 41 U/L Final   ALT 04/01/2023 47 (H)  0 - 44 U/L Final   Alkaline Phosphatase 04/01/2023 83  38 - 126 U/L Final   Total Bilirubin 04/01/2023 1.8 (H)  0.0 - 1.2 mg/dL Final   GFR, Estimated 04/01/2023 >60  >60 mL/min Final   Comment: (NOTE) Calculated using the CKD-EPI Creatinine Equation (2021)    Anion gap 04/01/2023 13  5 - 15 Final   Performed at [facility], [address] [place], [place] [address]   WBC 04/01/2023 8.8  4.0 - 10.5 K/uL Final   RBC 04/01/2023 4.97  4.22 - 5.81 MIL/uL Final   Hemoglobin 04/01/2023 14.8  13.0 - 17.0 g/dL Final   HCT 36/64/4034 40.7  39.0 - 52.0 % Final   MCV 04/01/2023 81.9  80.0 - 100.0 fL Final   MCH 04/01/2023 29.8  26.0 - 34.0 pg Final   MCHC 04/01/2023 36.4 (H)  30.0 - 36.0 g/dL Final   RDW 74/25/9563 15.2  11.5 - 15.5 % Final   Platelets 04/01/2023 301  150 - 400 K/uL Final   nRBC 04/01/2023 1.9 (H)  0.0 - 0.2 % Final   Neutrophils Relative % 04/01/2023 67  % Final   Neutro Abs 04/01/2023 5.9  1.7 - 7.7 K/uL Final   Lymphocytes Relative 04/01/2023 11  % Final   Lymphs Abs 04/01/2023 1.0  0.7 - 4.0 K/uL Final   Monocytes Relative 04/01/2023 20  % Final    Monocytes Absolute 04/01/2023 1.8 (H)  0.1 - 1.0 K/uL Final   Eosinophils Relative 04/01/2023 0  % Final   Eosinophils Absolute 04/01/2023 0.0  0.0 - 0.5 K/uL Final   Basophils Relative 04/01/2023 1  % Final   Basophils Absolute 04/01/2023 0.1  0.0 - 0.1 K/uL Final   Immature Granulocytes 04/01/2023 1  % Final   Abs Immature Granulocytes 04/01/2023 0.05  0.00 - 0.07 K/uL Final   Performed at [facility], [address]  [place], [place] [address]   Retic Ct Pct 04/01/2023 3.4 (H)  0.4 - 3.1 % Final   Comment: REPEATED TO VERIFY CONFIRMED BY MANUAL DILUTION    RBC. 04/01/2023 4.94  4.22 - 5.81 MIL/uL Final   Retic Count, Absolute 04/01/2023 170.0  19.0 - 186.0 K/uL Final   Immature Retic Fract 04/01/2023 13.3  2.3 - 15.9 % Final   Performed at [facility], [address] [place], [place] [address]   SARS Coronavirus 2 by RT PCR 04/01/2023 POSITIVE (A)  NEGATIVE Final   Comment: (NOTE) SARS-CoV-2 target nucleic acids are DETECTED.  The SARS-CoV-2 RNA is generally detectable in upper respiratory specimens during the acute phase of infection. Positive results are indicative of the presence of the identified virus, but do not rule out bacterial infection or co-infection with other pathogens not detected by the test. Clinical correlation with patient history and other diagnostic information is necessary to determine patient infection status. The expected result is Negative.  Fact Sheet for Patients: BloggerCourse.com  Fact Sheet for Healthcare Providers: SeriousBroker.it  This test is not yet approved or cleared by the [place] FDA and  has been authorized for detection and/or diagnosis of SARS-CoV-2 by FDA under an Emergency Use Authorization (EUA).  This EUA will remain in effect (meaning this test can be used) for the duration of  the COVID-19 declaration under Section 564(b)(1) of the A                           ct, 21 U.S.C. section 360bbb-3(b)(1), unless the authorization is terminated or revoked sooner.     Influenza A by PCR 04/01/2023 NEGATIVE  NEGATIVE Final   Influenza B by PCR 04/01/2023 NEGATIVE  NEGATIVE Final   Comment: (NOTE) The Xpert Xpress SARS-CoV-2/FLU/RSV plus assay is intended as an aid in the diagnosis of influenza from Nasopharyngeal swab specimens and should not be used as a sole basis for treatment. Nasal washings and aspirates are unacceptable for Xpert Xpress SARS-CoV-2/FLU/RSV testing.  Fact Sheet for Patients: BloggerCourse.com  Fact Sheet for Healthcare Providers: SeriousBroker.it  This test is not yet approved or cleared by the [place] FDA and has been authorized for detection and/or diagnosis of SARS-CoV-2 by FDA under an Emergency Use Authorization (EUA). This EUA will remain in effect (meaning this test can be used) for the duration of the COVID-19 declaration under Section 564(b)(1) of the Act, 21 U.S.C. section 360bbb-3(b)(1), unless the authorization is terminated or revoked.     Resp Syncytial Virus by PCR 04/01/2023 NEGATIVE  NEGATIVE Final   Comment: (NOTE) Fact Sheet for Patients: BloggerCourse.com  Fact Sheet for Healthcare Providers: SeriousBroker.it  This test is not yet approved or cleared by the [place] FDA and has been authorized for detection and/or diagnosis of SARS-CoV-2 by FDA under an Emergency Use Authorization (EUA). This EUA will remain in effect (meaning this test can be used) for the duration of the COVID-19 declaration under Section 564(b)(1) of the Act, 21 U.S.C. section 360bbb-3(b)(1), unless the authorization is terminated or revoked.  Performed at [facility], [address], [place], [place] [address]    Troponin I (High Sensitivity) 04/01/2023 12  <18 ng/L Final    Comment: (NOTE) Elevated high sensitivity troponin I (hsTnI) values and significant  changes across serial measurements may suggest ACS but many other  chronic and acute conditions are known to elevate hsTnI results.  Refer to the "Links" section  for chest pain algorithms and additional  guidance. Performed at [facility], [address], [place], [place] [address]    Opiates 04/01/2023 POSITIVE (A)  NONE DETECTED Final   Cocaine 04/01/2023 POSITIVE (A)  NONE DETECTED Final   Benzodiazepines 04/01/2023 NONE DETECTED  NONE DETECTED Final   Amphetamines 04/01/2023 NONE DETECTED  NONE DETECTED Final   Tetrahydrocannabinol 04/01/2023 NONE DETECTED  NONE DETECTED Final   Barbiturates 04/01/2023 NONE DETECTED  NONE DETECTED Final   Comment: (NOTE) DRUG SCREEN FOR MEDICAL PURPOSES ONLY.  IF CONFIRMATION IS NEEDED FOR ANY PURPOSE, NOTIFY LAB WITHIN 5 DAYS.  LOWEST DETECTABLE LIMITS FOR URINE DRUG SCREEN Drug Class                     Cutoff (ng/mL) Amphetamine and metabolites    1000 Barbiturate and metabolites    200 Benzodiazepine                 200 Opiates and metabolites        300 Cocaine and metabolites        300 THC                            50 Performed at [facility], [address] [place], [place] [address]    Sodium 04/02/2023 133 (L)  135 - 145 mmol/L Final   Potassium 04/02/2023 3.6  3.5 - 5.1 mmol/L Final   Chloride 04/02/2023 103  98 - 111 mmol/L Final   CO2 04/02/2023 21 (L)  22 - 32 mmol/L Final   Glucose, Bld 04/02/2023 132 (H)  70 - 99 mg/dL Final   Glucose reference range applies only to samples taken after fasting for at least 8 hours.   BUN 04/02/2023 7  6 - 20 mg/dL Final   Creatinine, Ser 04/02/2023 0.91  0.61 - 1.24 mg/dL Final   Calcium 86/57/8469 10.0  8.9 - 10.3 mg/dL Final   GFR, Estimated 04/02/2023 >60  >60 mL/min Final   Comment: (NOTE) Calculated using the CKD-EPI Creatinine Equation (2021)    Anion  gap 04/02/2023 9  5 - 15 Final   Performed at [facility], [address], [place], [place] [address]   WBC 04/02/2023 9.2  4.0 - 10.5 K/uL Final   RBC 04/02/2023 4.71  4.22 - 5.81 MIL/uL Final   Hemoglobin 04/02/2023 14.0  13.0 - 17.0 g/dL Final   HCT 84/13/2440 38.0 (L)  39.0 - 52.0 % Final   MCV 04/02/2023 80.7  80.0 - 100.0 fL Final   MCH 04/02/2023 29.7  26.0 - 34.0 pg Final   MCHC 04/02/2023 36.8 (H)  30.0 - 36.0 g/dL Final   RDW 12/10/2534 14.6  11.5 - 15.5 % Final   Platelets 04/02/2023 247  150 - 400 K/uL Final   nRBC 04/02/2023 4.4 (H)  0.0 - 0.2 % Final   Performed at [facility], [address], [place], [place] [address]   Magnesium 04/02/2023 2.0  1.7 - 2.4 mg/dL Final   Performed at [facility], [address], [place], [place] [address]   Phosphorus 04/02/2023 2.4 (L)  2.5 - 4.6 mg/dL Final   Performed at [facility], [address], [place], [place] [address]   Alcohol, Ethyl (B) 04/02/2023 <10  <10 mg/dL Final   Comment: (NOTE) Lowest detectable limit for serum alcohol is 10 mg/dL.  For  medical purposes only. Performed at [facility], [address] [place], [place] [address]    Total Protein 04/02/2023 6.7  6.5 - 8.1 g/dL Final   Albumin 60/11/9321 3.9  3.5 - 5.0 g/dL Final   AST 55/73/2202 213 (H)  15 - 41 U/L Final   ALT 04/02/2023 104 (H)  0 - 44 U/L Final   Alkaline Phosphatase 04/02/2023 86  38 - 126 U/L Final   Total Bilirubin 04/02/2023 2.0 (H)  0.0 - 1.2 mg/dL Final   Bilirubin, Direct 04/02/2023 0.4 (H)  0.0 - 0.2 mg/dL Final   Indirect Bilirubin 04/02/2023 1.6 (H)  0.3 - 0.9 mg/dL Final   Performed at [facility], [address], [place], [place] [address]   HEP B CORE AB 04/02/2023 Negative  Negative Final   Comment: (NOTE) Performed At: [facility] [address] [place], [place] [id number] Jolene Schimke MD  DV:[id number]    Hep B S Ab 04/02/2023 NON REACTIVE  NON REACTIVE Final   Comment: (NOTE) Inconsistent with immunity, less than 10 mIU/mL.  Performed at [facility], [address] [place], [place] [address]    Hepatitis B Surface Ag 04/02/2023 NON REACTIVE  NON REACTIVE Final   Performed at [facility], [address] [place], [place] [address]   HCV Ab 04/02/2023 NON REACTIVE  NON REACTIVE Final   Comment: (NOTE) Nonreactive HCV antibody screen is consistent with no HCV infections,  unless recent infection is suspected or other evidence exists to indicate HCV infection.  Performed at [facility], [address] [place], [place] [address]   Office Visit on 11/23/2022  Component Date Value Ref Range Status   Amphetamines, IA 11/23/2022 Negative  Cutoff:50 ng/mL Final   Barbiturates, IA 11/23/2022 Negative  Cutoff:0.1 ug/mL Final   Benzodiazepines, IA 11/23/2022 Negative  Cutoff:20 ng/mL Final   Cocaine & Metabolite, IA 11/23/2022 Negative  Cutoff:25 ng/mL Final   Phencyclidine, IA 11/23/2022 Negative  Cutoff:8 ng/mL Final   THC(Marijuana) Metabolite, IA 11/23/2022 Negative  Cutoff:5 ng/mL Final   Opiates, IA 11/23/2022 Negative  Cutoff:5 ng/mL Final   Oxycodones, IA 11/23/2022 Negative  Cutoff:5 ng/mL Final   Methadone, IA 11/23/2022 Negative  Cutoff:25 ng/mL Final   Propoxyphene, IA 11/23/2022 Negative  Cutoff:50 ng/mL Final   Comment: This test was developed and its performance characteristics determined by [facility].  It has not been cleared or approved by the Food and Drug Administration.   Lab on 11/09/2022  Component Date Value Ref Range Status   Glucose 11/09/2022 86  70 - 99 mg/dL Final   BUN 09/38/1829 13  6 - 24 mg/dL Final   Creatinine, Ser 11/09/2022 1.42 (H)  0.76 - 1.27 mg/dL Final   eGFR 93/71/6967 62  >59 mL/min/1.73 Final   BUN/Creatinine Ratio 11/09/2022 9  9 - 20 Final   Sodium 11/09/2022 135  134 - 144 mmol/L Final   Potassium  11/09/2022 4.3  3.5 - 5.2 mmol/L Final   Chloride 11/09/2022 101  96 - 106 mmol/L Final   CO2 11/09/2022 22  20 - 29 mmol/L Final   Calcium 11/09/2022 10.4 (H)  8.7 - 10.2 mg/dL Final   Total Protein 89/38/1017 6.9  6.0 - 8.5 g/dL Final   Albumin 51/03/5850 4.3  4.1 - 5.1 g/dL Final   Globulin, Total 11/09/2022 2.6  1.5 - 4.5 g/dL Final   Bilirubin Total 11/09/2022 1.2  0.0 - 1.2 mg/dL Final   Alkaline  Phosphatase 11/09/2022 93  44 - 121 IU/L Final   AST 11/09/2022 25  0 - 40 IU/L Final   ALT 11/09/2022 21  0 - 44 IU/L Final   Ferritin 11/09/2022 128  30 - 400 ng/mL Final   Vit D, 25-Hydroxy 11/09/2022 23.7 (L)  30.0 - 100.0 ng/mL Final   Comment: Vitamin D deficiency has been defined by the Institute of Medicine and an Endocrine Society practice guideline as a level of serum 25-OH vitamin D less than 20 ng/mL (1,2). The Endocrine Society went on to further define vitamin D insufficiency as a level between 21 and 29 ng/mL (2). 1. IOM (Institute of Medicine). 2010. Dietary reference    intakes for calcium and D. [place]: The    [employer]. 2. Holick MF, Binkley Paola, Bischoff-Ferrari HA, et al.    Evaluation, treatment, and prevention of vitamin D    deficiency: an Endocrine Society clinical practice    guideline. JCEM. 2011 Jul; 96(7):1911-30.    WBC 11/09/2022 13.0 (H)  3.4 - 10.8 x10E3/uL Final   RBC 11/09/2022 4.68  4.14 - 5.80 x10E6/uL Final   Hemoglobin 11/09/2022 14.4  13.0 - 17.7 g/dL Final   Hematocrit 09/81/1914 41.0  37.5 - 51.0 % Final   MCV 11/09/2022 88  79 - 97 fL Final   MCH 11/09/2022 30.8  26.6 - 33.0 pg Final   MCHC 11/09/2022 35.1  31.5 - 35.7 g/dL Final   RDW 78/29/5621 15.7 (H)  11.6 - 15.4 % Final   Platelets 11/09/2022 303  150 - 450 x10E3/uL Final   Neutrophils 11/09/2022 52  Not Estab. % Final   Lymphs 11/09/2022 32  Not Estab. % Final   Monocytes 11/09/2022 12  Not Estab. % Final   Eos 11/09/2022 3  Not Estab. % Final   Basos  11/09/2022 1  Not Estab. % Final   Neutrophils Absolute 11/09/2022 6.7  1.4 - 7.0 x10E3/uL Final   Lymphocytes Absolute 11/09/2022 4.2 (H)  0.7 - 3.1 x10E3/uL Final   Monocytes Absolute 11/09/2022 1.6 (H)  0.1 - 0.9 x10E3/uL Final   EOS (ABSOLUTE) 11/09/2022 0.4  0.0 - 0.4 x10E3/uL Final   Basophils Absolute 11/09/2022 0.1  0.0 - 0.2 x10E3/uL Final   Immature Granulocytes 11/09/2022 0  Not Estab. % Final   Immature Grans (Abs) 11/09/2022 0.0  0.0 - 0.1 x10E3/uL Final   NRBC 11/09/2022 1 (H)  0 - 0 % Final   Retic Ct Pct 11/09/2022 3.6 (H)  0.6 - 2.6 % Final   Ethyl Alcohol, Enz 11/09/2022 Negative  Cutoff:0.020 gm/dL Final   Amphetamines, IA 11/09/2022 Negative  Cutoff:50 ng/mL Final   Barbiturates, IA 11/09/2022 Negative  Cutoff:0.1 ug/mL Final   Benzodiazepines, IA 11/09/2022 Negative  Cutoff:20 ng/mL Final   Cocaine & Metabolite, IA 11/09/2022 ++POSITIVE++ (A)  Cutoff:25 ng/mL Final   Phencyclidine, IA 11/09/2022 Negative  Cutoff:8 ng/mL Final   THC(Marijuana) Metabolite, IA 11/09/2022 Negative  Cutoff:5 ng/mL Final   Opiates, IA 11/09/2022 Negative  Cutoff:5 ng/mL Final   Oxycodones, IA 11/09/2022 Negative  Cutoff:5 ng/mL Final   Methadone, IA 11/09/2022 Negative  Cutoff:25 ng/mL Final   Propoxyphene, [place] 11/09/2022 Negative  Cutoff:50 ng/mL Final   Comment: This test was developed and its performance characteristics determined by [facility].  It has not been cleared or approved by the Food and Drug Administration.    Cocaine Confirmation 11/09/2022 Positive   Final   Cocaine 11/09/2022 Negative  ng/mL Final   Benzoylecgonine 11/09/2022  22  ng/mL Final   Confirmation threshold: 10 ng/mL    Blood Alcohol level:  Lab Results  Component Value Date   ETH <10 04/30/2023   ETH <10 04/02/2023    Metabolic Disorder Labs: No results found for: "HGBA1C", "MPG" No results found for: "PROLACTIN" Lab Results  Component Value Date   CHOL  11/06/2008    84        ATP III  CLASSIFICATION:  <200     mg/dL   Desirable  161-096  mg/dL   Borderline High  >=045    mg/dL   High          TRIG 44 11/06/2008   HDL 31 (L) 11/06/2008   CHOLHDL 2.7 11/06/2008   VLDL 9 11/06/2008   LDLCALC  11/06/2008    44        Total Cholesterol/HDL:CHD Risk Coronary Heart Disease Risk Table                     Men   Women  1/2 Average Risk   3.4   3.3  Average Risk       5.0   4.4  2 X Average Risk   9.6   7.1  3 X Average Risk  23.4   11.0        Use the calculated Patient Ratio above and the CHD Risk Table to determine the patient's CHD Risk.        ATP III CLASSIFICATION (LDL):  <100     mg/dL   Optimal  409-811  mg/dL   Near or Above                    Optimal  130-159  mg/dL   Borderline  914-782  mg/dL   High  >956     mg/dL   Very High    Therapeutic Lab Levels: No results found for: "LITHIUM" No results found for: "VALPROATE" No results found for: "CBMZ"  Physical Findings   PHQ2-9    Flowsheet Row ED from 04/30/2023 in [facility] Office Visit from 10/25/2022 in [facility] Of [facility] Office Visit from 07/17/2022 in [facility] Of [facility]ide[facility] Office Visit from 04/17/2022 in [facility] Of [facility]igh[facility] Bridegroom [facility] Office Visit from 01/12/2022 in [facility] [place] [facility]  PHQ-2 Total Score 0 0 0 0 0  PHQ-9 Total Score 2 0 -- -- --      Flowsheet Row ED from 04/30/2023 in [facility] ED to Hosp-Admission (Discharged) from 04/01/2023 in [facility] ED to Hosp-Admission (Discharged) from 06/27/2022 in [facility]  C-SSRS RISK CATEGORY No Risk No Risk No Risk        Musculoskeletal  Strength & Muscle Tone: within normal limits Gait & Station: normal Patient leans:  N/A  Psychiatric Specialty Exam  Presentation  General Appearance:  Casual  Eye Contact: Fleeting  Speech: Clear and Coherent  Speech Volume: Normal  Handedness: Right   Mood and Affect  Mood: Anxious  Affect: Congruent   Thought Process  Thought Processes: Coherent  Descriptions of Associations:Intact  Orientation:Full (Time, Place and Person)  Thought Content:Logical  Diagnosis of Schizophrenia or Schizoaffective disorder in  past: No    Hallucinations:No data recorded  Ideas of Reference:None  Suicidal Thoughts:No data recorded  Homicidal Thoughts:No data recorded   Sensorium  Memory: Immediate Fair  Judgment: Poor  Insight: Fair   Chartered certified accountant: Fair  Attention Span: Fair  Recall: [employer] of Knowledge:No data recorded Language: Good   Psychomotor Activity  Psychomotor Activity: No data recorded   Assets  Assets: Desire for Improvement; Housing; Resilience; Vocational/Educational   Sleep  Sleep: No data recorded   No data recorded   Physical Exam  Physical Exam Vitals and nursing note reviewed.  Constitutional:      Appearance: Normal appearance.  HENT:     Head: Normocephalic.  Neurological:     General: No focal deficit present.     Mental Status: He is alert and oriented to person, place, and time. Mental status is at baseline.  Psychiatric:        Mood and Affect: Mood normal.        Behavior: Behavior normal.    Review of Systems  Psychiatric/Behavioral:  Negative for depression and substance abuse. The patient does not have insomnia.   All other systems reviewed and are negative.  Blood pressure 120/82, pulse 73, temperature 98.1 F (36.7 C), temperature source Oral, resp. rate 16, SpO2 100%. There is no height or weight on file to calculate BMI.  Treatment Plan Summary: Daily contact with patient to assess and evaluate symptoms and progress in treatment, Medication  management, and Plan the patient is admitted to the inpatient program at [facility]. Marland Kitchen  Short-term goals are to verbalize his feelings in meetings with the treatment team and attend at least 50% of groups daily.  PHQ-9 on day 3 was equal to 2.  He will take medications as prescribed daily if indicated.  He will consider a long-term rehab facility and he will be provided information so he can contact them while at the [facility].  He is strongly encouraged to contact the social worker and discuss.  He is also encouraged to contact the facilities offering rehab ASAP.  The plan [facility] or [facility]. Prognosis is fair to guarded.  Rex Kras, MD 05/03/2023 8:45 AM

## 2023-05-03 NOTE — Group Note (Signed)
 Group Topic: Change and Accountability  Group Date: 05/03/2023 Start Time: 1900 End Time: 1959 Facilitators: Rae Lips B  Department: [facility]  Number of Participants: 5  Group Focus: abuse issues, acceptance, activities of daily living skills, anger management, anxiety, chemical dependency issues, relapse prevention, and substance abuse education Treatment Modality:  Exposure Therapy and Individual Therapy Interventions utilized were leisure development, problem solving, story telling, and support Purpose: enhance coping skills, express feelings, express irrational fears, increase insight, regain self-worth, reinforce self-care, relapse prevention strategies, and trigger / craving management  Name: Dean Webb Date of Birth: [birth year]  MR: [id number]    Level of Participation: active Quality of Participation: cooperative Interactions with others: gave feedback Mood/Affect: appropriate Triggers (if applicable): NA Cognition: coherent/clear Progress: Gaining insight Response: NA Plan: patient will be encouraged to keep attending groups.   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

## 2023-05-03 NOTE — Group Note (Unsigned)
 Group Topic: Balance in Life  Group Date: 05/03/2023 Start Time: 1200 End Time: 1220 Facilitators: Vonzell Schlatter B  Department: [facility]  Number of Participants: 8  Group Focus: coping skills and daily focus Treatment Modality:  Psychoeducation Interventions utilized were problem solving and support Purpose: regain self-worth and reinforce self-care   Name: Dean Webb Date of Birth: [birth year]  MR: [id number]    Level of Participation: {THERAPIES; PSYCH GROUP PARTICIPATION QQVZD:63875} Quality of Participation: {THERAPIES; PSYCH QUALITY OF PARTICIPATION:23992} Interactions with others: {THERAPIES; PSYCH INTERACTIONS:23993} Mood/Affect: {THERAPIES; PSYCH MOOD/AFFECT:23994} Triggers (if applicable): *** Cognition: {THERAPIES; PSYCH COGNITION:23995} Progress: {THERAPIES; PSYCH PROGRESS:23997} Response: *** Plan: {THERAPIES; PSYCH IEPP:29518}  Patients Problems:  Patient Active Problem List   Diagnosis Date Noted   Substance abuse (HCC) 04/30/2023   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

## 2023-05-04 DIAGNOSIS — F339 Major depressive disorder, recurrent, unspecified: Secondary | ICD-10-CM | POA: Diagnosis not present

## 2023-05-04 DIAGNOSIS — Z5900 Homelessness unspecified: Secondary | ICD-10-CM | POA: Diagnosis not present

## 2023-05-04 DIAGNOSIS — F1410 Cocaine abuse, uncomplicated: Secondary | ICD-10-CM | POA: Diagnosis not present

## 2023-05-04 DIAGNOSIS — F419 Anxiety disorder, unspecified: Secondary | ICD-10-CM | POA: Diagnosis not present

## 2023-05-04 NOTE — ED Provider Notes (Signed)
 Behavioral Health Progress Note  Date and Time: 05/04/2023 10:25 AM Name: Dean Webb MRN:  [id number]  HPI:  Dean Webb 48 y/o male with a history of polysubstance use, homelessness.  Presented to [facility] voluntarily.  Per the patient he is trying to get some help with his cocaine abuse.  According to the patient he has tried outpatient services before but he relapsed a couple months ago patient also reports he is currently homeless.  Patient is also unemployed.  Denies seeing a therapist or a psychiatrist at this time.   Subjective: The patient was seen and evaluated and the chart was reviewed and the case was discussed with the treatment team.  Staff reports no concerns last night he slept fairly well and reports no acute signs and symptoms of cravings.  He is concerned that he would relapse without going to a long-term rehab program.  He was hoping to go to [facility] but apparently has been denied.  Social worker indicates that he has an interview with [facility] and should be able to find out over the weekend and possibly discharge by Monday.  When seen today he is alert and oriented and cooperative.  He maintained fair to good eye contact.  He denied any active SI/HI/AVH.  He is able to contract for safety.  He reports that his brother is supportive and if he cannot go to [facility], he will try to contact his brother.  However he would benefit from long-term rehab.  Vital signs 136/84, 79, 20, 98%.   PHQ-9 =2 Diagnosis:  Cocaine use disorder Major depression, recurrent, remission status unspecified.  Final diagnoses:  Cocaine abuse (HCC)  Homelessness unspecified  Anxious appearance  Recurrent major depressive disorder, remission status unspecified (HCC)    Total Time spent with patient: 30 minutes  Past Psychiatric History: Patient denies any prior history of psychiatric admissions or treatments. Past Medical History: Records indicate history of sickle cell anemia, GERD, asthma,  hypertension, OSA and rhabdomyolysis in the past. Family History: Unknown at this time Family Psychiatric  History: Unknown at this time. Social History: Patient reports that he is in a complicated relationship but would not provide information about his living arrangement.  He is homeless.  He lives in [place].  He does have family and has 4 children.  He is currently unemployed.  Additional Social History:    Pain Medications: See MAR Prescriptions: See MAR Over the Counter: See MAR History of alcohol / drug use?: Yes Longest period of sobriety (when/how long): 2-3 weeks Negative Consequences of Use: Financial, Personal relationships Withdrawal Symptoms: None Name of Substance 1: cocaine 1 - Age of First Use: 46 1 - Amount (size/oz): 4 grams 1 - Frequency: daily 1 - Last Use / Amount: within last 24 hours                  Sleep: Fair  Appetite:  Good  Current Medications:  Current Facility-Administered Medications  Medication Dose Route Frequency Provider Last Rate Last Admin   acetaminophen (TYLENOL) tablet 650 mg  650 mg Oral Q6H PRN Sindy Guadeloupe, NP       alum & mag hydroxide-simeth (MAALOX/MYLANTA) 200-200-20 MG/5ML suspension 30 mL  30 mL Oral Q4H PRN Sindy Guadeloupe, NP       Melene Muller ON 05/05/2023] cloNIDine (CATAPRES) tablet 0.1 mg  0.1 mg Oral QAC breakfast Sindy Guadeloupe, NP       dicyclomine (BENTYL) tablet 20 mg  20 mg Oral Q6H PRN Sindy Guadeloupe, NP  hydrOXYzine (ATARAX) tablet 25 mg  25 mg Oral Q6H PRN Sindy Guadeloupe, NP   25 mg at 05/03/23 2132   loperamide (IMODIUM) capsule 2-4 mg  2-4 mg Oral PRN Sindy Guadeloupe, NP       magnesium hydroxide (MILK OF MAGNESIA) suspension 30 mL  30 mL Oral Daily PRN Sindy Guadeloupe, NP       methocarbamol (ROBAXIN) tablet 500 mg  500 mg Oral Q8H PRN Sindy Guadeloupe, NP       naproxen (NAPROSYN) tablet 500 mg  500 mg Oral BID PRN Sindy Guadeloupe, NP   500 mg at 05/03/23 2132   OLANZapine (ZYPREXA) injection 10 mg  10 mg  Intramuscular TID PRN Sindy Guadeloupe, NP       OLANZapine (ZYPREXA) injection 5 mg  5 mg Intramuscular TID PRN Sindy Guadeloupe, NP       OLANZapine zydis (ZYPREXA) disintegrating tablet 5 mg  5 mg Oral TID PRN Sindy Guadeloupe, NP       ondansetron (ZOFRAN-ODT) disintegrating tablet 4 mg  4 mg Oral Q6H PRN Sindy Guadeloupe, NP       Current Outpatient Medications  Medication Sig Dispense Refill   albuterol (VENTOLIN HFA) 108 (90 Base) MCG/ACT inhaler TAKE 2 PUFFS BY MOUTH EVERY 6 HOURS AS NEEDED FOR WHEEZE OR SHORTNESS OF BREATH 18 each 1   amLODipine (NORVASC) 10 MG tablet Take 1 tablet (10 mg total) by mouth daily. 90 tablet 0   cetirizine (ZYRTEC) 10 MG tablet TAKE 1 TABLET BY MOUTH EVERY DAY AS NEEDED FOR ALLERGY 90 tablet 1   cloNIDine (CATAPRES) 0.1 MG tablet TAKE 1 TABLET BY MOUTH 2 TIMES DAILY. (Patient taking differently: Take 0.3 mg by mouth daily.) 60 tablet 2   DULoxetine (CYMBALTA) 30 MG capsule Take 1 capsule (30 mg total) by mouth daily. 90 capsule 1   gabapentin (NEURONTIN) 300 MG capsule Take 1 capsule (300 mg total) by mouth 2 (two) times daily. 90 capsule 3   losartan-hydrochlorothiazide (HYZAAR) 100-25 MG tablet Take 1 tablet by mouth daily. 90 tablet 0   metoprolol succinate (TOPROL-XL) 50 MG 24 hr tablet Take 1 tablet (50 mg total) by mouth daily. Take with or immediately following a meal. 90 tablet 3   montelukast (SINGULAIR) 10 MG tablet TAKE 1 TABLET BY MOUTH EVERYDAY AT BEDTIME 90 tablet 1   nicotine (NICODERM CQ - DOSED IN MG/24 HOURS) 21 mg/24hr patch Place 1 patch (21 mg total) onto the skin daily. 28 patch 0   oxyCODONE (ROXICODONE) 15 MG immediate release tablet Take 1 tablet (15 mg total) by mouth every 8 (eight) hours as needed for pain. 8 tablet 0   pantoprazole (PROTONIX) 40 MG tablet Take 1 tablet (40 mg total) by mouth daily. 30 tablet 3   tiZANidine (ZANAFLEX) 4 MG tablet Take 1 tablet (4 mg total) by mouth every 6 (six) hours as needed for muscle spasms. 30 tablet 0    benzonatate (TESSALON) 100 MG capsule Take 1 capsule (100 mg total) by mouth 3 (three) times daily as needed for cough. (Patient not taking: Reported on 05/01/2023) 21 capsule 0    Labs  Lab Results:  Admission on 04/30/2023  Component Date Value Ref Range Status   WBC 04/30/2023 13.9 (H)  4.0 - 10.5 K/uL Final   RBC 04/30/2023 4.93  4.22 - 5.81 MIL/uL Final   Hemoglobin 04/30/2023 14.9  13.0 - 17.0 g/dL Final   HCT 62/13/0865 38.2 (L)  39.0 - 52.0 % Final   MCV  04/30/2023 77.5 (L)  80.0 - 100.0 fL Final   MCH 04/30/2023 30.2  26.0 - 34.0 pg Final   MCHC 04/30/2023 37.5 (H)  30.0 - 36.0 g/dL Final   REPEATED TO VERIFY   RDW 04/30/2023 15.1  11.5 - 15.5 % Final   Platelets 04/30/2023 314  150 - 400 K/uL Final   nRBC 04/30/2023 1.0 (H)  0.0 - 0.2 % Final   Neutrophils Relative % 04/30/2023 54  % Final   Neutro Abs 04/30/2023 7.7  1.7 - 7.7 K/uL Final   Lymphocytes Relative 04/30/2023 26  % Final   Lymphs Abs 04/30/2023 3.6  0.7 - 4.0 K/uL Final   Monocytes Relative 04/30/2023 13  % Final   Monocytes Absolute 04/30/2023 1.8 (H)  0.1 - 1.0 K/uL Final   Eosinophils Relative 04/30/2023 5  % Final   Eosinophils Absolute 04/30/2023 0.7 (H)  0.0 - 0.5 K/uL Final   Basophils Relative 04/30/2023 1  % Final   Basophils Absolute 04/30/2023 0.1  0.0 - 0.1 K/uL Final   Immature Granulocytes 04/30/2023 1  % Final   Abs Immature Granulocytes 04/30/2023 0.07  0.00 - 0.07 K/uL Final   Pappenheimer Bodies 04/30/2023 PRESENT   Final   Polychromasia 04/30/2023 PRESENT   Final   Performed at [facility], [address] [place], [place] [address]   Sodium 04/30/2023 132 (L)  135 - 145 mmol/L Final   Potassium 04/30/2023 4.4  3.5 - 5.1 mmol/L Final   Chloride 04/30/2023 96 (L)  98 - 111 mmol/L Final   CO2 04/30/2023 27  22 - 32 mmol/L Final   Glucose, Bld 04/30/2023 77  70 - 99 mg/dL Final   Glucose reference range applies only to samples taken after fasting for at least 8 hours.   BUN  04/30/2023 8  6 - 20 mg/dL Final   Creatinine, Ser 04/30/2023 1.15  0.61 - 1.24 mg/dL Final   Calcium 56/21/3086 11.0 (H)  8.9 - 10.3 mg/dL Final   Total Protein 57/84/6962 7.2  6.5 - 8.1 g/dL Final   Albumin 95/28/4132 4.2  3.5 - 5.0 g/dL Final   AST 44/02/270 29  15 - 41 U/L Final   ALT 04/30/2023 23  0 - 44 U/L Final   Alkaline Phosphatase 04/30/2023 99  38 - 126 U/L Final   Total Bilirubin 04/30/2023 3.2 (H)  0.0 - 1.2 mg/dL Final   GFR, Estimated 04/30/2023 >60  >60 mL/min Final   Comment: (NOTE) Calculated using the CKD-EPI Creatinine Equation (2021)    Anion gap 04/30/2023 9  5 - 15 Final   Performed at [facility], [address] [place], [place] [address]   Alcohol, Ethyl (B) 04/30/2023 <10  <10 mg/dL Final   Comment: (NOTE) Lowest detectable limit for serum alcohol is 10 mg/dL.  For medical purposes only. Performed at [facility], [address], [place], [place] [address]    TSH 04/30/2023 3.540  0.350 - 4.500 uIU/mL Final   Comment: Performed by a 3rd Generation assay with a functional sensitivity of <=0.01 uIU/mL. Performed at [facility], [address], [place], [place] [address]    POC Amphetamine UR 05/01/2023 None Detected  NONE DETECTED (Cut Off Level 1000 ng/mL) Final   POC Secobarbital (BAR) 05/01/2023 None Detected  NONE DETECTED (Cut Off Level 300 ng/mL) Final   POC Buprenorphine (BUP) 05/01/2023 None Detected  NONE DETECTED (Cut Off Level 10 ng/mL) Final   POC  Oxazepam (BZO) 05/01/2023 None Detected  NONE DETECTED (Cut Off Level 300 ng/mL) Final   POC Cocaine UR 05/01/2023 Positive (A)  NONE DETECTED (Cut Off Level 300 ng/mL) Final   POC Methamphetamine UR 05/01/2023 None Detected  NONE DETECTED (Cut Off Level 1000 ng/mL) Final   POC Morphine 05/01/2023 None Detected  NONE DETECTED (Cut Off Level 300 ng/mL) Final   POC Methadone UR 05/01/2023 None Detected  NONE DETECTED (Cut Off Level 300 ng/mL) Final   POC Oxycodone UR 05/01/2023  None Detected  NONE DETECTED (Cut Off Level 100 ng/mL) Final   POC Marijuana UR 05/01/2023 None Detected  NONE DETECTED (Cut Off Level 50 ng/mL) Final  Admission on 04/01/2023, Discharged on 04/03/2023  Component Date Value Ref Range Status   Sodium 04/01/2023 134 (L)  135 - 145 mmol/L Final   Potassium 04/01/2023 3.5  3.5 - 5.1 mmol/L Final   Chloride 04/01/2023 102  98 - 111 mmol/L Final   CO2 04/01/2023 19 (L)  22 - 32 mmol/L Final   Glucose, Bld 04/01/2023 158 (H)  70 - 99 mg/dL Final   Glucose reference range applies only to samples taken after fasting for at least 8 hours.   BUN 04/01/2023 9  6 - 20 mg/dL Final   Creatinine, Ser 04/01/2023 1.26 (H)  0.61 - 1.24 mg/dL Final   Calcium 40/98/1191 10.9 (H)  8.9 - 10.3 mg/dL Final   Total Protein 47/82/9562 7.5  6.5 - 8.1 g/dL Final   Albumin 13/09/6576 4.1  3.5 - 5.0 g/dL Final   AST 46/96/2952 71 (H)  15 - 41 U/L Final   ALT 04/01/2023 47 (H)  0 - 44 U/L Final   Alkaline Phosphatase 04/01/2023 83  38 - 126 U/L Final   Total Bilirubin 04/01/2023 1.8 (H)  0.0 - 1.2 mg/dL Final   GFR, Estimated 04/01/2023 >60  >60 mL/min Final   Comment: (NOTE) Calculated using the CKD-EPI Creatinine Equation (2021)    Anion gap 04/01/2023 13  5 - 15 Final   Performed at [facility], [address], [place], [place] [address]   WBC 04/01/2023 8.8  4.0 - 10.5 K/uL Final   RBC 04/01/2023 4.97  4.22 - 5.81 MIL/uL Final   Hemoglobin 04/01/2023 14.8  13.0 - 17.0 g/dL Final   HCT 44/02/270 40.7  39.0 - 52.0 % Final   MCV 04/01/2023 81.9  80.0 - 100.0 fL Final   MCH 04/01/2023 29.8  26.0 - 34.0 pg Final   MCHC 04/01/2023 36.4 (H)  30.0 - 36.0 g/dL Final   RDW 53/66/4403 15.2  11.5 - 15.5 % Final   Platelets 04/01/2023 301  150 - 400 K/uL Final   nRBC 04/01/2023 1.9 (H)  0.0 - 0.2 % Final   Neutrophils Relative % 04/01/2023 67  % Final   Neutro Abs 04/01/2023 5.9  1.7 - 7.7 K/uL Final   Lymphocytes Relative 04/01/2023 11  % Final    Lymphs Abs 04/01/2023 1.0  0.7 - 4.0 K/uL Final   Monocytes Relative 04/01/2023 20  % Final   Monocytes Absolute 04/01/2023 1.8 (H)  0.1 - 1.0 K/uL Final   Eosinophils Relative 04/01/2023 0  % Final   Eosinophils Absolute 04/01/2023 0.0  0.0 - 0.5 K/uL Final   Basophils Relative 04/01/2023 1  % Final   Basophils Absolute 04/01/2023 0.1  0.0 - 0.1 K/uL Final   Immature Granulocytes 04/01/2023 1  % Final   Abs Immature Granulocytes 04/01/2023 0.05  0.00 -  0.07 K/uL Final   Performed at [facility], [address] [place], [place] [address]   Retic Ct Pct 04/01/2023 3.4 (H)  0.4 - 3.1 % Final   Comment: REPEATED TO VERIFY CONFIRMED BY MANUAL DILUTION    RBC. 04/01/2023 4.94  4.22 - 5.81 MIL/uL Final   Retic Count, Absolute 04/01/2023 170.0  19.0 - 186.0 K/uL Final   Immature Retic Fract 04/01/2023 13.3  2.3 - 15.9 % Final   Performed at [facility], [address], [place], [place] [address]   SARS Coronavirus 2 by RT PCR 04/01/2023 POSITIVE (A)  NEGATIVE Final   Comment: (NOTE) SARS-CoV-2 target nucleic acids are DETECTED.  The SARS-CoV-2 RNA is generally detectable in upper respiratory specimens during the acute phase of infection. Positive results are indicative of the presence of the identified virus, but do not rule out bacterial infection or co-infection with other pathogens not detected by the test. Clinical correlation with patient history and other diagnostic information is necessary to determine patient infection status. The expected result is Negative.  Fact Sheet for Patients: BloggerCourse.com  Fact Sheet for Healthcare Providers: SeriousBroker.it  This test is not yet approved or cleared by the [place] FDA and  has been authorized for detection and/or diagnosis of SARS-CoV-2 by FDA under an Emergency Use Authorization (EUA).  This EUA will remain in effect (meaning this  test can be used) for the duration of  the COVID-19 declaration under Section 564(b)(1) of the A                          ct, 21 U.S.C. section 360bbb-3(b)(1), unless the authorization is terminated or revoked sooner.     Influenza A by PCR 04/01/2023 NEGATIVE  NEGATIVE Final   Influenza B by PCR 04/01/2023 NEGATIVE  NEGATIVE Final   Comment: (NOTE) The Xpert Xpress SARS-CoV-2/FLU/RSV plus assay is intended as an aid in the diagnosis of influenza from Nasopharyngeal swab specimens and should not be used as a sole basis for treatment. Nasal washings and aspirates are unacceptable for Xpert Xpress SARS-CoV-2/FLU/RSV testing.  Fact Sheet for Patients: BloggerCourse.com  Fact Sheet for Healthcare Providers: SeriousBroker.it  This test is not yet approved or cleared by the [place] FDA and has been authorized for detection and/or diagnosis of SARS-CoV-2 by FDA under an Emergency Use Authorization (EUA). This EUA will remain in effect (meaning this test can be used) for the duration of the COVID-19 declaration under Section 564(b)(1) of the Act, 21 U.S.C. section 360bbb-3(b)(1), unless the authorization is terminated or revoked.     Resp Syncytial Virus by PCR 04/01/2023 NEGATIVE  NEGATIVE Final   Comment: (NOTE) Fact Sheet for Patients: BloggerCourse.com  Fact Sheet for Healthcare Providers: SeriousBroker.it  This test is not yet approved or cleared by the [place] FDA and has been authorized for detection and/or diagnosis of SARS-CoV-2 by FDA under an Emergency Use Authorization (EUA). This EUA will remain in effect (meaning this test can be used) for the duration of the COVID-19 declaration under Section 564(b)(1) of the Act, 21 U.S.C. section 360bbb-3(b)(1), unless the authorization is terminated or revoked.  Performed at [facility], [address] [place], [place] [address]    Troponin I (High Sensitivity) 04/01/2023 12  <18 ng/L Final   Comment: (NOTE) Elevated high sensitivity troponin I (hsTnI) values and significant  changes across serial measurements may suggest ACS but many other  chronic  and acute conditions are known to elevate hsTnI results.  Refer to the "Links" section for chest pain algorithms and additional  guidance. Performed at [facility], [address], [place], [place] [address]    Opiates 04/01/2023 POSITIVE (A)  NONE DETECTED Final   Cocaine 04/01/2023 POSITIVE (A)  NONE DETECTED Final   Benzodiazepines 04/01/2023 NONE DETECTED  NONE DETECTED Final   Amphetamines 04/01/2023 NONE DETECTED  NONE DETECTED Final   Tetrahydrocannabinol 04/01/2023 NONE DETECTED  NONE DETECTED Final   Barbiturates 04/01/2023 NONE DETECTED  NONE DETECTED Final   Comment: (NOTE) DRUG SCREEN FOR MEDICAL PURPOSES ONLY.  IF CONFIRMATION IS NEEDED FOR ANY PURPOSE, NOTIFY LAB WITHIN 5 DAYS.  LOWEST DETECTABLE LIMITS FOR URINE DRUG SCREEN Drug Class                     Cutoff (ng/mL) Amphetamine and metabolites    1000 Barbiturate and metabolites    200 Benzodiazepine                 200 Opiates and metabolites        300 Cocaine and metabolites        300 THC                            50 Performed at [facility], [address], [place], [place] [address]    Sodium 04/02/2023 133 (L)  135 - 145 mmol/L Final   Potassium 04/02/2023 3.6  3.5 - 5.1 mmol/L Final   Chloride 04/02/2023 103  98 - 111 mmol/L Final   CO2 04/02/2023 21 (L)  22 - 32 mmol/L Final   Glucose, Bld 04/02/2023 132 (H)  70 - 99 mg/dL Final   Glucose reference range applies only to samples taken after fasting for at least 8 hours.   BUN 04/02/2023 7  6 - 20 mg/dL Final   Creatinine, Ser 04/02/2023 0.91  0.61 - 1.24 mg/dL Final   Calcium 11/91/4782 10.0  8.9 - 10.3 mg/dL Final   GFR, Estimated 04/02/2023 >60   >60 mL/min Final   Comment: (NOTE) Calculated using the CKD-EPI Creatinine Equation (2021)    Anion gap 04/02/2023 9  5 - 15 Final   Performed at [facility], [address] [place], [place] [address]   WBC 04/02/2023 9.2  4.0 - 10.5 K/uL Final   RBC 04/02/2023 4.71  4.22 - 5.81 MIL/uL Final   Hemoglobin 04/02/2023 14.0  13.0 - 17.0 g/dL Final   HCT 30/86/5784 38.0 (L)  39.0 - 52.0 % Final   MCV 04/02/2023 80.7  80.0 - 100.0 fL Final   MCH 04/02/2023 29.7  26.0 - 34.0 pg Final   MCHC 04/02/2023 36.8 (H)  30.0 - 36.0 g/dL Final   RDW 69/62/9528 14.6  11.5 - 15.5 % Final   Platelets 04/02/2023 247  150 - 400 K/uL Final   nRBC 04/02/2023 4.4 (H)  0.0 - 0.2 % Final   Performed at [facility], [address] [place], [place] [address]   Magnesium 04/02/2023 2.0  1.7 - 2.4 mg/dL Final   Performed at [facility], [address], [place], [place] [address]   Phosphorus 04/02/2023 2.4 (L)  2.5 - 4.6 mg/dL Final   Performed at [facility], [address], [place], [place] [address]   Alcohol, Ethyl (B) 04/02/2023 <10  <10 mg/dL Final  Comment: (NOTE) Lowest detectable limit for serum alcohol is 10 mg/dL.  For medical purposes only. Performed at [facility], [address] [place], [place] [address]    Total Protein 04/02/2023 6.7  6.5 - 8.1 g/dL Final   Albumin 84/13/2440 3.9  3.5 - 5.0 g/dL Final   AST 12/10/2534 213 (H)  15 - 41 U/L Final   ALT 04/02/2023 104 (H)  0 - 44 U/L Final   Alkaline Phosphatase 04/02/2023 86  38 - 126 U/L Final   Total Bilirubin 04/02/2023 2.0 (H)  0.0 - 1.2 mg/dL Final   Bilirubin, Direct 04/02/2023 0.4 (H)  0.0 - 0.2 mg/dL Final   Indirect Bilirubin 04/02/2023 1.6 (H)  0.3 - 0.9 mg/dL Final   Performed at [facility], [address], [place], [place] [address]   HEP B CORE AB 04/02/2023 Negative  Negative Final   Comment:  (NOTE) Performed At: [facility] [address] [place], [place] [id number] Jolene Schimke MD OV:[id number]    Hep B S Ab 04/02/2023 NON REACTIVE  NON REACTIVE Final   Comment: (NOTE) Inconsistent with immunity, less than 10 mIU/mL.  Performed at [facility], [address] [place], [place] [address]    Hepatitis B Surface Ag 04/02/2023 NON REACTIVE  NON REACTIVE Final   Performed at [facility], [address], [place], [place] [address]   HCV Ab 04/02/2023 NON REACTIVE  NON REACTIVE Final   Comment: (NOTE) Nonreactive HCV antibody screen is consistent with no HCV infections,  unless recent infection is suspected or other evidence exists to indicate HCV infection.  Performed at [facility], [address] [place], [place] [address]   Office Visit on 11/23/2022  Component Date Value Ref Range Status   Amphetamines, IA 11/23/2022 Negative  Cutoff:50 ng/mL Final   Barbiturates, IA 11/23/2022 Negative  Cutoff:0.1 ug/mL Final   Benzodiazepines, IA 11/23/2022 Negative  Cutoff:20 ng/mL Final   Cocaine & Metabolite, IA 11/23/2022 Negative  Cutoff:25 ng/mL Final   Phencyclidine, IA 11/23/2022 Negative  Cutoff:8 ng/mL Final   THC(Marijuana) Metabolite, IA 11/23/2022 Negative  Cutoff:5 ng/mL Final   Opiates, IA 11/23/2022 Negative  Cutoff:5 ng/mL Final   Oxycodones, IA 11/23/2022 Negative  Cutoff:5 ng/mL Final   Methadone, IA 11/23/2022 Negative  Cutoff:25 ng/mL Final   Propoxyphene, IA 11/23/2022 Negative  Cutoff:50 ng/mL Final   Comment: This test was developed and its performance characteristics determined by [facility].  It has not been cleared or approved by the Food and Drug Administration.   Lab on 11/09/2022  Component Date Value Ref Range Status   Glucose 11/09/2022 86  70 - 99 mg/dL Final   BUN 32/35/5732 13  6 - 24 mg/dL Final   Creatinine, Ser 11/09/2022 1.42 (H)  0.76 - 1.27 mg/dL Final   eGFR 20/25/4270 62  >59 mL/min/1.73 Final    BUN/Creatinine Ratio 11/09/2022 9  9 - 20 Final   Sodium 11/09/2022 135  134 - 144 mmol/L Final   Potassium 11/09/2022 4.3  3.5 - 5.2 mmol/L Final   Chloride 11/09/2022 101  96 - 106 mmol/L Final   CO2 11/09/2022 22  20 - 29 mmol/L Final   Calcium 11/09/2022 10.4 (H)  8.7 - 10.2 mg/dL Final   Total Protein 62/37/6283 6.9  6.0 - 8.5 g/dL Final   Albumin 15/17/6160 4.3  4.1 - 5.1 g/dL Final   Globulin, Total 11/09/2022 2.6  1.5 - 4.5 g/dL Final  Bilirubin Total 11/09/2022 1.2  0.0 - 1.2 mg/dL Final   Alkaline Phosphatase 11/09/2022 93  44 - 121 IU/L Final   AST 11/09/2022 25  0 - 40 IU/L Final   ALT 11/09/2022 21  0 - 44 IU/L Final   Ferritin 11/09/2022 128  30 - 400 ng/mL Final   Vit D, 25-Hydroxy 11/09/2022 23.7 (L)  30.0 - 100.0 ng/mL Final   Comment: Vitamin D deficiency has been defined by the Institute of Medicine and an Endocrine Society practice guideline as a level of serum 25-OH vitamin D less than 20 ng/mL (1,2). The Endocrine Society went on to further define vitamin D insufficiency as a level between 21 and 29 ng/mL (2). 1. IOM (Institute of Medicine). 2010. Dietary reference    intakes for calcium and D. [place]: The    [employer]. 2. Holick MF, Binkley Hulett, Bischoff-Ferrari HA, et al.    Evaluation, treatment, and prevention of vitamin D    deficiency: an Endocrine Society clinical practice    guideline. JCEM. 2011 Jul; 96(7):1911-30.    WBC 11/09/2022 13.0 (H)  3.4 - 10.8 x10E3/uL Final   RBC 11/09/2022 4.68  4.14 - 5.80 x10E6/uL Final   Hemoglobin 11/09/2022 14.4  13.0 - 17.7 g/dL Final   Hematocrit 02/15/7251 41.0  37.5 - 51.0 % Final   MCV 11/09/2022 88  79 - 97 fL Final   MCH 11/09/2022 30.8  26.6 - 33.0 pg Final   MCHC 11/09/2022 35.1  31.5 - 35.7 g/dL Final   RDW 66/44/0347 15.7 (H)  11.6 - 15.4 % Final   Platelets 11/09/2022 303  150 - 450 x10E3/uL Final   Neutrophils 11/09/2022 52  Not Estab. % Final   Lymphs 11/09/2022 32  Not Estab. %  Final   Monocytes 11/09/2022 12  Not Estab. % Final   Eos 11/09/2022 3  Not Estab. % Final   Basos 11/09/2022 1  Not Estab. % Final   Neutrophils Absolute 11/09/2022 6.7  1.4 - 7.0 x10E3/uL Final   Lymphocytes Absolute 11/09/2022 4.2 (H)  0.7 - 3.1 x10E3/uL Final   Monocytes Absolute 11/09/2022 1.6 (H)  0.1 - 0.9 x10E3/uL Final   EOS (ABSOLUTE) 11/09/2022 0.4  0.0 - 0.4 x10E3/uL Final   Basophils Absolute 11/09/2022 0.1  0.0 - 0.2 x10E3/uL Final   Immature Granulocytes 11/09/2022 0  Not Estab. % Final   Immature Grans (Abs) 11/09/2022 0.0  0.0 - 0.1 x10E3/uL Final   NRBC 11/09/2022 1 (H)  0 - 0 % Final   Retic Ct Pct 11/09/2022 3.6 (H)  0.6 - 2.6 % Final   Ethyl Alcohol, Enz 11/09/2022 Negative  Cutoff:0.020 gm/dL Final   Amphetamines, IA 11/09/2022 Negative  Cutoff:50 ng/mL Final   Barbiturates, [place] 11/09/2022 Negative  Cutoff:0.1 ug/mL Final   Benzodiazepines, [place] 11/09/2022 Negative  Cutoff:20 ng/mL Final   Cocaine & Metabolite, IA 11/09/2022 ++POSITIVE++ (A)  Cutoff:25 ng/mL Final   Phencyclidine, [place] 11/09/2022 Negative  Cutoff:8 ng/mL Final   THC(Marijuana) Metabolite, IA 11/09/2022 Negative  Cutoff:5 ng/mL Final   Opiates, IA 11/09/2022 Negative  Cutoff:5 ng/mL Final   Oxycodones, IA 11/09/2022 Negative  Cutoff:5 ng/mL Final   Methadone, IA 11/09/2022 Negative  Cutoff:25 ng/mL Final   Propoxyphene, [place] 11/09/2022 Negative  Cutoff:50 ng/mL Final   Comment: This test was developed and its performance characteristics determined by [facility].  It has not been cleared or approved by the Food and Drug Administration.    Cocaine Confirmation 11/09/2022 Positive  Final   Cocaine 11/09/2022 Negative  ng/mL Final   Benzoylecgonine 11/09/2022 22  ng/mL Final   Confirmation threshold: 10 ng/mL    Blood Alcohol level:  Lab Results  Component Value Date   ETH <10 04/30/2023   ETH <10 04/02/2023    Metabolic Disorder Labs: No results found for: "HGBA1C", "MPG" No results found for:  "PROLACTIN" Lab Results  Component Value Date   CHOL  11/06/2008    84        ATP III CLASSIFICATION:  <200     mg/dL   Desirable  782-956  mg/dL   Borderline High  >=213    mg/dL   High          TRIG 44 11/06/2008   HDL 31 (L) 11/06/2008   CHOLHDL 2.7 11/06/2008   VLDL 9 11/06/2008   LDLCALC  11/06/2008    44        Total Cholesterol/HDL:CHD Risk Coronary Heart Disease Risk Table                     Men   Women  1/2 Average Risk   3.4   3.3  Average Risk       5.0   4.4  2 X Average Risk   9.6   7.1  3 X Average Risk  23.4   11.0        Use the calculated Patient Ratio above and the CHD Risk Table to determine the patient's CHD Risk.        ATP III CLASSIFICATION (LDL):  <100     mg/dL   Optimal  086-578  mg/dL   Near or Above                    Optimal  130-159  mg/dL   Borderline  469-629  mg/dL   High  >528     mg/dL   Very High    Therapeutic Lab Levels: No results found for: "LITHIUM" No results found for: "VALPROATE" No results found for: "CBMZ"  Physical Findings   PHQ2-9    Flowsheet Row ED from 04/30/2023 in [facility] Office Visit from 10/25/2022 in [facility] Of [facility] Office Visit from 07/17/2022 in [facility] Of [facility] Office Visit from 04/17/2022 in [facility] - [facility] Of [facility]igh[facility] Bride[facility] Office Visit from 01/12/2022 in [facility]  PHQ-2 Total Score 0 0 0 0 0  PHQ-9 Total Score 2 0 -- -- --      Flowsheet Row ED from 04/30/2023 in [facility] ED to Hosp-Admission (Discharged) from 04/01/2023 in [place]  [facility] ED to Hosp-Admission (Discharged) from 06/27/2022 in [facility]  C-SSRS RISK CATEGORY No Risk No Risk No Risk        Musculoskeletal   Strength & Muscle Tone: within normal limits Gait & Station: normal Patient leans: N/A  Psychiatric Specialty Exam  Presentation  General Appearance:  Casual  Eye Contact: Fleeting  Speech: Clear and Coherent  Speech Volume: Normal  Handedness: Right   Mood and Affect  Mood: Anxious  Affect: Congruent   Thought Process  Thought Processes: Coherent  Descriptions of Associations:Intact  Orientation:Full (Time, Place  and Person)  Thought Content:Logical  Diagnosis of Schizophrenia or Schizoaffective disorder in past: No    Hallucinations:No data recorded  Ideas of Reference:None  Suicidal Thoughts:No data recorded  Homicidal Thoughts:No data recorded   Sensorium  Memory: Immediate Fair  Judgment: Poor  Insight: Fair   Chartered certified accountant: Fair  Attention Span: Fair  Recall: [employer] of Knowledge:No data recorded Language: Good   Psychomotor Activity  Psychomotor Activity: No data recorded   Assets  Assets: Desire for Improvement; Housing; Resilience; Vocational/Educational   Sleep  Sleep: No data recorded   No data recorded   Physical Exam  Physical Exam Vitals and nursing note reviewed.  Constitutional:      Appearance: Normal appearance.  HENT:     Head: Normocephalic.  Neurological:     General: No focal deficit present.     Mental Status: He is alert and oriented to person, place, and time. Mental status is at baseline.  Psychiatric:        Mood and Affect: Mood normal.        Behavior: Behavior normal.    Review of Systems  Psychiatric/Behavioral:  Negative for depression and substance abuse. The patient does not have insomnia.   All other systems reviewed and are negative.  Blood pressure 136/84, pulse 79, temperature 98.1 F (36.7 C), temperature source Oral, resp. rate 20, SpO2 98%. There is no height or weight on file to calculate BMI.  Treatment Plan Summary: Daily contact  with patient to assess and evaluate symptoms and progress in treatment, Medication management, and Plan the patient is admitted to the inpatient program at [facility]. Marland Kitchen  Short-term goals are to verbalize his feelings in meetings with the treatment team and attend at least 50% of groups daily.  PHQ-9 on day 3 was equal to 2.  He will take medications as prescribed daily if indicated.  He will consider a long-term rehab facility and he will be provided information so he can contact them while at the [facility].  He is strongly encouraged to contact the social worker and discuss.  He is also encouraged to contact the facilities offering rehab ASAP.  The plan is for [facility] tomorrow either to [facility] or home by Monday. Prognosis is fair to guarded.  Rex Kras, MD 05/04/2023 10:25 AM

## 2023-05-04 NOTE — ED Notes (Signed)
 Pt currently in group.

## 2023-05-04 NOTE — ED Notes (Signed)
 Pt in the bedroom with eyes closed,composed and sleeping. NAD, respirations are even and unlabored.  Will continue to monitor for safety.

## 2023-05-04 NOTE — ED Notes (Signed)
 Pt A&Ox4, in NAD, ambulating w/ steady gait.

## 2023-05-04 NOTE — ED Notes (Signed)
 Pt sitting in dayroom watching television. No acute distress noted. No concerns voiced. Informed pt to notify staff with any needs or assistance. Pt verbalized understanding and agreement. Will continue to monitor for safety.

## 2023-05-04 NOTE — ED Notes (Signed)
 Pt in dayroom watching TV w/ other pts.

## 2023-05-04 NOTE — Group Note (Signed)
 Group Topic: Spirituality in Recovery  Group Date: 05/04/2023 Start Time: 0230 End Time: 0330 Facilitators: Loleta Dicker, LCSW  Department: [facility]  Number of Participants: 5  Group Focus: check in, clarity of thought, relapse prevention, and self-awareness Treatment Modality:  Behavior Modification Therapy, Cognitive Behavioral Therapy, Solution-Focused Therapy, and Spiritual Interventions utilized were clarification, exploration, group exercise, and problem solving Purpose: enhance coping skills, explore maladaptive thinking, and express feelings  Name: Dean Webb Date of Birth: [birth year]  MR: [id number]    Level of Participation: active Quality of Participation: attentive, cooperative, and initiates communication Interactions with others: gave feedback Mood/Affect: appropriate Triggers (if applicable): They way others have looked down upon him Cognition: coherent/clear and insightful Progress: Gaining insight Description of Group:   This group will address the importance of considering the journey and not just the destination. Patients will be encouraged to process areas in their lives where they found it hard to focus on the good rather than the bad, and identify reasons for maintaining such thought pattern. Facilitator will guide patients utilizing problem- solving interventions to address and improve the way each patient views themselves and their situation. Patients will work through understanding and applying humility when it comes to life changes and the interactions we have with others. Patients will be encouraged to explore ways that they will move forward throughout life's journey, and make healthier decisions for themselves.    Therapeutic Goals: Patient will identify two or more emotions or situations that they observed or felt throughout watching the video clip. Patient will identify signs where they may have responded to someone or  situation due to their current circumstance.  Patient will identify two ways to set better habits in order to achieve more peace in their lives. Patient will demonstrate ability to communicate their needs through discussion and/or role plays.   Summary of Patient Progress: Patient actively participated in group on today. Patient reports that the video clip definitely made him reflect on the way he has been treated in his life and how he has persevered even through persecution. Patient reports being grateful that he chose to seek help in this time because he believes he is right where he needs to be. Patient reports feeling encouraged from watching the video, and he was able to provide feedback to staff and peers.  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

## 2023-05-04 NOTE — Discharge Planning (Signed)
 LCSW received an update from [facility] regarding patient referral.  Per Marcelino Duster in admissions, patient has been declined from their facility due to having sickle cell.  Patient was informed of this information, and provided the contact number for [facility] in [place].  Patient has been advised to call [facility] to complete phone screening for possible admission.  LCSW will follow up with the patient at a later time once a day.  Fernande Boyden, LCSW Clinical Social Worker [facility] Ph: [phone]

## 2023-05-04 NOTE — ED Notes (Signed)
 Patient is in the bed comfortably sleeping. NAD. Will monitor for safety.

## 2023-05-04 NOTE — Group Note (Signed)
 Group Topic: Identity and Relationships  Group Date: 05/04/2023 Start Time: 2015 End Time: 2045 Facilitators: Darin Engels  Department: [facility]  Number of Participants: 4  Group Focus: acceptance, communication, coping skills, feeling awareness/expression, forgiveness, reminiscence, and self-awareness Treatment Modality:  Leisure Development and Psychoeducation Interventions utilized were group exercise, problem solving, reminiscence, story telling, and support Purpose: enhance coping skills, express feelings, improve communication skills, increase insight, and regain self-worth  Name: Dean Webb Date of Birth: April 24, 1975  MR: [id number]    Level of Participation: active Quality of Participation: attentive, cooperative, engaged, offered feedback, and supportive Interactions with others: gave feedback Mood/Affect: appropriate, bright, and positive Triggers (if applicable): n/a Cognition: coherent/clear Progress: Gaining insight Response: pt interacted well with others while offering support and sharing his experience.  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

## 2023-05-04 NOTE — ED Notes (Signed)
 Pt A&Ox4, ambulatory w/ steady gait, VSS, calm and cooperative, denies SI/HI/AVH.

## 2023-05-04 NOTE — Group Note (Signed)
 Group Topic: Recovery Basics  Group Date: 05/04/2023 Start Time: 1645 End Time: 1715 Facilitators: Prentice Docker, RN  Department: [facility]  Number of Participants: 8  Group Focus: coping skills Treatment Modality:  Solution-Focused Therapy Interventions utilized were exploration Purpose: enhance coping skills  Name: Dean Webb Date of Birth: 11/27/1975  MR: [id number]    Level of Participation: active Quality of Participation: attentive Interactions with others: gave feedback Mood/Affect: appropriate Triggers (if applicable): none identified Cognition: coherent/clear Progress: Gaining insight Response: "when I feel anxious, I usually just go for a walk, watch a movie or talk to someone" Plan: patient will be encouraged to utilize learned coping skills for increased anxiety and stress  Patients Problems:  Patient Active Problem List   Diagnosis Date Noted   Substance abuse (HCC) 04/30/2023   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

## 2023-05-04 NOTE — ED Notes (Signed)
 Pt is in the dayroom watching TV with peers. Pt denies SI/HI/AVH. Pt has no further complain.No acute distress noted. Will continue to monitor for safety and provide support.

## 2023-05-05 DIAGNOSIS — F419 Anxiety disorder, unspecified: Secondary | ICD-10-CM | POA: Diagnosis not present

## 2023-05-05 DIAGNOSIS — F1410 Cocaine abuse, uncomplicated: Secondary | ICD-10-CM | POA: Diagnosis not present

## 2023-05-05 DIAGNOSIS — Z5900 Homelessness unspecified: Secondary | ICD-10-CM | POA: Diagnosis not present

## 2023-05-05 DIAGNOSIS — F339 Major depressive disorder, recurrent, unspecified: Secondary | ICD-10-CM | POA: Diagnosis not present

## 2023-05-05 NOTE — ED Notes (Signed)
 Patient is sleeping. Respirations equal and unlabored, skin warm and dry. No change in assessment or acuity. Routine safety checks conducted according to facility protocol. Will continue to monitor for safety.

## 2023-05-05 NOTE — ED Notes (Signed)
 Pt in dayroom watching tv with peers. Pleasant and engaged. Denies SI/HI/AVH. Voices c/o mild cravings. No noted distress. Will continue to monitor for safety

## 2023-05-05 NOTE — ED Notes (Signed)
 Pt sleeping at present, no distress noted, monitoring for safety.

## 2023-05-05 NOTE — Group Note (Signed)
 Group Topic: Change and Accountability  Group Date: 05/05/2023 Start Time: 1145 End Time: 1215 Facilitators: Elenor Quinones, NT  Department: [facility]  Number of Participants: 7  Group Focus: affirmation Treatment Modality:  Psychoeducation Interventions utilized were small goals for today Purpose: increase insight  Name: Dean Webb Date of Birth: 1975/04/12  MR: [id number]    Level of Participation: active Quality of Participation: attentive Interactions with others: gave feedback Mood/Affect: appropriate Triggers (if applicable): n/a Cognition: goal directed Progress: Significant Response:  Pt's goal today is to get better for his family. 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

## 2023-05-05 NOTE — ED Provider Notes (Signed)
 Behavioral Health Progress Note  Date and Time: 05/05/2023 1:11 PM Name: ROLAN WRIGHTSMAN MRN:  [id number]  HPI:  Dean Webb 48 y/o male with a history of polysubstance use, homelessness.  Presented to [facility] voluntarily.  Per the patient he is trying to get some help with his cocaine abuse.  According to the patient he has tried outpatient services before but he relapsed a couple months ago patient also reports he is currently homeless.  Patient is also unemployed.  Denies seeing a therapist or a psychiatrist at this time.   Subjective: The patient denies auditory/visual hallucinations.  The patient reports good mood, appetite, and sleep. They deny suicidal and homicidal thoughts. The patient denies side effects from their medications.  Review of systems as below. The patient denies experiencing any withdrawal symptoms.   I discussed the patient's discharge date of Monday 3/24.  The patient expressed his understanding.  He has been denied from [facility], so I recommended that he call the number for [facility] or get [facility] numbers from the nurse.   Diagnosis:  Cocaine use disorder Major depression, recurrent, remission status unspecified.  Final diagnoses:  Cocaine abuse (HCC)  Homelessness unspecified  Anxious appearance  Recurrent major depressive disorder, remission status unspecified (HCC)    Total Time spent with patient: 30 minutes  Past Psychiatric History: Patient denies any prior history of psychiatric admissions or treatments. Past Medical History: Records indicate history of sickle cell anemia, GERD, asthma, hypertension, OSA and rhabdomyolysis in the past. Family History: Unknown at this time Family Psychiatric  History: Unknown at this time. Social History: Patient reports that he is in a complicated relationship but would not provide information about his living arrangement.  He is homeless.  He lives in [place].  He does have family and has 4 children.  He is  currently unemployed.  Additional Social History:    Pain Medications: See MAR Prescriptions: See MAR Over the Counter: See MAR History of alcohol / drug use?: Yes Longest period of sobriety (when/how long): 2-3 weeks Negative Consequences of Use: Financial, Personal relationships Withdrawal Symptoms: None Name of Substance 1: cocaine 1 - Age of First Use: 46 1 - Amount (size/oz): 4 grams 1 - Frequency: daily 1 - Last Use / Amount: within last 24 hours                  Sleep: Fair  Appetite:  Good  Current Medications:  Current Facility-Administered Medications  Medication Dose Route Frequency Provider Last Rate Last Admin   acetaminophen (TYLENOL) tablet 650 mg  650 mg Oral Q6H PRN Sindy Guadeloupe, NP       alum & mag hydroxide-simeth (MAALOX/MYLANTA) 200-200-20 MG/5ML suspension 30 mL  30 mL Oral Q4H PRN Sindy Guadeloupe, NP       cloNIDine (CATAPRES) tablet 0.1 mg  0.1 mg Oral QAC breakfast Sindy Guadeloupe, NP   0.1 mg at 05/05/23 0957   dicyclomine (BENTYL) tablet 20 mg  20 mg Oral Q6H PRN Sindy Guadeloupe, NP       hydrOXYzine (ATARAX) tablet 25 mg  25 mg Oral Q6H PRN Sindy Guadeloupe, NP   25 mg at 05/04/23 2109   loperamide (IMODIUM) capsule 2-4 mg  2-4 mg Oral PRN Sindy Guadeloupe, NP       magnesium hydroxide (MILK OF MAGNESIA) suspension 30 mL  30 mL Oral Daily PRN Sindy Guadeloupe, NP       methocarbamol (ROBAXIN) tablet 500 mg  500 mg Oral Q8H PRN Sindy Guadeloupe,  NP       naproxen (NAPROSYN) tablet 500 mg  500 mg Oral BID PRN Sindy Guadeloupe, NP   500 mg at 05/03/23 2132   OLANZapine (ZYPREXA) injection 10 mg  10 mg Intramuscular TID PRN Sindy Guadeloupe, NP       OLANZapine (ZYPREXA) injection 5 mg  5 mg Intramuscular TID PRN Sindy Guadeloupe, NP       OLANZapine zydis (ZYPREXA) disintegrating tablet 5 mg  5 mg Oral TID PRN Sindy Guadeloupe, NP       ondansetron (ZOFRAN-ODT) disintegrating tablet 4 mg  4 mg Oral Q6H PRN Sindy Guadeloupe, NP       Current Outpatient Medications  Medication  Sig Dispense Refill   albuterol (VENTOLIN HFA) 108 (90 Base) MCG/ACT inhaler TAKE 2 PUFFS BY MOUTH EVERY 6 HOURS AS NEEDED FOR WHEEZE OR SHORTNESS OF BREATH 18 each 1   amLODipine (NORVASC) 10 MG tablet Take 1 tablet (10 mg total) by mouth daily. 90 tablet 0   cetirizine (ZYRTEC) 10 MG tablet TAKE 1 TABLET BY MOUTH EVERY DAY AS NEEDED FOR ALLERGY 90 tablet 1   cloNIDine (CATAPRES) 0.1 MG tablet TAKE 1 TABLET BY MOUTH 2 TIMES DAILY. (Patient taking differently: Take 0.3 mg by mouth daily.) 60 tablet 2   DULoxetine (CYMBALTA) 30 MG capsule Take 1 capsule (30 mg total) by mouth daily. 90 capsule 1   gabapentin (NEURONTIN) 300 MG capsule Take 1 capsule (300 mg total) by mouth 2 (two) times daily. 90 capsule 3   losartan-hydrochlorothiazide (HYZAAR) 100-25 MG tablet Take 1 tablet by mouth daily. 90 tablet 0   metoprolol succinate (TOPROL-XL) 50 MG 24 hr tablet Take 1 tablet (50 mg total) by mouth daily. Take with or immediately following a meal. 90 tablet 3   montelukast (SINGULAIR) 10 MG tablet TAKE 1 TABLET BY MOUTH EVERYDAY AT BEDTIME 90 tablet 1   nicotine (NICODERM CQ - DOSED IN MG/24 HOURS) 21 mg/24hr patch Place 1 patch (21 mg total) onto the skin daily. 28 patch 0   oxyCODONE (ROXICODONE) 15 MG immediate release tablet Take 1 tablet (15 mg total) by mouth every 8 (eight) hours as needed for pain. 8 tablet 0   pantoprazole (PROTONIX) 40 MG tablet Take 1 tablet (40 mg total) by mouth daily. 30 tablet 3   tiZANidine (ZANAFLEX) 4 MG tablet Take 1 tablet (4 mg total) by mouth every 6 (six) hours as needed for muscle spasms. 30 tablet 0   benzonatate (TESSALON) 100 MG capsule Take 1 capsule (100 mg total) by mouth 3 (three) times daily as needed for cough. (Patient not taking: Reported on 05/01/2023) 21 capsule 0    Labs  Lab Results:  Admission on 04/30/2023  Component Date Value Ref Range Status   WBC 04/30/2023 13.9 (H)  4.0 - 10.5 K/uL Final   RBC 04/30/2023 4.93  4.22 - 5.81 MIL/uL Final    Hemoglobin 04/30/2023 14.9  13.0 - 17.0 g/dL Final   HCT 29/56/2130 38.2 (L)  39.0 - 52.0 % Final   MCV 04/30/2023 77.5 (L)  80.0 - 100.0 fL Final   MCH 04/30/2023 30.2  26.0 - 34.0 pg Final   MCHC 04/30/2023 37.5 (H)  30.0 - 36.0 g/dL Final   REPEATED TO VERIFY   RDW 04/30/2023 15.1  11.5 - 15.5 % Final   Platelets 04/30/2023 314  150 - 400 K/uL Final   nRBC 04/30/2023 1.0 (H)  0.0 - 0.2 % Final   Neutrophils Relative % 04/30/2023 54  %  Final   Neutro Abs 04/30/2023 7.7  1.7 - 7.7 K/uL Final   Lymphocytes Relative 04/30/2023 26  % Final   Lymphs Abs 04/30/2023 3.6  0.7 - 4.0 K/uL Final   Monocytes Relative 04/30/2023 13  % Final   Monocytes Absolute 04/30/2023 1.8 (H)  0.1 - 1.0 K/uL Final   Eosinophils Relative 04/30/2023 5  % Final   Eosinophils Absolute 04/30/2023 0.7 (H)  0.0 - 0.5 K/uL Final   Basophils Relative 04/30/2023 1  % Final   Basophils Absolute 04/30/2023 0.1  0.0 - 0.1 K/uL Final   Immature Granulocytes 04/30/2023 1  % Final   Abs Immature Granulocytes 04/30/2023 0.07  0.00 - 0.07 K/uL Final   Pappenheimer Bodies 04/30/2023 PRESENT   Final   Polychromasia 04/30/2023 PRESENT   Final   Performed at [facility], [address], [place], [place] [address]   Sodium 04/30/2023 132 (L)  135 - 145 mmol/L Final   Potassium 04/30/2023 4.4  3.5 - 5.1 mmol/L Final   Chloride 04/30/2023 96 (L)  98 - 111 mmol/L Final   CO2 04/30/2023 27  22 - 32 mmol/L Final   Glucose, Bld 04/30/2023 77  70 - 99 mg/dL Final   Glucose reference range applies only to samples taken after fasting for at least 8 hours.   BUN 04/30/2023 8  6 - 20 mg/dL Final   Creatinine, Ser 04/30/2023 1.15  0.61 - 1.24 mg/dL Final   Calcium 78/46/9629 11.0 (H)  8.9 - 10.3 mg/dL Final   Total Protein 52/84/1324 7.2  6.5 - 8.1 g/dL Final   Albumin 40/11/2723 4.2  3.5 - 5.0 g/dL Final   AST 36/64/4034 29  15 - 41 U/L Final   ALT 04/30/2023 23  0 - 44 U/L Final   Alkaline Phosphatase 04/30/2023 99  38 - 126 U/L  Final   Total Bilirubin 04/30/2023 3.2 (H)  0.0 - 1.2 mg/dL Final   GFR, Estimated 04/30/2023 >60  >60 mL/min Final   Comment: (NOTE) Calculated using the CKD-EPI Creatinine Equation (2021)    Anion gap 04/30/2023 9  5 - 15 Final   Performed at [facility], [address], [place], [place] [address]   Alcohol, Ethyl (B) 04/30/2023 <10  <10 mg/dL Final   Comment: (NOTE) Lowest detectable limit for serum alcohol is 10 mg/dL.  For medical purposes only. Performed at [facility], [address] [place], [place] [address]    TSH 04/30/2023 3.540  0.350 - 4.500 uIU/mL Final   Comment: Performed by a 3rd Generation assay with a functional sensitivity of <=0.01 uIU/mL. Performed at [facility], [address] [place], [place] [address]    POC Amphetamine UR 05/01/2023 None Detected  NONE DETECTED (Cut Off Level 1000 ng/mL) Final   POC Secobarbital (BAR) 05/01/2023 None Detected  NONE DETECTED (Cut Off Level 300 ng/mL) Final   POC Buprenorphine (BUP) 05/01/2023 None Detected  NONE DETECTED (Cut Off Level 10 ng/mL) Final   POC Oxazepam (BZO) 05/01/2023 None Detected  NONE DETECTED (Cut Off Level 300 ng/mL) Final   POC Cocaine UR 05/01/2023 Positive (A)  NONE DETECTED (Cut Off Level 300 ng/mL) Final   POC Methamphetamine UR 05/01/2023 None Detected  NONE DETECTED (Cut Off Level 1000 ng/mL) Final   POC Morphine 05/01/2023 None Detected  NONE DETECTED (Cut Off Level 300 ng/mL) Final   POC Methadone UR 05/01/2023 None Detected  NONE DETECTED (Cut Off Level 300 ng/mL)  Final   POC Oxycodone UR 05/01/2023 None Detected  NONE DETECTED (Cut Off Level 100 ng/mL) Final   POC Marijuana UR 05/01/2023 None Detected  NONE DETECTED (Cut Off Level 50 ng/mL) Final  Admission on 04/01/2023, Discharged on 04/03/2023  Component Date Value Ref Range Status   Sodium 04/01/2023 134 (L)  135 - 145 mmol/L Final   Potassium 04/01/2023 3.5  3.5 - 5.1 mmol/L Final   Chloride 04/01/2023 102  98 -  111 mmol/L Final   CO2 04/01/2023 19 (L)  22 - 32 mmol/L Final   Glucose, Bld 04/01/2023 158 (H)  70 - 99 mg/dL Final   Glucose reference range applies only to samples taken after fasting for at least 8 hours.   BUN 04/01/2023 9  6 - 20 mg/dL Final   Creatinine, Ser 04/01/2023 1.26 (H)  0.61 - 1.24 mg/dL Final   Calcium 16/11/9602 10.9 (H)  8.9 - 10.3 mg/dL Final   Total Protein 54/10/8117 7.5  6.5 - 8.1 g/dL Final   Albumin 14/78/2956 4.1  3.5 - 5.0 g/dL Final   AST 21/30/8657 71 (H)  15 - 41 U/L Final   ALT 04/01/2023 47 (H)  0 - 44 U/L Final   Alkaline Phosphatase 04/01/2023 83  38 - 126 U/L Final   Total Bilirubin 04/01/2023 1.8 (H)  0.0 - 1.2 mg/dL Final   GFR, Estimated 04/01/2023 >60  >60 mL/min Final   Comment: (NOTE) Calculated using the CKD-EPI Creatinine Equation (2021)    Anion gap 04/01/2023 13  5 - 15 Final   Performed at [facility], [address] [place], [place] [address]   WBC 04/01/2023 8.8  4.0 - 10.5 K/uL Final   RBC 04/01/2023 4.97  4.22 - 5.81 MIL/uL Final   Hemoglobin 04/01/2023 14.8  13.0 - 17.0 g/dL Final   HCT 29/52/8413 40.7  39.0 - 52.0 % Final   MCV 04/01/2023 81.9  80.0 - 100.0 fL Final   MCH 04/01/2023 29.8  26.0 - 34.0 pg Final   MCHC 04/01/2023 36.4 (H)  30.0 - 36.0 g/dL Final   RDW 24/40/1027 15.2  11.5 - 15.5 % Final   Platelets 04/01/2023 301  150 - 400 K/uL Final   nRBC 04/01/2023 1.9 (H)  0.0 - 0.2 % Final   Neutrophils Relative % 04/01/2023 67  % Final   Neutro Abs 04/01/2023 5.9  1.7 - 7.7 K/uL Final   Lymphocytes Relative 04/01/2023 11  % Final   Lymphs Abs 04/01/2023 1.0  0.7 - 4.0 K/uL Final   Monocytes Relative 04/01/2023 20  % Final   Monocytes Absolute 04/01/2023 1.8 (H)  0.1 - 1.0 K/uL Final   Eosinophils Relative 04/01/2023 0  % Final   Eosinophils Absolute 04/01/2023 0.0  0.0 - 0.5 K/uL Final   Basophils Relative 04/01/2023 1  % Final   Basophils Absolute 04/01/2023 0.1  0.0 - 0.1 K/uL Final   Immature  Granulocytes 04/01/2023 1  % Final   Abs Immature Granulocytes 04/01/2023 0.05  0.00 - 0.07 K/uL Final   Performed at [facility], [address], [place], [place] [address]   Retic Ct Pct 04/01/2023 3.4 (H)  0.4 - 3.1 % Final   Comment: REPEATED TO VERIFY CONFIRMED BY MANUAL DILUTION    RBC. 04/01/2023 4.94  4.22 - 5.81 MIL/uL Final   Retic Count, Absolute 04/01/2023 170.0  19.0 - 186.0 K/uL Final   Immature Retic Fract 04/01/2023 13.3  2.3 - 15.9 %  Final   Performed at [facility], [address] [place], [place] [address]   SARS Coronavirus 2 by RT PCR 04/01/2023 POSITIVE (A)  NEGATIVE Final   Comment: (NOTE) SARS-CoV-2 target nucleic acids are DETECTED.  The SARS-CoV-2 RNA is generally detectable in upper respiratory specimens during the acute phase of infection. Positive results are indicative of the presence of the identified virus, but do not rule out bacterial infection or co-infection with other pathogens not detected by the test. Clinical correlation with patient history and other diagnostic information is necessary to determine patient infection status. The expected result is Negative.  Fact Sheet for Patients: BloggerCourse.com  Fact Sheet for Healthcare Providers: SeriousBroker.it  This test is not yet approved or cleared by the [place] FDA and  has been authorized for detection and/or diagnosis of SARS-CoV-2 by FDA under an Emergency Use Authorization (EUA).  This EUA will remain in effect (meaning this test can be used) for the duration of  the COVID-19 declaration under Section 564(b)(1) of the A                          ct, 21 U.S.C. section 360bbb-3(b)(1), unless the authorization is terminated or revoked sooner.     Influenza A by PCR 04/01/2023 NEGATIVE  NEGATIVE Final   Influenza B by PCR 04/01/2023 NEGATIVE  NEGATIVE Final   Comment: (NOTE) The Xpert Xpress  SARS-CoV-2/FLU/RSV plus assay is intended as an aid in the diagnosis of influenza from Nasopharyngeal swab specimens and should not be used as a sole basis for treatment. Nasal washings and aspirates are unacceptable for Xpert Xpress SARS-CoV-2/FLU/RSV testing.  Fact Sheet for Patients: BloggerCourse.com  Fact Sheet for Healthcare Providers: SeriousBroker.it  This test is not yet approved or cleared by the [place] FDA and has been authorized for detection and/or diagnosis of SARS-CoV-2 by FDA under an Emergency Use Authorization (EUA). This EUA will remain in effect (meaning this test can be used) for the duration of the COVID-19 declaration under Section 564(b)(1) of the Act, 21 U.S.C. section 360bbb-3(b)(1), unless the authorization is terminated or revoked.     Resp Syncytial Virus by PCR 04/01/2023 NEGATIVE  NEGATIVE Final   Comment: (NOTE) Fact Sheet for Patients: BloggerCourse.com  Fact Sheet for Healthcare Providers: SeriousBroker.it  This test is not yet approved or cleared by the [place] FDA and has been authorized for detection and/or diagnosis of SARS-CoV-2 by FDA under an Emergency Use Authorization (EUA). This EUA will remain in effect (meaning this test can be used) for the duration of the COVID-19 declaration under Section 564(b)(1) of the Act, 21 U.S.C. section 360bbb-3(b)(1), unless the authorization is terminated or revoked.  Performed at [facility], [address] [place], [place] [address]    Troponin I (High Sensitivity) 04/01/2023 12  <18 ng/L Final   Comment: (NOTE) Elevated high sensitivity troponin I (hsTnI) values and significant  changes across serial measurements may suggest ACS but many other  chronic and acute conditions are known to elevate hsTnI results.  Refer to the "Links" section for chest pain algorithms  and additional  guidance. Performed at [facility], [address], [place], [place] [address]    Opiates 04/01/2023 POSITIVE (A)  NONE DETECTED Final   Cocaine 04/01/2023 POSITIVE (A)  NONE DETECTED Final   Benzodiazepines 04/01/2023 NONE DETECTED  NONE DETECTED Final   Amphetamines 04/01/2023 NONE DETECTED  NONE DETECTED Final  Tetrahydrocannabinol 04/01/2023 NONE DETECTED  NONE DETECTED Final   Barbiturates 04/01/2023 NONE DETECTED  NONE DETECTED Final   Comment: (NOTE) DRUG SCREEN FOR MEDICAL PURPOSES ONLY.  IF CONFIRMATION IS NEEDED FOR ANY PURPOSE, NOTIFY LAB WITHIN 5 DAYS.  LOWEST DETECTABLE LIMITS FOR URINE DRUG SCREEN Drug Class                     Cutoff (ng/mL) Amphetamine and metabolites    1000 Barbiturate and metabolites    200 Benzodiazepine                 200 Opiates and metabolites        300 Cocaine and metabolites        300 THC                            50 Performed at [facility], [address] [place], [place] [address]    Sodium 04/02/2023 133 (L)  135 - 145 mmol/L Final   Potassium 04/02/2023 3.6  3.5 - 5.1 mmol/L Final   Chloride 04/02/2023 103  98 - 111 mmol/L Final   CO2 04/02/2023 21 (L)  22 - 32 mmol/L Final   Glucose, Bld 04/02/2023 132 (H)  70 - 99 mg/dL Final   Glucose reference range applies only to samples taken after fasting for at least 8 hours.   BUN 04/02/2023 7  6 - 20 mg/dL Final   Creatinine, Ser 04/02/2023 0.91  0.61 - 1.24 mg/dL Final   Calcium 29/51/8841 10.0  8.9 - 10.3 mg/dL Final   GFR, Estimated 04/02/2023 >60  >60 mL/min Final   Comment: (NOTE) Calculated using the CKD-EPI Creatinine Equation (2021)    Anion gap 04/02/2023 9  5 - 15 Final   Performed at [facility], [address] [place], [place] [address]   WBC 04/02/2023 9.2  4.0 - 10.5 K/uL Final   RBC 04/02/2023 4.71  4.22 - 5.81 MIL/uL Final   Hemoglobin 04/02/2023 14.0  13.0 - 17.0 g/dL Final   HCT  01/60/1093 38.0 (L)  39.0 - 52.0 % Final   MCV 04/02/2023 80.7  80.0 - 100.0 fL Final   MCH 04/02/2023 29.7  26.0 - 34.0 pg Final   MCHC 04/02/2023 36.8 (H)  30.0 - 36.0 g/dL Final   RDW 23/55/7322 14.6  11.5 - 15.5 % Final   Platelets 04/02/2023 247  150 - 400 K/uL Final   nRBC 04/02/2023 4.4 (H)  0.0 - 0.2 % Final   Performed at [facility], [address] [place], [place] [address]   Magnesium 04/02/2023 2.0  1.7 - 2.4 mg/dL Final   Performed at [facility], [address] [place], [place] [address]   Phosphorus 04/02/2023 2.4 (L)  2.5 - 4.6 mg/dL Final   Performed at [facility], [address] [place], [place] [address]   Alcohol, Ethyl (B) 04/02/2023 <10  <10 mg/dL Final   Comment: (NOTE) Lowest detectable limit for serum alcohol is 10 mg/dL.  For medical purposes only. Performed at [facility], [address], [place], [place] [address]    Total Protein 04/02/2023 6.7  6.5 - 8.1 g/dL Final   Albumin 16/08/3708 3.9  3.5 - 5.0 g/dL Final   AST 62/69/4854 213 (H)  15 - 41 U/L Final   ALT 04/02/2023 104 (H)  0 - 44 U/L Final  Alkaline Phosphatase 04/02/2023 86  38 - 126 U/L Final   Total Bilirubin 04/02/2023 2.0 (H)  0.0 - 1.2 mg/dL Final   Bilirubin, Direct 04/02/2023 0.4 (H)  0.0 - 0.2 mg/dL Final   Indirect Bilirubin 04/02/2023 1.6 (H)  0.3 - 0.9 mg/dL Final   Performed at [facility], [address], [place], [place] [address]   HEP B CORE AB 04/02/2023 Negative  Negative Final   Comment: (NOTE) Performed At: [facility] [address] [place], [place] [id number] Jolene Schimke MD [id number]    Hep B S Ab 04/02/2023 NON REACTIVE  NON REACTIVE Final   Comment: (NOTE) Inconsistent with immunity, less than 10 mIU/mL.  Performed at [facility], [address], [place], [place] [address]    Hepatitis B Surface Ag 04/02/2023 NON REACTIVE  NON REACTIVE  Final   Performed at [facility], [address], [place], [place] [address]   HCV Ab 04/02/2023 NON REACTIVE  NON REACTIVE Final   Comment: (NOTE) Nonreactive HCV antibody screen is consistent with no HCV infections,  unless recent infection is suspected or other evidence exists to indicate HCV infection.  Performed at [facility], [address], [place], [place] [address]   Office Visit on 11/23/2022  Component Date Value Ref Range Status   Amphetamines, IA 11/23/2022 Negative  Cutoff:50 ng/mL Final   Barbiturates, IA 11/23/2022 Negative  Cutoff:0.1 ug/mL Final   Benzodiazepines, IA 11/23/2022 Negative  Cutoff:20 ng/mL Final   Cocaine & Metabolite, IA 11/23/2022 Negative  Cutoff:25 ng/mL Final   Phencyclidine, IA 11/23/2022 Negative  Cutoff:8 ng/mL Final   THC(Marijuana) Metabolite, IA 11/23/2022 Negative  Cutoff:5 ng/mL Final   Opiates, IA 11/23/2022 Negative  Cutoff:5 ng/mL Final   Oxycodones, IA 11/23/2022 Negative  Cutoff:5 ng/mL Final   Methadone, IA 11/23/2022 Negative  Cutoff:25 ng/mL Final   Propoxyphene, IA 11/23/2022 Negative  Cutoff:50 ng/mL Final   Comment: This test was developed and its performance characteristics determined by [facility].  It has not been cleared or approved by the Food and Drug Administration.   Lab on 11/09/2022  Component Date Value Ref Range Status   Glucose 11/09/2022 86  70 - 99 mg/dL Final   BUN 84/13/2440 13  6 - 24 mg/dL Final   Creatinine, Ser 11/09/2022 1.42 (H)  0.76 - 1.27 mg/dL Final   eGFR 12/10/2534 62  >59 mL/min/1.73 Final   BUN/Creatinine Ratio 11/09/2022 9  9 - 20 Final   Sodium 11/09/2022 135  134 - 144 mmol/L Final   Potassium 11/09/2022 4.3  3.5 - 5.2 mmol/L Final   Chloride 11/09/2022 101  96 - 106 mmol/L Final   CO2 11/09/2022 22  20 - 29 mmol/L Final   Calcium 11/09/2022 10.4 (H)  8.7 - 10.2 mg/dL Final   Total Protein 64/40/3474 6.9  6.0 - 8.5 g/dL Final   Albumin 25/95/6387 4.3  4.1 - 5.1 g/dL Final    Globulin, Total 11/09/2022 2.6  1.5 - 4.5 g/dL Final   Bilirubin Total 11/09/2022 1.2  0.0 - 1.2 mg/dL Final   Alkaline Phosphatase 11/09/2022 93  44 - 121 IU/L Final   AST 11/09/2022 25  0 - 40 IU/L Final   ALT 11/09/2022 21  0 - 44 IU/L Final   Ferritin 11/09/2022 128  30 - 400 ng/mL Final   Vit D, 25-Hydroxy 11/09/2022 23.7 (L)  30.0 - 100.0 ng/mL Final   Comment: Vitamin D deficiency has been defined by  the Institute of Medicine and an Endocrine Society practice guideline as a level of serum 25-OH vitamin D less than 20 ng/mL (1,2). The Endocrine Society went on to further define vitamin D insufficiency as a level between 21 and 29 ng/mL (2). 1. IOM (Institute of Medicine). 2010. Dietary reference    intakes for calcium and D. [place]: The    [employer]. 2. Holick MF, [facility], Bischoff-Ferrari HA, et al.    Evaluation, treatment, and prevention of vitamin D    deficiency: an Endocrine Society clinical practice    guideline. JCEM. 2011 Jul; 96(7):1911-30.    WBC 11/09/2022 13.0 (H)  3.4 - 10.8 x10E3/uL Final   RBC 11/09/2022 4.68  4.14 - 5.80 x10E6/uL Final   Hemoglobin 11/09/2022 14.4  13.0 - 17.7 g/dL Final   Hematocrit 96/05/5407 41.0  37.5 - 51.0 % Final   MCV 11/09/2022 88  79 - 97 fL Final   MCH 11/09/2022 30.8  26.6 - 33.0 pg Final   MCHC 11/09/2022 35.1  31.5 - 35.7 g/dL Final   RDW 81/19/1478 15.7 (H)  11.6 - 15.4 % Final   Platelets 11/09/2022 303  150 - 450 x10E3/uL Final   Neutrophils 11/09/2022 52  Not Estab. % Final   Lymphs 11/09/2022 32  Not Estab. % Final   Monocytes 11/09/2022 12  Not Estab. % Final   Eos 11/09/2022 3  Not Estab. % Final   Basos 11/09/2022 1  Not Estab. % Final   Neutrophils Absolute 11/09/2022 6.7  1.4 - 7.0 x10E3/uL Final   Lymphocytes Absolute 11/09/2022 4.2 (H)  0.7 - 3.1 x10E3/uL Final   Monocytes Absolute 11/09/2022 1.6 (H)  0.1 - 0.9 x10E3/uL Final   EOS (ABSOLUTE) 11/09/2022 0.4  0.0 - 0.4 x10E3/uL Final    Basophils Absolute 11/09/2022 0.1  0.0 - 0.2 x10E3/uL Final   Immature Granulocytes 11/09/2022 0  Not Estab. % Final   Immature Grans (Abs) 11/09/2022 0.0  0.0 - 0.1 x10E3/uL Final   NRBC 11/09/2022 1 (H)  0 - 0 % Final   Retic Ct Pct 11/09/2022 3.6 (H)  0.6 - 2.6 % Final   Ethyl Alcohol, Enz 11/09/2022 Negative  Cutoff:0.020 gm/dL Final   Amphetamines, IA 11/09/2022 Negative  Cutoff:50 ng/mL Final   Barbiturates, IA 11/09/2022 Negative  Cutoff:0.1 ug/mL Final   Benzodiazepines, IA 11/09/2022 Negative  Cutoff:20 ng/mL Final   Cocaine & Metabolite, IA 11/09/2022 ++POSITIVE++ (A)  Cutoff:25 ng/mL Final   Phencyclidine, IA 11/09/2022 Negative  Cutoff:8 ng/mL Final   THC(Marijuana) Metabolite, IA 11/09/2022 Negative  Cutoff:5 ng/mL Final   Opiates, [place] 11/09/2022 Negative  Cutoff:5 ng/mL Final   Oxycodones, IA 11/09/2022 Negative  Cutoff:5 ng/mL Final   Methadone, IA 11/09/2022 Negative  Cutoff:25 ng/mL Final   Propoxyphene, IA 11/09/2022 Negative  Cutoff:50 ng/mL Final   Comment: This test was developed and its performance characteristics determined by [facility].  It has not been cleared or approved by the Food and Drug Administration.    Cocaine Confirmation 11/09/2022 Positive   Final   Cocaine 11/09/2022 Negative  ng/mL Final   Benzoylecgonine 11/09/2022 22  ng/mL Final   Confirmation threshold: 10 ng/mL    Blood Alcohol level:  Lab Results  Component Value Date   ETH <10 04/30/2023   ETH <10 04/02/2023    Metabolic Disorder Labs: No results found for: "HGBA1C", "MPG" No results found for: "PROLACTIN" Lab Results  Component Value Date   CHOL  11/06/2008    84  ATP III CLASSIFICATION:  <200     mg/dL   Desirable  366-440  mg/dL   Borderline High  >=347    mg/dL   High          TRIG 44 11/06/2008   HDL 31 (L) 11/06/2008   CHOLHDL 2.7 11/06/2008   VLDL 9 11/06/2008   LDLCALC  11/06/2008    44        Total Cholesterol/HDL:CHD Risk Coronary Heart Disease Risk Table                      Men   Women  1/2 Average Risk   3.4   3.3  Average Risk       5.0   4.4  2 X Average Risk   9.6   7.1  3 X Average Risk  23.4   11.0        Use the calculated Patient Ratio above and the CHD Risk Table to determine the patient's CHD Risk.        ATP III CLASSIFICATION (LDL):  <100     mg/dL   Optimal  425-956  mg/dL   Near or Above                    Optimal  130-159  mg/dL   Borderline  387-564  mg/dL   High  >332     mg/dL   Very High    Therapeutic Lab Levels: No results found for: "LITHIUM" No results found for: "VALPROATE" No results found for: "CBMZ"  Physical Findings   PHQ2-9    Flowsheet Row ED from 04/30/2023 in [facility] Office Visit from 10/25/2022 in [facility] Dept Of Eligha Bridegroom [facility] Office Visit from 07/17/2022 in [facility] Of Eligha Bride[facility] Office Visit from 04/17/2022 in [facility] Dept Of Eligha Bridegroom [facility] Office Visit from 01/12/2022 in [facility] Dept Of Eligha Bridegroom [facility]  PHQ-2 Total Score 0 0 0 0 0  PHQ-9 Total Score 2 0 -- -- --      Flowsheet Row ED from 04/30/2023 in [facility] ED to Hosp-Admission (Discharged) from 04/01/2023 in [facility] ED to Hosp-Admission (Discharged) from 06/27/2022 in [place] [facility]  C-SSRS RISK CATEGORY No Risk No Risk No Risk       Psychiatric Specialty Exam: Physical Exam Constitutional:      Appearance: the patient is not toxic-appearing.  Pulmonary:     Effort: Pulmonary effort is normal.  Neurological:     General: No focal deficit present.     Mental Status: the patient is alert and oriented to person, place, and time.   Review of Systems  Respiratory:  Negative for shortness of breath.   Cardiovascular:  Negative for chest pain.   Gastrointestinal:  Negative for abdominal pain, constipation, diarrhea, nausea and vomiting.  Neurological:  Negative for headaches.      BP 130/82   Pulse 75   Temp 97.6 F (36.4 C) (Oral)   Resp 19   SpO2 100%   General Appearance: Fairly Groomed  Eye Contact:  Good  Speech:  Clear and Coherent  Volume:  Normal  Mood:  Euthymic  Affect:  Congruent  Thought Process:  Coherent  Orientation:  Full (Time, Place, and Person)  Thought Content: Logical   Suicidal Thoughts:  No  Homicidal Thoughts:  No  Memory:  Immediate;   Good  Judgement:  fair  Insight:  fair  Psychomotor Activity:  Normal  Concentration:  Concentration: Good  Recall:  Good  Fund of Knowledge: Good  Language: Good  Akathisia:  No  Handed:  not assessed  AIMS (if indicated): not done  Assets:  Communication Skills Desire for Improvement Financial Resources/Insurance Housing Leisure Time Physical Health  ADL's:  Intact  Cognition: WNL  Sleep:  Fair     Treatment Plan Summary: Daily contact with patient to assess and evaluate symptoms and progress in treatment, Medication management, and Plan the patient is admitted to the inpatient program at [facility]. Marland Kitchen  Short-term goals are to verbalize his feelings in meetings with the treatment team and attend at least 50% of groups daily.  PHQ-9 on day 3 was equal to 2.  He will take medications as prescribed daily if indicated.  Continue clonidine as ordered  I discussed the patient's discharge date of Monday 3/24.  The patient expressed his understanding.  He has been denied from [facility], so I recommended that he call the number for [facility] or get [facility] numbers from the nurse.   Carlyn Reichert, MD 05/05/2023 1:11 PM

## 2023-05-05 NOTE — ED Notes (Signed)
 Patient alert & oriented x4. Denies intent to harm self or others when asked. Denies A/VH. Patient denies any physical complaints when asked. No acute distress noted. Scheduled medications administered with no complications. Support and encouragement provided. Routine safety checks conducted per facility protocol. Encouraged patient to notify staff if any thoughts of harm towards self or others arise. Patient verbalizes understanding and agreement.

## 2023-05-05 NOTE — ED Notes (Signed)
 Patient sitting in dayroom interacting with peers. Patient provided with lunch. No acute distress noted. No concerns voiced. Informed patient to notify staff with any needs or assistance. Patient verbalized understanding or agreement. Safety checks in place per facility policy.

## 2023-05-05 NOTE — ED Notes (Signed)
 Patient resting with eyes closed in no apparent acute distress. Respirations even and unlabored. Environment secured. Safety checks in place according to facility policy.

## 2023-05-05 NOTE — Group Note (Signed)
 Group Topic: Relapse and Recovery  Group Date: 05/05/2023 Start Time: 2000 End Time: 2100 Facilitators: Rae Lips B  Department: [facility]  Number of Participants: 3  Group Focus: abuse issues, acceptance, activities of daily living skills, and dual diagnosis Treatment Modality:  Leisure Development Interventions utilized were leisure development Purpose: express feelings  Name: Dean Webb Date of Birth: [birth year]  MR: [id number]    Level of Participation: active Quality of Participation: attentive and cooperative Interactions with others: gave feedback Mood/Affect: appropriate and positive Triggers (if applicable): NA Cognition: coherent/clear Progress: Moderate Response: NA Plan: follow-up needed  Patients Problems:  Patient Active Problem List   Diagnosis Date Noted   Substance abuse (HCC) 04/30/2023   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

## 2023-05-06 DIAGNOSIS — F1410 Cocaine abuse, uncomplicated: Secondary | ICD-10-CM | POA: Diagnosis not present

## 2023-05-06 DIAGNOSIS — Z5900 Homelessness unspecified: Secondary | ICD-10-CM | POA: Diagnosis not present

## 2023-05-06 DIAGNOSIS — F339 Major depressive disorder, recurrent, unspecified: Secondary | ICD-10-CM | POA: Diagnosis not present

## 2023-05-06 DIAGNOSIS — F419 Anxiety disorder, unspecified: Secondary | ICD-10-CM | POA: Diagnosis not present

## 2023-05-06 MED ORDER — NICOTINE 21 MG/24HR TD PT24
21.0000 mg | MEDICATED_PATCH | Freq: Every day | TRANSDERMAL | 0 refills | Status: AC
Start: 2023-05-06 — End: ?

## 2023-05-06 NOTE — Group Note (Signed)
 Group Topic: Recovery Basics  Group Date: 05/06/2023 Start Time: 1122 End Time: 1132 Facilitators: Ripley Bogosian, Jacklynn Barnacle, RN  Department: [facility]  Number of Participants: 1  Group Focus: discharge education Treatment Modality:  Individual Therapy Interventions utilized were patient education Purpose: increase insight  Name: Dean Webb Date of Birth: 11-16-1975  MR: [id number]    Level of Participation: active Quality of Participation: attentive and cooperative Interactions with others: gave feedback Mood/Affect: appropriate Triggers (if applicable): None identified Cognition: coherent/clear Progress: Significant Response: Patient voiced understanding Plan: patient will be encouraged to reach out for additional help if needed  Patients Problems:  Patient Active Problem List   Diagnosis Date Noted   Substance abuse (HCC) 04/30/2023   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

## 2023-05-06 NOTE — ED Provider Notes (Signed)
 FBC/OBS ASAP Discharge Summary  Date and Time: 05/06/2023 1:27 PM  Name: Dean Webb  MRN:  [id number]   Discharge Diagnoses:  Final diagnoses:  Cocaine abuse (HCC)  Homelessness unspecified  Anxious appearance  Recurrent major depressive disorder, remission status unspecified (HCC)    Subjective:  Dean Webb 48 y/o male with a history of polysubstance use, homelessness. Presented to [facility] voluntarily. Per the patient he is trying to get some help with his cocaine abuse. According to the patient he has tried outpatient services before but he relapsed a couple months ago patient also reports he is currently homeless. Patient is also unemployed. Denies seeing a therapist or a psychiatrist at this time.   Stay Summary:  Uneventful detox.  The patient stayed at the facility based crisis for an extended period of time but was unable to find a successful treatment option.  He was denied at both [facility] and [place].  The patient decided to return to live with his brother.  The patient denies auditory/visual hallucinations.  The patient reports good mood, appetite, and sleep. They deny suicidal and homicidal thoughts. The patient denies side effects from their medications.  Review of systems as below. The patient denies experiencing any withdrawal symptoms.     Total Time spent with patient: 20 minutes  Past Psychiatric History: as above Past Medical History: as above Family History: none Family Psychiatric History: none Social History: as above Tobacco Cessation:  A prescription for an FDA-approved tobacco cessation medication provided at discharge   Current Medications:  Current Facility-Administered Medications  Medication Dose Route Frequency Provider Last Rate Last Admin   acetaminophen (TYLENOL) tablet 650 mg  650 mg Oral Q6H PRN Sindy Guadeloupe, NP       alum & mag hydroxide-simeth (MAALOX/MYLANTA) 200-200-20 MG/5ML suspension 30 mL  30 mL Oral Q4H PRN Sindy Guadeloupe, NP        magnesium hydroxide (MILK OF MAGNESIA) suspension 30 mL  30 mL Oral Daily PRN Sindy Guadeloupe, NP       OLANZapine (ZYPREXA) injection 10 mg  10 mg Intramuscular TID PRN Sindy Guadeloupe, NP       OLANZapine (ZYPREXA) injection 5 mg  5 mg Intramuscular TID PRN Sindy Guadeloupe, NP       OLANZapine zydis (ZYPREXA) disintegrating tablet 5 mg  5 mg Oral TID PRN Sindy Guadeloupe, NP       Current Outpatient Medications  Medication Sig Dispense Refill   albuterol (VENTOLIN HFA) 108 (90 Base) MCG/ACT inhaler TAKE 2 PUFFS BY MOUTH EVERY 6 HOURS AS NEEDED FOR WHEEZE OR SHORTNESS OF BREATH 18 each 1   amLODipine (NORVASC) 10 MG tablet Take 1 tablet (10 mg total) by mouth daily. 90 tablet 0   cetirizine (ZYRTEC) 10 MG tablet TAKE 1 TABLET BY MOUTH EVERY DAY AS NEEDED FOR ALLERGY 90 tablet 1   cloNIDine (CATAPRES) 0.1 MG tablet TAKE 1 TABLET BY MOUTH 2 TIMES DAILY. (Patient taking differently: Take 0.3 mg by mouth daily.) 60 tablet 2   DULoxetine (CYMBALTA) 30 MG capsule Take 1 capsule (30 mg total) by mouth daily. 90 capsule 1   gabapentin (NEURONTIN) 300 MG capsule Take 1 capsule (300 mg total) by mouth 2 (two) times daily. 90 capsule 3   losartan-hydrochlorothiazide (HYZAAR) 100-25 MG tablet Take 1 tablet by mouth daily. 90 tablet 0   metoprolol succinate (TOPROL-XL) 50 MG 24 hr tablet Take 1 tablet (50 mg total) by mouth daily. Take with or immediately following a meal. 90 tablet 3  montelukast (SINGULAIR) 10 MG tablet TAKE 1 TABLET BY MOUTH EVERYDAY AT BEDTIME 90 tablet 1   pantoprazole (PROTONIX) 40 MG tablet Take 1 tablet (40 mg total) by mouth daily. 30 tablet 3   tiZANidine (ZANAFLEX) 4 MG tablet Take 1 tablet (4 mg total) by mouth every 6 (six) hours as needed for muscle spasms. 30 tablet 0   nicotine (NICODERM CQ - DOSED IN MG/24 HOURS) 21 mg/24hr patch Place 1 patch (21 mg total) onto the skin daily. 28 patch 0    PTA Medications:  PTA Medications  Medication Sig   amLODipine (NORVASC) 10 MG tablet  Take 1 tablet (10 mg total) by mouth daily.   pantoprazole (PROTONIX) 40 MG tablet Take 1 tablet (40 mg total) by mouth daily.   montelukast (SINGULAIR) 10 MG tablet TAKE 1 TABLET BY MOUTH EVERYDAY AT BEDTIME   metoprolol succinate (TOPROL-XL) 50 MG 24 hr tablet Take 1 tablet (50 mg total) by mouth daily. Take with or immediately following a meal.   cetirizine (ZYRTEC) 10 MG tablet TAKE 1 TABLET BY MOUTH EVERY DAY AS NEEDED FOR ALLERGY   cloNIDine (CATAPRES) 0.1 MG tablet TAKE 1 TABLET BY MOUTH 2 TIMES DAILY. (Patient taking differently: Take 0.3 mg by mouth daily.)   gabapentin (NEURONTIN) 300 MG capsule Take 1 capsule (300 mg total) by mouth 2 (two) times daily.   DULoxetine (CYMBALTA) 30 MG capsule Take 1 capsule (30 mg total) by mouth daily.   losartan-hydrochlorothiazide (HYZAAR) 100-25 MG tablet Take 1 tablet by mouth daily.   albuterol (VENTOLIN HFA) 108 (90 Base) MCG/ACT inhaler TAKE 2 PUFFS BY MOUTH EVERY 6 HOURS AS NEEDED FOR WHEEZE OR SHORTNESS OF BREATH   tiZANidine (ZANAFLEX) 4 MG tablet Take 1 tablet (4 mg total) by mouth every 6 (six) hours as needed for muscle spasms.   nicotine (NICODERM CQ - DOSED IN MG/24 HOURS) 21 mg/24hr patch Place 1 patch (21 mg total) onto the skin daily.   Facility Ordered Medications  Medication   acetaminophen (TYLENOL) tablet 650 mg   alum & mag hydroxide-simeth (MAALOX/MYLANTA) 200-200-20 MG/5ML suspension 30 mL   magnesium hydroxide (MILK OF MAGNESIA) suspension 30 mL   [EXPIRED] dicyclomine (BENTYL) tablet 20 mg   [EXPIRED] hydrOXYzine (ATARAX) tablet 25 mg   [EXPIRED] loperamide (IMODIUM) capsule 2-4 mg   [EXPIRED] methocarbamol (ROBAXIN) tablet 500 mg   [EXPIRED] naproxen (NAPROSYN) tablet 500 mg   [EXPIRED] ondansetron (ZOFRAN-ODT) disintegrating tablet 4 mg   OLANZapine zydis (ZYPREXA) disintegrating tablet 5 mg   OLANZapine (ZYPREXA) injection 5 mg   OLANZapine (ZYPREXA) injection 10 mg   [COMPLETED] cloNIDine (CATAPRES) tablet 0.1 mg    Followed by   [COMPLETED] cloNIDine (CATAPRES) tablet 0.1 mg   Followed by   [COMPLETED] cloNIDine (CATAPRES) tablet 0.1 mg       05/06/2023    1:27 PM 05/03/2023    8:44 AM 04/30/2023    8:01 PM  Depression screen PHQ 2/9  Decreased Interest 0 0 3  Down, Depressed, Hopeless 0 0   PHQ - 2 Score 0 0 3  Altered sleeping 0 1 3  Tired, decreased energy 0 1 3  Change in appetite 0 0 2  Feeling bad or failure about yourself  0 0 3  Trouble concentrating 0 0 3  Moving slowly or fidgety/restless 0 0 3  Suicidal thoughts 0 0 0  PHQ-9 Score 0 2 20  Difficult doing work/chores Not difficult at all      [facility] ED  from 04/30/2023 in [facility] ED to Hosp-Admission (Discharged) from 04/01/2023 in [place] [facility] ED to Hosp-Admission (Discharged) from 06/27/2022 in [place] [facility]  C-SSRS RISK CATEGORY No Risk No Risk No Risk      Musculoskeletal  Strength & Muscle Tone: within normal limits Gait & Station: normal Patient leans: N/A  Psychiatric Specialty Exam  Presentation General Appearance: Appropriate for Environment  Eye Contact:Fair  Speech:Clear and Coherent  Speech Volume:Normal  Handedness:-- (not assessed)   Mood and Affect  Mood:Euthymic  Affect:Congruent   Thought Process  Thought Processes:Coherent; Linear  Descriptions of Associations:Intact  Orientation:Full (Time, Place and Person)  Thought Content:Logical    Hallucinations:Hallucinations: None  Ideas of Reference:None  Suicidal Thoughts:Suicidal Thoughts: No  Homicidal Thoughts:Homicidal Thoughts: No   Sensorium  Memory:Immediate Fair; Recent Fair; Remote Fair  Judgment:Fair  Insight:Fair   Executive Functions  Concentration:Fair  Attention Span:Fair  Recall:Fair  Fund of Knowledge:Fair  Language:Fair   Psychomotor Activity  Psychomotor Activity:Psychomotor Activity: Normal   Assets   Assets:Communication Skills; Resilience   Sleep  Sleep:Sleep: Fair    Physical Exam Constitutional:      Appearance: the patient is not toxic-appearing.  Pulmonary:     Effort: Pulmonary effort is normal.  Neurological:     General: No focal deficit present.     Mental Status: the patient is alert and oriented to person, place, and time.   Review of Systems  Respiratory:  Negative for shortness of breath.   Cardiovascular:  Negative for chest pain.  Gastrointestinal:  Negative for abdominal pain, constipation, diarrhea, nausea and vomiting.  Neurological:  Negative for headaches.    BP (!) 127/92 (BP Location: Right Arm)   Pulse 68   Temp 97.6 F (36.4 C) (Oral)   Resp 17   SpO2 100%   Demographic Factors:  None pertinent   Loss Factors: NA   Historical Factors: NA   Risk Reduction Factors:   Positive social support, Positive therapeutic relationship, and Positive coping skills or problem solving skills   Continued Clinical Symptoms:  Substance use    Cognitive Features That Contribute To Risk:  None     Suicide Risk:  Mild   Plan Of Care/Follow-up recommendations:  Activity as tolerated. Diet as recommended by PCP. Keep all scheduled follow-up appointments as recommended.   Patient is instructed to take all prescribed medications as recommended. Report any side effects or adverse reactions to your outpatient psychiatrist. Patient is instructed to abstain from alcohol and illegal drugs while on prescription medications. In the event of worsening symptoms, patient is instructed to call the crisis hotline, 911, or go to the nearest emergency department for evaluation and treatment.     Patient is also instructed prior to discharge to: Take all medications as prescribed by mental healthcare provider. Report any adverse effects and or reactions from the medicines to outpatient provider promptly. Patient has been instructed & cautioned: To not engage in  alcohol and or illegal drug use while on prescription medicines. In the event of worsening symptoms,  patient is instructed to call the crisis hotline, 911 and or go to the nearest ED for appropriate evaluation and treatment of symptoms. To follow-up with primary care provider for other medical issues, concerns and or health care needs    Disposition: Self-care   Carlyn Reichert, MD PGY-3

## 2023-05-06 NOTE — ED Notes (Signed)
 Pt sleeping at present, no distress noted, monitoring for safety.

## 2023-05-06 NOTE — ED Notes (Signed)
 Patient resting with eyes closed in no apparent acute distress. Respirations even and unlabored. Environment secured. Safety checks in place according to facility policy.

## 2023-05-06 NOTE — ED Notes (Signed)
 Patient alert & oriented x4. Denies intent to harm self or others when asked. Denies A/VH. Patient denies any physical complaints when asked. No acute distress noted. Support and encouragement provided. Routine safety checks conducted per facility protocol. Encouraged patient to notify staff if any thoughts of harm towards self or others arise. Patient verbalizes understanding and agreement.  Patient discharged home per MD order. After Visit Summary (AVS) printed and given to patient. AVS reviewed with patient and all questions fully answered. Patient discharged in no acute distress, A& O x4 and ambulatory. Patient denied SI/HI, A/VH upon discharge. Patient verbalized understanding of all discharge instructions explained by staff included safety plan. Patient mood fair. Patient belongings returned to patient from locker #19 complete and intact. Patient escorted to lobby via staff for transport to destination. Safety maintained.

## 2023-05-06 NOTE — Discharge Instructions (Signed)
Patient is instructed prior to discharge to:  Take all medications as prescribed by his/her mental healthcare provider. Report any adverse effects and or reactions from the medicines to his/her outpatient provider promptly. Keep all scheduled appointments, to ensure that you are getting refills on time and to avoid any interruption in your medication.  If you are unable to keep an appointment call to reschedule.  Be sure to follow-up with resources and follow-up appointments provided.  Patient has been instructed & cautioned: To not engage in alcohol and or illegal drug use while on prescription medicines. In the event of worsening symptoms, patient is instructed to call the crisis hotline, 911 and or go to the nearest ED for appropriate evaluation and treatment of symptoms. To follow-up with his/her primary care provider for your other medical issues, concerns and or health care needs.    

## 2023-05-06 NOTE — ED Notes (Signed)
 Abnormal BP of 127/92 obtained manually. Patient asymptomatic, in no acute distress. Provider Loleta Chance, MD and Carlyn Reichert, MD made aware.

## 2023-05-21 ENCOUNTER — Ambulatory Visit: Payer: Self-pay | Admitting: Nurse Practitioner

## 2023-09-20 ENCOUNTER — Encounter (HOSPITAL_COMMUNITY): Payer: Self-pay

## 2023-09-20 ENCOUNTER — Inpatient Hospital Stay (HOSPITAL_COMMUNITY)
Admission: EM | Admit: 2023-09-20 | Discharge: 2023-09-27 | DRG: 812 | Disposition: A | Discharge status: Home / Self Care | Payer: MEDICAID | Attending: Internal Medicine | Admitting: Internal Medicine

## 2023-09-20 ENCOUNTER — Other Ambulatory Visit: Payer: Self-pay

## 2023-09-20 DIAGNOSIS — F1410 Cocaine abuse, uncomplicated: Secondary | ICD-10-CM | POA: Diagnosis present

## 2023-09-20 DIAGNOSIS — K5900 Constipation, unspecified: Secondary | ICD-10-CM | POA: Diagnosis present

## 2023-09-20 DIAGNOSIS — Z8249 Family history of ischemic heart disease and other diseases of the circulatory system: Secondary | ICD-10-CM

## 2023-09-20 DIAGNOSIS — Z79899 Other long term (current) drug therapy: Secondary | ICD-10-CM

## 2023-09-20 DIAGNOSIS — D5700 Hb-SS disease with crisis, unspecified: Principal | ICD-10-CM | POA: Diagnosis present

## 2023-09-20 DIAGNOSIS — Z833 Family history of diabetes mellitus: Secondary | ICD-10-CM

## 2023-09-20 DIAGNOSIS — Z832 Family history of diseases of the blood and blood-forming organs and certain disorders involving the immune mechanism: Secondary | ICD-10-CM

## 2023-09-20 DIAGNOSIS — Z825 Family history of asthma and other chronic lower respiratory diseases: Secondary | ICD-10-CM

## 2023-09-20 DIAGNOSIS — D72829 Elevated white blood cell count, unspecified: Secondary | ICD-10-CM | POA: Diagnosis present

## 2023-09-20 DIAGNOSIS — F17210 Nicotine dependence, cigarettes, uncomplicated: Secondary | ICD-10-CM | POA: Diagnosis present

## 2023-09-20 DIAGNOSIS — G894 Chronic pain syndrome: Secondary | ICD-10-CM | POA: Diagnosis present

## 2023-09-20 DIAGNOSIS — K219 Gastro-esophageal reflux disease without esophagitis: Secondary | ICD-10-CM | POA: Diagnosis present

## 2023-09-20 DIAGNOSIS — D638 Anemia in other chronic diseases classified elsewhere: Secondary | ICD-10-CM | POA: Diagnosis present

## 2023-09-20 DIAGNOSIS — J4520 Mild intermittent asthma, uncomplicated: Secondary | ICD-10-CM | POA: Diagnosis present

## 2023-09-20 DIAGNOSIS — Z79891 Long term (current) use of opiate analgesic: Secondary | ICD-10-CM

## 2023-09-20 DIAGNOSIS — I10 Essential (primary) hypertension: Secondary | ICD-10-CM | POA: Diagnosis present

## 2023-09-20 LAB — RAPID URINE DRUG SCREEN, HOSP PERFORMED
Amphetamines: NOT DETECTED
Barbiturates: NOT DETECTED
Benzodiazepines: NOT DETECTED
Cocaine: POSITIVE — AB
Opiates: POSITIVE — AB
Tetrahydrocannabinol: NOT DETECTED

## 2023-09-20 LAB — CBC WITH DIFFERENTIAL/PLATELET
Abs Immature Granulocytes: 0.05 K/uL (ref 0.00–0.07)
Basophils Absolute: 0.1 K/uL (ref 0.0–0.1)
Basophils Relative: 1 %
Eosinophils Absolute: 0.4 K/uL (ref 0.0–0.5)
Eosinophils Relative: 5 %
HCT: 40.4 % (ref 39.0–52.0)
Hemoglobin: 14.9 g/dL (ref 13.0–17.0)
Immature Granulocytes: 1 %
Lymphocytes Relative: 40 %
Lymphs Abs: 3.9 K/uL (ref 0.7–4.0)
MCH: 29.4 pg (ref 26.0–34.0)
MCHC: 36.9 g/dL — ABNORMAL HIGH (ref 30.0–36.0)
MCV: 79.8 fL — ABNORMAL LOW (ref 80.0–100.0)
Monocytes Absolute: 1.3 K/uL — ABNORMAL HIGH (ref 0.1–1.0)
Monocytes Relative: 14 %
Neutro Abs: 3.9 K/uL (ref 1.7–7.7)
Neutrophils Relative %: 39 %
Platelets: 390 K/uL (ref 150–400)
RBC: 5.06 MIL/uL (ref 4.22–5.81)
RDW: 14.6 % (ref 11.5–15.5)
WBC: 9.7 K/uL (ref 4.0–10.5)
nRBC: 2.2 % — ABNORMAL HIGH (ref 0.0–0.2)

## 2023-09-20 LAB — RETICULOCYTES
Immature Retic Fract: 20.5 % — ABNORMAL HIGH (ref 2.3–15.9)
RBC.: 4.7 MIL/uL (ref 4.22–5.81)
Retic Count, Absolute: 167.5 K/uL (ref 19.0–186.0)
Retic Ct Pct: 3.6 % — ABNORMAL HIGH (ref 0.4–3.1)

## 2023-09-20 LAB — COMPREHENSIVE METABOLIC PANEL WITH GFR
ALT: 16 U/L (ref 0–44)
AST: 23 U/L (ref 15–41)
Albumin: 4.3 g/dL (ref 3.5–5.0)
Alkaline Phosphatase: 88 U/L (ref 38–126)
Anion gap: 9 (ref 5–15)
BUN: 11 mg/dL (ref 6–20)
CO2: 23 mmol/L (ref 22–32)
Calcium: 10.9 mg/dL — ABNORMAL HIGH (ref 8.9–10.3)
Chloride: 105 mmol/L (ref 98–111)
Creatinine, Ser: 1.12 mg/dL (ref 0.61–1.24)
GFR, Estimated: >60 mL/min (ref >60)
Glucose, Bld: 94 mg/dL (ref 70–99)
Potassium: 4.2 mmol/L (ref 3.5–5.1)
Sodium: 137 mmol/L (ref 135–145)
Total Bilirubin: 1.6 mg/dL — ABNORMAL HIGH (ref 0.0–1.2)
Total Protein: 8.1 g/dL (ref 6.5–8.1)

## 2023-09-20 MED ORDER — LOSARTAN POTASSIUM-HCTZ 100-25 MG PO TABS: 1.0000 | ORAL_TABLET | Freq: Every day | ORAL | Status: DC

## 2023-09-20 MED ORDER — HYDROMORPHONE HCL 1 MG/ML IJ SOLN
2.0000 mg | INTRAMUSCULAR | Status: AC
  Administered 2023-09-20: 2 mg via INTRAVENOUS
  Filled 2023-09-20: qty 2

## 2023-09-20 MED ORDER — ACETAMINOPHEN 325 MG PO TABS
650.0000 mg | ORAL_TABLET | Freq: Four times a day (QID) | ORAL | Status: DC | PRN
Start: 2023-09-20 — End: 2023-09-27
  Administered 2023-09-23 – 2023-09-26 (×7): 650 mg via ORAL
  Filled 2023-09-20 (×4): qty 2

## 2023-09-20 MED ORDER — MONTELUKAST SODIUM 10 MG PO TABS
10.0000 mg | ORAL_TABLET | Freq: Every day | ORAL | Status: DC
  Administered 2023-09-20 – 2023-09-26 (×10): 10 mg via ORAL
  Filled 2023-09-20 (×7): qty 1

## 2023-09-20 MED ORDER — SODIUM CHLORIDE 0.9 % IV SOLN
12.5000 mg | Freq: Once | INTRAVENOUS | Status: AC
  Administered 2023-09-20: 12.5 mg via INTRAVENOUS
  Filled 2023-09-20: qty 12.5

## 2023-09-20 MED ORDER — SODIUM CHLORIDE 0.45 % IV SOLN: INTRAVENOUS | Status: AC

## 2023-09-20 MED ORDER — GABAPENTIN 300 MG PO CAPS
300.0000 mg | ORAL_CAPSULE | Freq: Two times a day (BID) | ORAL | Status: DC
  Administered 2023-09-20 – 2023-09-27 (×20): 300 mg via ORAL
  Filled 2023-09-20 (×14): qty 1

## 2023-09-20 MED ORDER — ONDANSETRON HCL 4 MG/2ML IJ SOLN
4.0000 mg | INTRAMUSCULAR | Status: DC | PRN
  Administered 2023-09-20: 4 mg via INTRAVENOUS
  Filled 2023-09-20: qty 2

## 2023-09-20 MED ORDER — NICOTINE 21 MG/24HR TD PT24
21.0000 mg | MEDICATED_PATCH | Freq: Every day | TRANSDERMAL | Status: DC
  Administered 2023-09-21 – 2023-09-27 (×10): 21 mg via TRANSDERMAL
  Filled 2023-09-20 (×7): qty 1

## 2023-09-20 MED ORDER — TIZANIDINE HCL 4 MG PO TABS
4.0000 mg | ORAL_TABLET | Freq: Four times a day (QID) | ORAL | Status: DC | PRN
  Administered 2023-09-23 – 2023-09-26 (×9): 4 mg via ORAL
  Filled 2023-09-20 (×5): qty 1

## 2023-09-20 MED ORDER — ENOXAPARIN SODIUM 40 MG/0.4ML IJ SOSY
40.0000 mg | PREFILLED_SYRINGE | INTRAMUSCULAR | Status: DC
  Administered 2023-09-20 – 2023-09-26 (×10): 40 mg via SUBCUTANEOUS
  Filled 2023-09-20 (×7): qty 0.4

## 2023-09-20 MED ORDER — METOPROLOL SUCCINATE ER 50 MG PO TB24
50.0000 mg | ORAL_TABLET | Freq: Every day | ORAL | Status: DC
  Administered 2023-09-20 – 2023-09-27 (×11): 50 mg via ORAL
  Filled 2023-09-20 (×8): qty 1

## 2023-09-20 MED ORDER — HYDROMORPHONE HCL 1 MG/ML IJ SOLN
0.5000 mg | INTRAMUSCULAR | Status: DC | PRN
  Administered 2023-09-20 – 2023-09-21 (×2): 1 mg via INTRAVENOUS
  Filled 2023-09-20 (×2): qty 1

## 2023-09-20 MED ORDER — LOSARTAN POTASSIUM 50 MG PO TABS
100.0000 mg | ORAL_TABLET | Freq: Every day | ORAL | Status: DC
  Administered 2023-09-20 – 2023-09-27 (×11): 100 mg via ORAL
  Filled 2023-09-20 (×8): qty 2

## 2023-09-20 MED ORDER — DULOXETINE HCL 30 MG PO CPEP
30.0000 mg | ORAL_CAPSULE | Freq: Every day | ORAL | Status: DC
  Administered 2023-09-20 – 2023-09-27 (×11): 30 mg via ORAL
  Filled 2023-09-20 (×8): qty 1

## 2023-09-20 MED ORDER — PANTOPRAZOLE SODIUM 40 MG PO TBEC
40.0000 mg | DELAYED_RELEASE_TABLET | Freq: Every day | ORAL | Status: DC
  Administered 2023-09-21 – 2023-09-27 (×10): 40 mg via ORAL
  Filled 2023-09-20 (×7): qty 1

## 2023-09-20 MED ORDER — CLONIDINE HCL 0.1 MG PO TABS
0.3000 mg | ORAL_TABLET | Freq: Every day | ORAL | Status: DC
  Administered 2023-09-21 – 2023-09-27 (×10): 0.3 mg via ORAL
  Filled 2023-09-20 (×7): qty 1

## 2023-09-20 MED ORDER — HYDROCHLOROTHIAZIDE 25 MG PO TABS
25.0000 mg | ORAL_TABLET | Freq: Every day | ORAL | Status: DC
  Administered 2023-09-20 – 2023-09-27 (×11): 25 mg via ORAL
  Filled 2023-09-20 (×8): qty 1

## 2023-09-20 MED ORDER — SODIUM CHLORIDE 0.9 % IV BOLUS
2000.0000 mL | Freq: Once | INTRAVENOUS | Status: AC
  Administered 2023-09-20: 2000 mL via INTRAVENOUS

## 2023-09-20 MED ORDER — ONDANSETRON HCL 4 MG/2ML IJ SOLN
4.0000 mg | Freq: Four times a day (QID) | INTRAMUSCULAR | Status: DC | PRN
  Administered 2023-09-20: 4 mg via INTRAVENOUS
  Filled 2023-09-20: qty 2

## 2023-09-20 MED ORDER — ONDANSETRON HCL 4 MG PO TABS: 4.0000 mg | ORAL_TABLET | Freq: Four times a day (QID) | ORAL | Status: DC | PRN

## 2023-09-20 MED ORDER — OXYCODONE HCL 5 MG PO TABS
10.0000 mg | ORAL_TABLET | ORAL | Status: DC | PRN
  Administered 2023-09-21 – 2023-09-27 (×27): 10 mg via ORAL
  Filled 2023-09-20 (×20): qty 2

## 2023-09-20 MED ORDER — AMLODIPINE BESYLATE 10 MG PO TABS
10.0000 mg | ORAL_TABLET | Freq: Every day | ORAL | Status: DC
  Administered 2023-09-21 – 2023-09-27 (×10): 10 mg via ORAL
  Filled 2023-09-20 (×7): qty 1

## 2023-09-20 MED ORDER — ACETAMINOPHEN 650 MG RE SUPP: 650.0000 mg | Freq: Four times a day (QID) | RECTAL | Status: DC | PRN

## 2023-09-20 MED ORDER — ALBUTEROL SULFATE (2.5 MG/3ML) 0.083% IN NEBU: 3.0000 mL | INHALATION_SOLUTION | RESPIRATORY_TRACT | Status: DC | PRN

## 2023-09-20 NOTE — ED Triage Notes (Signed)
 Patient said he has been having a sickle cell pain crisis for 2 days. Pain in both of his legs. Did not take any pain medication today.

## 2023-09-20 NOTE — ED Provider Notes (Signed)
 [place] EMERGENCY DEPARTMENT AT [facility] Provider Note   CSN: [id number] Arrival date & time: 09/20/23  1551     Patient presents with: Sickle Cell Pain Crisis   Dean Webb is a 48 y.o. male.   48 year old male with history of sickle cell disease presents with 2 days of bilateral lower leg pain.  States that this feels like his usual pain crisis.  Denies any fever, cough congestion.  Pain has been unrelieved by his home medications.  Patient is unsure of what is causing his current crisis.  Denies any urinary symptoms.  Pain is worse with ambulation better with rest somewhat.       Prior to Admission medications   Medication Sig Start Date End Date Taking? Authorizing Provider  albuterol  (VENTOLIN  HFA) 108 (90 Base) MCG/ACT inhaler TAKE 2 PUFFS BY MOUTH EVERY 6 HOURS AS NEEDED FOR WHEEZE OR SHORTNESS OF BREATH 09/05/22   Oley Bascom RAMAN, NP  amLODipine  (NORVASC ) 10 MG tablet Take 1 tablet (10 mg total) by mouth daily. 04/17/22   Oley Bascom RAMAN, NP  cetirizine  (ZYRTEC ) 10 MG tablet TAKE 1 TABLET BY MOUTH EVERY DAY AS NEEDED FOR ALLERGY 04/17/22   Nichols, Tonya S, NP  cloNIDine  (CATAPRES ) 0.1 MG tablet TAKE 1 TABLET BY MOUTH 2 TIMES DAILY. Patient taking differently: Take 0.3 mg by mouth daily. 07/17/22   Oley Bascom RAMAN, NP  DULoxetine  (CYMBALTA ) 30 MG capsule Take 1 capsule (30 mg total) by mouth daily. 08/25/22   Pokhrel, Laxman, MD  gabapentin  (NEURONTIN ) 300 MG capsule Take 1 capsule (300 mg total) by mouth 2 (two) times daily. 07/17/22   Oley Bascom RAMAN, NP  losartan -hydrochlorothiazide  (HYZAAR) 100-25 MG tablet Take 1 tablet by mouth daily. 08/27/22   Pokhrel, Laxman, MD  metoprolol  succinate (TOPROL -XL) 50 MG 24 hr tablet Take 1 tablet (50 mg total) by mouth daily. Take with or immediately following a meal. 04/17/22   Oley Bascom RAMAN, NP  montelukast  (SINGULAIR ) 10 MG tablet TAKE 1 TABLET BY MOUTH EVERYDAY AT BEDTIME 04/17/22   Nichols, Tonya S, NP  nicotine   (NICODERM CQ  - DOSED IN MG/24 HOURS) 21 mg/24hr patch Place 1 patch (21 mg total) onto the skin daily. 05/06/23   Marry Clamp, MD  pantoprazole  (PROTONIX ) 40 MG tablet Take 1 tablet (40 mg total) by mouth daily. 04/17/22   Nichols, Tonya S, NP  tiZANidine  (ZANAFLEX ) 4 MG tablet Take 1 tablet (4 mg total) by mouth every 6 (six) hours as needed for muscle spasms. 10/13/22   Oley Bascom RAMAN, NP    Allergies: Patient has no known allergies.    Review of Systems  All other systems reviewed and are negative.   Updated Vital Signs BP (!) 162/110   Pulse 92   Temp 98.3 F (36.8 C) (Oral)   Resp 17   Ht 1.702 m (5' 7)   Wt 90.7 kg   SpO2 100%   BMI 31.32 kg/m   Physical Exam Vitals and nursing note reviewed.  Constitutional:      General: He is not in acute distress.    Appearance: Normal appearance. He is well-developed. He is not toxic-appearing.  HENT:     Head: Normocephalic and atraumatic.  Eyes:     General: Lids are normal.     Conjunctiva/sclera: Conjunctivae normal.     Pupils: Pupils are equal, round, and reactive to light.  Neck:     Thyroid : No thyroid  mass.     Trachea: No tracheal  deviation.  Cardiovascular:     Rate and Rhythm: Normal rate and regular rhythm.     Heart sounds: Normal heart sounds. No murmur heard.    No gallop.  Pulmonary:     Effort: Pulmonary effort is normal. No respiratory distress.     Breath sounds: Normal breath sounds. No stridor. No decreased breath sounds, wheezing, rhonchi or rales.  Abdominal:     General: There is no distension.     Palpations: Abdomen is soft.     Tenderness: There is no abdominal tenderness. There is no rebound.  Musculoskeletal:        General: No tenderness. Normal range of motion.     Cervical back: Normal range of motion and neck supple.     Comments: No joint involvement appreciated in patient's bilateral knees or ankles  Skin:    General: Skin is warm and dry.     Findings: No abrasion or rash.   Neurological:     Mental Status: He is alert and oriented to person, place, and time. Mental status is at baseline.     GCS: GCS eye subscore is 4. GCS verbal subscore is 5. GCS motor subscore is 6.     Cranial Nerves: No cranial nerve deficit.     Sensory: No sensory deficit.     Motor: Motor function is intact.  Psychiatric:        Attention and Perception: Attention normal.        Speech: Speech normal.        Behavior: Behavior normal.     (all labs ordered are listed, but only abnormal results are displayed) Labs Reviewed  COMPREHENSIVE METABOLIC PANEL WITH GFR  CBC WITH DIFFERENTIAL/PLATELET  RETICULOCYTES    EKG: None  Radiology: No results found.   Procedures   Medications Ordered in the ED  0.45 % sodium chloride  infusion (has no administration in time range)  HYDROmorphone  (DILAUDID ) injection 2 mg (has no administration in time range)  HYDROmorphone  (DILAUDID ) injection 2 mg (has no administration in time range)  HYDROmorphone  (DILAUDID ) injection 2 mg (has no administration in time range)  diphenhydrAMINE  (BENADRYL ) 12.5 mg in sodium chloride  0.9 % 50 mL IVPB (has no administration in time range)  ondansetron  (ZOFRAN ) injection 4 mg (has no administration in time range)                                    Medical Decision Making Amount and/or Complexity of Data Reviewed Labs: ordered.  Risk Prescription drug management. Decision regarding hospitalization.   Patient given IV fluids as well as IV analgesics.  Blood work is noncontributory.  Hemoglobin is stable.  After 3 doses of IV hydromorphone , patient continues to be in pain.  Will admit to the hospitalist team     Final diagnoses:  None    ED Discharge Orders     None          Dasie Faden, MD 09/20/23 1912

## 2023-09-20 NOTE — H&P (Signed)
 History and Physical    Dean Webb FMW:[id number] DOB: Jan 02, 1976 DOA: 09/20/2023  PCP: Dean Bascom RAMAN, NP  Patient coming from: Home  I have personally briefly reviewed patient'Webb old medical records available.   Chief Complaint: Both legs hurt for 2 days  HPI: Dean Webb is a 48 y.o. male with medical history significant of sickle cell disease, mild intermittent asthma, GERD, hypertension, cocaine  abuse, ongoing smoker presents to the emergency room with about 2 days of bilateral thigh pain, 10 out of 10 at times, no radiation, no swelling.  Ran out of opiates as per the patient.  Has some nausea associated with the pain.  Denies any chest pain, shortness of breath, hemoptysis or hematemesis.  Denies any headache or vomiting.  Urine and bowel habits are normal.  Denies any rashes or joint swelling.  He did not try anything at home.  Patient denies recently using cocaine  but he has been using cocaine  in the past. ED Course: Hemodynamically stable.  On room air.  WBC count is normal.  RBC count is normal.  Hemoglobin normal.  Reticulocyte count pending.  Patient was given dose of Zofran , 3 doses of IV Dilaudid  and was still complaining of pain so ER physician requested admission.  On my interview, patient was snoring and woke up.  He was wondering about missing his appointment for colonoscopy and he needs 1.  On my interview, pain is controlled with mild pain remaining.  Review of Systems: all systems are reviewed and pertinent positive as per HPI otherwise rest are negative.    Past Medical History:  Diagnosis Date   Asthma    GERD (gastroesophageal reflux disease)    Hb-Webb/Hb-C disease (HCC) 1979   HTN (hypertension) 2008   Hypertension    Sickle cell anemia (HCC)    Sickle cell disease (HCC)     Past Surgical History:  Procedure Laterality Date   CHOLECYSTECTOMY      Social history   reports that he has been smoking cigarettes. He has a 8.5 pack-year smoking history.  He has never used smokeless tobacco. He reports current alcohol use. He reports that he does not currently use drugs after having used the following drugs: Marijuana.  No Known Allergies  Family History  Problem Relation Age of Onset   Sickle cell trait Mother    Hypertension Mother    Asthma Mother    Sickle cell trait Father    Congestive Heart Failure Father    Hypertension Sister    Sickle cell anemia Daughter    Hypertension Daughter    Diabetes Daughter      Prior to Admission medications   Medication Sig Start Date End Date Taking? Authorizing Provider  albuterol  (VENTOLIN  HFA) 108 (90 Base) MCG/ACT inhaler TAKE 2 PUFFS BY MOUTH EVERY 6 HOURS AS NEEDED FOR WHEEZE OR SHORTNESS OF BREATH 09/05/22   Dean Bascom RAMAN, NP  amLODipine  (NORVASC ) 10 MG tablet Take 1 tablet (10 mg total) by mouth daily. 04/17/22   Dean Bascom RAMAN, NP  cetirizine  (ZYRTEC ) 10 MG tablet TAKE 1 TABLET BY MOUTH EVERY DAY AS NEEDED FOR ALLERGY 04/17/22   Nichols, Tonya S, NP  cloNIDine  (CATAPRES ) 0.1 MG tablet TAKE 1 TABLET BY MOUTH 2 TIMES DAILY. Patient taking differently: Take 0.3 mg by mouth daily. 07/17/22   Dean Bascom RAMAN, NP  DULoxetine  (CYMBALTA ) 30 MG capsule Take 1 capsule (30 mg total) by mouth daily. 08/25/22   Pokhrel, Laxman, MD  gabapentin  (NEURONTIN ) 300 MG  capsule Take 1 capsule (300 mg total) by mouth 2 (two) times daily. 07/17/22   Dean Bascom RAMAN, NP  losartan -hydrochlorothiazide  (HYZAAR) 100-25 MG tablet Take 1 tablet by mouth daily. 08/27/22   Pokhrel, Laxman, MD  metoprolol  succinate (TOPROL -XL) 50 MG 24 hr tablet Take 1 tablet (50 mg total) by mouth daily. Take with or immediately following a meal. 04/17/22   Nichols, Tonya S, NP  montelukast  (SINGULAIR ) 10 MG tablet TAKE 1 TABLET BY MOUTH EVERYDAY AT BEDTIME 04/17/22   Nichols, Tonya S, NP  nicotine  (NICODERM CQ  - DOSED IN MG/24 HOURS) 21 mg/24hr patch Place 1 patch (21 mg total) onto the skin daily. 05/06/23   Dean Clamp, MD  pantoprazole   (PROTONIX ) 40 MG tablet Take 1 tablet (40 mg total) by mouth daily. 04/17/22   Nichols, Tonya S, NP  tiZANidine  (ZANAFLEX ) 4 MG tablet Take 1 tablet (4 mg total) by mouth every 6 (six) hours as needed for muscle spasms. 10/13/22   Dean Bascom RAMAN, NP    Physical Exam: Vitals:   09/20/23 1557 09/20/23 1558  BP:  (!) 162/110  Pulse:  92  Resp:  17  Temp:  98.3 F (36.8 C)  TempSrc:  Oral  SpO2:  100%  Weight: 90.7 kg   Height: 5' 7 (1.702 m)     Constitutional: NAD, calm, comfortable Vitals:   09/20/23 1557 09/20/23 1558  BP:  (!) 162/110  Pulse:  92  Resp:  17  Temp:  98.3 F (36.8 C)  TempSrc:  Oral  SpO2:  100%  Weight: 90.7 kg   Height: 5' 7 (1.702 m)    Eyes: PERRL, lids and conjunctivae normal ENMT: Mucous membranes are moist. Posterior pharynx clear of any exudate or lesions.Normal dentition.  Neck: normal, supple, no masses, no thyromegaly Respiratory: clear to auscultation bilaterally, no wheezing, no crackles. Normal respiratory effort. No accessory muscle use.  Cardiovascular: Regular rate and rhythm, no murmurs / rubs / gallops. No extremity edema. 2+ pedal pulses. No carotid bruits.  Abdomen: no tenderness, no masses palpated. No hepatosplenomegaly. Bowel sounds positive.  Musculoskeletal: no clubbing / cyanosis. No joint deformity upper and lower extremities. Good ROM, no contractures. Normal muscle tone.  Complains of mild tenderness on both eye exam without evidence of swelling or restricted range of motion. Skin: no rashes, lesions, ulcers. No induration Neurologic: CN 2-12 grossly intact. Sensation intact, DTR normal. Strength 5/5 in all 4.  Psychiatric: Normal judgment and insight. Alert and oriented x 3. Normal mood.     Labs on Admission: I have personally reviewed following labs and imaging studies  CBC: Recent Labs  Lab 09/20/23 1629  WBC 9.7  NEUTROABS 3.9  HGB 14.9  HCT 40.4  MCV 79.8*  PLT 390   Basic Metabolic Panel: Recent Labs   Lab 09/20/23 1629  NA 137  K 4.2  CL 105  CO2 23  GLUCOSE 94  BUN 11  CREATININE 1.12  CALCIUM 10.9*   GFR: Estimated Creatinine Clearance: 87.5 mL/min (by C-G formula based on SCr of 1.12 mg/dL). Liver Function Tests: Recent Labs  Lab 09/20/23 1629  AST 23  ALT 16  ALKPHOS 88  BILITOT 1.6*  PROT 8.1  ALBUMIN 4.3   No results for input(Webb): LIPASE, AMYLASE in the last 168 hours. No results for input(Webb): AMMONIA in the last 168 hours. Coagulation Profile: No results for input(Webb): INR, PROTIME in the last 168 hours. Cardiac Enzymes: No results for input(Webb): CKTOTAL, CKMB, CKMBINDEX, TROPONINI in the last  168 hours. BNP (last 3 results) No results for input(Webb): PROBNP in the last 8760 hours. HbA1C: No results for input(Webb): HGBA1C in the last 72 hours. CBG: No results for input(Webb): GLUCAP in the last 168 hours. Lipid Profile: No results for input(Webb): CHOL, HDL, LDLCALC, TRIG, CHOLHDL, LDLDIRECT in the last 72 hours. Thyroid  Function Tests: No results for input(Webb): TSH, T4TOTAL, FREET4, T3FREE, THYROIDAB in the last 72 hours. Anemia Panel: No results for input(Webb): VITAMINB12, FOLATE, FERRITIN, TIBC, IRON, RETICCTPCT in the last 72 hours. Urine analysis:    Component Value Date/Time   COLORURINE AMBER (A) 08/23/2022 1839   APPEARANCEUR HAZY (A) 08/23/2022 1839   LABSPEC 1.012 08/23/2022 1839   PHURINE 5.0 08/23/2022 1839   GLUCOSEU NEGATIVE 08/23/2022 1839   HGBUR LARGE (A) 08/23/2022 1839   BILIRUBINUR NEGATIVE 08/23/2022 1839   BILIRUBINUR negative 01/17/2021 1608   BILIRUBINUR neg 08/26/2020 1044   KETONESUR NEGATIVE 08/23/2022 1839   PROTEINUR 100 (A) 08/23/2022 1839   UROBILINOGEN 0.2 01/17/2021 1608   UROBILINOGEN 0.2 11/01/2012 1010   NITRITE NEGATIVE 08/23/2022 1839   LEUKOCYTESUR NEGATIVE 08/23/2022 1839    Radiological Exams on Admission: No results found.  EKG: Independently reviewed.   Recent EKG reviewed.  QTc is 435.  Assessment/Plan Principal Problem:   Sickle cell crisis (HCC) Active Problems:   Essential hypertension   Chronic prescription opiate use   GERD (gastroesophageal reflux disease)   Mild intermittent asthma without complication     1.  Sickle cell disease with pain crisis: Patient currently hemodynamically and neurologically stable.  Bilirubin is 1.6.  Reticulocyte count pending.  Hemoglobin is stable.  Monitor overnight.  Will give 2 L isotonic fluid and continue on maintenance IV fluids overnight.  Will check CK levels.  Repeat reticulocyte count in the morning. Continue adequate oral and IV opiates for pain relief, resume Cymbalta , gabapentin . Discuss with sickle cell team in the morning as they can take him under their service.  2.  Chronic intermittent asthma without complications: Fairly stable.  On Singulair , albuterol  as needed.  3.  Hypertension: Resume metoprolol , clonidine .  Blood pressures elevated.  First dose now.  4.  Smoker: Counseled to quit.  Nicotine  patch.  5.  Drug use disorder: Patient currently denies drug use.  Check UDS.    DVT prophylaxis: Lovenox  Code Status: Full code Family Communication: None at the bedside Disposition Plan: Home when stable Consults called: None Admission status: Observation.   Renato Applebaum MD [facility]

## 2023-09-21 DIAGNOSIS — Z79891 Long term (current) use of opiate analgesic: Secondary | ICD-10-CM | POA: Diagnosis not present

## 2023-09-21 DIAGNOSIS — J4520 Mild intermittent asthma, uncomplicated: Secondary | ICD-10-CM | POA: Diagnosis present

## 2023-09-21 DIAGNOSIS — Z79899 Other long term (current) drug therapy: Secondary | ICD-10-CM | POA: Diagnosis not present

## 2023-09-21 DIAGNOSIS — F1410 Cocaine abuse, uncomplicated: Secondary | ICD-10-CM | POA: Diagnosis present

## 2023-09-21 DIAGNOSIS — I10 Essential (primary) hypertension: Secondary | ICD-10-CM | POA: Diagnosis present

## 2023-09-21 DIAGNOSIS — Z832 Family history of diseases of the blood and blood-forming organs and certain disorders involving the immune mechanism: Secondary | ICD-10-CM | POA: Diagnosis not present

## 2023-09-21 DIAGNOSIS — D638 Anemia in other chronic diseases classified elsewhere: Secondary | ICD-10-CM | POA: Diagnosis present

## 2023-09-21 DIAGNOSIS — D5700 Hb-SS disease with crisis, unspecified: Secondary | ICD-10-CM

## 2023-09-21 DIAGNOSIS — Z825 Family history of asthma and other chronic lower respiratory diseases: Secondary | ICD-10-CM | POA: Diagnosis not present

## 2023-09-21 DIAGNOSIS — Z833 Family history of diabetes mellitus: Secondary | ICD-10-CM | POA: Diagnosis not present

## 2023-09-21 DIAGNOSIS — Z8249 Family history of ischemic heart disease and other diseases of the circulatory system: Secondary | ICD-10-CM | POA: Diagnosis not present

## 2023-09-21 DIAGNOSIS — G894 Chronic pain syndrome: Secondary | ICD-10-CM | POA: Diagnosis present

## 2023-09-21 DIAGNOSIS — D72829 Elevated white blood cell count, unspecified: Secondary | ICD-10-CM | POA: Diagnosis present

## 2023-09-21 DIAGNOSIS — K219 Gastro-esophageal reflux disease without esophagitis: Secondary | ICD-10-CM | POA: Diagnosis present

## 2023-09-21 DIAGNOSIS — F17210 Nicotine dependence, cigarettes, uncomplicated: Secondary | ICD-10-CM | POA: Diagnosis present

## 2023-09-21 DIAGNOSIS — K5900 Constipation, unspecified: Secondary | ICD-10-CM | POA: Diagnosis present

## 2023-09-21 LAB — RETICULOCYTES
Immature Retic Fract: 14.5 % (ref 2.3–15.9)
RBC.: 4.45 MIL/uL (ref 4.22–5.81)
Retic Count, Absolute: 183.5 K/uL (ref 19.0–186.0)
Retic Ct Pct: 4.1 % — ABNORMAL HIGH (ref 0.4–3.1)

## 2023-09-21 LAB — CBC
HCT: 37.8 % — ABNORMAL LOW (ref 39.0–52.0)
Hemoglobin: 13.9 g/dL (ref 13.0–17.0)
MCH: 29.8 pg (ref 26.0–34.0)
MCHC: 36.8 g/dL — ABNORMAL HIGH (ref 30.0–36.0)
MCV: 81.1 fL (ref 80.0–100.0)
Platelets: 297 K/uL (ref 150–400)
RBC: 4.66 MIL/uL (ref 4.22–5.81)
RDW: 15 % (ref 11.5–15.5)
WBC: 10.7 K/uL — ABNORMAL HIGH (ref 4.0–10.5)
nRBC: 1.7 % — ABNORMAL HIGH (ref 0.0–0.2)

## 2023-09-21 LAB — CK: Total CK: 500 U/L — ABNORMAL HIGH (ref 49–397)

## 2023-09-21 LAB — HIV ANTIBODY (ROUTINE TESTING W REFLEX): HIV Screen 4th Generation wRfx: NONREACTIVE

## 2023-09-21 MED ORDER — PROCHLORPERAZINE EDISYLATE 10 MG/2ML IJ SOLN
5.0000 mg | Freq: Once | INTRAMUSCULAR | Status: AC
  Administered 2023-09-21: 5 mg via INTRAVENOUS
  Filled 2023-09-21: qty 2

## 2023-09-21 MED ORDER — KETOROLAC TROMETHAMINE 15 MG/ML IJ SOLN
15.0000 mg | Freq: Four times a day (QID) | INTRAMUSCULAR | Status: AC
  Administered 2023-09-21 – 2023-09-26 (×29): 15 mg via INTRAVENOUS
  Filled 2023-09-21 (×21): qty 1

## 2023-09-21 MED ORDER — SODIUM CHLORIDE 0.45 % IV SOLN: INTRAVENOUS | Status: AC

## 2023-09-21 MED ORDER — PROCHLORPERAZINE EDISYLATE 10 MG/2ML IJ SOLN
5.0000 mg | Freq: Four times a day (QID) | INTRAMUSCULAR | Status: DC | PRN
  Administered 2023-09-22 – 2023-09-24 (×3): 5 mg via INTRAVENOUS
  Filled 2023-09-21 (×2): qty 2

## 2023-09-21 MED ORDER — HYDROMORPHONE HCL 1 MG/ML IJ SOLN
1.0000 mg | INTRAMUSCULAR | Status: DC | PRN
  Administered 2023-09-21 – 2023-09-27 (×19): 1 mg via INTRAVENOUS
  Filled 2023-09-21 (×13): qty 1

## 2023-09-21 NOTE — Progress Notes (Signed)
   09/21/23 0842  TOC Brief Assessment  Insurance and Status Reviewed  Patient has primary care physician Yes  Home environment has been reviewed Resides alone  Prior level of function: Independent with ADLs at baseline  Prior/Current Home Services No current home services  Social Drivers of Health Review SDOH reviewed no interventions necessary  Readmission risk has been reviewed Yes  Transition of care needs no transition of care needs at this time

## 2023-09-21 NOTE — Progress Notes (Signed)
 Patient ID: Dean Webb, male   DOB: 06/08/1975, 48 y.o.   MRN: [id number] Subjective: Dean Webb is a 48 year old male with a medical history significant for sickle cell disease, chronic pain syndrome, opiate dependence and tolerance, and history of anemia of chronic disease was admitted for sickle cell pain crisis.  Patient has no new complaint today but still in significant pain, generalized.  He rates his pain at 7/10 this morning.  He said his pain is throbbing and achy.  There is no joint redness or swelling.  He denies any fever, cough, chest pain, shortness of breath, nausea, vomiting or diarrhea.  No urinary symptoms.  Objective:  Vital signs in last 24 hours:  Vitals:   09/21/23 0619 09/21/23 0938 09/21/23 1325 09/21/23 1817  BP: 126/77 (!) 138/101 131/86 128/73  Pulse: (!) 48 61 61 61  Resp: 20 20 20 20   Temp: 98.1 F (36.7 C) 98.4 F (36.9 C) 98.4 F (36.9 C) 98.8 F (37.1 C)  TempSrc:  Oral Oral Oral  SpO2: 98% 96% 99% 96%  Weight:      Height:        Intake/Output from previous day:   Intake/Output Summary (Last 24 hours) at 09/21/2023 1821 Last data filed at 09/21/2023 1814 Gross per 24 hour  Intake 2542.6 ml  Output 2050 ml  Net 492.6 ml    Physical Exam: General: Alert, awake, oriented x3, in no acute distress.  HEENT: [place]/AT PEERL, EOMI Neck: Trachea midline,  no masses, no thyromegal,y no JVD, no carotid bruit OROPHARYNX:  Moist, No exudate/ erythema/lesions.  Heart: Regular rate and rhythm, without murmurs, rubs, gallops, PMI non-displaced, no heaves or thrills on palpation.  Lungs: Clear to auscultation, no wheezing or rhonchi noted. No increased vocal fremitus resonant to percussion  Abdomen: Soft, nontender, nondistended, positive bowel sounds, no masses no hepatosplenomegaly noted..  Neuro: No focal neurological deficits noted cranial nerves II through XII grossly intact. DTRs 2+ bilaterally upper and lower extremities. Strength 5 out of 5 in  bilateral upper and lower extremities. Musculoskeletal: No warm swelling or erythema around joints, no spinal tenderness noted. Psychiatric: Patient alert and oriented x3, good insight and cognition, good recent to remote recall. Lymph node survey: No cervical axillary or inguinal lymphadenopathy noted.  Lab Results:  Basic Metabolic Panel:    Component Value Date/Time   NA 137 09/20/2023 1629   NA 135 11/09/2022 1416   K 4.2 09/20/2023 1629   CL 105 09/20/2023 1629   CO2 23 09/20/2023 1629   BUN 11 09/20/2023 1629   BUN 13 11/09/2022 1416   CREATININE 1.12 09/20/2023 1629   GLUCOSE 94 09/20/2023 1629   CALCIUM 10.9 (H) 09/20/2023 1629   CBC:    Component Value Date/Time   WBC 10.7 (H) 09/21/2023 0454   HGB 13.9 09/21/2023 0454   HGB 14.4 11/09/2022 1416   HCT 37.8 (L) 09/21/2023 0454   HCT 41.0 11/09/2022 1416   PLT 297 09/21/2023 0454   PLT 303 11/09/2022 1416   MCV 81.1 09/21/2023 0454   MCV 88 11/09/2022 1416   NEUTROABS 3.9 09/20/2023 1629   NEUTROABS 6.7 11/09/2022 1416   LYMPHSABS 3.9 09/20/2023 1629   LYMPHSABS 4.2 (H) 11/09/2022 1416   MONOABS 1.3 (H) 09/20/2023 1629   EOSABS 0.4 09/20/2023 1629   EOSABS 0.4 11/09/2022 1416   BASOSABS 0.1 09/20/2023 1629   BASOSABS 0.1 11/09/2022 1416    No results found for this or any previous visit (from the past 240  hours).  Studies/Results: No results found.  Medications: Scheduled Meds:  amLODipine   10 mg Oral Daily   cloNIDine   0.3 mg Oral Daily   DULoxetine   30 mg Oral Daily   enoxaparin  (LOVENOX ) injection  40 mg Subcutaneous Q24H   gabapentin   300 mg Oral BID   hydrochlorothiazide   25 mg Oral Daily   losartan   100 mg Oral Daily   metoprolol  succinate  50 mg Oral Daily   montelukast   10 mg Oral QHS   nicotine   21 mg Transdermal Daily   pantoprazole   40 mg Oral Daily   Continuous Infusions:  sodium chloride  50 mL/hr at 09/21/23 1810   PRN Meds:.acetaminophen  **OR** acetaminophen , albuterol ,  HYDROmorphone  (DILAUDID ) injection, oxyCODONE , prochlorperazine , tiZANidine   Consultants: None  Procedures: None  Antibiotics: None  Assessment/Plan: Principal Problem:   Sickle cell crisis (HCC) Active Problems:   Essential hypertension   Chronic prescription opiate use   GERD (gastroesophageal reflux disease)   Mild intermittent asthma without complication  Hb Sickle Cell Disease with Pain crisis: Continue IVF 0.45% Saline @ 50 mls/hour, no PCA because of positive cocaine  screen, give IV Dilaudid  1 mg every 3 hours as needed breakthrough pain.  Continue IV Toradol  15 mg Q 6 H for a total of 5 days, continue oral home pain medications as ordered. Monitor vitals very closely, Re-evaluate pain scale regularly, 2 L of Oxygen by [place]. Patient encouraged to ambulate on the hallway today.  Anemia of Chronic Disease: Hemoglobin is stable at baseline today.  There is no clinical indication for blood transfusion at this time.  Will continue to monitor closely and transfuse as appropriate. Chronic pain Syndrome: Continue oral home pain medications as ordered. Essential hypertension: Continue home medication.  Monitor blood pressure very closely. Cocaine  use disorder: UDS positive for cocaine .  Patient counseled extensively about substance use.  Patient offered resources for rehab.  We will refrain from IV Dilaudid  via PCA during this admission as a result. Tobacco use disorder: Dean Webb was counseled on the dangers of tobacco use, and was advised to quit. Reviewed strategies to maximize success, including removing cigarettes and smoking materials from environment, stress management and support of family/friends.   Code Status: Full Code Family Communication: N/A Disposition Plan: Not yet ready for discharge  Dean Webb  If 7PM-7AM, please contact night-coverage.  09/21/2023, 6:21 PM  LOS: 0 days

## 2023-09-22 DIAGNOSIS — D5700 Hb-SS disease with crisis, unspecified: Secondary | ICD-10-CM | POA: Diagnosis not present

## 2023-09-22 LAB — BASIC METABOLIC PANEL WITH GFR
Anion gap: 11 (ref 5–15)
BUN: 10 mg/dL (ref 6–20)
CO2: 24 mmol/L (ref 22–32)
Calcium: 10.9 mg/dL — ABNORMAL HIGH (ref 8.9–10.3)
Chloride: 98 mmol/L (ref 98–111)
Creatinine, Ser: 1.06 mg/dL (ref 0.61–1.24)
GFR, Estimated: >60 mL/min (ref >60)
Glucose, Bld: 95 mg/dL (ref 70–99)
Potassium: 3.6 mmol/L (ref 3.5–5.1)
Sodium: 133 mmol/L — ABNORMAL LOW (ref 135–145)

## 2023-09-22 LAB — CBC WITH DIFFERENTIAL/PLATELET
Abs Immature Granulocytes: 0.03 K/uL (ref 0.00–0.07)
Basophils Absolute: 0.1 K/uL (ref 0.0–0.1)
Basophils Relative: 1 %
Eosinophils Absolute: 0.3 K/uL (ref 0.0–0.5)
Eosinophils Relative: 4 %
HCT: 38.3 % — ABNORMAL LOW (ref 39.0–52.0)
Hemoglobin: 14 g/dL (ref 13.0–17.0)
Immature Granulocytes: 0 %
Lymphocytes Relative: 35 %
Lymphs Abs: 3.1 K/uL (ref 0.7–4.0)
MCH: 29.3 pg (ref 26.0–34.0)
MCHC: 36.6 g/dL — ABNORMAL HIGH (ref 30.0–36.0)
MCV: 80.1 fL (ref 80.0–100.0)
Monocytes Absolute: 1.4 K/uL — ABNORMAL HIGH (ref 0.1–1.0)
Monocytes Relative: 15 %
Neutro Abs: 4 K/uL (ref 1.7–7.7)
Neutrophils Relative %: 45 %
Platelets: 336 K/uL (ref 150–400)
RBC: 4.78 MIL/uL (ref 4.22–5.81)
RDW: 14.5 % (ref 11.5–15.5)
WBC: 8.9 K/uL (ref 4.0–10.5)
nRBC: 2.4 % — ABNORMAL HIGH (ref 0.0–0.2)

## 2023-09-22 MED ORDER — CARMEX CLASSIC LIP BALM EX OINT
1.0000 | TOPICAL_OINTMENT | CUTANEOUS | Status: DC | PRN
  Administered 2023-09-22: 1 via TOPICAL
  Filled 2023-09-22: qty 10

## 2023-09-22 MED ORDER — POLYETHYLENE GLYCOL 3350 17 G PO PACK
17.0000 g | PACK | Freq: Every day | ORAL | Status: DC | PRN
  Administered 2023-09-23: 17 g via ORAL
  Filled 2023-09-22: qty 1

## 2023-09-22 NOTE — Plan of Care (Signed)

## 2023-09-22 NOTE — Progress Notes (Signed)
 SICKLE CELL SERVICE PROGRESS NOTE  Dean Webb FMW:[id number] DOB: [birth year] DOA: 09/20/2023 PCP: Oley Bascom RAMAN, NP  Assessment/Plan: Principal Problem:   Sickle cell crisis [facility]) Active Problems:   Essential hypertension   Chronic prescription opiate use   GERD (gastroesophageal reflux disease)   Mild intermittent asthma without complication  Sickle cell pain crisis: Patient continues to complain of pain.  He is currently on Dilaudid  1 mg IV every 3 hours as needed.  Patient was not given PCA due to cocaine  abuse.  He is also on Toradol  as well as IV fluids.  Pain has slightly improved palpation.  At this point we will continue titrating pain management. Chronic pain syndrome: Resume home regimen Anemia of chronic disease: Hemoglobin appears stable.  No requirement for transfusion in the moment. Essential hypertension: Blood pressure is controlled.  Continue to monitor Cocaine  use disorder: Counseling provided. Tobacco abuse: Counseling provided.  Patient also offered nicotine  patch.  Code Status: Full code Family Communication: No family at bedside Disposition Plan: Home when stable  [facility]  Pager [phone]. If 7PM-7AM, please contact night-coverage.  09/22/2023, 10:38 AM  LOS: 1 day   Brief narrative: Dean Webb is a 48 year old male with a medical history significant for sickle cell disease, chronic pain syndrome, opiate dependence and tolerance, and history of anemia of chronic disease was admitted for sickle cell pain crisis.   Consultants: None  Procedures: Chest x-ray  Antibiotics: None  HPI/Subjective: Patient is complaining of 8 out of 10 pain.  He is getting his Dilaudid  on schedule.  No other significant complaint otherwise.  Objective: Vitals:   09/21/23 2035 09/22/23 0144 09/22/23 0543 09/22/23 0951  BP: 136/86 (!) 145/88 (!) 153/109 130/74  Pulse: (!) 53 (!) 59 (!) 55 (!) 55  Resp: 14 14 14 20   Temp: 97.7 F (36.5 C) 98.4 F (36.9 C)  98.4 F (36.9 C) 97.8 F (36.6 C)  TempSrc: Oral Oral Oral Oral  SpO2: 100% 97% 98% 99%  Weight:      Height:       Weight change:   Intake/Output Summary (Last 24 hours) at 09/22/2023 1038 Last data filed at 09/22/2023 0945 Gross per 24 hour  Intake 911.17 ml  Output 2150 ml  Net -1238.83 ml    General: Alert, awake, oriented x3, in no acute distress.  HEENT: [place]/AT [place]ER[place], EOMI Neck: Trachea midline,  no masses, no thyromegal,y no JVD, no carotid bruit OROPHARYNX:  Moist, No exudate/ erythema/lesions.  Heart: Regular rate and rhythm, without murmurs, rubs, gallops, PMI non-displaced, no heaves or thrills on palpation.  Lungs: Clear to auscultation, no wheezing or rhonchi noted. No increased vocal fremitus resonant to percussion  Abdomen: Soft, nontender, nondistended, positive bowel sounds, no masses no hepatosplenomegaly noted..  Neuro: No focal neurological deficits noted cranial nerves II through XII grossly intact. DTRs 2+ bilaterally upper and lower extremities. Strength 5 out of 5 in bilateral upper and lower extremities. Musculoskeletal: No warm swelling or erythema around joints, no spinal tenderness noted. Psychiatric: Patient alert and oriented x3, good insight and cognition, good recent to remote recall. Lymph node survey: No cervical axillary or inguinal lymphadenopathy noted.   Data Reviewed: Basic Metabolic Panel: Recent Labs  Lab 09/20/23 1629 09/22/23 0712  NA 137 133*  K 4.2 3.6  CL 105 98  CO2 23 24  GLUCOSE 94 95  BUN 11 10  CREATININE 1.12 1.06  CALCIUM 10.9* 10.9*   Liver Function Tests: Recent Labs  Lab 09/20/23 1629  AST 23  ALT 16  ALKPHOS 88  BILITOT 1.6*  PROT 8.1  ALBUMIN 4.3   No results for input(s): LIPASE, AMYLASE in the last 168 hours. No results for input(s): AMMONIA in the last 168 hours. CBC: Recent Labs  Lab 09/20/23 1629 09/21/23 0454 09/22/23 0712  WBC 9.7 10.7* 8.9  NEUTROABS 3.9  --  4.0  HGB 14.9 13.9 14.0   HCT 40.4 37.8* 38.3*  MCV 79.8* 81.1 80.1  PLT 390 297 336   Cardiac Enzymes: Recent Labs  Lab 09/21/23 0454  CKTOTAL 500*   BNP (last 3 results) No results for input(s): BNP in the last 8760 hours.  ProBNP (last 3 results) No results for input(s): PROBNP in the last 8760 hours.  CBG: No results for input(s): GLUCAP in the last 168 hours.  No results found for this or any previous visit (from the past 240 hours).   Studies: No results found.  Scheduled Meds:  amLODipine   10 mg Oral Daily   cloNIDine   0.3 mg Oral Daily   DULoxetine   30 mg Oral Daily   enoxaparin  (LOVENOX ) injection  40 mg Subcutaneous Q24H   gabapentin   300 mg Oral BID   hydrochlorothiazide   25 mg Oral Daily   ketorolac   15 mg Intravenous Q6H   losartan   100 mg Oral Daily   metoprolol  succinate  50 mg Oral Daily   montelukast   10 mg Oral QHS   nicotine   21 mg Transdermal Daily   pantoprazole   40 mg Oral Daily   Continuous Infusions:  sodium chloride  50 mL/hr at 09/22/23 0747    Principal Problem:   Sickle cell crisis (HCC) Active Problems:   Essential hypertension   Chronic prescription opiate use   GERD (gastroesophageal reflux disease)   Mild intermittent asthma without complication

## 2023-09-23 DIAGNOSIS — D5700 Hb-SS disease with crisis, unspecified: Secondary | ICD-10-CM | POA: Diagnosis not present

## 2023-09-23 MED ORDER — LACTULOSE 10 GM/15ML PO SOLN
20.0000 g | Freq: Two times a day (BID) | ORAL | Status: DC
  Administered 2023-09-23 – 2023-09-27 (×13): 20 g via ORAL
  Filled 2023-09-23 (×9): qty 30

## 2023-09-23 NOTE — Progress Notes (Signed)
 SICKLE CELL SERVICE PROGRESS NOTE  Dean Webb Fix FMW:[id number] DOB: Feb 28, 1975 DOA: 09/20/2023 PCP: Oley Bascom RAMAN, NP  Assessment/Plan: Principal Problem:   Sickle cell crisis [facility]) Active Problems:   Essential hypertension   Chronic prescription opiate use   GERD (gastroesophageal reflux disease)   Mild intermittent asthma without complication  Sickle cell pain crisis: Patient continues to complain of pain. He also has constipation. He is currently on Dilaudid  1 mg IV every 3 hours as needed.  Patient was not given PCA due to cocaine  abuse.  He is also on Toradol  as well as IV fluids.  Pain has slightly improved palpation.  At this point we will continue titrating pain management. Chronic pain syndrome: Resume home regimen Anemia of chronic disease: Hemoglobin appears stable.  No requirement for transfusion in the moment. Essential hypertension: Blood pressure is controlled.  Continue to monitor Cocaine  use disorder: Counseling provided. Tobacco abuse: Counseling provided.  Patient also offered nicotine  patch. Constipation: Will add Lactulose   Code Status: Full code Family Communication: No family at bedside Disposition Plan: Home when stable  [facility]  Pager [phone]. If 7PM-7AM, please contact night-coverage.  09/23/2023, 6:23 AM  LOS: 2 days   Brief narrative: Dean Webb is a 48 year old male with a medical history significant for sickle cell disease, chronic pain syndrome, opiate dependence and tolerance, and history of anemia of chronic disease was admitted for sickle cell pain crisis.   Consultants: None  Procedures: Chest x-ray  Antibiotics: None  HPI/Subjective: Patient is complaining of 7 out of 10 pain.  He is getting his Dilaudid  on schedule.  Complained of Constipation.  Objective: Vitals:   09/22/23 1748 09/22/23 2047 09/23/23 0230 09/23/23 0546  BP: 106/67 124/78 107/80 (!) 140/84  Pulse: 62 (!) 50 (!) 51 (!) 57  Resp: 20  16 18   Temp:  97.7 F (36.5 C) 98.3 F (36.8 C) 98.4 F (36.9 C) 98.4 F (36.9 C)  TempSrc: Oral Oral Oral Oral  SpO2: 98% 100% 99% 98%  Weight:      Height:       Weight change:   Intake/Output Summary (Last 24 hours) at 09/23/2023 0623 Last data filed at 09/23/2023 0538 Gross per 24 hour  Intake 938.83 ml  Output 1100 ml  Net -161.17 ml    General: Alert, awake, oriented x3, in no acute distress.  HEENT: [place]/AT [place], EOMI Neck: Trachea midline,  no masses, no thyromegal,y no JVD, no carotid bruit OROPHARYNX:  Moist, No exudate/ erythema/lesions.  Heart: Regular rate and rhythm, without murmurs, rubs, gallops, PMI non-displaced, no heaves or thrills on palpation.  Lungs: Clear to auscultation, no wheezing or rhonchi noted. No increased vocal fremitus resonant to percussion  Abdomen: Soft, nontender, nondistended, positive bowel sounds, no masses no hepatosplenomegaly noted..  Neuro: No focal neurological deficits noted cranial nerves II through XII grossly intact. DTRs 2+ bilaterally upper and lower extremities. Strength 5 out of 5 in bilateral upper and lower extremities. Musculoskeletal: No warm swelling or erythema around joints, no spinal tenderness noted. Psychiatric: Patient alert and oriented x3, good insight and cognition, good recent to remote recall. Lymph node survey: No cervical axillary or inguinal lymphadenopathy noted.   Data Reviewed: Basic Metabolic Panel: Recent Labs  Lab 09/20/23 1629 09/22/23 0712  NA 137 133*  K 4.2 3.6  CL 105 98  CO2 23 24  GLUCOSE 94 95  BUN 11 10  CREATININE 1.12 1.06  CALCIUM 10.9* 10.9*   Liver Function Tests: Recent Labs  Lab 09/20/23 1629  AST 23  ALT 16  ALKPHOS 88  BILITOT 1.6*  PROT 8.1  ALBUMIN 4.3   No results for input(s): LIPASE, AMYLASE in the last 168 hours. No results for input(s): AMMONIA in the last 168 hours. CBC: Recent Labs  Lab 09/20/23 1629 09/21/23 0454 09/22/23 0712  WBC 9.7 10.7* 8.9   NEUTROABS 3.9  --  4.0  HGB 14.9 13.9 14.0  HCT 40.4 37.8* 38.3*  MCV 79.8* 81.1 80.1  PLT 390 297 336   Cardiac Enzymes: Recent Labs  Lab 09/21/23 0454  CKTOTAL 500*   BNP (last 3 results) No results for input(s): BNP in the last 8760 hours.  ProBNP (last 3 results) No results for input(s): PROBNP in the last 8760 hours.  CBG: No results for input(s): GLUCAP in the last 168 hours.  No results found for this or any previous visit (from the past 240 hours).   Studies: No results found.  Scheduled Meds:  amLODipine   10 mg Oral Daily   cloNIDine   0.3 mg Oral Daily   DULoxetine   30 mg Oral Daily   enoxaparin  (LOVENOX ) injection  40 mg Subcutaneous Q24H   gabapentin   300 mg Oral BID   hydrochlorothiazide   25 mg Oral Daily   ketorolac   15 mg Intravenous Q6H   losartan   100 mg Oral Daily   metoprolol  succinate  50 mg Oral Daily   montelukast   10 mg Oral QHS   nicotine   21 mg Transdermal Daily   pantoprazole   40 mg Oral Daily   Continuous Infusions:    Principal Problem:   Sickle cell crisis (HCC) Active Problems:   Essential hypertension   Chronic prescription opiate use   GERD (gastroesophageal reflux disease)   Mild intermittent asthma without complication

## 2023-09-23 NOTE — Plan of Care (Signed)

## 2023-09-24 NOTE — Progress Notes (Signed)
 Patient ID: Dean Webb, male   DOB: November 11, 1975, 48 y.o.   MRN: [id number] Subjective: Dean Webb is a 48 y.o. male with medical history significant of sickle cell disease, mild intermittent asthma, GERD, hypertension, cocaine  abuse, ongoing smoker presents to the emergency room with about 2 days of bilateral thigh pain, 10/10 no radiation, no swelling.   This morning patient is endorsing pain of 8/10, slight improvement from admission. He is requesting Dilaudid  PCA however due to his positive UDS for cocaine , PCA will not be ordered at this time.   Objective:  Vital signs in last 24 hours:  Vitals:   09/24/23 0937 09/24/23 1353 09/24/23 1720 09/24/23 2136  BP: 123/86 111/82 106/63 111/73  Pulse: (!) 53 (!) 48 (!) 50 (!) 53  Resp: 16 16 16 17   Temp: 97.9 F (36.6 C) 97.6 F (36.4 C) 98.2 F (36.8 C) 97.7 F (36.5 C)  TempSrc: Oral Oral  Oral  SpO2: 98% 96% 95% 97%  Weight:      Height:        Intake/Output from previous day:   Intake/Output Summary (Last 24 hours) at 09/24/2023 2333 Last data filed at 09/24/2023 2136 Gross per 24 hour  Intake 1200 ml  Output 650 ml  Net 550 ml    Physical Exam: General: Alert, awake, oriented x3, in no acute distress.  HEENT: [place]/AT [place], EOMI Neck: Trachea midline,  no masses, no thyromegal,y no JVD, no carotid bruit OROPHARYNX:  Moist, No exudate/ erythema/lesions.  Heart: Regular rate and rhythm, without murmurs, rubs, gallops, PMI non-displaced, no heaves or thrills on palpation.  Lungs: Clear to auscultation, no wheezing or rhonchi noted. No increased vocal fremitus resonant to percussion  Abdomen: Soft, nontender, nondistended, positive bowel sounds, no masses no hepatosplenomegaly noted..  Neuro: No focal neurological deficits noted cranial nerves II through XII grossly intact. DTRs 2+ bilaterally upper and lower extremities. Strength 5 out of 5 in bilateral upper and lower extremities. Musculoskeletal:Generalize body  tenderness Psychiatric: Patient alert and oriented x3, good insight and cognition, good recent to remote recall. Lymph node survey: No cervical axillary or inguinal lymphadenopathy noted.  Lab Results:  Basic Metabolic Panel:    Component Value Date/Time   NA 133 (L) 09/22/2023 0712   NA 135 11/09/2022 1416   K 3.6 09/22/2023 0712   CL 98 09/22/2023 0712   CO2 24 09/22/2023 0712   BUN 10 09/22/2023 0712   BUN 13 11/09/2022 1416   CREATININE 1.06 09/22/2023 0712   GLUCOSE 95 09/22/2023 0712   CALCIUM 10.9 (H) 09/22/2023 0712   CBC:    Component Value Date/Time   WBC 8.9 09/22/2023 0712   HGB 14.0 09/22/2023 0712   HGB 14.4 11/09/2022 1416   HCT 38.3 (L) 09/22/2023 0712   HCT 41.0 11/09/2022 1416   PLT 336 09/22/2023 0712   PLT 303 11/09/2022 1416   MCV 80.1 09/22/2023 0712   MCV 88 11/09/2022 1416   NEUTROABS 4.0 09/22/2023 0712   NEUTROABS 6.7 11/09/2022 1416   LYMPHSABS 3.1 09/22/2023 0712   LYMPHSABS 4.2 (H) 11/09/2022 1416   MONOABS 1.4 (H) 09/22/2023 0712   EOSABS 0.3 09/22/2023 0712   EOSABS 0.4 11/09/2022 1416   BASOSABS 0.1 09/22/2023 0712   BASOSABS 0.1 11/09/2022 1416    No results found for this or any previous visit (from the past 240 hours).  Studies/Results: No results found.  Medications: Scheduled Meds:  amLODipine   10 mg Oral Daily   cloNIDine   0.3  mg Oral Daily   DULoxetine   30 mg Oral Daily   enoxaparin  (LOVENOX ) injection  40 mg Subcutaneous Q24H   gabapentin   300 mg Oral BID   hydrochlorothiazide   25 mg Oral Daily   ketorolac   15 mg Intravenous Q6H   lactulose   20 g Oral BID   losartan   100 mg Oral Daily   metoprolol  succinate  50 mg Oral Daily   montelukast   10 mg Oral QHS   nicotine   21 mg Transdermal Daily   pantoprazole   40 mg Oral Daily   Continuous Infusions: PRN Meds:.acetaminophen  **OR** acetaminophen , albuterol , HYDROmorphone  (DILAUDID ) injection, lip balm, oxyCODONE , polyethylene glycol, prochlorperazine ,  tiZANidine   Consultants: None  Procedures: None  Antibiotics: None  Assessment/Plan: Principal Problem:   Sickle cell crisis (HCC) Active Problems:   Essential hypertension   Chronic prescription opiate use   GERD (gastroesophageal reflux disease)   Mild intermittent asthma without complication   Hb Sickle Cell Disease with Pain crisis: Continue IVF 0.45% Saline @ KVO,  Dilaudid  1 mg every 3 hours as needed for pain. IV Toradol  15 mg Q 6 H for a total of 5 days, continue oral home pain medications as ordered. Monitor vitals very closely, Re-evaluate pain scale regularly, 2 L of Oxygen by [place]. Patient encouraged to ambulate on the hallway today.  Leukocytosis:  Anemia of Chronic Disease: Hemoglobin stable. No requirement for transfusion in the moment.  Chronic pain Syndrome: Resume home regimen  Constipation: continue Lactulose  as needed Essential hypertension: Blood pressure is controlled.  Continue to monitor  Code Status: Full Code Family Communication: N/A Disposition Plan: Not yet ready for discharge  Homer CHRISTELLA Cover NP  If 7PM-7AM, please contact night-coverage.  09/24/2023, 11:33 PM  LOS: 3 days

## 2023-09-25 NOTE — Plan of Care (Signed)

## 2023-09-25 NOTE — Progress Notes (Signed)
 Patient ID: Dean Webb, male   DOB: Aug 11, 1975, 48 y.o.   MRN: [id number] Subjective: Dean Webb is a 48 y.o. male with medical history significant of sickle cell disease, mild intermittent asthma, GERD, hypertension, cocaine  abuse, ongoing smoker presents to the emergency room with about 2 days of bilateral thigh pain, 10/10 no radiation, no swelling.   This morning patient is endorsing pain of 7/10, slight improvement from yesterday. No new concerns. Denies fever, cough, SOB, N/V/D. No urinary symptoms.  Objective:  Vital signs in last 24 hours:  Vitals:   09/24/23 2136 09/25/23 0117 09/25/23 0559 09/25/23 1001  BP: 111/73 117/75 116/66 119/65  Pulse: (!) 53 (!) 55 (!) 54 (!) 56  Resp: 17 18 18 20   Temp: 97.7 F (36.5 C) 98.1 F (36.7 C) 98 F (36.7 C) 97.8 F (36.6 C)  TempSrc: Oral Oral Oral Oral  SpO2: 97% 97% 99% 100%  Weight:      Height:        Intake/Output from previous day:   Intake/Output Summary (Last 24 hours) at 09/25/2023 1113 Last data filed at 09/25/2023 1022 Gross per 24 hour  Intake 840 ml  Output 1250 ml  Net -410 ml    Physical Exam: General: Alert, awake, oriented x3, in no acute distress.  HEENT: [place]/AT [place], EOMI Neck: Trachea midline,  no masses, no thyromegal,y no JVD, no carotid bruit OROPHARYNX:  Moist, No exudate/ erythema/lesions.  Heart: Regular rate and rhythm, without murmurs, rubs, gallops, PMI non-displaced, no heaves or thrills on palpation.  Lungs: Clear to auscultation, no wheezing or rhonchi noted. No increased vocal fremitus resonant to percussion  Abdomen: Soft, nontender, nondistended, positive bowel sounds, no masses no hepatosplenomegaly noted..  Neuro: No focal neurological deficits noted cranial nerves II through XII grossly intact. DTRs 2+ bilaterally upper and lower extremities. Strength 5 out of 5 in bilateral upper and lower extremities. Musculoskeletal:Generalize body tenderness Psychiatric: Patient alert and  oriented x3, good insight and cognition, good recent to remote recall. Lymph node survey: No cervical axillary or inguinal lymphadenopathy noted.  Lab Results:  Basic Metabolic Panel:    Component Value Date/Time   NA 133 (L) 09/22/2023 0712   NA 135 11/09/2022 1416   K 3.6 09/22/2023 0712   CL 98 09/22/2023 0712   CO2 24 09/22/2023 0712   BUN 10 09/22/2023 0712   BUN 13 11/09/2022 1416   CREATININE 1.06 09/22/2023 0712   GLUCOSE 95 09/22/2023 0712   CALCIUM 10.9 (H) 09/22/2023 0712   CBC:    Component Value Date/Time   WBC 8.9 09/22/2023 0712   HGB 14.0 09/22/2023 0712   HGB 14.4 11/09/2022 1416   HCT 38.3 (L) 09/22/2023 0712   HCT 41.0 11/09/2022 1416   PLT 336 09/22/2023 0712   PLT 303 11/09/2022 1416   MCV 80.1 09/22/2023 0712   MCV 88 11/09/2022 1416   NEUTROABS 4.0 09/22/2023 0712   NEUTROABS 6.7 11/09/2022 1416   LYMPHSABS 3.1 09/22/2023 0712   LYMPHSABS 4.2 (H) 11/09/2022 1416   MONOABS 1.4 (H) 09/22/2023 0712   EOSABS 0.3 09/22/2023 0712   EOSABS 0.4 11/09/2022 1416   BASOSABS 0.1 09/22/2023 0712   BASOSABS 0.1 11/09/2022 1416    No results found for this or any previous visit (from the past 240 hours).  Studies/Results: No results found.  Medications: Scheduled Meds:  amLODipine   10 mg Oral Daily   cloNIDine   0.3 mg Oral Daily   DULoxetine   30 mg Oral Daily  enoxaparin  (LOVENOX ) injection  40 mg Subcutaneous Q24H   gabapentin   300 mg Oral BID   hydrochlorothiazide   25 mg Oral Daily   ketorolac   15 mg Intravenous Q6H   lactulose   20 g Oral BID   losartan   100 mg Oral Daily   metoprolol  succinate  50 mg Oral Daily   montelukast   10 mg Oral QHS   nicotine   21 mg Transdermal Daily   pantoprazole   40 mg Oral Daily   Continuous Infusions: PRN Meds:.acetaminophen  **OR** acetaminophen , albuterol , HYDROmorphone  (DILAUDID ) injection, lip balm, oxyCODONE , polyethylene glycol, prochlorperazine ,  tiZANidine   Consultants: None  Procedures: None  Antibiotics: None  Assessment/Plan: Principal Problem:   Sickle cell crisis (HCC) Active Problems:   Essential hypertension   Chronic prescription opiate use   GERD (gastroesophageal reflux disease)   Mild intermittent asthma without complication   Hb Sickle Cell Disease with Pain crisis: Continue IVF 0.45% Saline @ KVO,  Dilaudid  1 mg every 3 hours as needed for pain. IV Toradol  15 mg Q 6 H for a total of 5 days, continue oral home pain medications as ordered. Monitor vitals very closely, Re-evaluate pain scale regularly, 2 L of Oxygen by [place]. Patient encouraged to ambulate on the hallway today.  Leukocytosis:  Anemia of Chronic Disease: Hemoglobin is within patients baseline, no medical reason  for transfusion, will continue to monitor daily CBC. Chronic pain Syndrome: Resume home regimen  Constipation: continue Lactulose  as needed Essential hypertension: Blood pressure is controlled.  Continue to monitor  Code Status: Full Code Family Communication: N/A Disposition Plan: Not yet ready for discharge  Homer CHRISTELLA Cover NP  If 7PM-7AM, please contact night-coverage.  09/25/2023, 11:13 AM  LOS: 4 days

## 2023-09-25 NOTE — Plan of Care (Signed)

## 2023-09-26 LAB — CBC
HCT: 33.9 % — ABNORMAL LOW (ref 39.0–52.0)
Hemoglobin: 12.5 g/dL — ABNORMAL LOW (ref 13.0–17.0)
MCH: 29.9 pg (ref 26.0–34.0)
MCHC: 36.9 g/dL — ABNORMAL HIGH (ref 30.0–36.0)
MCV: 81.1 fL (ref 80.0–100.0)
Platelets: 262 K/uL (ref 150–400)
RBC: 4.18 MIL/uL — ABNORMAL LOW (ref 4.22–5.81)
RDW: 14.5 % (ref 11.5–15.5)
WBC: 7.9 K/uL (ref 4.0–10.5)
nRBC: 2.1 % — ABNORMAL HIGH (ref 0.0–0.2)

## 2023-09-26 NOTE — Plan of Care (Signed)

## 2023-09-26 NOTE — Progress Notes (Addendum)
 Patient ID: Dean Webb, male   DOB: October 14, 1975, 48 y.o.   MRN: [id number] Subjective: Dean Webb is a 48 y.o. male with medical history significant of sickle cell disease, mild intermittent asthma, GERD, hypertension, cocaine  abuse, ongoing smoker presents to the emergency room with about 2 days of bilateral thigh pain, 10/10 no radiation, no swelling.   Patient is endorsing pain of 6/10, slight improvement from yesterday. No new concerns. Denies fever, cough, SOB, N/V/D. No urinary symptoms.  Objective:  Vital signs in last 24 hours:  Vitals:   09/25/23 1829 09/25/23 2151 09/26/23 0617 09/26/23 0900  BP: (!) 102/59 103/78 122/81 109/72  Pulse: (!) 53 (!) 45 (!) 55 (!) 58  Resp: 20 18 20 15   Temp: 98.1 F (36.7 C) 97.9 F (36.6 C) 97.8 F (36.6 C) 98.4 F (36.9 C)  TempSrc: Oral Oral Oral Oral  SpO2: 100% 100% 100% 99%  Weight:      Height:        Intake/Output from previous day:   Intake/Output Summary (Last 24 hours) at 09/26/2023 1321 Last data filed at 09/26/2023 0900 Gross per 24 hour  Intake 240 ml  Output --  Net 240 ml    Physical Exam: General: Alert, awake, oriented x3, in no acute distress.  HEENT: [place]/AT [place], EOMI Neck: Trachea midline,  no masses, no thyromegal,y no JVD, no carotid bruit OROPHARYNX:  Moist, No exudate/ erythema/lesions.  Heart: Regular rate and rhythm, without murmurs, rubs, gallops, PMI non-displaced, no heaves or thrills on palpation.  Lungs: Clear to auscultation, no wheezing or rhonchi noted. No increased vocal fremitus resonant to percussion  Abdomen: Soft, nontender, nondistended, positive bowel sounds, no masses no hepatosplenomegaly noted..  Neuro: No focal neurological deficits noted cranial nerves II through XII grossly intact. DTRs 2+ bilaterally upper and lower extremities. Strength 5 out of 5 in bilateral upper and lower extremities. Musculoskeletal:Generalize body tenderness Psychiatric: Patient alert and oriented x3,  good insight and cognition, good recent to remote recall. Lymph node survey: No cervical axillary or inguinal lymphadenopathy noted.  Lab Results:  Basic Metabolic Panel:    Component Value Date/Time   NA 133 (L) 09/22/2023 0712   NA 135 11/09/2022 1416   K 3.6 09/22/2023 0712   CL 98 09/22/2023 0712   CO2 24 09/22/2023 0712   BUN 10 09/22/2023 0712   BUN 13 11/09/2022 1416   CREATININE 1.06 09/22/2023 0712   GLUCOSE 95 09/22/2023 0712   CALCIUM 10.9 (H) 09/22/2023 0712   CBC:    Component Value Date/Time   WBC 7.9 09/26/2023 0535   HGB 12.5 (L) 09/26/2023 0535   HGB 14.4 11/09/2022 1416   HCT 33.9 (L) 09/26/2023 0535   HCT 41.0 11/09/2022 1416   PLT 262 09/26/2023 0535   PLT 303 11/09/2022 1416   MCV 81.1 09/26/2023 0535   MCV 88 11/09/2022 1416   NEUTROABS 4.0 09/22/2023 0712   NEUTROABS 6.7 11/09/2022 1416   LYMPHSABS 3.1 09/22/2023 0712   LYMPHSABS 4.2 (H) 11/09/2022 1416   MONOABS 1.4 (H) 09/22/2023 0712   EOSABS 0.3 09/22/2023 0712   EOSABS 0.4 11/09/2022 1416   BASOSABS 0.1 09/22/2023 0712   BASOSABS 0.1 11/09/2022 1416    No results found for this or any previous visit (from the past 240 hours).  Studies/Results: No results found.  Medications: Scheduled Meds:  amLODipine   10 mg Oral Daily   cloNIDine   0.3 mg Oral Daily   DULoxetine   30 mg Oral Daily  enoxaparin  (LOVENOX ) injection  40 mg Subcutaneous Q24H   gabapentin   300 mg Oral BID   hydrochlorothiazide   25 mg Oral Daily   lactulose   20 g Oral BID   losartan   100 mg Oral Daily   metoprolol  succinate  50 mg Oral Daily   montelukast   10 mg Oral QHS   nicotine   21 mg Transdermal Daily   pantoprazole   40 mg Oral Daily   Continuous Infusions: PRN Meds:.acetaminophen  **OR** acetaminophen , albuterol , HYDROmorphone  (DILAUDID ) injection, lip balm, oxyCODONE , polyethylene glycol, prochlorperazine ,  tiZANidine   Consultants: None  Procedures: None  Antibiotics: None  Assessment/Plan: Principal Problem:   Sickle cell crisis (HCC) Active Problems:   Essential hypertension   Chronic prescription opiate use   GERD (gastroesophageal reflux disease)   Mild intermittent asthma without complication   Hb Sickle Cell Disease with Pain crisis: Continue IVF 0.45% Saline @ KVO,  Dilaudid  1 mg every 3 hours as needed for pain. IV Toradol  15 mg Q 6 H for a total of 5 days, continue oral home pain medications as ordered. Monitor vitals very closely, Re-evaluate pain scale regularly, 2 L of Oxygen by [place]. Patient encouraged to ambulate on the hallway today.  Leukocytosis: Stable Anemia of Chronic Disease: Hemoglobin is within patients baseline, no medical reason  for transfusion, will continue to monitor daily CBC. Chronic pain Syndrome: Resume home regimen  Constipation: continue Lactulose  as needed Essential hypertension: Blood pressure is controlled.  Continue to monitor  Code Status: Full Code Family Communication: N/A Disposition Plan: Ready for discharge in the a.m. Homer CHRISTELLA Cover NP  If 7PM-7AM, please contact night-coverage.  09/26/2023, 1:21 PM  LOS: 5 days

## 2023-09-27 ENCOUNTER — Telehealth: Payer: Self-pay | Admitting: Nurse Practitioner

## 2023-09-27 LAB — CBC
HCT: 36.9 % — ABNORMAL LOW (ref 39.0–52.0)
Hemoglobin: 13.4 g/dL (ref 13.0–17.0)
MCH: 29 pg (ref 26.0–34.0)
MCHC: 36.3 g/dL — ABNORMAL HIGH (ref 30.0–36.0)
MCV: 79.9 fL — ABNORMAL LOW (ref 80.0–100.0)
Platelets: 315 K/uL (ref 150–400)
RBC: 4.62 MIL/uL (ref 4.22–5.81)
RDW: 14.3 % (ref 11.5–15.5)
WBC: 9.5 K/uL (ref 4.0–10.5)
nRBC: 1.6 % — ABNORMAL HIGH (ref 0.0–0.2)

## 2023-09-27 NOTE — Telephone Encounter (Unsigned)
 Copied from CRM [id number]. Topic: Clinical - Medication Refill >> Sep 27, 2023 10:38 AM Marissa P wrote: Medication: oxyCODONE  (Oxy IR/ROXICODONE ) immediate release tablet 10 mg  Has the patient contacted their pharmacy? No (Agent: If no, request that the patient contact the pharmacy for the refill. If patient does not wish to contact the pharmacy document the reason why and proceed with request.) (Agent: If yes, when and what did the pharmacy advise?)  This is the patient's preferred pharmacy:  [facility] [phone] [address], [place] - [address] [place] [address] [place] [address] Phone: [phone] Fax: [phone]  Is this the correct pharmacy for this prescription? Yes If no, delete pharmacy and type the correct one.   Has the prescription been filled recently? Yes  Is the patient out of the medication? Yes  Has the patient been seen for an appointment in the last year OR does the patient have an upcoming appointment? No  Can we respond through MyChart? Yes  Agent: Please be advised that Rx refills may take up to 3 business days. We ask that you follow-up with your pharmacy.

## 2023-09-27 NOTE — Discharge Summary (Signed)
 Physician Discharge Summary  Dean Webb FMW:[id number] DOB: 05-16-1975 DOA: 09/20/2023  PCP: Oley Bascom RAMAN, NP  Admit date: 09/20/2023  Discharge date: 09/27/2023  Discharge Diagnoses:  Principal Problem:   Sickle cell crisis (HCC) Active Problems:   Essential hypertension   Chronic prescription opiate use   GERD (gastroesophageal reflux disease)   Mild intermittent asthma without complication   Discharge Condition: Stable  Disposition:  Pt is discharged home in good condition and is to follow up with Oley Bascom RAMAN, NP this week to have labs evaluated. Nancyann SAUNDERS [place] is instructed to increase activity slowly and balance with rest for the next few days, and use prescribed medication to complete treatment of pain  Diet: Regular Wt Readings from Last 3 Encounters:  09/20/23 90.7 kg  04/02/23 88.6 kg  11/23/22 96.6 kg    History of present illness:  Dean Webb is a 48 year old male with a medical history significant for sickle cell disease, chronic pain syndrome, opiate dependence and tolerance, and history of anemia of chronic disease, mild intermittent asthma, GERD, hypertension, cocaine  abuse, ongoing smoker who presented to the emergency room with about 2 days of bilateral thigh pain, 10/10 no radiation, no swelling. Denies fever, SOB, headache, N/V/D. No urinary symptoms. No recent travels or sick contacts. Admitted for sickle cell pain crisis.   ED Course: Patient was treated with IVF, IV pain medication with no improvement in pain symptoms. Patient was admitted for ongoing sickle cell pain management.  BP (!) 162/110  Pulse 92  Temp 98.3 F (36.8 C) (Oral)  Resp 17  Ht 1.702 m (5' 7)  Wt 90.7 kg  SpO2 100%  BMI 31.32 kg/m   Hospital Course:  Patient was admitted for sickle cell pain crisis and managed appropriately with IVF,  PO Oxycodone  and IV Toradol , as well as other adjunct therapies per sickle cell pain management protocols. Patient reports  improvement to pain symptoms this morning at [place], he is tolerating PO and ambulating without assistance.   Patient was therefore discharged home today in a hemodynamically stable condition.   [name] will follow-up with PCP within 1 week of this discharge. [name] was counseled extensively about nonpharmacologic means of pain management, patient verbalized understanding and was appreciative of  the care received during this admission.   We discussed the need for good hydration, monitoring of hydration status, avoidance of heat, cold, stress, and infection triggers. We discussed the need to be adherent with taking his home medications. Patient was reminded of the need to seek medical attention immediately if any symptom of bleeding, anemia, or infection occurs.  Discharge Exam: Vitals:   09/27/23 0226 09/27/23 0934  BP: 130/85 131/68  Pulse: (!) 52 65  Resp: 18 18  Temp: 98.1 F (36.7 C) 98.5 F (36.9 C)  SpO2: 100% 97%   Vitals:   09/26/23 1821 09/26/23 2005 09/27/23 0226 09/27/23 0934  BP: 118/75 124/77 130/85 131/68  Pulse: (!) 54 (!) 49 (!) 52 65  Resp:  18 18 18   Temp: 98.2 F (36.8 C) 97.8 F (36.6 C) 98.1 F (36.7 C) 98.5 F (36.9 C)  TempSrc: Oral Oral Oral   SpO2: 98% 100% 100% 97%  Weight:      Height:        General appearance : Awake, alert, not in any distress. Speech Clear. Not toxic looking HEENT: Atraumatic and Normocephalic, pupils equally reactive to light and accomodation Neck: Supple, no JVD. No cervical lymphadenopathy.  Chest: Good air entry  bilaterally, no added sounds  CVS: S1 S2 regular, no murmurs.  Abdomen: Bowel sounds present, Non tender and not distended with no gaurding, rigidity or rebound. Extremities: B/L Lower Ext shows no edema, both legs are warm to touch Neurology: Awake alert, and oriented X 3, CN II-XII intact, Non focal Skin: No Rash  Discharge Instructions  Discharge Instructions     Call MD for:  severe uncontrolled pain    Complete by: As directed    Call MD for:  temperature >100.4   Complete by: As directed    Diet - low sodium heart healthy   Complete by: As directed    Increase activity slowly   Complete by: As directed       Allergies as of 09/27/2023   No Known Allergies      Medication List     TAKE these medications    albuterol  108 (90 Base) MCG/ACT inhaler Commonly known as: VENTOLIN  HFA TAKE 2 PUFFS BY MOUTH EVERY 6 HOURS AS NEEDED FOR WHEEZE OR SHORTNESS OF BREATH   amLODipine  10 MG tablet Commonly known as: NORVASC  Take 1 tablet (10 mg total) by mouth daily.   cetirizine  10 MG tablet Commonly known as: ZYRTEC  TAKE 1 TABLET BY MOUTH EVERY DAY AS NEEDED FOR ALLERGY   cloNIDine  0.1 MG tablet Commonly known as: CATAPRES  TAKE 1 TABLET BY MOUTH 2 TIMES DAILY. What changed:  how much to take when to take this   DULoxetine  30 MG capsule Commonly known as: Cymbalta  Take 1 capsule (30 mg total) by mouth daily.   gabapentin  300 MG capsule Commonly known as: NEURONTIN  Take 1 capsule (300 mg total) by mouth 2 (two) times daily.   losartan -hydrochlorothiazide  100-25 MG tablet Commonly known as: HYZAAR Take 1 tablet by mouth daily.   metoprolol  succinate 50 MG 24 hr tablet Commonly known as: TOPROL -XL Take 1 tablet (50 mg total) by mouth daily. Take with or immediately following a meal.   montelukast  10 MG tablet Commonly known as: SINGULAIR  TAKE 1 TABLET BY MOUTH EVERYDAY AT BEDTIME   nicotine  21 mg/24hr patch Commonly known as: NICODERM CQ  - dosed in mg/24 hours Place 1 patch (21 mg total) onto the skin daily.   pantoprazole  40 MG tablet Commonly known as: PROTONIX  Take 1 tablet (40 mg total) by mouth daily.   tiZANidine  4 MG tablet Commonly known as: Zanaflex  Take 1 tablet (4 mg total) by mouth every 6 (six) hours as needed for muscle spasms.        The results of significant diagnostics from this hospitalization (including imaging, microbiology, ancillary and  laboratory) are listed below for reference.    Significant Diagnostic Studies: No results found.  Microbiology: No results found for this or any previous visit (from the past 240 hours).   Labs: Basic Metabolic Panel: No results for input(s): NA, K, CL, CO2, GLUCOSE, BUN, CREATININE, CALCIUM, MG, PHOS in the last 168 hours.  Liver Function Tests: No results for input(s): AST, ALT, ALKPHOS, BILITOT, PROT, ALBUMIN in the last 168 hours.  No results for input(s): LIPASE, AMYLASE in the last 168 hours. No results for input(s): AMMONIA in the last 168 hours. CBC: Recent Labs  Lab 09/26/23 0535 09/27/23 0539  WBC 7.9 9.5  HGB 12.5* 13.4  HCT 33.9* 36.9*  MCV 81.1 79.9*  PLT 262 315   Cardiac Enzymes: No results for input(s): CKTOTAL, CKMB, CKMBINDEX, TROPONINI in the last 168 hours.  BNP: Invalid input(s): POCBNP CBG: No results for input(s): GLUCAP  in the last 168 hours.  Time coordinating discharge: 50 minutes  Signed:  Homer CHRISTELLA Cover NP  09/30/2023, 7:45 AM

## 2023-09-28 ENCOUNTER — Telehealth: Payer: Self-pay

## 2023-09-28 NOTE — Transitions of Care (Post Inpatient/ED Visit) (Signed)
   09/28/2023  Name: Dean Webb MRN: [id number] DOB: May 06, 1975  Today's TOC FU Call Status: Today's TOC FU Call Status:: Unsuccessful Call (1st Attempt) Unsuccessful Call (1st Attempt) Date: 09/28/23  Attempted to reach the patient regarding the most recent Inpatient/ED visit.  Follow Up Plan: Additional outreach attempts will be made to reach the patient to complete the Transitions of Care (Post Inpatient/ED visit) call.   Arvin Seip RN, BSN, CCM [employer], Population Health Case Manager Phone: [phone]

## 2023-10-02 ENCOUNTER — Telehealth: Payer: Self-pay

## 2023-10-02 ENCOUNTER — Other Ambulatory Visit: Payer: Self-pay

## 2023-10-02 ENCOUNTER — Inpatient Hospital Stay (HOSPITAL_COMMUNITY)
Admission: EM | Admit: 2023-10-02 | Discharge: 2023-10-07 | DRG: 812 | Disposition: A | Discharge status: Home / Self Care | Payer: MEDICAID | Attending: Internal Medicine | Admitting: Internal Medicine

## 2023-10-02 ENCOUNTER — Emergency Department (HOSPITAL_COMMUNITY): Payer: MEDICAID

## 2023-10-02 ENCOUNTER — Encounter (HOSPITAL_COMMUNITY): Payer: Self-pay

## 2023-10-02 DIAGNOSIS — J069 Acute upper respiratory infection, unspecified: Secondary | ICD-10-CM | POA: Diagnosis present

## 2023-10-02 DIAGNOSIS — K5900 Constipation, unspecified: Secondary | ICD-10-CM | POA: Diagnosis present

## 2023-10-02 DIAGNOSIS — Z1152 Encounter for screening for COVID-19: Secondary | ICD-10-CM

## 2023-10-02 DIAGNOSIS — I10 Essential (primary) hypertension: Secondary | ICD-10-CM | POA: Diagnosis present

## 2023-10-02 DIAGNOSIS — D638 Anemia in other chronic diseases classified elsewhere: Secondary | ICD-10-CM | POA: Diagnosis present

## 2023-10-02 DIAGNOSIS — F1410 Cocaine abuse, uncomplicated: Secondary | ICD-10-CM | POA: Diagnosis present

## 2023-10-02 DIAGNOSIS — J4520 Mild intermittent asthma, uncomplicated: Secondary | ICD-10-CM | POA: Diagnosis present

## 2023-10-02 DIAGNOSIS — D5700 Hb-SS disease with crisis, unspecified: Principal | ICD-10-CM | POA: Diagnosis present

## 2023-10-02 DIAGNOSIS — Z8249 Family history of ischemic heart disease and other diseases of the circulatory system: Secondary | ICD-10-CM | POA: Diagnosis not present

## 2023-10-02 DIAGNOSIS — Z832 Family history of diseases of the blood and blood-forming organs and certain disorders involving the immune mechanism: Secondary | ICD-10-CM | POA: Diagnosis not present

## 2023-10-02 DIAGNOSIS — G894 Chronic pain syndrome: Secondary | ICD-10-CM | POA: Diagnosis present

## 2023-10-02 DIAGNOSIS — R051 Acute cough: Secondary | ICD-10-CM | POA: Diagnosis present

## 2023-10-02 DIAGNOSIS — F17210 Nicotine dependence, cigarettes, uncomplicated: Secondary | ICD-10-CM | POA: Diagnosis present

## 2023-10-02 LAB — TROPONIN I (HIGH SENSITIVITY)
Troponin I (High Sensitivity): 9 ng/L (ref <18)
Troponin I (High Sensitivity): 9 ng/L (ref <18)

## 2023-10-02 LAB — RESP PANEL BY RT-PCR (RSV, FLU A&B, COVID)  RVPGX2
Influenza A by PCR: NEGATIVE
Influenza B by PCR: NEGATIVE
Resp Syncytial Virus by PCR: NEGATIVE
SARS Coronavirus 2 by RT PCR: NEGATIVE

## 2023-10-02 LAB — RETICULOCYTES
Immature Retic Fract: 23.6 % — ABNORMAL HIGH (ref 2.3–15.9)
RBC.: 3.66 MIL/uL — ABNORMAL LOW (ref 4.22–5.81)
Retic Count, Absolute: 119 K/uL (ref 19.0–186.0)
Retic Ct Pct: 3.3 % — ABNORMAL HIGH (ref 0.4–3.1)

## 2023-10-02 LAB — COMPREHENSIVE METABOLIC PANEL WITH GFR
ALT: 22 U/L (ref 0–44)
AST: 22 U/L (ref 15–41)
Albumin: 4 g/dL (ref 3.5–5.0)
Alkaline Phosphatase: 89 U/L (ref 38–126)
Anion gap: 9 (ref 5–15)
BUN: 11 mg/dL (ref 6–20)
CO2: 23 mmol/L (ref 22–32)
Calcium: 10.5 mg/dL — ABNORMAL HIGH (ref 8.9–10.3)
Chloride: 103 mmol/L (ref 98–111)
Creatinine, Ser: 1.06 mg/dL (ref 0.61–1.24)
GFR, Estimated: >60 mL/min (ref >60)
Glucose, Bld: 110 mg/dL — ABNORMAL HIGH (ref 70–99)
Potassium: 3.7 mmol/L (ref 3.5–5.1)
Sodium: 135 mmol/L (ref 135–145)
Total Bilirubin: 1.4 mg/dL — ABNORMAL HIGH (ref 0.0–1.2)
Total Protein: 7.2 g/dL (ref 6.5–8.1)

## 2023-10-02 LAB — CBC WITH DIFFERENTIAL/PLATELET
Abs Granulocyte: 7 K/uL — ABNORMAL HIGH (ref 1.5–6.5)
Abs Immature Granulocytes: 0.08 K/uL — ABNORMAL HIGH (ref 0.00–0.07)
Basophils Absolute: 0.1 K/uL (ref 0.0–0.1)
Basophils Relative: 1 %
Eosinophils Absolute: 0.6 K/uL — ABNORMAL HIGH (ref 0.0–0.5)
Eosinophils Relative: 5 %
HCT: 28.7 % — ABNORMAL LOW (ref 39.0–52.0)
Hemoglobin: 10.7 g/dL — ABNORMAL LOW (ref 13.0–17.0)
Immature Granulocytes: 1 %
Lymphocytes Relative: 23 %
Lymphs Abs: 2.6 K/uL (ref 0.7–4.0)
MCH: 29.6 pg (ref 26.0–34.0)
MCHC: 37.3 g/dL — ABNORMAL HIGH (ref 30.0–36.0)
MCV: 79.5 fL — ABNORMAL LOW (ref 80.0–100.0)
Monocytes Absolute: 1.4 K/uL — ABNORMAL HIGH (ref 0.1–1.0)
Monocytes Relative: 12 %
Neutro Abs: 7 K/uL (ref 1.7–7.7)
Neutrophils Relative %: 58 %
Platelets: 345 K/uL (ref 150–400)
RBC: 3.61 MIL/uL — ABNORMAL LOW (ref 4.22–5.81)
RDW: 14.6 % (ref 11.5–15.5)
Smear Review: NORMAL
WBC: 11.8 K/uL — ABNORMAL HIGH (ref 4.0–10.5)
nRBC: 2.8 % — ABNORMAL HIGH (ref 0.0–0.2)

## 2023-10-02 MED ORDER — POLYETHYLENE GLYCOL 3350 17 G PO PACK
17.0000 g | PACK | Freq: Every day | ORAL | Status: DC | PRN
  Administered 2023-10-05: 17 g via ORAL
  Filled 2023-10-02: qty 1

## 2023-10-02 MED ORDER — SODIUM CHLORIDE 0.45 % IV SOLN: INTRAVENOUS | Status: AC

## 2023-10-02 MED ORDER — HYDROMORPHONE HCL 1 MG/ML IJ SOLN
2.0000 mg | INTRAMUSCULAR | Status: AC
  Administered 2023-10-02: 2 mg via INTRAVENOUS
  Filled 2023-10-02: qty 2

## 2023-10-02 MED ORDER — DEXTROSE-SODIUM CHLORIDE 5-0.45 % IV SOLN: INTRAVENOUS | Status: DC

## 2023-10-02 MED ORDER — HYDROMORPHONE HCL 1 MG/ML IJ SOLN
1.0000 mg | INTRAMUSCULAR | Status: DC | PRN
  Administered 2023-10-03 (×2): 1 mg via INTRAVENOUS
  Filled 2023-10-02 (×3): qty 1

## 2023-10-02 MED ORDER — OXYCODONE HCL 5 MG PO TABS
10.0000 mg | ORAL_TABLET | ORAL | Status: DC | PRN
  Administered 2023-10-03 – 2023-10-07 (×9): 10 mg via ORAL
  Filled 2023-10-02 (×9): qty 2

## 2023-10-02 MED ORDER — HYDROXYZINE HCL 25 MG PO TABS
25.0000 mg | ORAL_TABLET | ORAL | Status: DC | PRN
  Administered 2023-10-05: 25 mg via ORAL
  Filled 2023-10-02: qty 1
  Filled 2023-10-02: qty 2

## 2023-10-02 MED ORDER — KETOROLAC TROMETHAMINE 15 MG/ML IJ SOLN
15.0000 mg | Freq: Four times a day (QID) | INTRAMUSCULAR | Status: DC
  Administered 2023-10-03 – 2023-10-07 (×18): 15 mg via INTRAVENOUS
  Filled 2023-10-02 (×18): qty 1

## 2023-10-02 MED ORDER — ONDANSETRON HCL 4 MG/2ML IJ SOLN
4.0000 mg | INTRAMUSCULAR | Status: DC | PRN
  Administered 2023-10-02: 4 mg via INTRAVENOUS
  Filled 2023-10-02: qty 2

## 2023-10-02 MED ORDER — SENNOSIDES-DOCUSATE SODIUM 8.6-50 MG PO TABS
1.0000 | ORAL_TABLET | Freq: Two times a day (BID) | ORAL | Status: DC
  Administered 2023-10-03 – 2023-10-07 (×9): 1 via ORAL
  Filled 2023-10-02 (×10): qty 1

## 2023-10-02 MED ORDER — ONDANSETRON HCL 4 MG PO TABS: 4.0000 mg | ORAL_TABLET | ORAL | Status: DC | PRN

## 2023-10-02 MED ORDER — ENOXAPARIN SODIUM 40 MG/0.4ML IJ SOSY
40.0000 mg | PREFILLED_SYRINGE | INTRAMUSCULAR | Status: DC
  Administered 2023-10-03 – 2023-10-07 (×5): 40 mg via SUBCUTANEOUS
  Filled 2023-10-02 (×5): qty 0.4

## 2023-10-02 MED ORDER — ONDANSETRON HCL 4 MG/2ML IJ SOLN
4.0000 mg | INTRAMUSCULAR | Status: DC | PRN
Start: 2023-10-02 — End: 2023-10-07

## 2023-10-02 MED ORDER — SODIUM CHLORIDE 0.45 % IV SOLN: INTRAVENOUS | Status: DC

## 2023-10-02 MED ORDER — SODIUM CHLORIDE 0.9 % IV SOLN
12.5000 mg | Freq: Once | INTRAVENOUS | Status: AC
  Administered 2023-10-02: 12.5 mg via INTRAVENOUS
  Filled 2023-10-02: qty 12.5

## 2023-10-02 MED ORDER — KETOROLAC TROMETHAMINE 15 MG/ML IJ SOLN
15.0000 mg | INTRAMUSCULAR | Status: AC
  Administered 2023-10-02: 15 mg via INTRAVENOUS
  Filled 2023-10-02: qty 1

## 2023-10-02 MED ORDER — ACETAMINOPHEN 325 MG PO TABS: 650.0000 mg | ORAL_TABLET | Freq: Four times a day (QID) | ORAL | Status: DC | PRN

## 2023-10-02 NOTE — Transitions of Care (Post Inpatient/ED Visit) (Signed)
 10/02/2023  Name: Dean Webb MRN: [id number] DOB: 09/09/1975  Today's TOC FU Call Status: Today's TOC FU Call Status:: Successful TOC FU Call Completed TOC FU Call Complete Date: 10/02/23 Patient's Name and Date of Birth confirmed.  Transition Care Management Follow-up Telephone Call Date of Discharge: 09/27/23 Discharge Facility: [facility]) Type of Discharge: Inpatient Admission Primary Inpatient Discharge Diagnosis:: Sickle cell crisis How have you been since you were released from the hospital?: Same (patient states he has had a cough, runny nose, achiness, and felt like he's had a fever over the past 3 days.  Patient is scheduled to see his primary care provider on tomorrow 10/03/23. Advised to report these symptoms to provider.) Any questions or concerns?: No  Items Reviewed: Did you receive and understand the discharge instructions provided?: Yes Medications obtained,verified, and reconciled?: Yes (Medications Reviewed) Any new allergies since your discharge?: No Dietary orders reviewed?: Yes Type of Diet Ordered:: low salt heart healthy Do you have support at home?: Yes People in Home [RPT]: sibling(s) Name of Support/Comfort Primary Source: patricia simmons  Medications Reviewed Today: Medications Reviewed Today     Reviewed by Gearl Kimbrough E, RN (Registered Nurse) on 10/02/23 at 1703  Med List Status: <None>   Medication Order Taking? Sig Documenting Provider Last Dose Status Informant  albuterol  (VENTOLIN  HFA) 108 (90 Base) MCG/ACT inhaler [id number] Yes TAKE 2 PUFFS BY MOUTH EVERY 6 HOURS AS NEEDED FOR WHEEZE OR SHORTNESS OF BREATH Nichols, Tonya S, NP  Active Self, Pharmacy Records  amLODipine  (NORVASC ) 10 MG tablet [id number] Yes Take 1 tablet (10 mg total) by mouth daily. Oley Bascom RAMAN, NP  Active Self, Pharmacy Records  cetirizine  (ZYRTEC ) 10 MG tablet [id number] Yes TAKE 1 TABLET BY MOUTH EVERY DAY AS NEEDED FOR ALLERGY Nichols, Tonya S, NP  Active  Self, Pharmacy Records  cloNIDine  (CATAPRES ) 0.1 MG tablet [id number] Yes TAKE 1 TABLET BY MOUTH 2 TIMES DAILY. Oley Bascom RAMAN, NP  Active Self, Pharmacy Records           Med Note ANGUS TINNIE Repress Apr 01, 2023  9:32 PM) LF 09/12/23 30d/s  DULoxetine  (CYMBALTA ) 30 MG capsule [id number]  Take 1 capsule (30 mg total) by mouth daily.  Patient not taking: Reported on 10/02/2023   Sonjia Held, MD  Active Self, Pharmacy Records           Med Note [facility] [place], [place] A   Thu Sep 20, 2023  8:37 PM) Pt is out of medication  gabapentin  (NEURONTIN ) 300 MG capsule [id number]  Take 1 capsule (300 mg total) by mouth 2 (two) times daily.  Patient not taking: Reported on 09/20/2023   Oley Bascom RAMAN, NP  Active Self, Pharmacy Records           Med Note [facility] [place], [place] A   Thu Sep 20, 2023  8:37 PM) Pt is out of medication  losartan -hydrochlorothiazide  (HYZAAR) 100-25 MG tablet [id number]  Take 1 tablet by mouth daily.  Patient not taking: Reported on 10/02/2023   Sonjia Held, MD  Active Self, Pharmacy Records           Med Note [facility] [place], [place] A   Thu Sep 20, 2023  8:37 PM) Pt is out of medication  metoprolol  succinate (TOPROL -XL) 50 MG 24 hr tablet [id number]  Take 1 tablet (50 mg total) by mouth daily. Take with or immediately following a meal.  Patient not taking: Reported on 10/02/2023   Oley Bascom RAMAN, NP  Active Self, Pharmacy Records           Med Note [facility] [place], [place] A   Thu Sep 20, 2023  8:37 PM) Pt is out of medication  montelukast  (SINGULAIR ) 10 MG tablet [id number] Yes TAKE 1 TABLET BY MOUTH EVERYDAY AT BEDTIME Nichols, Tonya S, NP  Active Self, Pharmacy Records  nicotine  (NICODERM CQ  - DOSED IN MG/24 HOURS) 21 mg/24hr patch [id number] Yes Place 1 patch (21 mg total) onto the skin daily. Marry Clamp, MD  Active Self, Pharmacy Records  pantoprazole  (PROTONIX ) 40 MG tablet [id number] Yes Take 1 tablet (40 mg total) by mouth daily. Oley Bascom RAMAN, NP   Active Self, Pharmacy Records           Med Note [facility] [place], [place] A   Thu Sep 20, 2023  8:37 PM) Pt is out of medication  tiZANidine  (ZANAFLEX ) 4 MG tablet [id number] Yes Take 1 tablet (4 mg total) by mouth every 6 (six) hours as needed for muscle spasms. Oley Bascom RAMAN, NP  Active Self, Pharmacy Records           Med Note [facility] [place], [place] A   Thu Sep 20, 2023  8:37 PM) Pt is out of medication            Home Care and Equipment/Supplies: Were Home Health Services Ordered?: No Any new equipment or medical supplies ordered?: No  Functional Questionnaire: Do you need assistance with bathing/showering or dressing?: No Do you need assistance with meal preparation?: No Do you need assistance with eating?: No Do you have difficulty maintaining continence: No Do you need assistance with getting out of bed/getting out of a chair/moving?: No Do you have difficulty managing or taking your medications?: No  Follow up appointments reviewed: PCP Follow-up appointment confirmed?: Yes Date of PCP follow-up appointment?: 10/03/23 Follow-up Provider: Bascom Oley, FNP Specialist Hospital Follow-up appointment confirmed?: NA Do you need transportation to your follow-up appointment?: No Do you understand care options if your condition(s) worsen?: Yes-patient verbalized understanding  SDOH Interventions Today    Flowsheet Row Most Recent Value  SDOH Interventions   Food Insecurity Interventions Intervention Not Indicated  Housing Interventions Intervention Not Indicated  Transportation Interventions Intervention Not Indicated  Utilities Interventions Other (Comment)  [referral to Recruitment consultant for resource information assistance at patients request.]    Alizza Sacra RN, Scientist, research (physical sciences), CCM [employer], Population Health Case Manager Phone: [phone]

## 2023-10-02 NOTE — ED Notes (Signed)
 Patient placed on 2L [place] d/t oxygen level dropping when patient asleep.

## 2023-10-02 NOTE — ED Triage Notes (Signed)
 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 Pt says pain usually presents similar but today has a subjective fever and productive cough with green sputum.

## 2023-10-02 NOTE — H&P (Signed)
 History and Physical    Dean Webb DOB: 1975-04-20 DOA: 10/02/2023  PCP: Oley Bascom RAMAN, NP  Patient coming from: Home  I have personally briefly reviewed patient's old medical records in [facility]  Chief Complaint: Sickle cell pain crisis  HPI: Dean Webb is a 48 y.o. male with medical history significant for sickle cell disease, chronic pain syndrome, anemia of chronic disease, mild intermittent asthma, HTN, GERD, cocaine  abuse, tobacco use who presented to the ED for evaluation of chest and back pain similar to sickle cell pain crisis episodes.  Patient reports developing URI symptoms about 2 days ago with cough productive of green sputum as well as green rhinorrhea.  He has been feeling fatigued.  He is concerned that this has flared up his sickle cell pain.  He has been experiencing primarily chest and back pain as well as some pain in all of his extremities similar to his prior sickle cell flareups.  He has not had chest pain or dyspnea.  Patient was recently admitted for sickle cell pain crisis 8/7-8/14.  It was noted that UDS was positive for cocaine  therefore PCA pump was avoided and he was managed on IV Dilaudid  1 mg every 3 hours as needed in addition to IV fluids and Toradol ..  ED Course  Labs/Imaging on admission: I have personally reviewed following labs and imaging studies.  Initial vitals showed BP 138/91, pulse 92, RR 20, temp 98.9 F, SpO2 99% on room air.  Labs showed WBC 11.8, hemoglobin 10.7, platelets 345, sodium 135, potassium 3.7, bicarb 23, BUN 11, creatinine 1.06, serum glucose 110, troponin 9 x 2.  SARS-CoV-2, influenza, RSV PCR negative.  2 view chest x-ray negative for focal consolidation, edema, effusion.  Patient was given IV Dilaudid  2 mg x 3, IV Toradol  15 mg.  The hospitalist service was consulted for admission.  Review of Systems: All systems reviewed and are negative except as documented in history of present  illness above.   Past Medical History:  Diagnosis Date   Asthma    GERD (gastroesophageal reflux disease)    Hb-S/Hb-C disease (HCC) 1979   HTN (hypertension) 2008   Hypertension    Sickle cell anemia (HCC)    Sickle cell disease (HCC)     Past Surgical History:  Procedure Laterality Date   CHOLECYSTECTOMY      Social History: Patient reports smoking 5-6 cigarettes daily.  He denies any alcohol use.  He denies any recent illicit drug use.  No Known Allergies  Family History  Problem Relation Age of Onset   Sickle cell trait Mother    Hypertension Mother    Asthma Mother    Sickle cell trait Father    Congestive Heart Failure Father    Hypertension Sister    Sickle cell anemia Daughter    Hypertension Daughter    Diabetes Daughter      Prior to Admission medications   Medication Sig Start Date End Date Taking? Authorizing Provider  albuterol  (VENTOLIN  HFA) 108 (90 Base) MCG/ACT inhaler TAKE 2 PUFFS BY MOUTH EVERY 6 HOURS AS NEEDED FOR WHEEZE OR SHORTNESS OF BREATH 09/05/22   Oley Bascom RAMAN, NP  amLODipine  (NORVASC ) 10 MG tablet Take 1 tablet (10 mg total) by mouth daily. 04/17/22   Oley Bascom RAMAN, NP  cetirizine  (ZYRTEC ) 10 MG tablet TAKE 1 TABLET BY MOUTH EVERY DAY AS NEEDED FOR ALLERGY 04/17/22   Oley Bascom RAMAN, NP  cloNIDine  (CATAPRES ) 0.1 MG tablet TAKE 1  TABLET BY MOUTH 2 TIMES DAILY. 07/17/22   Oley Bascom RAMAN, NP  DULoxetine  (CYMBALTA ) 30 MG capsule Take 1 capsule (30 mg total) by mouth daily. Patient not taking: Reported on 10/02/2023 08/25/22   Pokhrel, Vernal, MD  gabapentin  (NEURONTIN ) 300 MG capsule Take 1 capsule (300 mg total) by mouth 2 (two) times daily. Patient not taking: Reported on 10/02/2023 07/17/22   Oley Bascom RAMAN, NP  losartan -hydrochlorothiazide  (HYZAAR) 100-25 MG tablet Take 1 tablet by mouth daily. Patient not taking: Reported on 10/02/2023 08/27/22   Pokhrel, Laxman, MD  metoprolol  succinate (TOPROL -XL) 50 MG 24 hr tablet Take 1 tablet (50 mg  total) by mouth daily. Take with or immediately following a meal. Patient not taking: Reported on 10/02/2023 04/17/22   Oley Bascom RAMAN, NP  montelukast  (SINGULAIR ) 10 MG tablet TAKE 1 TABLET BY MOUTH EVERYDAY AT BEDTIME 04/17/22   Nichols, Tonya S, NP  nicotine  (NICODERM CQ  - DOSED IN MG/24 HOURS) 21 mg/24hr patch Place 1 patch (21 mg total) onto the skin daily. 05/06/23   Marry Clamp, MD  pantoprazole  (PROTONIX ) 40 MG tablet Take 1 tablet (40 mg total) by mouth daily. 04/17/22   Nichols, Tonya S, NP  tiZANidine  (ZANAFLEX ) 4 MG tablet Take 1 tablet (4 mg total) by mouth every 6 (six) hours as needed for muscle spasms. 10/13/22   Oley Bascom RAMAN, NP    Physical Exam: Vitals:   10/02/23 2200 10/02/23 2230 10/02/23 2300 10/02/23 2306  BP: (!) 159/107 (!) 150/112 (!) 158/103   Pulse: 79 (!) 58 60   Resp: 15 14 13    Temp:    98.1 F (36.7 C)  TempSrc:    Oral  SpO2: 96% 94% 98%   Weight:      Height:       Constitutional: Resting in bed, NAD, calm, comfortable Eyes: EOMI, lids and conjunctivae normal ENMT: Mucous membranes are moist. Posterior pharynx clear of any exudate or lesions.Normal dentition.  Neck: normal, supple, no masses. Respiratory: clear to auscultation bilaterally, no wheezing, no crackles. Normal respiratory effort. No accessory muscle use.  Cardiovascular: Regular rate and rhythm, no murmurs / rubs / gallops. No extremity edema. 2+ pedal pulses. Abdomen: no tenderness, no masses palpated. Musculoskeletal: no clubbing / cyanosis. No joint deformity upper and lower extremities. Good ROM, no contractures. Normal muscle tone.  Skin: no rashes, lesions, ulcers. No induration Neurologic: Sensation intact. Strength 5/5 in all 4.  Psychiatric: Normal judgment and insight. Alert and oriented x 3. Normal mood.   EKG: Personally reviewed. Sinus rhythm, rate 92, no acute ischemic changes, motion artifact.  Assessment/Plan Principal Problem:   Sickle cell pain crisis (HCC) Active  Problems:   Essential hypertension   Cocaine  abuse (HCC)   Mild intermittent asthma without complication   Dean Webb is a 48 y.o. male with medical history significant for sickle cell disease, chronic pain syndrome, anemia of chronic disease, mild intermittent asthma, HTN, GERD, cocaine  abuse, tobacco use who is admitted with sickle cell pain crisis.  Assessment and Plan: Sickle cell pain crisis: Likely triggered by viral URI.  Recent UDS positive for cocaine  therefore we will avoid PCA pump at this time. - IV Dilaudid  1 mg every 3 hours as needed severe pain - Oxy IR 10 mg every 4 hours as needed moderate pain - Continue IV fluid hydration with 0.45% NS - IV Toradol  15 mg every 6 hours - Obtain UDS  Anemia of chronic disease: Hemoglobin 10.7.  No indication for transfusion.  Continue to monitor.  Chronic pain syndrome: Does not appear the patient is currently on an opioid pain regimen as an outpatient.  Per PDMP last prescription for oxycodone  was filled 04/01/2023.  Has been off Cymbalta  since running out of his meds, will resume.  Mild intermittent asthma: Stable, no wheezing on admission.  Continue Singulair  and albuterol  as needed.  Hypertension: Continue amlodipine  and clonidine .  History of cocaine  abuse: Obtain UDS.  Tobacco use: Patient reports smoking 5-6 cigarettes daily.  Nicotine  patch provided.   DVT prophylaxis: enoxaparin  (LOVENOX ) injection 40 mg Start: 10/03/23 1000 Code Status: Full code Family Communication: Cussed with patient, he has discussed with family Disposition Plan: From home, dispo pending clinical progress Consults called: None Severity of Illness: The appropriate patient status for this patient is INPATIENT. Inpatient status is judged to be reasonable and necessary in order to provide the required intensity of service to ensure the patient's safety. The patient's presenting symptoms, physical exam findings, and initial radiographic and  laboratory data in the context of their chronic comorbidities is felt to place them at high risk for further clinical deterioration. Furthermore, it is not anticipated that the patient will be medically stable for discharge from the hospital within 2 midnights of admission.   * I certify that at the point of admission it is my clinical judgment that the patient will require inpatient hospital care spanning beyond 2 midnights from the point of admission due to high intensity of service, high risk for further deterioration and high frequency of surveillance required.Dean Jorie Blanch MD [facility]  If 7PM-7AM, please contact night-coverage [email or web address]  10/03/2023, 12:06 AM

## 2023-10-02 NOTE — ED Provider Notes (Signed)
 [place] [facility]MENT AT [facility] Provider Note   CSN: [id number] Arrival date & time: 10/02/23  8151     Patient presents with: Sickle Cell Pain Crisis   Dean Webb is a 48 y.o. male.   48 year old male with history of sickle disease who presents with pain to his chest and arms consistent with his prior pain crisis exacerbations.  I personally saw this patient recently and admitted him for similar symptoms.  That inpatient hospitalization was reviewed.  Patient was discharged after about a week.  States that he feels the last couple days, he has contracted a virus.  He has had nonproductive cough.  Subjective fevers.  No vomiting or diarrhea.  Has not been short of breath.  Thinks maybe he has had some burning when he pees but denies any flank pain.  Has been using his home medication without relief       Prior to Admission medications   Medication Sig Start Date End Date Taking? Authorizing Provider  albuterol  (VENTOLIN  HFA) 108 (90 Base) MCG/ACT inhaler TAKE 2 PUFFS BY MOUTH EVERY 6 HOURS AS NEEDED FOR WHEEZE OR SHORTNESS OF BREATH 09/05/22   Oley Bascom RAMAN, NP  amLODipine  (NORVASC ) 10 MG tablet Take 1 tablet (10 mg total) by mouth daily. 04/17/22   Oley Bascom RAMAN, NP  cetirizine  (ZYRTEC ) 10 MG tablet TAKE 1 TABLET BY MOUTH EVERY DAY AS NEEDED FOR ALLERGY 04/17/22   Nichols, Tonya S, NP  cloNIDine  (CATAPRES ) 0.1 MG tablet TAKE 1 TABLET BY MOUTH 2 TIMES DAILY. 07/17/22   Oley Bascom RAMAN, NP  DULoxetine  (CYMBALTA ) 30 MG capsule Take 1 capsule (30 mg total) by mouth daily. Patient not taking: Reported on 10/02/2023 08/25/22   Pokhrel, Laxman, MD  gabapentin  (NEURONTIN ) 300 MG capsule Take 1 capsule (300 mg total) by mouth 2 (two) times daily. Patient not taking: Reported on 09/20/2023 07/17/22   Oley Bascom RAMAN, NP  losartan -hydrochlorothiazide  (HYZAAR) 100-25 MG tablet Take 1 tablet by mouth daily. Patient not taking: Reported on 10/02/2023 08/27/22   Pokhrel,  Laxman, MD  metoprolol  succinate (TOPROL -XL) 50 MG 24 hr tablet Take 1 tablet (50 mg total) by mouth daily. Take with or immediately following a meal. Patient not taking: Reported on 10/02/2023 04/17/22   Oley Bascom RAMAN, NP  montelukast  (SINGULAIR ) 10 MG tablet TAKE 1 TABLET BY MOUTH EVERYDAY AT BEDTIME 04/17/22   Nichols, Tonya S, NP  nicotine  (NICODERM CQ  - DOSED IN MG/24 HOURS) 21 mg/24hr patch Place 1 patch (21 mg total) onto the skin daily. 05/06/23   Marry Clamp, MD  pantoprazole  (PROTONIX ) 40 MG tablet Take 1 tablet (40 mg total) by mouth daily. 04/17/22   Oley Bascom RAMAN, NP  tiZANidine  (ZANAFLEX ) 4 MG tablet Take 1 tablet (4 mg total) by mouth every 6 (six) hours as needed for muscle spasms. 10/13/22   Oley Bascom RAMAN, NP    Allergies: Patient has no known allergies.    Review of Systems  All other systems reviewed and are negative.   Updated Vital Signs BP (!) 161/98 (BP Location: Left Arm)   Pulse (!) 102   Temp 98.9 F (37.2 C) (Oral)   Resp 20   Ht 1.702 m (5' 7)   Wt 90.7 kg   SpO2 100%   BMI 31.32 kg/m   Physical Exam Vitals and nursing note reviewed.  Constitutional:      General: He is not in acute distress.    Appearance: Normal appearance. He is  well-developed. He is not toxic-appearing.  HENT:     Head: Normocephalic and atraumatic.  Eyes:     General: Lids are normal.     Conjunctiva/sclera: Conjunctivae normal.     Pupils: Pupils are equal, round, and reactive to light.  Neck:     Thyroid : No thyroid  mass.     Trachea: No tracheal deviation.  Cardiovascular:     Rate and Rhythm: Normal rate and regular rhythm.     Heart sounds: Normal heart sounds. No murmur heard.    No gallop.  Pulmonary:     Effort: Pulmonary effort is normal. No respiratory distress.     Breath sounds: Normal breath sounds. No stridor. No decreased breath sounds, wheezing, rhonchi or rales.  Abdominal:     General: There is no distension.     Palpations: Abdomen is soft.      Tenderness: There is no abdominal tenderness. There is no rebound.  Musculoskeletal:        General: No tenderness. Normal range of motion.     Cervical back: Normal range of motion and neck supple.  Skin:    General: Skin is warm and dry.     Findings: No abrasion or rash.  Neurological:     Mental Status: He is alert and oriented to person, place, and time. Mental status is at baseline.     GCS: GCS eye subscore is 4. GCS verbal subscore is 5. GCS motor subscore is 6.     Cranial Nerves: No cranial nerve deficit.     Sensory: No sensory deficit.     Motor: Motor function is intact.  Psychiatric:        Attention and Perception: Attention normal.        Speech: Speech normal.        Behavior: Behavior normal.     (all labs ordered are listed, but only abnormal results are displayed) Labs Reviewed  COMPREHENSIVE METABOLIC PANEL WITH GFR - Abnormal; Notable for the following components:      Result Value   Glucose, Bld 110 (*)    Calcium 10.5 (*)    Total Bilirubin 1.4 (*)    All other components within normal limits  CBC WITH DIFFERENTIAL/PLATELET - Abnormal; Notable for the following components:   WBC 11.8 (*)    RBC 3.61 (*)    Hemoglobin 10.7 (*)    HCT 28.7 (*)    MCV 79.5 (*)    MCHC 37.3 (*)    nRBC 2.8 (*)    Monocytes Absolute 1.4 (*)    Eosinophils Absolute 0.6 (*)    Abs Immature Granulocytes 0.08 (*)    Abs Granulocyte 7.0 (*)    All other components within normal limits  RETICULOCYTES - Abnormal; Notable for the following components:   Retic Ct Pct 3.3 (*)    RBC. 3.66 (*)    Immature Retic Fract 23.6 (*)    All other components within normal limits  RESP PANEL BY RT-PCR (RSV, FLU A&B, COVID)  RVPGX2  URINALYSIS, W/ REFLEX TO CULTURE (INFECTION SUSPECTED)  TROPONIN I (HIGH SENSITIVITY)  TROPONIN I (HIGH SENSITIVITY)    EKG: EKG Interpretation Date/Time:  Tuesday October 02 2023 19:17:13 EDT Ventricular Rate:  92 PR Interval:  132 QRS  Duration:  80 QT Interval:  349 QTC Calculation: 432 R Axis:   27  Text Interpretation: Sinus rhythm Borderline T abnormalities, inferior leads Baseline wander in lead(s) V5 Confirmed by Dasie Faden ([id number]) on 10/02/2023 9:29:50 PM  Radiology: DG Chest 2 View Result Date: 10/02/2023 CLINICAL DATA:  Chest pain EXAM: CHEST - 2 VIEW COMPARISON:  04/01/2023 FINDINGS: Heart and mediastinal contours are within normal limits. No focal opacities or effusions. No acute bony abnormality. IMPRESSION: No active cardiopulmonary disease. Electronically Signed   By: Franky Crease M.D.   On: 10/02/2023 19:31     Procedures   Medications Ordered in the ED  dextrose  5 % and 0.45 % NaCl infusion (has no administration in time range)  ketorolac  (TORADOL ) 15 MG/ML injection 15 mg (has no administration in time range)  HYDROmorphone  (DILAUDID ) injection 2 mg (has no administration in time range)  HYDROmorphone  (DILAUDID ) injection 2 mg (has no administration in time range)  HYDROmorphone  (DILAUDID ) injection 2 mg (has no administration in time range)  diphenhydrAMINE  (BENADRYL ) 12.5 mg in sodium chloride  0.9 % 50 mL IVPB (has no administration in time range)  ondansetron  (ZOFRAN ) injection 4 mg (has no administration in time range)                                    Medical Decision Making Amount and/or Complexity of Data Reviewed Labs: ordered. Radiology: ordered. ECG/medicine tests: ordered.  Risk Prescription drug management.   Patient is EKG shows normal sinus rhythm.  Chest x-ray without acute findings.  No ischemic changes noted.  Patient treated with hydromorphone  as well as Toradol .  Patient been complaining of upper respiratory symptoms.  COVID and flu test negative.  Cardiac enzymes negative.  Which were done due to patient having chest pain.  Hemoglobin stable at 10.7.  Despite IV fluids as well as multiple rounds of IV hydromorphone  patient complains of having pain.  Will consult  hospitalist for admission  CRITICAL CARE Performed by: Curtistine ONEIDA Dawn Total critical care time: 55 minutes Critical care time was exclusive of separately billable procedures and treating other patients. Critical care was necessary to treat or prevent imminent or life-threatening deterioration. Critical care was time spent personally by me on the following activities: development of treatment plan with patient and/or surrogate as well as nursing, discussions with consultants, evaluation of patient's response to treatment, examination of patient, obtaining history from patient or surrogate, ordering and performing treatments and interventions, ordering and review of laboratory studies, ordering and review of radiographic studies, pulse oximetry and re-evaluation of patient's condition.      Final diagnoses:  None    ED Discharge Orders     None          Dawn Curtistine, MD 10/02/23 2315

## 2023-10-02 NOTE — ED Provider Triage Note (Signed)
 Emergency Medicine Provider Triage Evaluation Note  Dean Webb , a 48 y.o. male  was evaluated in triage.  Pt complains of whole body pain which is similar to SCC. Also complains of productive cough, congestion, cp which is new. CP described as pressure worsens with coughing No NVD  Review of Systems  Positive: See hpi Negative:   Physical Exam  BP (!) 161/98 (BP Location: Left Arm)   Pulse (!) 102   Temp 98.9 F (37.2 C) (Oral)   Resp 20   Ht 5' 7 (1.702 m)   Wt 90.7 kg   SpO2 100%   BMI 31.32 kg/m  Gen:   Awake, no distress   Resp:  Normal effort  MSK:   Moves extremities without difficulty  Other:    Medical Decision Making  Medically screening exam initiated at 7:49 PM.  Appropriate orders placed.  Dean Webb was informed that the remainder of the evaluation will be completed by another provider, this initial triage assessment does not replace that evaluation, and the importance of remaining in the ED until their evaluation is complete.  Labs ordered. CXR wo abnormalities   Dean Webb, Dean Webb 10/02/23 1951

## 2023-10-02 NOTE — Patient Instructions (Signed)
 Visit Information  Thank you for taking time to visit with me today. Please don't hesitate to contact me if I can be of assistance to you:  Patient instructions: Stay hydrated - drink plenty of water Avoid extreme temperatures - heat/ cold ( avoid sudden changes in temperature such as jumping into cold water. Eat a balanced diet rich in fruits, vegetables, whole grains, and lean proteins Take medications as prescribed Follow up with your providers regularly and as recommended Consider doing regular gentle exercise such as walking Seek emergency medical services for severe symptoms Notify your provider of symptoms you reported: cough, nasal congestion, feeling achy and feverish.  Call MD for severe uncontrolled pain Call MD for temperature >100 Increase activity slowly Call patient care center [phone] for new or ongoing symptoms/ concerns   Patient verbalizes understanding of instructions and care plan provided today and agrees to view in MyChart. Active MyChart status and patient understanding of how to access instructions and care plan via MyChart confirmed with patient.     The patient has been provided with contact information for the care management team and has been advised to call with any health related questions or concerns.   Please call the care guide team at [phone] if you need to cancel or reschedule your appointment.   Please call the Suicide and Crisis Lifeline: 988 call 1-800-273-TALK (toll free, 24 hour hotline) if you are experiencing a Mental Health or Behavioral Health Crisis or need someone to talk to.  Arvin Seip RN, BSN, CCM [employer], Population Health Case Manager Phone: [phone]

## 2023-10-03 ENCOUNTER — Inpatient Hospital Stay: Payer: Self-pay | Admitting: Nurse Practitioner

## 2023-10-03 LAB — URINALYSIS, W/ REFLEX TO CULTURE (INFECTION SUSPECTED)
Bilirubin Urine: NEGATIVE
Glucose, UA: NEGATIVE mg/dL
Hgb urine dipstick: NEGATIVE
Ketones, ur: NEGATIVE mg/dL
Leukocytes,Ua: NEGATIVE
Nitrite: NEGATIVE
Protein, ur: NEGATIVE mg/dL
Specific Gravity, Urine: 1.012 (ref 1.005–1.030)
pH: 6 (ref 5.0–8.0)

## 2023-10-03 LAB — RAPID URINE DRUG SCREEN, HOSP PERFORMED
Amphetamines: NOT DETECTED
Barbiturates: NOT DETECTED
Benzodiazepines: NOT DETECTED
Cocaine: POSITIVE — AB
Opiates: POSITIVE — AB
Tetrahydrocannabinol: NOT DETECTED

## 2023-10-03 LAB — CBC
HCT: 28.7 % — ABNORMAL LOW (ref 39.0–52.0)
Hemoglobin: 10.5 g/dL — ABNORMAL LOW (ref 13.0–17.0)
MCH: 29.3 pg (ref 26.0–34.0)
MCHC: 36.6 g/dL — ABNORMAL HIGH (ref 30.0–36.0)
MCV: 80.2 fL (ref 80.0–100.0)
Platelets: 321 K/uL (ref 150–400)
RBC: 3.58 MIL/uL — ABNORMAL LOW (ref 4.22–5.81)
RDW: 14.8 % (ref 11.5–15.5)
WBC: 14.1 K/uL — ABNORMAL HIGH (ref 4.0–10.5)
nRBC: 1.9 % — ABNORMAL HIGH (ref 0.0–0.2)

## 2023-10-03 LAB — BASIC METABOLIC PANEL WITH GFR
Anion gap: 6 (ref 5–15)
BUN: 12 mg/dL (ref 6–20)
CO2: 25 mmol/L (ref 22–32)
Calcium: 10.3 mg/dL (ref 8.9–10.3)
Chloride: 103 mmol/L (ref 98–111)
Creatinine, Ser: 0.81 mg/dL (ref 0.61–1.24)
GFR, Estimated: >60 mL/min (ref >60)
Glucose, Bld: 125 mg/dL — ABNORMAL HIGH (ref 70–99)
Potassium: 4 mmol/L (ref 3.5–5.1)
Sodium: 134 mmol/L — ABNORMAL LOW (ref 135–145)

## 2023-10-03 MED ORDER — GUAIFENESIN ER 600 MG PO TB12
600.0000 mg | ORAL_TABLET | Freq: Two times a day (BID) | ORAL | Status: DC | PRN
  Administered 2023-10-05: 600 mg via ORAL
  Filled 2023-10-03: qty 1

## 2023-10-03 MED ORDER — CLONIDINE HCL 0.1 MG PO TABS
0.1000 mg | ORAL_TABLET | Freq: Two times a day (BID) | ORAL | Status: DC
  Administered 2023-10-03 – 2023-10-07 (×9): 0.1 mg via ORAL
  Filled 2023-10-03 (×9): qty 1

## 2023-10-03 MED ORDER — DULOXETINE HCL 30 MG PO CPEP
30.0000 mg | ORAL_CAPSULE | Freq: Every day | ORAL | Status: DC
  Administered 2023-10-03 – 2023-10-07 (×5): 30 mg via ORAL
  Filled 2023-10-03 (×5): qty 1

## 2023-10-03 MED ORDER — AMLODIPINE BESYLATE 10 MG PO TABS
10.0000 mg | ORAL_TABLET | Freq: Every day | ORAL | Status: DC
  Administered 2023-10-03 – 2023-10-07 (×5): 10 mg via ORAL
  Filled 2023-10-03 (×5): qty 1

## 2023-10-03 MED ORDER — CARMEX CLASSIC LIP BALM EX OINT
1.0000 | TOPICAL_OINTMENT | CUTANEOUS | Status: DC | PRN
  Filled 2023-10-03: qty 10

## 2023-10-03 MED ORDER — MONTELUKAST SODIUM 10 MG PO TABS
10.0000 mg | ORAL_TABLET | Freq: Every day | ORAL | Status: DC
  Administered 2023-10-03 – 2023-10-06 (×5): 10 mg via ORAL
  Filled 2023-10-03 (×6): qty 1

## 2023-10-03 MED ORDER — PANTOPRAZOLE SODIUM 40 MG PO TBEC
40.0000 mg | DELAYED_RELEASE_TABLET | Freq: Every day | ORAL | Status: DC
  Administered 2023-10-03 – 2023-10-07 (×5): 40 mg via ORAL
  Filled 2023-10-03 (×5): qty 1

## 2023-10-03 MED ORDER — LORATADINE 10 MG PO TABS
10.0000 mg | ORAL_TABLET | Freq: Every day | ORAL | Status: DC
  Administered 2023-10-03 – 2023-10-07 (×5): 10 mg via ORAL
  Filled 2023-10-03 (×5): qty 1

## 2023-10-03 MED ORDER — NICOTINE 14 MG/24HR TD PT24
14.0000 mg | MEDICATED_PATCH | Freq: Every day | TRANSDERMAL | Status: DC
  Administered 2023-10-03 – 2023-10-07 (×6): 14 mg via TRANSDERMAL
  Filled 2023-10-03 (×6): qty 1

## 2023-10-03 MED ORDER — ALBUTEROL SULFATE (2.5 MG/3ML) 0.083% IN NEBU: 3.0000 mL | INHALATION_SOLUTION | Freq: Four times a day (QID) | RESPIRATORY_TRACT | Status: DC | PRN

## 2023-10-03 NOTE — H&P (Incomplete)
 History and Physical    Dean Webb FMW:[id number] DOB: [birth year] DOA: 10/02/2023  PCP: Oley Bascom RAMAN, NP  Patient coming from: Home  I have personally briefly reviewed patient's old medical records in [facility]  Chief Complaint: Sickle cell pain crisis  HPI: Dean Webb is a 48 y.o. male with medical history significant for sickle cell disease, chronic pain syndrome, anemia of chronic disease, mild intermittent asthma, HTN, GERD, cocaine  abuse, tobacco use who presented to the ED for evaluation of chest and back pain similar to sickle cell pain crisis episodes.  Patient reports developing URI symptoms about 2 days ago with cough productive of green sputum as well as green rhinorrhea.  He has been feeling fatigued.  He is concerned that this has flared up his sickle cell pain.  He has been experiencing primarily chest and back pain as well as some pain in all of his extremities similar to his prior sickle cell flareups.  He has not had chest pain or dyspnea.  ED Course  Labs/Imaging on admission: I have personally reviewed following labs and imaging studies.  Initial vitals showed BP 138/91, pulse 92, RR 20, temp 98.9 F, SpO2 99% on room air.  Labs showed WBC 11.8, hemoglobin 10.7, platelets 345, sodium 135, potassium 3.7, bicarb 23, BUN 11, creatinine 1.06, serum glucose 110, troponin 9 x 2.  SARS-CoV-2, influenza, RSV PCR negative.  2 view chest x-ray negative for focal consolidation, edema, effusion.  Patient was given IV Dilaudid  2 mg x 3, IV Toradol  15 mg.  The hospitalist service was consulted for admission.  Review of Systems: All systems reviewed and are negative except as documented in history of present illness above.   Past Medical History:  Diagnosis Date  . Asthma   . GERD (gastroesophageal reflux disease)   . Hb-S/Hb-C disease (HCC) 1979  . HTN (hypertension) 2008  . Hypertension   . Sickle cell anemia (HCC)   . Sickle cell disease (HCC)      Past Surgical History:  Procedure Laterality Date  . CHOLECYSTECTOMY      Social History: Patient reports smoking 5-6 cigarettes daily.  He denies any alcohol use.  He denies any recent illicit drug use.  No Known Allergies  Family History  Problem Relation Age of Onset  . Sickle cell trait Mother   . Hypertension Mother   . Asthma Mother   . Sickle cell trait Father   . Congestive Heart Failure Father   . Hypertension Sister   . Sickle cell anemia Daughter   . Hypertension Daughter   . Diabetes Daughter      Prior to Admission medications   Medication Sig Start Date End Date Taking? Authorizing Provider  albuterol  (VENTOLIN  HFA) 108 (90 Base) MCG/ACT inhaler TAKE 2 PUFFS BY MOUTH EVERY 6 HOURS AS NEEDED FOR WHEEZE OR SHORTNESS OF BREATH 09/05/22   Oley Bascom RAMAN, NP  amLODipine  (NORVASC ) 10 MG tablet Take 1 tablet (10 mg total) by mouth daily. 04/17/22   Oley Bascom RAMAN, NP  cetirizine  (ZYRTEC ) 10 MG tablet TAKE 1 TABLET BY MOUTH EVERY DAY AS NEEDED FOR ALLERGY 04/17/22   Nichols, Tonya S, NP  cloNIDine  (CATAPRES ) 0.1 MG tablet TAKE 1 TABLET BY MOUTH 2 TIMES DAILY. 07/17/22   Oley Bascom RAMAN, NP  DULoxetine  (CYMBALTA ) 30 MG capsule Take 1 capsule (30 mg total) by mouth daily. Patient not taking: Reported on 10/02/2023 08/25/22   Sonjia Held, MD  gabapentin  (NEURONTIN ) 300 MG capsule Take  1 capsule (300 mg total) by mouth 2 (two) times daily. Patient not taking: Reported on 10/02/2023 07/17/22   Nichols, Tonya S, NP  losartan -hydrochlorothiazide  (HYZAAR) 100-25 MG tablet Take 1 tablet by mouth daily. Patient not taking: Reported on 10/02/2023 08/27/22   Pokhrel, Laxman, MD  metoprolol  succinate (TOPROL -XL) 50 MG 24 hr tablet Take 1 tablet (50 mg total) by mouth daily. Take with or immediately following a meal. Patient not taking: Reported on 10/02/2023 04/17/22   Oley Bascom RAMAN, NP  montelukast  (SINGULAIR ) 10 MG tablet TAKE 1 TABLET BY MOUTH EVERYDAY AT BEDTIME 04/17/22   Nichols,  Tonya S, NP  nicotine  (NICODERM CQ  - DOSED IN MG/24 HOURS) 21 mg/24hr patch Place 1 patch (21 mg total) onto the skin daily. 05/06/23   Marry Clamp, MD  pantoprazole  (PROTONIX ) 40 MG tablet Take 1 tablet (40 mg total) by mouth daily. 04/17/22   Nichols, Tonya S, NP  tiZANidine  (ZANAFLEX ) 4 MG tablet Take 1 tablet (4 mg total) by mouth every 6 (six) hours as needed for muscle spasms. 10/13/22   Oley Bascom RAMAN, NP    Physical Exam: Vitals:   10/02/23 2200 10/02/23 2230 10/02/23 2300 10/02/23 2306  BP: (!) 159/107 (!) 150/112 (!) 158/103   Pulse: 79 (!) 58 60   Resp: 15 14 13    Temp:    98.1 F (36.7 C)  TempSrc:    Oral  SpO2: 96% 94% 98%   Weight:      Height:       Constitutional: Resting in bed, NAD, calm, comfortable Eyes: EOMI, lids and conjunctivae normal ENMT: Mucous membranes are moist. Posterior pharynx clear of any exudate or lesions.Normal dentition.  Neck: normal, supple, no masses. Respiratory: clear to auscultation bilaterally, no wheezing, no crackles. Normal respiratory effort. No accessory muscle use.  Cardiovascular: Regular rate and rhythm, no murmurs / rubs / gallops. No extremity edema. 2+ pedal pulses. Abdomen: no tenderness, no masses palpated. Musculoskeletal: no clubbing / cyanosis. No joint deformity upper and lower extremities. Good ROM, no contractures. Normal muscle tone.  Skin: no rashes, lesions, ulcers. No induration Neurologic: Sensation intact. Strength 5/5 in all 4.  Psychiatric: Normal judgment and insight. Alert and oriented x 3. Normal mood.   EKG: Personally reviewed. Sinus rhythm, rate 92, no acute ischemic changes, motion artifact.  Assessment/Plan Principal Problem:   Sickle cell pain crisis (HCC) Active Problems:   Essential hypertension   Cocaine  abuse (HCC)   Mild intermittent asthma without complication   *** No notes on file *** Assessment and Plan: Sickle cell pain crisis: ***  Anemia of chronic disease: Hemoglobin 10.7.   No indication for transfusion.  Continue to monitor.  Chronic pain syndrome: Does not appear the patient is currently on an opioid pain regimen as an outpatient.  Per PDMP last prescription for oxycodone  was filled 04/01/2023.  Has been off Cymbalta  since running out of his meds.  Will resume Cymbalta .***  Mild intermittent asthma: ***  Hypertension: ***  History of cocaine  abuse: ***  Tobacco use: ***   DVT prophylaxis: ***  Code Status: ***  Family Communication: ***  Disposition Plan: From home, dispo pending clinical progress Consults called: None Severity of Illness: {Observation/Inpatient:21159}  Jorie Blanch MD [facility]  If 7PM-7AM, please contact night-coverage [email or web address]  10/03/2023, 12:03 AM

## 2023-10-03 NOTE — Hospital Course (Signed)
 Dean Webb is a 48 y.o. male with medical history significant for sickle cell disease, chronic pain syndrome, anemia of chronic disease, mild intermittent asthma, HTN, GERD, cocaine  abuse, tobacco use who is admitted with sickle cell pain crisis.

## 2023-10-03 NOTE — Plan of Care (Signed)
  Problem: Self-Care: Goal: Ability to incorporate actions that prevent/reduce pain crisis will improve Outcome: Progressing   Problem: Bowel/Gastric: Goal: Gut motility will be maintained Outcome: Progressing   Problem: Respiratory: Goal: Pulmonary complications will be avoided or minimized Outcome: Progressing   Problem: Sensory: Goal: Pain level will decrease with appropriate interventions Outcome: Progressing

## 2023-10-03 NOTE — Progress Notes (Signed)
 Patient ID: Dean Webb, male   DOB: 06/29/1975, 48 y.o.   MRN: [id number] Subjective: Dean Webb is a 48 y.o. male with medical history significant of sickle cell disease, mild intermittent asthma, GERD, hypertension, cocaine  abuse, ongoing smoker presents to the emergency room with about 2 days of bilateral thigh pain, 10/10 no radiation, no swelling.   Patient is endorsing pain of 8/10, slight improvement from yesterday. No new concerns. Denies fever, cough, SOB, N/V/D. No urinary symptoms.  Objective:  Vital signs in last 24 hours:  Vitals:   10/03/23 0015 10/03/23 0043 10/03/23 0516 10/03/23 1013  BP:  (!) 140/97 118/87 (!) 127/92  Pulse:  63 76 93  Resp: 13 20 20 20   Temp:  97.9 F (36.6 C) 97.9 F (36.6 C) 98.6 F (37 C)  TempSrc:    Oral  SpO2:  90% 95% 97%  Weight:      Height:        Intake/Output from previous day:   Intake/Output Summary (Last 24 hours) at 10/03/2023 1235 Last data filed at 10/03/2023 0845 Gross per 24 hour  Intake 50 ml  Output 1225 ml  Net -1175 ml    Physical Exam: General: Alert, awake, oriented x3, in no acute distress.  HEENT: [place]/AT PEERL, EOMI Neck: Trachea midline,  no masses, no thyromegal,y no JVD, no carotid bruit OROPHARYNX:  Moist, No exudate/ erythema/lesions.  Heart: Regular rate and rhythm, without murmurs, rubs, gallops, PMI non-displaced, no heaves or thrills on palpation.  Lungs: Clear to auscultation, no wheezing or rhonchi noted. No increased vocal fremitus resonant to percussion  Abdomen: Soft, nontender, nondistended, positive bowel sounds, no masses no hepatosplenomegaly noted..  Neuro: No focal neurological deficits noted cranial nerves II through XII grossly intact. DTRs 2+ bilaterally upper and lower extremities. Strength 5 out of 5 in bilateral upper and lower extremities. Musculoskeletal:Generalize body tenderness Psychiatric: Patient alert and oriented x3, good insight and cognition, good recent to remote  recall. Lymph node survey: No cervical axillary or inguinal lymphadenopathy noted.  Lab Results:  Basic Metabolic Panel:    Component Value Date/Time   NA 134 (L) 10/03/2023 0543   NA 135 11/09/2022 1416   K 4.0 10/03/2023 0543   CL 103 10/03/2023 0543   CO2 25 10/03/2023 0543   BUN 12 10/03/2023 0543   BUN 13 11/09/2022 1416   CREATININE 0.81 10/03/2023 0543   GLUCOSE 125 (H) 10/03/2023 0543   CALCIUM 10.3 10/03/2023 0543   CBC:    Component Value Date/Time   WBC 14.1 (H) 10/03/2023 0543   HGB 10.5 (L) 10/03/2023 0543   HGB 14.4 11/09/2022 1416   HCT 28.7 (L) 10/03/2023 0543   HCT 41.0 11/09/2022 1416   PLT 321 10/03/2023 0543   PLT 303 11/09/2022 1416   MCV 80.2 10/03/2023 0543   MCV 88 11/09/2022 1416   NEUTROABS 7.0 10/02/2023 1931   NEUTROABS 6.7 11/09/2022 1416   LYMPHSABS 2.6 10/02/2023 1931   LYMPHSABS 4.2 (H) 11/09/2022 1416   MONOABS 1.4 (H) 10/02/2023 1931   EOSABS 0.6 (H) 10/02/2023 1931   EOSABS 0.4 11/09/2022 1416   BASOSABS 0.1 10/02/2023 1931   BASOSABS 0.1 11/09/2022 1416    Recent Results (from the past 240 hours)  Resp panel by RT-PCR (RSV, Flu A&B, Covid) Anterior Nasal Swab     Status: None   Collection Time: 10/02/23  7:42 PM   Specimen: Anterior Nasal Swab  Result Value Ref Range Status   SARS Coronavirus 2  by RT PCR NEGATIVE NEGATIVE Final    Comment: (NOTE) SARS-CoV-2 target nucleic acids are NOT DETECTED.  The SARS-CoV-2 RNA is generally detectable in upper respiratory specimens during the acute phase of infection. The lowest concentration of SARS-CoV-2 viral copies this assay can detect is 138 copies/mL. A negative result does not preclude SARS-Cov-2 infection and should not be used as the sole basis for treatment or other patient management decisions. A negative result may occur with  improper specimen collection/handling, submission of specimen other than nasopharyngeal swab, presence of viral mutation(s) within the areas  targeted by this assay, and inadequate number of viral copies(<138 copies/mL). A negative result must be combined with clinical observations, patient history, and epidemiological information. The expected result is Negative.  Fact Sheet for Patients:  BloggerCourse.com  Fact Sheet for Healthcare Providers:  SeriousBroker.it  This test is no t yet approved or cleared by the [place]  FDA and  has been authorized for detection and/or diagnosis of SARS-CoV-2 by FDA under an Emergency Use Authorization (EUA). This EUA will remain  in effect (meaning this test can be used) for the duration of the COVID-19 declaration under Section 564(b)(1) of the Act, 21 U.S.C.section 360bbb-3(b)(1), unless the authorization is terminated  or revoked sooner.       Influenza A by PCR NEGATIVE NEGATIVE Final   Influenza B by PCR NEGATIVE NEGATIVE Final    Comment: (NOTE) The Xpert Xpress SARS-CoV-2/FLU/RSV plus assay is intended as an aid in the diagnosis of influenza from Nasopharyngeal swab specimens and should not be used as a sole basis for treatment. Nasal washings and aspirates are unacceptable for Xpert Xpress SARS-CoV-2/FLU/RSV testing.  Fact Sheet for Patients: BloggerCourse.com  Fact Sheet for Healthcare Providers: SeriousBroker.it  This test is not yet approved or cleared by the [place]  FDA and has been authorized for detection and/or diagnosis of SARS-CoV-2 by FDA under an Emergency Use Authorization (EUA). This EUA will remain in effect (meaning this test can be used) for the duration of the COVID-19 declaration under Section 564(b)(1) of the Act, 21 U.S.C. section 360bbb-3(b)(1), unless the authorization is terminated or revoked.     Resp Syncytial Virus by PCR NEGATIVE NEGATIVE Final    Comment: (NOTE) Fact Sheet for  Patients: BloggerCourse.com  Fact Sheet for Healthcare Providers: SeriousBroker.it  This test is not yet approved or cleared by the [place]  FDA and has been authorized for detection and/or diagnosis of SARS-CoV-2 by FDA under an Emergency Use Authorization (EUA). This EUA will remain in effect (meaning this test can be used) for the duration of the COVID-19 declaration under Section 564(b)(1) of the Act, 21 U.S.C. section 360bbb-3(b)(1), unless the authorization is terminated or revoked.  Performed at [facility], [address], [place], [place] [address]     Studies/Results: DG Chest 2 View Result Date: 10/02/2023 CLINICAL DATA:  Chest pain EXAM: CHEST - 2 VIEW COMPARISON:  04/01/2023 FINDINGS: Heart and mediastinal contours are within normal limits. No focal opacities or effusions. No acute bony abnormality. IMPRESSION: No active cardiopulmonary disease. Electronically Signed   By: Franky Crease M.D.   On: 10/02/2023 19:31    Medications: Scheduled Meds:  amLODipine   10 mg Oral Daily   cloNIDine   0.1 mg Oral BID   DULoxetine   30 mg Oral Daily   enoxaparin  (LOVENOX ) injection  40 mg Subcutaneous Q24H   ketorolac   15 mg Intravenous Q6H   loratadine   10 mg Oral Daily   montelukast   10 mg  Oral QHS   nicotine   14 mg Transdermal Daily   pantoprazole   40 mg Oral Daily   senna-docusate  1 tablet Oral BID   Continuous Infusions:   PRN Meds:.acetaminophen , albuterol , guaiFENesin , HYDROmorphone  (DILAUDID ) injection, hydrOXYzine , ondansetron  **OR** ondansetron  (ZOFRAN ) IV, oxyCODONE , polyethylene glycol  Consultants: None  Procedures: None  Antibiotics: None  Assessment/Plan: Principal Problem:   Sickle cell pain crisis (HCC) Active Problems:   Essential hypertension   Cocaine  abuse (HCC)   Mild intermittent asthma without complication   Hb Sickle Cell Disease with Pain crisis: Continue IVF  0.45% Saline @ KVO,  Dilaudid  1 mg every 3 hours as needed for pain. IV Toradol  15 mg Q 6 H for a total of 5 days, continue oral home pain medications as ordered. Monitor vitals very closely, Re-evaluate pain scale regularly, 2 L of Oxygen by [place]. Patient encouraged to ambulate on the hallway today.  Leukocytosis: Stable Anemia of Chronic Disease: Hemoglobin is within patients baseline, no medical reason  for transfusion, will continue to monitor daily CBC. Chronic pain Syndrome: Resume home regimen  Constipation: continue Lactulose  as needed Essential hypertension: Blood pressure is controlled.  Continue to monitor  Code Status: Full Code Family Communication: N/A Disposition Plan: Ready for discharge in the a.m. Homer CHRISTELLA Cover NP  If 7PM-7AM, please contact night-coverage.  10/03/2023, 12:35 PM  LOS: 1 day

## 2023-10-03 NOTE — Plan of Care (Signed)
   Problem: Nutrition: Goal: Adequate nutrition will be maintained Outcome: Progressing   Problem: Coping: Goal: Level of anxiety will decrease Outcome: Progressing   Problem: Elimination: Goal: Will not experience complications related to bowel motility Outcome: Progressing

## 2023-10-04 LAB — CBC
HCT: 29.5 % — ABNORMAL LOW (ref 39.0–52.0)
Hemoglobin: 10.8 g/dL — ABNORMAL LOW (ref 13.0–17.0)
MCH: 29.3 pg (ref 26.0–34.0)
MCHC: 36.6 g/dL — ABNORMAL HIGH (ref 30.0–36.0)
MCV: 79.9 fL — ABNORMAL LOW (ref 80.0–100.0)
Platelets: 342 K/uL (ref 150–400)
RBC: 3.69 MIL/uL — ABNORMAL LOW (ref 4.22–5.81)
RDW: 14.6 % (ref 11.5–15.5)
WBC: 10.6 K/uL — ABNORMAL HIGH (ref 4.0–10.5)
nRBC: 1.9 % — ABNORMAL HIGH (ref 0.0–0.2)

## 2023-10-04 MED ORDER — HYDROMORPHONE HCL 1 MG/ML IJ SOLN
1.0000 mg | INTRAMUSCULAR | Status: DC | PRN
  Administered 2023-10-04 – 2023-10-07 (×10): 1 mg via INTRAVENOUS
  Filled 2023-10-04 (×11): qty 1

## 2023-10-04 MED ORDER — DM-GUAIFENESIN ER 30-600 MG PO TB12
1.0000 | ORAL_TABLET | Freq: Two times a day (BID) | ORAL | Status: DC
  Administered 2023-10-04 – 2023-10-07 (×7): 1 via ORAL
  Filled 2023-10-04 (×7): qty 1

## 2023-10-04 NOTE — Progress Notes (Signed)
   10/04/23 1052  TOC Brief Assessment  Insurance and Status Reviewed  Patient has primary care physician Yes Brenita, Bascom RAMAN, NP)  Home environment has been reviewed Home  Prior level of function: Independent  Prior/Current Home Services No current home services  Social Drivers of Health Review SDOH reviewed no interventions necessary  Readmission risk has been reviewed Yes  Transition of care needs no transition of care needs at this time

## 2023-10-04 NOTE — Plan of Care (Signed)
   Problem: Education: Goal: Knowledge of vaso-occlusive preventative measures will improve Outcome: Progressing Goal: Awareness of infection prevention will improve Outcome: Progressing Goal: Awareness of signs and symptoms of anemia will improve Outcome: Progressing Goal: Long-term complications will improve Outcome: Progressing

## 2023-10-04 NOTE — Plan of Care (Signed)
  Problem: Education: Goal: Awareness of signs and symptoms of anemia will improve Outcome: Progressing Goal: Long-term complications will improve Outcome: Progressing   Problem: Self-Care: Goal: Ability to incorporate actions that prevent/reduce pain crisis will improve Outcome: Progressing   Problem: Tissue Perfusion: Goal: Complications related to inadequate tissue perfusion will be avoided or minimized Outcome: Progressing   Problem: Respiratory: Goal: Pulmonary complications will be avoided or minimized Outcome: Progressing

## 2023-10-04 NOTE — Progress Notes (Signed)
 Patient ID: Dean Webb, male   DOB: [birth year], 48 y.o.   MRN: [id number] Subjective: Dean Webb is a 48 y.o. male with medical history significant of sickle cell disease, mild intermittent asthma, GERD, hypertension, cocaine  abuse, ongoing smoker presents to the emergency room with about 2 days of bilateral thigh pain, 10/10 no radiation, no swelling.   Patient is endorsing pain of 7/10, slight improvement from yesterday. Slight congested cough present, Denies fever, SOB, N/V/D. No urinary symptoms.  Objective:  Vital signs in last 24 hours:  Vitals:   10/03/23 1414 10/03/23 2039 10/04/23 0508 10/04/23 0956  BP: (!) 153/96 (!) 139/90 130/81 127/77  Pulse: 84 68 69 74  Resp: 17 18 18 18   Temp: 98.5 F (36.9 C) 98.6 F (37 C) 98.4 F (36.9 C) 98.8 F (37.1 C)  TempSrc: Oral Oral Oral Oral  SpO2: 99% 95% 99% 98%  Weight:      Height:        Intake/Output from previous day:   Intake/Output Summary (Last 24 hours) at 10/04/2023 1309 Last data filed at 10/04/2023 1006 Gross per 24 hour  Intake 240 ml  Output 2800 ml  Net -2560 ml    Physical Exam: General: Alert, awake, oriented x3, in no acute distress.  HEENT: [place]/AT PEERL, EOMI Neck: Trachea midline,  no masses, no thyromegal,y no JVD, no carotid bruit OROPHARYNX:  Moist, No exudate/ erythema/lesions.  Heart: Regular rate and rhythm, without murmurs, rubs, gallops, PMI non-displaced, no heaves or thrills on palpation.  Lungs: Clear to auscultation, no wheezing or rhonchi noted. No increased vocal fremitus resonant to percussion  Abdomen: Soft, nontender, nondistended, positive bowel sounds, no masses no hepatosplenomegaly noted..  Neuro: No focal neurological deficits noted cranial nerves II through XII grossly intact. DTRs 2+ bilaterally upper and lower extremities. Strength 5 out of 5 in bilateral upper and lower extremities. Musculoskeletal:Generalize body tenderness Psychiatric: Patient alert and oriented x3,  good insight and cognition, good recent to remote recall. Lymph node survey: No cervical axillary or inguinal lymphadenopathy noted.  Lab Results:  Basic Metabolic Panel:    Component Value Date/Time   NA 134 (L) 10/03/2023 0543   NA 135 11/09/2022 1416   K 4.0 10/03/2023 0543   CL 103 10/03/2023 0543   CO2 25 10/03/2023 0543   BUN 12 10/03/2023 0543   BUN 13 11/09/2022 1416   CREATININE 0.81 10/03/2023 0543   GLUCOSE 125 (H) 10/03/2023 0543   CALCIUM 10.3 10/03/2023 0543   CBC:    Component Value Date/Time   WBC 10.6 (H) 10/04/2023 0621   HGB 10.8 (L) 10/04/2023 0621   HGB 14.4 11/09/2022 1416   HCT 29.5 (L) 10/04/2023 0621   HCT 41.0 11/09/2022 1416   PLT 342 10/04/2023 0621   PLT 303 11/09/2022 1416   MCV 79.9 (L) 10/04/2023 0621   MCV 88 11/09/2022 1416   NEUTROABS 7.0 10/02/2023 1931   NEUTROABS 6.7 11/09/2022 1416   LYMPHSABS 2.6 10/02/2023 1931   LYMPHSABS 4.2 (H) 11/09/2022 1416   MONOABS 1.4 (H) 10/02/2023 1931   EOSABS 0.6 (H) 10/02/2023 1931   EOSABS 0.4 11/09/2022 1416   BASOSABS 0.1 10/02/2023 1931   BASOSABS 0.1 11/09/2022 1416    Recent Results (from the past 240 hours)  Resp panel by RT-PCR (RSV, Flu A&B, Covid) Anterior Nasal Swab     Status: None   Collection Time: 10/02/23  7:42 PM   Specimen: Anterior Nasal Swab  Result Value Ref Range Status  SARS Coronavirus 2 by RT PCR NEGATIVE NEGATIVE Final    Comment: (NOTE) SARS-CoV-2 target nucleic acids are NOT DETECTED.  The SARS-CoV-2 RNA is generally detectable in upper respiratory specimens during the acute phase of infection. The lowest concentration of SARS-CoV-2 viral copies this assay can detect is 138 copies/mL. A negative result does not preclude SARS-Cov-2 infection and should not be used as the sole basis for treatment or other patient management decisions. A negative result may occur with  improper specimen collection/handling, submission of specimen other than nasopharyngeal swab,  presence of viral mutation(s) within the areas targeted by this assay, and inadequate number of viral copies(<138 copies/mL). A negative result must be combined with clinical observations, patient history, and epidemiological information. The expected result is Negative.  Fact Sheet for Patients:  BloggerCourse.com  Fact Sheet for Healthcare Providers:  SeriousBroker.it  This test is no t yet approved or cleared by the [place]  FDA and  has been authorized for detection and/or diagnosis of SARS-CoV-2 by FDA under an Emergency Use Authorization (EUA). This EUA will remain  in effect (meaning this test can be used) for the duration of the COVID-19 declaration under Section 564(b)(1) of the Act, 21 U.S.C.section 360bbb-3(b)(1), unless the authorization is terminated  or revoked sooner.       Influenza A by PCR NEGATIVE NEGATIVE Final   Influenza B by PCR NEGATIVE NEGATIVE Final    Comment: (NOTE) The Xpert Xpress SARS-CoV-2/FLU/RSV plus assay is intended as an aid in the diagnosis of influenza from Nasopharyngeal swab specimens and should not be used as a sole basis for treatment. Nasal washings and aspirates are unacceptable for Xpert Xpress SARS-CoV-2/FLU/RSV testing.  Fact Sheet for Patients: BloggerCourse.com  Fact Sheet for Healthcare Providers: SeriousBroker.it  This test is not yet approved or cleared by the [place]  FDA and has been authorized for detection and/or diagnosis of SARS-CoV-2 by FDA under an Emergency Use Authorization (EUA). This EUA will remain in effect (meaning this test can be used) for the duration of the COVID-19 declaration under Section 564(b)(1) of the Act, 21 U.S.C. section 360bbb-3(b)(1), unless the authorization is terminated or revoked.     Resp Syncytial Virus by PCR NEGATIVE NEGATIVE Final    Comment: (NOTE) Fact Sheet for  Patients: BloggerCourse.com  Fact Sheet for Healthcare Providers: SeriousBroker.it  This test is not yet approved or cleared by the [place]  FDA and has been authorized for detection and/or diagnosis of SARS-CoV-2 by FDA under an Emergency Use Authorization (EUA). This EUA will remain in effect (meaning this test can be used) for the duration of the COVID-19 declaration under Section 564(b)(1) of the Act, 21 U.S.C. section 360bbb-3(b)(1), unless the authorization is terminated or revoked.  Performed at [facility], [address] [place], [place] [address]     Studies/Results: DG Chest 2 View Result Date: 10/02/2023 CLINICAL DATA:  Chest pain EXAM: CHEST - 2 VIEW COMPARISON:  04/01/2023 FINDINGS: Heart and mediastinal contours are within normal limits. No focal opacities or effusions. No acute bony abnormality. IMPRESSION: No active cardiopulmonary disease. Electronically Signed   By: Franky Crease M.D.   On: 10/02/2023 19:31    Medications: Scheduled Meds:  amLODipine   10 mg Oral Daily   cloNIDine   0.1 mg Oral BID   dextromethorphan -guaiFENesin   1 tablet Oral BID   DULoxetine   30 mg Oral Daily   enoxaparin  (LOVENOX ) injection  40 mg Subcutaneous Q24H   ketorolac   15 mg Intravenous Q6H   loratadine   10 mg Oral Daily   montelukast   10 mg Oral QHS   nicotine   14 mg Transdermal Daily   pantoprazole   40 mg Oral Daily   senna-docusate  1 tablet Oral BID   Continuous Infusions:   PRN Meds:.acetaminophen , albuterol , guaiFENesin , HYDROmorphone  (DILAUDID ) injection, hydrOXYzine , lip balm, ondansetron  **OR** ondansetron  (ZOFRAN ) IV, oxyCODONE , polyethylene glycol  Consultants: None  Procedures: None  Antibiotics: None  Assessment/Plan: Principal Problem:   Sickle cell pain crisis (HCC) Active Problems:   Essential hypertension   Cocaine  abuse (HCC)   Acute cough   Mild intermittent asthma  without complication   Hb Sickle Cell Disease with Pain crisis: Continue IVF 0.45% Saline @ KVO,  Dilaudid  1 mg every 3 hours as needed for pain. IV Toradol  15 mg Q 6 H for a total of 5 days, continue oral home pain medications as ordered. Monitor vitals very closely, Re-evaluate pain scale regularly, 2 L of Oxygen by [place]. Patient encouraged to ambulate on the hallway today.  Leukocytosis: Stable Anemia of Chronic Disease: Hemoglobin is within patients baseline, no medical reason  for transfusion, will continue to monitor daily CBC. Chronic pain Syndrome: Resume home regimen  Constipation: continue Lactulose  as needed Essential hypertension: Blood pressure is controlled.  Continue to monitor Acute cough: Start dextromethorphan -guaiFENesin  (MUCINEX  DM) 30-600 MG per 12 hr tablet 1 tablet      Code Status: Full Code Family Communication: N/A Disposition Plan: Ready for discharge in the a.m. Homer CHRISTELLA Cover NP  If 7PM-7AM, please contact night-coverage.  10/04/2023, 1:09 PM  LOS: 2 days

## 2023-10-04 NOTE — Plan of Care (Signed)

## 2023-10-05 LAB — CBC
HCT: 28.7 % — ABNORMAL LOW (ref 39.0–52.0)
Hemoglobin: 10.4 g/dL — ABNORMAL LOW (ref 13.0–17.0)
MCH: 29.1 pg (ref 26.0–34.0)
MCHC: 36.2 g/dL — ABNORMAL HIGH (ref 30.0–36.0)
MCV: 80.2 fL (ref 80.0–100.0)
Platelets: 341 K/uL (ref 150–400)
RBC: 3.58 MIL/uL — ABNORMAL LOW (ref 4.22–5.81)
RDW: 14.7 % (ref 11.5–15.5)
WBC: 17.8 K/uL — ABNORMAL HIGH (ref 4.0–10.5)
nRBC: 1 % — ABNORMAL HIGH (ref 0.0–0.2)

## 2023-10-05 NOTE — Progress Notes (Signed)
 Patient ID: Dean Webb, male   DOB: January 12, 1976, 48 y.o.   MRN: [id number] Subjective: Dean Webb is a 48 y.o. male with medical history significant of sickle cell disease, mild intermittent asthma, GERD, hypertension, cocaine  abuse, ongoing smoker presents to the emergency room with about 2 days of bilateral thigh pain, 10/10 no radiation, no swelling.  Patient is endorsing pain of 6-8/10, slight improvement from yesterday. Denies fever, SOB, N/V/D. No urinary symptoms.  Objective:  Vital signs in last 24 hours:  Vitals:   10/04/23 1822 10/04/23 2150 10/05/23 0230 10/05/23 0640  BP: (!) 143/93 130/87 122/86 120/86  Pulse: 71 69 66 80  Resp:  18 18 20   Temp: 98.7 F (37.1 C) 98.6 F (37 C) 98.5 F (36.9 C) 98.5 F (36.9 C)  TempSrc: Oral Oral Oral Oral  SpO2: 100% 98% 100% 99%  Weight:      Height:        Intake/Output from previous day:   Intake/Output Summary (Last 24 hours) at 10/05/2023 1054 Last data filed at 10/05/2023 0900 Gross per 24 hour  Intake 1440 ml  Output 1975 ml  Net -535 ml    Physical Exam: General: Alert, awake, oriented x3, in no acute distress.  HEENT: [place]/AT [place]ERL, EOMI Neck: Trachea midline,  no masses, no thyromegal,y no JVD, no carotid bruit OROPHARYNX:  Moist, No exudate/ erythema/lesions.  Heart: Regular rate and rhythm, without murmurs, rubs, gallops, PMI non-displaced, no heaves or thrills on palpation.  Lungs: Clear to auscultation, no wheezing or rhonchi noted. No increased vocal fremitus resonant to percussion  Abdomen: Soft, nontender, nondistended, positive bowel sounds, no masses no hepatosplenomegaly noted..  Neuro: No focal neurological deficits noted cranial nerves II through XII grossly intact. DTRs 2+ bilaterally upper and lower extremities. Strength 5 out of 5 in bilateral upper and lower extremities. Musculoskeletal:Generalize body tenderness Psychiatric: Patient alert and oriented x3, good insight and cognition, good  recent to remote recall. Lymph node survey: No cervical axillary or inguinal lymphadenopathy noted.  Lab Results:  Basic Metabolic Panel:    Component Value Date/Time   NA 134 (L) 10/03/2023 0543   NA 135 11/09/2022 1416   K 4.0 10/03/2023 0543   CL 103 10/03/2023 0543   CO2 25 10/03/2023 0543   BUN 12 10/03/2023 0543   BUN 13 11/09/2022 1416   CREATININE 0.81 10/03/2023 0543   GLUCOSE 125 (H) 10/03/2023 0543   CALCIUM 10.3 10/03/2023 0543   CBC:    Component Value Date/Time   WBC 17.8 (H) 10/05/2023 0625   HGB 10.4 (L) 10/05/2023 0625   HGB 14.4 11/09/2022 1416   HCT 28.7 (L) 10/05/2023 0625   HCT 41.0 11/09/2022 1416   PLT 341 10/05/2023 0625   PLT 303 11/09/2022 1416   MCV 80.2 10/05/2023 0625   MCV 88 11/09/2022 1416   NEUTROABS 7.0 10/02/2023 1931   NEUTROABS 6.7 11/09/2022 1416   LYMPHSABS 2.6 10/02/2023 1931   LYMPHSABS 4.2 (H) 11/09/2022 1416   MONOABS 1.4 (H) 10/02/2023 1931   EOSABS 0.6 (H) 10/02/2023 1931   EOSABS 0.4 11/09/2022 1416   BASOSABS 0.1 10/02/2023 1931   BASOSABS 0.1 11/09/2022 1416    Recent Results (from the past 240 hours)  Resp panel by RT-PCR (RSV, Flu A&B, Covid) Anterior Nasal Swab     Status: None   Collection Time: 10/02/23  7:42 PM   Specimen: Anterior Nasal Swab  Result Value Ref Range Status   SARS Coronavirus 2 by RT PCR  NEGATIVE NEGATIVE Final    Comment: (NOTE) SARS-CoV-2 target nucleic acids are NOT DETECTED.  The SARS-CoV-2 RNA is generally detectable in upper respiratory specimens during the acute phase of infection. The lowest concentration of SARS-CoV-2 viral copies this assay can detect is 138 copies/mL. A negative result does not preclude SARS-Cov-2 infection and should not be used as the sole basis for treatment or other patient management decisions. A negative result may occur with  improper specimen collection/handling, submission of specimen other than nasopharyngeal swab, presence of viral mutation(s) within  the areas targeted by this assay, and inadequate number of viral copies(<138 copies/mL). A negative result must be combined with clinical observations, patient history, and epidemiological information. The expected result is Negative.  Fact Sheet for Patients:  BloggerCourse.com  Fact Sheet for Healthcare Providers:  SeriousBroker.it  This test is no t yet approved or cleared by the [place]  FDA and  has been authorized for detection and/or diagnosis of SARS-CoV-2 by FDA under an Emergency Use Authorization (EUA). This EUA will remain  in effect (meaning this test can be used) for the duration of the COVID-19 declaration under Section 564(b)(1) of the Act, 21 U.S.C.section 360bbb-3(b)(1), unless the authorization is terminated  or revoked sooner.       Influenza A by PCR NEGATIVE NEGATIVE Final   Influenza B by PCR NEGATIVE NEGATIVE Final    Comment: (NOTE) The Xpert Xpress SARS-CoV-2/FLU/RSV plus assay is intended as an aid in the diagnosis of influenza from Nasopharyngeal swab specimens and should not be used as a sole basis for treatment. Nasal washings and aspirates are unacceptable for Xpert Xpress SARS-CoV-2/FLU/RSV testing.  Fact Sheet for Patients: BloggerCourse.com  Fact Sheet for Healthcare Providers: SeriousBroker.it  This test is not yet approved or cleared by the [place]  FDA and has been authorized for detection and/or diagnosis of SARS-CoV-2 by FDA under an Emergency Use Authorization (EUA). This EUA will remain in effect (meaning this test can be used) for the duration of the COVID-19 declaration under Section 564(b)(1) of the Act, 21 U.S.C. section 360bbb-3(b)(1), unless the authorization is terminated or revoked.     Resp Syncytial Virus by PCR NEGATIVE NEGATIVE Final    Comment: (NOTE) Fact Sheet for  Patients: BloggerCourse.com  Fact Sheet for Healthcare Providers: SeriousBroker.it  This test is not yet approved or cleared by the [place]  FDA and has been authorized for detection and/or diagnosis of SARS-CoV-2 by FDA under an Emergency Use Authorization (EUA). This EUA will remain in effect (meaning this test can be used) for the duration of the COVID-19 declaration under Section 564(b)(1) of the Act, 21 U.S.C. section 360bbb-3(b)(1), unless the authorization is terminated or revoked.  Performed at [facility], [address], [place], [place] [address]     Studies/Results: No results found.   Medications: Scheduled Meds:  amLODipine   10 mg Oral Daily   cloNIDine   0.1 mg Oral BID   dextromethorphan -guaiFENesin   1 tablet Oral BID   DULoxetine   30 mg Oral Daily   enoxaparin  (LOVENOX ) injection  40 mg Subcutaneous Q24H   ketorolac   15 mg Intravenous Q6H   loratadine   10 mg Oral Daily   montelukast   10 mg Oral QHS   nicotine   14 mg Transdermal Daily   pantoprazole   40 mg Oral Daily   senna-docusate  1 tablet Oral BID   Continuous Infusions:   PRN Meds:.acetaminophen , albuterol , guaiFENesin , HYDROmorphone  (DILAUDID ) injection, hydrOXYzine , lip balm, ondansetron  **OR** ondansetron  (ZOFRAN ) IV, oxyCODONE , polyethylene glycol  Consultants: None  Procedures: None  Antibiotics: None  Assessment/Plan: Principal Problem:   Sickle cell pain crisis (HCC) Active Problems:   Essential hypertension   Cocaine  abuse (HCC)   Acute cough   Mild intermittent asthma without complication   Hb Sickle Cell Disease with Pain crisis: Continue IVF 0.45% Saline @ KVO,  Dilaudid  1 mg every 3 hours as needed for pain. IV Toradol  15 mg Q 6 H for a total of 5 days, continue oral home pain medications as ordered. Monitor vitals very closely, Re-evaluate pain scale regularly, 2 L of Oxygen by [place]. Patient encouraged  to ambulate on the hallway today.  Leukocytosis: Stable Anemia of Chronic Disease: Hemoglobin is within patients baseline, no medical reason  for transfusion, will continue to monitor daily CBC. Chronic pain Syndrome: Resume home regimen  Constipation: continue Lactulose  as needed Essential hypertension: Blood pressure is controlled.  Continue to monitor Acute cough: continue dextromethorphan -guaiFENesin  (MUCINEX  DM) 30-600 MG per 12 hr tablet 1 tablet      Code Status: Full Code Family Communication: N/A Disposition Plan: Ready for discharge in the a.m. Homer CHRISTELLA Cover NP  If 7PM-7AM, please contact night-coverage.  10/05/2023, 10:54 AM  LOS: 3 days

## 2023-10-05 NOTE — Plan of Care (Signed)

## 2023-10-06 DIAGNOSIS — D5700 Hb-SS disease with crisis, unspecified: Secondary | ICD-10-CM | POA: Diagnosis not present

## 2023-10-06 LAB — CBC
HCT: 28.3 % — ABNORMAL LOW (ref 39.0–52.0)
Hemoglobin: 10.5 g/dL — ABNORMAL LOW (ref 13.0–17.0)
MCH: 29.3 pg (ref 26.0–34.0)
MCHC: 37.1 g/dL — ABNORMAL HIGH (ref 30.0–36.0)
MCV: 79.1 fL — ABNORMAL LOW (ref 80.0–100.0)
Platelets: 380 K/uL (ref 150–400)
RBC: 3.58 MIL/uL — ABNORMAL LOW (ref 4.22–5.81)
RDW: 14.6 % (ref 11.5–15.5)
WBC: 10.6 K/uL — ABNORMAL HIGH (ref 4.0–10.5)
nRBC: 1.2 % — ABNORMAL HIGH (ref 0.0–0.2)

## 2023-10-06 MED ORDER — ORAL CARE MOUTH RINSE: 15.0000 mL | OROMUCOSAL | Status: DC | PRN

## 2023-10-06 NOTE — Plan of Care (Signed)

## 2023-10-06 NOTE — Progress Notes (Signed)
 Patient ID: Dean Webb, male   DOB: [birth year], 48 y.o.   MRN: [id number] Subjective: Dean Webb is a 48 y.o. male with medical history significant of sickle cell disease, mild intermittent asthma, GERD, hypertension, cocaine  abuse, ongoing smoker presents to the emergency room with about 2 days of bilateral thigh pain, was admitted for sickle cell pain crisis.    Patient continues to complain of significant pain, saying his pain is a 7/10 this morning.  His goal is a pain of less than 5/10.  He denies any fever, chest pain, shortness of breath, nausea, vomiting or diarrhea.  No urinary symptoms.    Objective:  Vital signs in last 24 hours:  Vitals:   10/06/23 0211 10/06/23 0627 10/06/23 1025 10/06/23 1437  BP: 124/85 132/84 (!) 125/92 125/85  Pulse: (!) 58 64 66 63  Resp: 17 20 15 15   Temp: 97.9 F (36.6 C) 97.8 F (36.6 C) 97.8 F (36.6 C) 98 F (36.7 C)  TempSrc: Oral Oral Oral Oral  SpO2: 99% 100% 100% 99%  Weight:      Height:        Intake/Output from previous day:   Intake/Output Summary (Last 24 hours) at 10/06/2023 1540 Last data filed at 10/05/2023 1840 Gross per 24 hour  Intake 240 ml  Output --  Net 240 ml    Physical Exam: General: Alert, awake, oriented x3, in no acute distress.  HEENT: [place]/AT PEERL, EOMI Neck: Trachea midline,  no masses, no thyromegal,y no JVD, no carotid bruit OROPHARYNX:  Moist, No exudate/ erythema/lesions.  Heart: Regular rate and rhythm, without murmurs, rubs, gallops, PMI non-displaced, no heaves or thrills on palpation.  Lungs: Clear to auscultation, no wheezing or rhonchi noted. No increased vocal fremitus resonant to percussion  Abdomen: Soft, nontender, nondistended, positive bowel sounds, no masses no hepatosplenomegaly noted..  Neuro: No focal neurological deficits noted cranial nerves II through XII grossly intact. DTRs 2+ bilaterally upper and lower extremities. Strength 5 out of 5 in bilateral upper and lower  extremities. Musculoskeletal:Generalize body tenderness Psychiatric: Patient alert and oriented x3, good insight and cognition, good recent to remote recall. Lymph node survey: No cervical axillary or inguinal lymphadenopathy noted.  Lab Results:  Basic Metabolic Panel:    Component Value Date/Time   NA 134 (L) 10/03/2023 0543   NA 135 11/09/2022 1416   K 4.0 10/03/2023 0543   CL 103 10/03/2023 0543   CO2 25 10/03/2023 0543   BUN 12 10/03/2023 0543   BUN 13 11/09/2022 1416   CREATININE 0.81 10/03/2023 0543   GLUCOSE 125 (H) 10/03/2023 0543   CALCIUM 10.3 10/03/2023 0543   CBC:    Component Value Date/Time   WBC 10.6 (H) 10/06/2023 0809   HGB 10.5 (L) 10/06/2023 0809   HGB 14.4 11/09/2022 1416   HCT 28.3 (L) 10/06/2023 0809   HCT 41.0 11/09/2022 1416   PLT 380 10/06/2023 0809   PLT 303 11/09/2022 1416   MCV 79.1 (L) 10/06/2023 0809   MCV 88 11/09/2022 1416   NEUTROABS 7.0 10/02/2023 1931   NEUTROABS 6.7 11/09/2022 1416   LYMPHSABS 2.6 10/02/2023 1931   LYMPHSABS 4.2 (H) 11/09/2022 1416   MONOABS 1.4 (H) 10/02/2023 1931   EOSABS 0.6 (H) 10/02/2023 1931   EOSABS 0.4 11/09/2022 1416   BASOSABS 0.1 10/02/2023 1931   BASOSABS 0.1 11/09/2022 1416    Recent Results (from the past 240 hours)  Resp panel by RT-PCR (RSV, Flu A&B, Covid) Anterior Nasal Swab  Status: None   Collection Time: 10/02/23  7:42 PM   Specimen: Anterior Nasal Swab  Result Value Ref Range Status   SARS Coronavirus 2 by RT PCR NEGATIVE NEGATIVE Final    Comment: (NOTE) SARS-CoV-2 target nucleic acids are NOT DETECTED.  The SARS-CoV-2 RNA is generally detectable in upper respiratory specimens during the acute phase of infection. The lowest concentration of SARS-CoV-2 viral copies this assay can detect is 138 copies/mL. A negative result does not preclude SARS-Cov-2 infection and should not be used as the sole basis for treatment or other patient management decisions. A negative result may occur  with  improper specimen collection/handling, submission of specimen other than nasopharyngeal swab, presence of viral mutation(s) within the areas targeted by this assay, and inadequate number of viral copies(<138 copies/mL). A negative result must be combined with clinical observations, patient history, and epidemiological information. The expected result is Negative.  Fact Sheet for Patients:  BloggerCourse.com  Fact Sheet for Healthcare Providers:  SeriousBroker.it  This test is no t yet approved or cleared by the [place]  FDA and  has been authorized for detection and/or diagnosis of SARS-CoV-2 by FDA under an Emergency Use Authorization (EUA). This EUA will remain  in effect (meaning this test can be used) for the duration of the COVID-19 declaration under Section 564(b)(1) of the Act, 21 U.S.C.section 360bbb-3(b)(1), unless the authorization is terminated  or revoked sooner.       Influenza A by PCR NEGATIVE NEGATIVE Final   Influenza B by PCR NEGATIVE NEGATIVE Final    Comment: (NOTE) The Xpert Xpress SARS-CoV-2/FLU/RSV plus assay is intended as an aid in the diagnosis of influenza from Nasopharyngeal swab specimens and should not be used as a sole basis for treatment. Nasal washings and aspirates are unacceptable for Xpert Xpress SARS-CoV-2/FLU/RSV testing.  Fact Sheet for Patients: BloggerCourse.com  Fact Sheet for Healthcare Providers: SeriousBroker.it  This test is not yet approved or cleared by the [place]  FDA and has been authorized for detection and/or diagnosis of SARS-CoV-2 by FDA under an Emergency Use Authorization (EUA). This EUA will remain in effect (meaning this test can be used) for the duration of the COVID-19 declaration under Section 564(b)(1) of the Act, 21 U.S.C. section 360bbb-3(b)(1), unless the authorization is terminated  or revoked.     Resp Syncytial Virus by PCR NEGATIVE NEGATIVE Final    Comment: (NOTE) Fact Sheet for Patients: BloggerCourse.com  Fact Sheet for Healthcare Providers: SeriousBroker.it  This test is not yet approved or cleared by the [place]  FDA and has been authorized for detection and/or diagnosis of SARS-CoV-2 by FDA under an Emergency Use Authorization (EUA). This EUA will remain in effect (meaning this test can be used) for the duration of the COVID-19 declaration under Section 564(b)(1) of the Act, 21 U.S.C. section 360bbb-3(b)(1), unless the authorization is terminated or revoked.  Performed at [facility], [address] [place], [place] [address]     Studies/Results: No results found.   Medications: Scheduled Meds:  amLODipine   10 mg Oral Daily   cloNIDine   0.1 mg Oral BID   dextromethorphan -guaiFENesin   1 tablet Oral BID   DULoxetine   30 mg Oral Daily   enoxaparin  (LOVENOX ) injection  40 mg Subcutaneous Q24H   ketorolac   15 mg Intravenous Q6H   loratadine   10 mg Oral Daily   montelukast   10 mg Oral QHS   nicotine   14 mg Transdermal Daily   pantoprazole   40 mg Oral Daily  senna-docusate  1 tablet Oral BID   Continuous Infusions:   PRN Meds:.acetaminophen , albuterol , guaiFENesin , HYDROmorphone  (DILAUDID ) injection, hydrOXYzine , lip balm, ondansetron  **OR** ondansetron  (ZOFRAN ) IV, mouth rinse, oxyCODONE , polyethylene glycol  Consultants: None  Procedures: None  Antibiotics: None  Assessment/Plan: Principal Problem:   Sickle cell pain crisis (HCC) Active Problems:   Essential hypertension   Cocaine  abuse (HCC)   Acute cough   Mild intermittent asthma without complication  Hb Sickle Cell Disease with Pain crisis: IV fluid at KVO, continue Dilaudid  1 mg every 3 hours as needed for pain.  Continue IV Toradol  15 mg Q 6 H for a total of 5 days, continue oral home pain  medications as ordered. Monitor vitals very closely, Re-evaluate pain scale regularly, 2 L of Oxygen by [place]. Patient encouraged to ambulate on the hallway today.  Leukocytosis: Clinically stable.  Will continue to monitor without antibiotics since there is no sign or symptom of infection at this time. Anemia of Chronic Disease: Hemoglobin is within patients baseline, there is no clinical indication for blood transfusion at this time.  Will continue to monitor closely and transfuse as appropriate. Chronic pain Syndrome: Continue oral home pain medications as ordered. Essential hypertension: Blood pressure is controlled.  Continue to monitor Cough: Continue cough expectorant, Mucinex  DM.   Code Status: Full Code Family Communication: N/A Disposition Plan: Ready for discharge in the a.m. Dajanique Robley NP  If 7PM-7AM, please contact night-coverage.  10/06/2023, 3:40 PM  LOS: 4 days

## 2023-10-07 DIAGNOSIS — D5700 Hb-SS disease with crisis, unspecified: Secondary | ICD-10-CM | POA: Diagnosis not present

## 2023-10-07 LAB — COMPREHENSIVE METABOLIC PANEL WITH GFR
ALT: 27 U/L (ref 0–44)
AST: 32 U/L (ref 15–41)
Albumin: 3.8 g/dL (ref 3.5–5.0)
Alkaline Phosphatase: 88 U/L (ref 38–126)
Anion gap: 10 (ref 5–15)
BUN: 13 mg/dL (ref 6–20)
CO2: 22 mmol/L (ref 22–32)
Calcium: 10.4 mg/dL — ABNORMAL HIGH (ref 8.9–10.3)
Chloride: 105 mmol/L (ref 98–111)
Creatinine, Ser: 0.97 mg/dL (ref 0.61–1.24)
GFR, Estimated: >60 mL/min (ref >60)
Glucose, Bld: 77 mg/dL (ref 70–99)
Potassium: 5.2 mmol/L — ABNORMAL HIGH (ref 3.5–5.1)
Sodium: 137 mmol/L (ref 135–145)
Total Bilirubin: 1.3 mg/dL — ABNORMAL HIGH (ref 0.0–1.2)
Total Protein: 7.4 g/dL (ref 6.5–8.1)

## 2023-10-07 MED ORDER — OXYCODONE HCL 10 MG PO TABS: 10.0000 mg | ORAL_TABLET | Freq: Four times a day (QID) | ORAL | 0 refills | Status: DC | PRN

## 2023-10-07 MED ORDER — DM-GUAIFENESIN ER 30-600 MG PO TB12: 1.0000 | ORAL_TABLET | Freq: Two times a day (BID) | ORAL | 0 refills | Status: AC

## 2023-10-07 NOTE — Discharge Summary (Signed)
 Physician Discharge Summary  Tad Fancher Rackham FMW:[id number] DOB: [birth year] DOA: 10/02/2023  PCP: Oley Bascom RAMAN, NP  Admit date: 10/02/2023  Discharge date: 10/07/2023  Discharge Diagnoses:  Principal Problem:   Sickle cell pain crisis (HCC) Active Problems:   Essential hypertension   Cocaine  abuse (HCC)   Acute cough   Mild intermittent asthma without complication   Discharge Condition: Stable  Disposition:   Follow-up Information     Oley Bascom RAMAN, NP. Call in 1 week(s).   Specialties: Pulmonary Disease, Endocrinology Contact information: [address] [place] [address] [phone]                Pt is discharged home in good condition and is to follow up with Oley Bascom RAMAN, NP this week to have labs evaluated. Nancyann SAUNDERS Giesler is instructed to increase activity slowly and balance with rest for the next few days, and use prescribed medication to complete treatment of pain  Diet: Regular Wt Readings from Last 3 Encounters:  10/02/23 90.7 kg  09/20/23 90.7 kg  04/02/23 88.6 kg    History of present illness:  Dean Webb is a 48 y.o. male with medical history significant for sickle cell disease, chronic pain syndrome, anemia of chronic disease, mild intermittent asthma, HTN, GERD, cocaine  abuse, tobacco use who presented to the ED for evaluation of chest and back pain similar to sickle cell pain crisis episodes.   Patient reports developing URI symptoms about 2 days ago with cough productive of green sputum as well as green rhinorrhea.  He has been feeling fatigued.  He is concerned that this has flared up his sickle cell pain.  He has been experiencing primarily chest and back pain as well as some pain in all of his extremities similar to his prior sickle cell flareups.  He has not had chest pain or dyspnea.   Patient was recently admitted for sickle cell pain crisis 8/7-8/14.  It was noted that UDS was positive for cocaine  therefore PCA  pump was avoided and he was managed on IV Dilaudid  1 mg every 3 hours as needed in addition to IV fluids and Toradol .   ED Course  Labs/Imaging on admission: I have personally reviewed following labs and imaging studies.   Initial vitals showed BP 138/91, pulse 92, RR 20, temp 98.9 F, SpO2 99% on room air.   Labs showed WBC 11.8, hemoglobin 10.7, platelets 345, sodium 135, potassium 3.7, bicarb 23, BUN 11, creatinine 1.06, serum glucose 110, troponin 9 x 2.   SARS-CoV-2, influenza, RSV PCR negative.   2 view chest x-ray negative for focal consolidation, edema, effusion.   Patient was given IV Dilaudid  2 mg x 3, IV Toradol  15 mg.  The hospitalist service was consulted for admission.  Hospital Course:  Patient was admitted for sickle cell pain crisis and managed appropriately with IVF, IV Dilaudid  and IV Toradol , as well as other adjunct therapies per sickle cell pain management protocols.  Patient tested positive again this time for cocaine  and for this reason he was not on IV Dilaudid  PCA but was given 1 mg every 3 hours as needed for pain.  His URI was treated symptomatically since chest x-ray was normal.  Patient slowly improved in his symptoms.  Pains gradually returned to baseline.  Hemoglobin remained stable throughout this admission, patient did not require blood transfusion.  As part today, patient is tolerating p.o. intake with no restrictions and ambulating well with no significant pain.  Patient is requesting to be discharged, now requesting oxycodone  as needed for pain at home.  PDMP was reviewed, last prescription filled was in February 2025.  Patient is cleared to show up for his primary care appointment at the patient care center.  Patient was given prescription for 60 tablets of short acting oxycodone  until he is able to visit his primary care doctor.  Patient was therefore discharged home today in a hemodynamically stable condition.   Zakery will follow-up with PCP within 1 week  of this discharge. Zymiere was counseled extensively about nonpharmacologic means of pain management, patient verbalized understanding and was appreciative of  the care received during this admission.   We discussed the need for good hydration, monitoring of hydration status, avoidance of heat, cold, stress, and infection triggers. We discussed the need to be adherent with taking Hydrea and other home medications. Patient was reminded of the need to seek medical attention immediately if any symptom of bleeding, anemia, or infection occurs.  Discharge Exam: Vitals:   10/07/23 1114 10/07/23 1344  BP: (!) 139/92 (!) 152/95  Pulse: 65 74  Resp: 15 14  Temp: 97.9 F (36.6 C) 98.1 F (36.7 C)  SpO2: 100% 99%   Vitals:   10/06/23 2027 10/07/23 0423 10/07/23 1114 10/07/23 1344  BP: (!) 140/90 (!) 121/90 (!) 139/92 (!) 152/95  Pulse: (!) 58 62 65 74  Resp: 18 18 15 14   Temp: 98.4 F (36.9 C) 98 F (36.7 C) 97.9 F (36.6 C) 98.1 F (36.7 C)  TempSrc: Oral Oral Oral Oral  SpO2: 100% 100% 100% 99%  Weight:      Height:        General appearance : Awake, alert, not in any distress. Speech Clear. Not toxic looking HEENT: Atraumatic and Normocephalic, pupils equally reactive to light and accomodation Neck: Supple, no JVD. No cervical lymphadenopathy.  Chest: Good air entry bilaterally, no added sounds  CVS: S1 S2 regular, no murmurs.  Abdomen: Bowel sounds present, Non tender and not distended with no gaurding, rigidity or rebound. Extremities: B/L Lower Ext shows no edema, both legs are warm to touch Neurology: Awake alert, and oriented X 3, CN II-XII intact, Non focal Skin: No Rash  Discharge Instructions  Discharge Instructions     Diet - low sodium heart healthy   Complete by: As directed    Increase activity slowly   Complete by: As directed       Allergies as of 10/07/2023   No Known Allergies      Medication List     TAKE these medications    albuterol  108 (90  Base) MCG/ACT inhaler Commonly known as: VENTOLIN  HFA TAKE 2 PUFFS BY MOUTH EVERY 6 HOURS AS NEEDED FOR WHEEZE OR SHORTNESS OF BREATH   amLODipine  10 MG tablet Commonly known as: NORVASC  Take 1 tablet (10 mg total) by mouth daily.   cetirizine  10 MG tablet Commonly known as: ZYRTEC  TAKE 1 TABLET BY MOUTH EVERY DAY AS NEEDED FOR ALLERGY   cloNIDine  0.1 MG tablet Commonly known as: CATAPRES  TAKE 1 TABLET BY MOUTH 2 TIMES DAILY.   dextromethorphan -guaiFENesin  30-600 MG 12hr tablet Commonly known as: MUCINEX  DM Take 1 tablet by mouth 2 (two) times daily.   DULoxetine  30 MG capsule Commonly known as: Cymbalta  Take 1 capsule (30 mg total) by mouth daily.   gabapentin  300 MG capsule Commonly known as: NEURONTIN  Take 1 capsule (300 mg total) by mouth 2 (two) times daily. What changed:  when to  take this reasons to take this   losartan -hydrochlorothiazide  100-25 MG tablet Commonly known as: HYZAAR Take 1 tablet by mouth daily.   metoprolol  succinate 50 MG 24 hr tablet Commonly known as: TOPROL -XL Take 1 tablet (50 mg total) by mouth daily. Take with or immediately following a meal.   montelukast  10 MG tablet Commonly known as: SINGULAIR  TAKE 1 TABLET BY MOUTH EVERYDAY AT BEDTIME   nicotine  21 mg/24hr patch Commonly known as: NICODERM CQ  - dosed in mg/24 hours Place 1 patch (21 mg total) onto the skin daily.   Oxycodone  HCl 10 MG Tabs Take 1 tablet (10 mg total) by mouth every 6 (six) hours as needed for moderate pain (pain score 4-6).   pantoprazole  40 MG tablet Commonly known as: PROTONIX  Take 1 tablet (40 mg total) by mouth daily.   tiZANidine  4 MG tablet Commonly known as: Zanaflex  Take 1 tablet (4 mg total) by mouth every 6 (six) hours as needed for muscle spasms.        The results of significant diagnostics from this hospitalization (including imaging, microbiology, ancillary and laboratory) are listed below for reference.    Significant Diagnostic  Studies: DG Chest 2 View Result Date: 10/02/2023 CLINICAL DATA:  Chest pain EXAM: CHEST - 2 VIEW COMPARISON:  04/01/2023 FINDINGS: Heart and mediastinal contours are within normal limits. No focal opacities or effusions. No acute bony abnormality. IMPRESSION: No active cardiopulmonary disease. Electronically Signed   By: Franky Crease M.D.   On: 10/02/2023 19:31    Microbiology: Recent Results (from the past 240 hours)  Resp panel by RT-PCR (RSV, Flu A&B, Covid) Anterior Nasal Swab     Status: None   Collection Time: 10/02/23  7:42 PM   Specimen: Anterior Nasal Swab  Result Value Ref Range Status   SARS Coronavirus 2 by RT PCR NEGATIVE NEGATIVE Final    Comment: (NOTE) SARS-CoV-2 target nucleic acids are NOT DETECTED.  The SARS-CoV-2 RNA is generally detectable in upper respiratory specimens during the acute phase of infection. The lowest concentration of SARS-CoV-2 viral copies this assay can detect is 138 copies/mL. A negative result does not preclude SARS-Cov-2 infection and should not be used as the sole basis for treatment or other patient management decisions. A negative result may occur with  improper specimen collection/handling, submission of specimen other than nasopharyngeal swab, presence of viral mutation(s) within the areas targeted by this assay, and inadequate number of viral copies(<138 copies/mL). A negative result must be combined with clinical observations, patient history, and epidemiological information. The expected result is Negative.  Fact Sheet for Patients:  BloggerCourse.com  Fact Sheet for Healthcare Providers:  SeriousBroker.it  This test is no t yet approved or cleared by the [place]  FDA and  has been authorized for detection and/or diagnosis of SARS-CoV-2 by FDA under an Emergency Use Authorization (EUA). This EUA will remain  in effect (meaning this test can be used) for the duration of  the COVID-19 declaration under Section 564(b)(1) of the Act, 21 U.S.C.section 360bbb-3(b)(1), unless the authorization is terminated  or revoked sooner.       Influenza A by PCR NEGATIVE NEGATIVE Final   Influenza B by PCR NEGATIVE NEGATIVE Final    Comment: (NOTE) The Xpert Xpress SARS-CoV-2/FLU/RSV plus assay is intended as an aid in the diagnosis of influenza from Nasopharyngeal swab specimens and should not be used as a sole basis for treatment. Nasal washings and aspirates are unacceptable for Xpert Xpress SARS-CoV-2/FLU/RSV testing.  Fact Sheet for  Patients: BloggerCourse.com  Fact Sheet for Healthcare Providers: SeriousBroker.it  This test is not yet approved or cleared by the [place]  FDA and has been authorized for detection and/or diagnosis of SARS-CoV-2 by FDA under an Emergency Use Authorization (EUA). This EUA will remain in effect (meaning this test can be used) for the duration of the COVID-19 declaration under Section 564(b)(1) of the Act, 21 U.S.C. section 360bbb-3(b)(1), unless the authorization is terminated or revoked.     Resp Syncytial Virus by PCR NEGATIVE NEGATIVE Final    Comment: (NOTE) Fact Sheet for Patients: BloggerCourse.com  Fact Sheet for Healthcare Providers: SeriousBroker.it  This test is not yet approved or cleared by the [place]  FDA and has been authorized for detection and/or diagnosis of SARS-CoV-2 by FDA under an Emergency Use Authorization (EUA). This EUA will remain in effect (meaning this test can be used) for the duration of the COVID-19 declaration under Section 564(b)(1) of the Act, 21 U.S.C. section 360bbb-3(b)(1), unless the authorization is terminated or revoked.  Performed at [facility], [address], [place], [place] [address]      Labs: Basic Metabolic Panel: Recent Labs  Lab  10/02/23 1931 10/03/23 0543 10/07/23 0639  NA 135 134* 137  K 3.7 4.0 5.2*  CL 103 103 105  CO2 23 25 22   GLUCOSE 110* 125* 77  BUN 11 12 13   CREATININE 1.06 0.81 0.97  CALCIUM 10.5* 10.3 10.4*   Liver Function Tests: Recent Labs  Lab 10/02/23 1931 10/07/23 0639  AST 22 32  ALT 22 27  ALKPHOS 89 88  BILITOT 1.4* 1.3*  PROT 7.2 7.4  ALBUMIN 4.0 3.8   No results for input(s): LIPASE, AMYLASE in the last 168 hours. No results for input(s): AMMONIA in the last 168 hours. CBC: Recent Labs  Lab 10/02/23 1931 10/03/23 0543 10/04/23 0621 10/05/23 0625 10/06/23 0809  WBC 11.8* 14.1* 10.6* 17.8* 10.6*  NEUTROABS 7.0  --   --   --   --   HGB 10.7* 10.5* 10.8* 10.4* 10.5*  HCT 28.7* 28.7* 29.5* 28.7* 28.3*  MCV 79.5* 80.2 79.9* 80.2 79.1*  PLT 345 321 342 341 380   Cardiac Enzymes: No results for input(s): CKTOTAL, CKMB, CKMBINDEX, TROPONINI in the last 168 hours. BNP: Invalid input(s): POCBNP CBG: No results for input(s): GLUCAP in the last 168 hours.  Time coordinating discharge: 50 minutes  Signed:  Terica Yogi  [facility] 10/07/2023, 2:30 PM

## 2023-10-07 NOTE — Plan of Care (Signed)
  Problem: Education: Goal: Knowledge of vaso-occlusive preventative measures will improve Outcome: Completed/Met Goal: Awareness of infection prevention will improve Outcome: Completed/Met Goal: Awareness of signs and symptoms of anemia will improve Outcome: Completed/Met Goal: Long-term complications will improve Outcome: Completed/Met   Problem: Self-Care: Goal: Ability to incorporate actions that prevent/reduce pain crisis will improve Outcome: Completed/Met   Problem: Bowel/Gastric: Goal: Gut motility will be maintained Outcome: Completed/Met   Problem: Tissue Perfusion: Goal: Complications related to inadequate tissue perfusion will be avoided or minimized Outcome: Completed/Met   Problem: Respiratory: Goal: Pulmonary complications will be avoided or minimized Outcome: Completed/Met Goal: Acute Chest Syndrome will be identified early to prevent complications Outcome: Completed/Met   Problem: Fluid Volume: Goal: Ability to maintain a balanced intake and output will improve Outcome: Completed/Met   Problem: Sensory: Goal: Pain level will decrease with appropriate interventions Outcome: Completed/Met   Problem: Health Behavior: Goal: Postive changes in compliance with treatment and prescription regimens will improve Outcome: Completed/Met   Problem: Education: Goal: Knowledge of General Education information will improve Description: Including pain rating scale, medication(s)/side effects and non-pharmacologic comfort measures Outcome: Completed/Met   Problem: Health Behavior/Discharge Planning: Goal: Ability to manage health-related needs will improve Outcome: Completed/Met   Problem: Clinical Measurements: Goal: Ability to maintain clinical measurements within normal limits will improve Outcome: Completed/Met Goal: Will remain free from infection Outcome: Completed/Met Goal: Diagnostic test results will improve Outcome: Completed/Met Goal: Respiratory  complications will improve Outcome: Completed/Met Goal: Cardiovascular complication will be avoided Outcome: Completed/Met   Problem: Activity: Goal: Risk for activity intolerance will decrease Outcome: Completed/Met   Problem: Nutrition: Goal: Adequate nutrition will be maintained Outcome: Completed/Met   Problem: Coping: Goal: Level of anxiety will decrease Outcome: Completed/Met   Problem: Elimination: Goal: Will not experience complications related to bowel motility Outcome: Completed/Met Goal: Will not experience complications related to urinary retention Outcome: Completed/Met   Problem: Pain Managment: Goal: General experience of comfort will improve and/or be controlled Outcome: Completed/Met   Problem: Safety: Goal: Ability to remain free from injury will improve Outcome: Completed/Met   Problem: Skin Integrity: Goal: Risk for impaired skin integrity will decrease Outcome: Completed/Met

## 2023-10-08 ENCOUNTER — Inpatient Hospital Stay (HOSPITAL_COMMUNITY)
Admission: EM | Admit: 2023-10-08 | Discharge: 2023-10-12 | DRG: 811 | Disposition: A | Discharge status: Home / Self Care | Payer: MEDICAID | Attending: Internal Medicine | Admitting: Internal Medicine

## 2023-10-08 ENCOUNTER — Other Ambulatory Visit: Payer: Self-pay

## 2023-10-08 ENCOUNTER — Emergency Department (HOSPITAL_COMMUNITY): Payer: MEDICAID

## 2023-10-08 ENCOUNTER — Encounter (HOSPITAL_COMMUNITY): Payer: Self-pay

## 2023-10-08 DIAGNOSIS — Z8249 Family history of ischemic heart disease and other diseases of the circulatory system: Secondary | ICD-10-CM

## 2023-10-08 DIAGNOSIS — J4520 Mild intermittent asthma, uncomplicated: Secondary | ICD-10-CM | POA: Diagnosis present

## 2023-10-08 DIAGNOSIS — D5700 Hb-SS disease with crisis, unspecified: Principal | ICD-10-CM | POA: Diagnosis present

## 2023-10-08 DIAGNOSIS — R109 Unspecified abdominal pain: Secondary | ICD-10-CM

## 2023-10-08 DIAGNOSIS — G894 Chronic pain syndrome: Secondary | ICD-10-CM | POA: Diagnosis present

## 2023-10-08 DIAGNOSIS — K5900 Constipation, unspecified: Secondary | ICD-10-CM | POA: Diagnosis present

## 2023-10-08 DIAGNOSIS — Z1152 Encounter for screening for COVID-19: Secondary | ICD-10-CM

## 2023-10-08 DIAGNOSIS — J189 Pneumonia, unspecified organism: Principal | ICD-10-CM | POA: Diagnosis present

## 2023-10-08 DIAGNOSIS — D638 Anemia in other chronic diseases classified elsewhere: Secondary | ICD-10-CM | POA: Diagnosis present

## 2023-10-08 DIAGNOSIS — Y95 Nosocomial condition: Secondary | ICD-10-CM | POA: Diagnosis present

## 2023-10-08 DIAGNOSIS — Z833 Family history of diabetes mellitus: Secondary | ICD-10-CM

## 2023-10-08 DIAGNOSIS — Z825 Family history of asthma and other chronic lower respiratory diseases: Secondary | ICD-10-CM

## 2023-10-08 DIAGNOSIS — F17210 Nicotine dependence, cigarettes, uncomplicated: Secondary | ICD-10-CM | POA: Diagnosis present

## 2023-10-08 DIAGNOSIS — D735 Infarction of spleen: Secondary | ICD-10-CM | POA: Diagnosis present

## 2023-10-08 DIAGNOSIS — Z832 Family history of diseases of the blood and blood-forming organs and certain disorders involving the immune mechanism: Secondary | ICD-10-CM

## 2023-10-08 DIAGNOSIS — I10 Essential (primary) hypertension: Secondary | ICD-10-CM | POA: Diagnosis present

## 2023-10-08 LAB — CBC
HCT: 30 % — ABNORMAL LOW (ref 39.0–52.0)
HCT: 28.2 % — ABNORMAL LOW (ref 39.0–52.0)
Hemoglobin: 11.2 g/dL — ABNORMAL LOW (ref 13.0–17.0)
Hemoglobin: 10.2 g/dL — ABNORMAL LOW (ref 13.0–17.0)
MCH: 29.2 pg (ref 26.0–34.0)
MCH: 28.3 pg (ref 26.0–34.0)
MCHC: 37.3 g/dL — ABNORMAL HIGH (ref 30.0–36.0)
MCHC: 36.2 g/dL — ABNORMAL HIGH (ref 30.0–36.0)
MCV: 78.1 fL — ABNORMAL LOW (ref 80.0–100.0)
MCV: 78.3 fL — ABNORMAL LOW (ref 80.0–100.0)
Platelets: 426 K/uL — ABNORMAL HIGH (ref 150–400)
Platelets: 417 K/uL — ABNORMAL HIGH (ref 150–400)
RBC: 3.84 MIL/uL — ABNORMAL LOW (ref 4.22–5.81)
RBC: 3.6 MIL/uL — ABNORMAL LOW (ref 4.22–5.81)
RDW: 15.1 % (ref 11.5–15.5)
RDW: 14.8 % (ref 11.5–15.5)
WBC: 14.1 K/uL — ABNORMAL HIGH (ref 4.0–10.5)
WBC: 14.9 K/uL — ABNORMAL HIGH (ref 4.0–10.5)
nRBC: 1.1 % — ABNORMAL HIGH (ref 0.0–0.2)
nRBC: 0.9 % — ABNORMAL HIGH (ref 0.0–0.2)

## 2023-10-08 LAB — BASIC METABOLIC PANEL WITH GFR
Anion gap: 10 (ref 5–15)
BUN: 9 mg/dL (ref 6–20)
CO2: 22 mmol/L (ref 22–32)
Calcium: 10.4 mg/dL — ABNORMAL HIGH (ref 8.9–10.3)
Chloride: 103 mmol/L (ref 98–111)
Creatinine, Ser: 1.03 mg/dL (ref 0.61–1.24)
GFR, Estimated: >60 mL/min (ref >60)
Glucose, Bld: 105 mg/dL — ABNORMAL HIGH (ref 70–99)
Potassium: 4.1 mmol/L (ref 3.5–5.1)
Sodium: 135 mmol/L (ref 135–145)

## 2023-10-08 LAB — RESP PANEL BY RT-PCR (RSV, FLU A&B, COVID)  RVPGX2
Influenza A by PCR: NEGATIVE
Influenza B by PCR: NEGATIVE
Resp Syncytial Virus by PCR: NEGATIVE
SARS Coronavirus 2 by RT PCR: NEGATIVE

## 2023-10-08 LAB — MRSA NEXT GEN BY PCR, NASAL: MRSA by PCR Next Gen: NOT DETECTED

## 2023-10-08 LAB — LIPASE, BLOOD: Lipase: 26 U/L (ref 11–51)

## 2023-10-08 LAB — CREATININE, SERUM
Creatinine, Ser: 1.02 mg/dL (ref 0.61–1.24)
GFR, Estimated: >60 mL/min (ref >60)

## 2023-10-08 MED ORDER — OXYCODONE HCL 5 MG PO TABS
10.0000 mg | ORAL_TABLET | Freq: Four times a day (QID) | ORAL | Status: DC | PRN
  Administered 2023-10-08 – 2023-10-12 (×9): 10 mg via ORAL
  Filled 2023-10-08 (×9): qty 2

## 2023-10-08 MED ORDER — ALBUTEROL SULFATE (2.5 MG/3ML) 0.083% IN NEBU
2.5000 mg | INHALATION_SOLUTION | RESPIRATORY_TRACT | Status: DC | PRN
  Administered 2023-10-10 – 2023-10-12 (×3): 2.5 mg via RESPIRATORY_TRACT
  Filled 2023-10-08 (×3): qty 3

## 2023-10-08 MED ORDER — PNEUMOCOCCAL 20-VAL CONJ VACC 0.5 ML IM SUSY
0.5000 mL | PREFILLED_SYRINGE | INTRAMUSCULAR | Status: AC
  Administered 2023-10-10: 0.5 mL via INTRAMUSCULAR
  Filled 2023-10-08: qty 0.5

## 2023-10-08 MED ORDER — POLYETHYLENE GLYCOL 3350 17 G PO PACK
17.0000 g | PACK | Freq: Two times a day (BID) | ORAL | Status: DC
  Administered 2023-10-08 – 2023-10-12 (×8): 17 g via ORAL
  Filled 2023-10-08 (×8): qty 1

## 2023-10-08 MED ORDER — SODIUM CHLORIDE 0.9 % IV SOLN
2.0000 g | Freq: Once | INTRAVENOUS | Status: AC
  Administered 2023-10-08: 2 g via INTRAVENOUS
  Filled 2023-10-08: qty 12.5

## 2023-10-08 MED ORDER — DULOXETINE HCL 30 MG PO CPEP
30.0000 mg | ORAL_CAPSULE | Freq: Every day | ORAL | Status: DC
  Administered 2023-10-08 – 2023-10-12 (×5): 30 mg via ORAL
  Filled 2023-10-08 (×5): qty 1

## 2023-10-08 MED ORDER — METOPROLOL SUCCINATE ER 50 MG PO TB24
50.0000 mg | ORAL_TABLET | Freq: Every day | ORAL | Status: DC
  Administered 2023-10-09 – 2023-10-12 (×4): 50 mg via ORAL
  Filled 2023-10-08 (×4): qty 1

## 2023-10-08 MED ORDER — GABAPENTIN 300 MG PO CAPS
300.0000 mg | ORAL_CAPSULE | Freq: Two times a day (BID) | ORAL | Status: DC
  Administered 2023-10-08 – 2023-10-12 (×8): 300 mg via ORAL
  Filled 2023-10-08 (×8): qty 1

## 2023-10-08 MED ORDER — IOHEXOL 300 MG/ML  SOLN
100.0000 mL | Freq: Once | INTRAMUSCULAR | Status: AC | PRN
  Administered 2023-10-08: 100 mL via INTRAVENOUS

## 2023-10-08 MED ORDER — PANTOPRAZOLE SODIUM 40 MG PO TBEC
40.0000 mg | DELAYED_RELEASE_TABLET | Freq: Every day | ORAL | Status: DC
  Administered 2023-10-08 – 2023-10-12 (×5): 40 mg via ORAL
  Filled 2023-10-08 (×5): qty 1

## 2023-10-08 MED ORDER — VANCOMYCIN HCL IN DEXTROSE 1-5 GM/200ML-% IV SOLN
1000.0000 mg | Freq: Once | INTRAVENOUS | Status: AC
  Administered 2023-10-08: 1000 mg via INTRAVENOUS
  Filled 2023-10-08: qty 200

## 2023-10-08 MED ORDER — ONDANSETRON HCL 4 MG PO TABS: 4.0000 mg | ORAL_TABLET | Freq: Four times a day (QID) | ORAL | Status: DC | PRN

## 2023-10-08 MED ORDER — DM-GUAIFENESIN ER 30-600 MG PO TB12: 1.0000 | ORAL_TABLET | Freq: Two times a day (BID) | ORAL | Status: DC

## 2023-10-08 MED ORDER — MONTELUKAST SODIUM 10 MG PO TABS
10.0000 mg | ORAL_TABLET | Freq: Every day | ORAL | Status: DC
  Administered 2023-10-08 – 2023-10-11 (×4): 10 mg via ORAL
  Filled 2023-10-08 (×5): qty 1

## 2023-10-08 MED ORDER — HYDROMORPHONE HCL 1 MG/ML IJ SOLN
1.0000 mg | Freq: Once | INTRAMUSCULAR | Status: AC
  Administered 2023-10-08: 1 mg via INTRAVENOUS
  Filled 2023-10-08: qty 1

## 2023-10-08 MED ORDER — ENOXAPARIN SODIUM 40 MG/0.4ML IJ SOSY
40.0000 mg | PREFILLED_SYRINGE | INTRAMUSCULAR | Status: DC
  Administered 2023-10-08 – 2023-10-11 (×4): 40 mg via SUBCUTANEOUS
  Filled 2023-10-08 (×4): qty 0.4

## 2023-10-08 MED ORDER — ONDANSETRON HCL 4 MG/2ML IJ SOLN: 4.0000 mg | Freq: Four times a day (QID) | INTRAMUSCULAR | Status: DC | PRN

## 2023-10-08 MED ORDER — LACTULOSE 10 GM/15ML PO SOLN
20.0000 g | Freq: Once | ORAL | Status: AC
  Administered 2023-10-08: 20 g via ORAL
  Filled 2023-10-08: qty 30

## 2023-10-08 MED ORDER — SODIUM CHLORIDE 0.9 % IV SOLN
2.0000 g | Freq: Three times a day (TID) | INTRAVENOUS | Status: DC
  Administered 2023-10-09: 2 g via INTRAVENOUS
  Filled 2023-10-08: qty 12.5

## 2023-10-08 MED ORDER — BISACODYL 10 MG RE SUPP
10.0000 mg | Freq: Once | RECTAL | Status: AC
  Administered 2023-10-08: 10 mg via RECTAL
  Filled 2023-10-08: qty 1

## 2023-10-08 MED ORDER — SODIUM CHLORIDE 0.9 % IV SOLN: INTRAVENOUS | Status: DC

## 2023-10-08 MED ORDER — SMOG ENEMA
400.0000 mL | Freq: Once | RECTAL | Status: DC
  Filled 2023-10-08: qty 960

## 2023-10-08 MED ORDER — SENNOSIDES-DOCUSATE SODIUM 8.6-50 MG PO TABS
3.0000 | ORAL_TABLET | Freq: Every day | ORAL | Status: DC
  Administered 2023-10-08 – 2023-10-11 (×4): 3 via ORAL
  Filled 2023-10-08 (×4): qty 3

## 2023-10-08 NOTE — H&P (Signed)
 History and Physical    Dean Webb FMW:[id number] DOB: [birth year] DOA: 10/08/2023  PCP: Oley Bascom RAMAN, NP Patient coming from: home  Chief Complaint: constipation and cough  HPI: Dean Webb is a 48 y.o. male with medical history significant of sickle cell disease, chronic pain syndrome, hypertension, cocaine  abuse, tobacco abuse, GERD, hypertension, asthma mild, anemia, chronic pain syndrome was admitted to the hospital recently on 10/02/2023 and discharged on the 24th after sickle cell pain crisis. He comes in today with complaints of not being able to have a bowel movement for the last 6 days.  He complains of abdominal pain and nausea.  No vomiting.  P.o. intake has been poor due to abdominal pain nausea and constipation. He has been coughing up greenish phlegm for past few days with some shortness of breath however he remains on room air in the ER and saturating well on room air.  Denies chest pain.  No urinary complaints.  Lives at home alone.  He is being admitted for constipation and HCAP. CT of the abdomen and pelvis shows no evidence of bowel obstruction,The ascending, transverse, and descending colon are collapsed, limiting detailed evaluation, with wall thickening and trace adjacent fluid at the right hepatic flexure, which may be secondary to a nonspecific colitis. Progressive auto infarction of the spleen.Patchy areas of ground-glass tree-in-bud nodularity in the right lower lobe may be secondary to an infectious/inflammatory process COVID RSV flu respiratory virus panel pending MRSA PCR pending  ED Course: Received cefepime  in the ED  Review of Systems: As per HPI otherwise all other systems reviewed and are negative    Past Medical History:  Diagnosis Date   Asthma    GERD (gastroesophageal reflux disease)    Hb-S/Hb-C disease (HCC) 1979   HTN (hypertension) 2008   Hypertension    Sickle cell anemia (HCC)    Sickle cell disease (HCC)     Past Surgical  History:  Procedure Laterality Date   CHOLECYSTECTOMY      Social History   Socioeconomic History   Marital status: Significant Other    Spouse name: Not on file   Number of children: Not on file   Years of education: Not on file   Highest education level: Not on file  Occupational History   Not on file  Tobacco Use   Smoking status: Every Day    Current packs/day: 0.50    Average packs/day: 0.5 packs/day for 17.0 years (8.5 ttl pk-yrs)    Types: Cigarettes   Smokeless tobacco: Never  Vaping Use   Vaping status: Never Used  Substance and Sexual Activity   Alcohol use: Yes    Comment: Occasional drinker   Drug use: Not Currently    Types: Marijuana   Sexual activity: Yes  Other Topics Concern   Not on file  Social History Narrative   Not on file   Social Drivers of Health   Financial Resource Strain: Not on file  Food Insecurity: No Food Insecurity (10/03/2023)   Hunger Vital Sign    Worried About Running Out of Food in the Last Year: Never true    Ran Out of Food in the Last Year: Never true  Transportation Needs: No Transportation Needs (10/03/2023)   PRAPARE - Administrator, Civil Service (Medical): No    Lack of Transportation (Non-Medical): No  Physical Activity: Not on file  Stress: Not on file  Social Connections: Not on file  Intimate Partner Violence: Not At Risk (  10/03/2023)   Humiliation, Afraid, Rape, and Kick questionnaire    Fear of Current or Ex-Partner: No    Emotionally Abused: No    Physically Abused: No    Sexually Abused: No    No Known Allergies  Family History  Problem Relation Age of Onset   Sickle cell trait Mother    Hypertension Mother    Asthma Mother    Sickle cell trait Father    Congestive Heart Failure Father    Hypertension Sister    Sickle cell anemia Daughter    Hypertension Daughter    Diabetes Daughter       Prior to Admission medications   Medication Sig Start Date End Date Taking? Authorizing  Provider  albuterol  (VENTOLIN  HFA) 108 (90 Base) MCG/ACT inhaler TAKE 2 PUFFS BY MOUTH EVERY 6 HOURS AS NEEDED FOR WHEEZE OR SHORTNESS OF BREATH 09/05/22   Oley Bascom RAMAN, NP  amLODipine  (NORVASC ) 10 MG tablet Take 1 tablet (10 mg total) by mouth daily. 04/17/22   Oley Bascom RAMAN, NP  cetirizine  (ZYRTEC ) 10 MG tablet TAKE 1 TABLET BY MOUTH EVERY DAY AS NEEDED FOR ALLERGY 04/17/22   Nichols, Tonya S, NP  cloNIDine  (CATAPRES ) 0.1 MG tablet TAKE 1 TABLET BY MOUTH 2 TIMES DAILY. 07/17/22   Oley Bascom RAMAN, NP  dextromethorphan -guaiFENesin  (MUCINEX  DM) 30-600 MG 12hr tablet Take 1 tablet by mouth 2 (two) times daily. 10/07/23   Jegede, Olugbemiga E, MD  DULoxetine  (CYMBALTA ) 30 MG capsule Take 1 capsule (30 mg total) by mouth daily. 08/25/22   Pokhrel, Laxman, MD  gabapentin  (NEURONTIN ) 300 MG capsule Take 1 capsule (300 mg total) by mouth 2 (two) times daily. Patient taking differently: Take 300 mg by mouth 2 (two) times daily as needed. 07/17/22   Oley Bascom RAMAN, NP  losartan -hydrochlorothiazide  (HYZAAR) 100-25 MG tablet Take 1 tablet by mouth daily. 08/27/22   Pokhrel, Laxman, MD  metoprolol  succinate (TOPROL -XL) 50 MG 24 hr tablet Take 1 tablet (50 mg total) by mouth daily. Take with or immediately following a meal. 04/17/22   Oley Bascom RAMAN, NP  montelukast  (SINGULAIR ) 10 MG tablet TAKE 1 TABLET BY MOUTH EVERYDAY AT BEDTIME 04/17/22   Nichols, Tonya S, NP  nicotine  (NICODERM CQ  - DOSED IN MG/24 HOURS) 21 mg/24hr patch Place 1 patch (21 mg total) onto the skin daily. 05/06/23   Marry Clamp, MD  oxyCODONE  10 MG TABS Take 1 tablet (10 mg total) by mouth every 6 (six) hours as needed for moderate pain (pain score 4-6). 10/07/23   Jegede, Olugbemiga E, MD  pantoprazole  (PROTONIX ) 40 MG tablet Take 1 tablet (40 mg total) by mouth daily. 04/17/22   Nichols, Tonya S, NP  tiZANidine  (ZANAFLEX ) 4 MG tablet Take 1 tablet (4 mg total) by mouth every 6 (six) hours as needed for muscle spasms. 10/13/22   Oley Bascom RAMAN,  NP    Physical Exam: Vitals:   10/08/23 1243 10/08/23 1510 10/08/23 1735  BP: 134/89 123/86 (!) 135/93  Pulse: 70 82 88  Resp: 18 16 18   Temp: 97.9 F (36.6 C)  98.6 F (37 C)  TempSrc: Oral  Oral  SpO2: 100% 98% 99%     General:  Appears in mild distress Eyes:  PERRL, EOMI, normal lids, iris ENT:  grossly normal hearing, lips & tongue, oral mucosa dry  neck:  no LAD, masses or thyromegaly Cardiovascular:  RRR, no m/r/g. No LE edema.  Respiratory:  CTA bilaterally, no w/r/r. Normal respiratory effort.  No wheezing  Abdomen:  soft, ntnd, NABS Neurologic:  CN 2-12 grossly intact, moves all extremities in coordinated fashion, sensation intact  Labs on Admission: I have personally reviewed following labs and imaging studies  CBC: Recent Labs  Lab 10/02/23 1931 10/03/23 0543 10/04/23 0621 10/05/23 0625 10/06/23 0809 10/08/23 1437  WBC 11.8* 14.1* 10.6* 17.8* 10.6* 14.1*  NEUTROABS 7.0  --   --   --   --   --   HGB 10.7* 10.5* 10.8* 10.4* 10.5* 11.2*  HCT 28.7* 28.7* 29.5* 28.7* 28.3* 30.0*  MCV 79.5* 80.2 79.9* 80.2 79.1* 78.1*  PLT 345 321 342 341 380 426*   Basic Metabolic Panel: Recent Labs  Lab 10/02/23 1931 10/03/23 0543 10/07/23 0639 10/08/23 1437  NA 135 134* 137 135  K 3.7 4.0 5.2* 4.1  CL 103 103 105 103  CO2 23 25 22 22   GLUCOSE 110* 125* 77 105*  BUN 11 12 13 9   CREATININE 1.06 0.81 0.97 1.03  CALCIUM 10.5* 10.3 10.4* 10.4*   GFR: Estimated Creatinine Clearance: 95.2 mL/min (by C-G formula based on SCr of 1.03 mg/dL). Liver Function Tests: Recent Labs  Lab 10/02/23 1931 10/07/23 0639  AST 22 32  ALT 22 27  ALKPHOS 89 88  BILITOT 1.4* 1.3*  PROT 7.2 7.4  ALBUMIN 4.0 3.8   Recent Labs  Lab 10/08/23 1437  LIPASE 26   No results for input(s): AMMONIA in the last 168 hours. Coagulation Profile: No results for input(s): INR, PROTIME in the last 168 hours. Cardiac Enzymes: No results for input(s): CKTOTAL, CKMB, CKMBINDEX,  TROPONINI in the last 168 hours. BNP (last 3 results) No results for input(s): PROBNP in the last 8760 hours. HbA1C: No results for input(s): HGBA1C in the last 72 hours. CBG: No results for input(s): GLUCAP in the last 168 hours. Lipid Profile: No results for input(s): CHOL, HDL, LDLCALC, TRIG, CHOLHDL, LDLDIRECT in the last 72 hours. Thyroid  Function Tests: No results for input(s): TSH, T4TOTAL, FREET4, T3FREE, THYROIDAB in the last 72 hours. Anemia Panel: No results for input(s): VITAMINB12, FOLATE, FERRITIN, TIBC, IRON, RETICCTPCT in the last 72 hours. Urine analysis:    Component Value Date/Time   COLORURINE YELLOW 10/03/2023 0238   APPEARANCEUR CLEAR 10/03/2023 0238   LABSPEC 1.012 10/03/2023 0238   PHURINE 6.0 10/03/2023 0238   GLUCOSEU NEGATIVE 10/03/2023 0238   HGBUR NEGATIVE 10/03/2023 0238   BILIRUBINUR NEGATIVE 10/03/2023 0238   BILIRUBINUR negative 01/17/2021 1608   BILIRUBINUR neg 08/26/2020 1044   KETONESUR NEGATIVE 10/03/2023 0238   PROTEINUR NEGATIVE 10/03/2023 0238   UROBILINOGEN 0.2 01/17/2021 1608   UROBILINOGEN 0.2 11/01/2012 1010   NITRITE NEGATIVE 10/03/2023 0238   LEUKOCYTESUR NEGATIVE 10/03/2023 0238    Creatinine Clearance: Estimated Creatinine Clearance: 95.2 mL/min (by C-G formula based on SCr of 1.03 mg/dL).  Sepsis Labs: @LABRCNTIP (procalcitonin:4,lacticidven:4) ) Recent Results (from the past 240 hours)  Resp panel by RT-PCR (RSV, Flu A&B, Covid) Anterior Nasal Swab     Status: None   Collection Time: 10/02/23  7:42 PM   Specimen: Anterior Nasal Swab  Result Value Ref Range Status   SARS Coronavirus 2 by RT PCR NEGATIVE NEGATIVE Final    Comment: (NOTE) SARS-CoV-2 target nucleic acids are NOT DETECTED.  The SARS-CoV-2 RNA is generally detectable in upper respiratory specimens during the acute phase of infection. The lowest concentration of SARS-CoV-2 viral copies this assay can detect is 138  copies/mL. A negative result does not preclude SARS-Cov-2 infection and should not be used as  the sole basis for treatment or other patient management decisions. A negative result may occur with  improper specimen collection/handling, submission of specimen other than nasopharyngeal swab, presence of viral mutation(s) within the areas targeted by this assay, and inadequate number of viral copies(<138 copies/mL). A negative result must be combined with clinical observations, patient history, and epidemiological information. The expected result is Negative.  Fact Sheet for Patients:  BloggerCourse.com  Fact Sheet for Healthcare Providers:  SeriousBroker.it  This test is no t yet approved or cleared by the [place]  FDA and  has been authorized for detection and/or diagnosis of SARS-CoV-2 by FDA under an Emergency Use Authorization (EUA). This EUA will remain  in effect (meaning this test can be used) for the duration of the COVID-19 declaration under Section 564(b)(1) of the Act, 21 U.S.C.section 360bbb-3(b)(1), unless the authorization is terminated  or revoked sooner.       Influenza A by PCR NEGATIVE NEGATIVE Final   Influenza B by PCR NEGATIVE NEGATIVE Final    Comment: (NOTE) The Xpert Xpress SARS-CoV-2/FLU/RSV plus assay is intended as an aid in the diagnosis of influenza from Nasopharyngeal swab specimens and should not be used as a sole basis for treatment. Nasal washings and aspirates are unacceptable for Xpert Xpress SARS-CoV-2/FLU/RSV testing.  Fact Sheet for Patients: BloggerCourse.com  Fact Sheet for Healthcare Providers: SeriousBroker.it  This test is not yet approved or cleared by the [place]  FDA and has been authorized for detection and/or diagnosis of SARS-CoV-2 by FDA under an Emergency Use Authorization (EUA). This EUA will remain in effect (meaning  this test can be used) for the duration of the COVID-19 declaration under Section 564(b)(1) of the Act, 21 U.S.C. section 360bbb-3(b)(1), unless the authorization is terminated or revoked.     Resp Syncytial Virus by PCR NEGATIVE NEGATIVE Final    Comment: (NOTE) Fact Sheet for Patients: BloggerCourse.com  Fact Sheet for Healthcare Providers: SeriousBroker.it  This test is not yet approved or cleared by the [place]  FDA and has been authorized for detection and/or diagnosis of SARS-CoV-2 by FDA under an Emergency Use Authorization (EUA). This EUA will remain in effect (meaning this test can be used) for the duration of the COVID-19 declaration under Section 564(b)(1) of the Act, 21 U.S.C. section 360bbb-3(b)(1), unless the authorization is terminated or revoked.  Performed at [facility], [address] [place], [place] [address]      Radiological Exams on Admission: CT ABDOMEN PELVIS W CONTRAST Result Date: 10/08/2023 CLINICAL DATA:  Concern for bowel obstruction. History of sickle cell. EXAM: CT ABDOMEN AND PELVIS WITH CONTRAST TECHNIQUE: Multidetector CT imaging of the abdomen and pelvis was performed using the standard protocol following bolus administration of intravenous contrast. RADIATION DOSE REDUCTION: This exam was performed according to the departmental dose-optimization program which includes automated exposure control, adjustment of the mA and/or kV according to patient size and/or use of iterative reconstruction technique. CONTRAST:  OMNIPAQUE  IOHEXOL  300 MG/ML  SOLN COMPARISON:  CT abdomen/pelvis dated 07/11/2017. FINDINGS: Lower chest: Mild bibasilar scarring/atelectasis. Patchy areas of ground-glass tree-in-bud nodularity in the right lower lobe may be secondary to an infectious/inflammatory process. Hepatobiliary: Stable subcentimeter focal hypodensity at the right hepatic dome, most  compatible with a benign etiology such as a small cyst or hemangioma, for which no follow-up imaging is recommended. No suspicious focal hepatic lesion. Status post cholecystectomy. No biliary dilatation. Pancreas: Unremarkable. No pancreatic ductal dilatation or surrounding inflammatory changes. Spleen: Progressive auto infarction of the  spleen. Adrenals/Urinary Tract: Adrenal glands are unremarkable. Right pelvic kidney and left kidney are unremarkable. No evidence of urolithiasis or hydronephrosis. Bladder is unremarkable. Stomach/Bowel: No obstruction. Stomach is within normal limits. Appendix appears normal. The ascending, transverse, and descending colon are collapsed, limiting detailed evaluation, with wall thickening and trace adjacent fluid at the right hepatic flexure, which may be secondary to a nonspecific colitis. Vascular/Lymphatic: Abdominal aorta is normal in caliber. No enlarged abdominal or pelvic lymph nodes. Reproductive: Prostate is unremarkable. Other: No abdominopelvic ascites. No intraperitoneal free air. No abdominal wall hernia. Musculoskeletal: No acute osseous abnormality. No aggressive osseous lesion. IMPRESSION: 1. No evidence of bowel obstruction. 2. The ascending, transverse, and descending colon are collapsed, limiting detailed evaluation, with wall thickening and trace adjacent fluid at the right hepatic flexure, which may be secondary to a nonspecific colitis. 3. Progressive auto infarction of the spleen. 4. Patchy areas of ground-glass tree-in-bud nodularity in the right lower lobe may be secondary to an infectious/inflammatory process. Electronically Signed   By: Harrietta Sherry M.D.   On: 10/08/2023 16:40    Assessment/Plan Principal Problem:   Pneumonia Active Problems:   CAP (community acquired pneumonia)   #1 constipation patient reports not having a BM for 6 days and he could not have a bowel movement in the ER.  He was discharged from the hospital 10/07/2023.  Will  treat him with MiraLAX  senna Dulcolax suppository smog enema and lactulose .  CT abdomen shows no evidence of obstruction.  He complains of nausea abdominal pain and constipation.  #2  Abnormal CT appearance of the right lower lobe of the lungs with complaints of cough.  Patient on room air.  Since he was in the hospital for the past few days I am going to treat like HCAP With cefepime .  Check MRSA PCR COVID RSV flu and respiratory virus panel.  I suspect he should be able to be discharged home tomorrow once he has bowel movements on p.o. antibiotics.  #3 sickle cell disease continue home oxycodone  and Cymbalta   #4 hypertension restarting beta-blockers and holding the others he appears dry will treat him with some IV fluids tonight   Estimated body mass index is 31.32 kg/m as calculated from the following:   Height as of 10/02/23: 5' 7 (1.702 m).   Weight as of 10/02/23: 90.7 kg.   DVT prophylaxis: Lovenox  Code Status: Full code Family Communication: None Disposition Plan: Observation Consults called: None Admission status: Observation admit to sickle cell   Almarie KANDICE Hoots MD  10/08/2023, 5:56 PM

## 2023-10-08 NOTE — ED Triage Notes (Signed)
 Pt states he has not had a BM since last Tuesday, was just released from the hospital yesterday. Pt c/o pain from having to go.

## 2023-10-08 NOTE — ED Provider Notes (Signed)
 [place] EMERGENCY DEPARTMENT AT [facility] Provider Note   CSN: [id number] Arrival date & time: 10/08/23  1211     Patient presents with: Constipation   Dean Webb is a 48 y.o. male.   48 year old male presenting with abdominal pain, constipation.  Patient was hospitalized from 8/19 to 8/24 for sickle cell pain crisis, he tells me that he has not had a bowel movement in approximately 6 days, he describes lower abdominal pain associated with this.  He tells me that he was not given any medications for constipation while hospitalized.  He also tells me that he has been unable to pass gas during this time, but denies nausea/vomiting.  He says that the abdominal pain he is experiencing today is different from his usual sickle cell pain crisis.  He also tells me that he has had a productive cough that has been getting worse since he was first admitted to the hospital, he tells me that he was not on antibiotics during his hospitalization, describes green sputum production with cough.  Denies fever.   Constipation      Prior to Admission medications   Medication Sig Start Date End Date Taking? Authorizing Provider  albuterol  (VENTOLIN  HFA) 108 (90 Base) MCG/ACT inhaler TAKE 2 PUFFS BY MOUTH EVERY 6 HOURS AS NEEDED FOR WHEEZE OR SHORTNESS OF BREATH 09/05/22   Oley Bascom RAMAN, NP  amLODipine  (NORVASC ) 10 MG tablet Take 1 tablet (10 mg total) by mouth daily. 04/17/22   Oley Bascom RAMAN, NP  cetirizine  (ZYRTEC ) 10 MG tablet TAKE 1 TABLET BY MOUTH EVERY DAY AS NEEDED FOR ALLERGY 04/17/22   Nichols, Tonya S, NP  cloNIDine  (CATAPRES ) 0.1 MG tablet TAKE 1 TABLET BY MOUTH 2 TIMES DAILY. 07/17/22   Nichols, Tonya S, NP  dextromethorphan -guaiFENesin  (MUCINEX  DM) 30-600 MG 12hr tablet Take 1 tablet by mouth 2 (two) times daily. 10/07/23   Jegede, Olugbemiga E, MD  DULoxetine  (CYMBALTA ) 30 MG capsule Take 1 capsule (30 mg total) by mouth daily. 08/25/22   Pokhrel, Laxman, MD  gabapentin   (NEURONTIN ) 300 MG capsule Take 1 capsule (300 mg total) by mouth 2 (two) times daily. Patient taking differently: Take 300 mg by mouth 2 (two) times daily as needed. 07/17/22   Oley Bascom RAMAN, NP  losartan -hydrochlorothiazide  (HYZAAR) 100-25 MG tablet Take 1 tablet by mouth daily. Patient not taking: Reported on 10/02/2023 08/27/22   Pokhrel, Laxman, MD  metoprolol  succinate (TOPROL -XL) 50 MG 24 hr tablet Take 1 tablet (50 mg total) by mouth daily. Take with or immediately following a meal. Patient not taking: Reported on 10/02/2023 04/17/22   Oley Bascom RAMAN, NP  montelukast  (SINGULAIR ) 10 MG tablet TAKE 1 TABLET BY MOUTH EVERYDAY AT BEDTIME 04/17/22   Nichols, Tonya S, NP  nicotine  (NICODERM CQ  - DOSED IN MG/24 HOURS) 21 mg/24hr patch Place 1 patch (21 mg total) onto the skin daily. 05/06/23   Marry Clamp, MD  oxyCODONE  10 MG TABS Take 1 tablet (10 mg total) by mouth every 6 (six) hours as needed for moderate pain (pain score 4-6). 10/07/23   Jegede, Olugbemiga E, MD  pantoprazole  (PROTONIX ) 40 MG tablet Take 1 tablet (40 mg total) by mouth daily. 04/17/22   Oley Bascom RAMAN, NP  tiZANidine  (ZANAFLEX ) 4 MG tablet Take 1 tablet (4 mg total) by mouth every 6 (six) hours as needed for muscle spasms. Patient not taking: Reported on 10/02/2023 10/13/22   Oley Bascom RAMAN, NP    Allergies: Patient has no known  allergies.    Review of Systems  Respiratory:  Positive for cough.   Gastrointestinal:  Positive for constipation.    Updated Vital Signs  Vitals:   10/08/23 1243 10/08/23 1510  BP: 134/89 123/86  Pulse: 70 82  Resp: 18 16  Temp: 97.9 F (36.6 C)   TempSrc: Oral   SpO2: 100% 98%     Physical Exam Vitals and nursing note reviewed.  Constitutional:      General: He is in acute distress.  HENT:     Head: Normocephalic.  Eyes:     Extraocular Movements: Extraocular movements intact.  Cardiovascular:     Rate and Rhythm: Normal rate.  Pulmonary:     Effort: Pulmonary effort is  normal.     Breath sounds: Rhonchi (right lung fields) present.  Abdominal:     Palpations: Abdomen is soft.     Tenderness: There is abdominal tenderness (lower quadrants). There is no guarding.  Musculoskeletal:     Cervical back: Normal range of motion.     Comments: Moves all extremities spontaneously without difficulty  Skin:    General: Skin is warm and dry.  Neurological:     Mental Status: He is alert and oriented to person, place, and time.     (all labs ordered are listed, but only abnormal results are displayed) Labs Reviewed  CBC - Abnormal; Notable for the following components:      Result Value   WBC 14.1 (*)    RBC 3.84 (*)    Hemoglobin 11.2 (*)    HCT 30.0 (*)    MCV 78.1 (*)    MCHC 37.3 (*)    Platelets 426 (*)    nRBC 1.1 (*)    All other components within normal limits  BASIC METABOLIC PANEL WITH GFR - Abnormal; Notable for the following components:   Glucose, Bld 105 (*)    Calcium 10.4 (*)    All other components within normal limits  RESPIRATORY PANEL BY PCR  RESP PANEL BY RT-PCR (RSV, FLU A&B, COVID)  RVPGX2  LIPASE, BLOOD    EKG: None  Radiology: CT ABDOMEN PELVIS W CONTRAST Result Date: 10/08/2023 CLINICAL DATA:  Concern for bowel obstruction. History of sickle cell. EXAM: CT ABDOMEN AND PELVIS WITH CONTRAST TECHNIQUE: Multidetector CT imaging of the abdomen and pelvis was performed using the standard protocol following bolus administration of intravenous contrast. RADIATION DOSE REDUCTION: This exam was performed according to the departmental dose-optimization program which includes automated exposure control, adjustment of the mA and/or kV according to patient size and/or use of iterative reconstruction technique. CONTRAST:  OMNIPAQUE  IOHEXOL  300 MG/ML  SOLN COMPARISON:  CT abdomen/pelvis dated 07/11/2017. FINDINGS: Lower chest: Mild bibasilar scarring/atelectasis. Patchy areas of ground-glass tree-in-bud nodularity in the right lower lobe  may be secondary to an infectious/inflammatory process. Hepatobiliary: Stable subcentimeter focal hypodensity at the right hepatic dome, most compatible with a benign etiology such as a small cyst or hemangioma, for which no follow-up imaging is recommended. No suspicious focal hepatic lesion. Status post cholecystectomy. No biliary dilatation. Pancreas: Unremarkable. No pancreatic ductal dilatation or surrounding inflammatory changes. Spleen: Progressive auto infarction of the spleen. Adrenals/Urinary Tract: Adrenal glands are unremarkable. Right pelvic kidney and left kidney are unremarkable. No evidence of urolithiasis or hydronephrosis. Bladder is unremarkable. Stomach/Bowel: No obstruction. Stomach is within normal limits. Appendix appears normal. The ascending, transverse, and descending colon are collapsed, limiting detailed evaluation, with wall thickening and trace adjacent fluid at the right hepatic flexure,  which may be secondary to a nonspecific colitis. Vascular/Lymphatic: Abdominal aorta is normal in caliber. No enlarged abdominal or pelvic lymph nodes. Reproductive: Prostate is unremarkable. Other: No abdominopelvic ascites. No intraperitoneal free air. No abdominal wall hernia. Musculoskeletal: No acute osseous abnormality. No aggressive osseous lesion. IMPRESSION: 1. No evidence of bowel obstruction. 2. The ascending, transverse, and descending colon are collapsed, limiting detailed evaluation, with wall thickening and trace adjacent fluid at the right hepatic flexure, which may be secondary to a nonspecific colitis. 3. Progressive auto infarction of the spleen. 4. Patchy areas of ground-glass tree-in-bud nodularity in the right lower lobe may be secondary to an infectious/inflammatory process. Electronically Signed   By: Harrietta Sherry M.D.   On: 10/08/2023 16:40     Procedures   Medications Ordered in the ED  vancomycin  (VANCOCIN ) IVPB 1000 mg/200 mL premix (has no administration in time  range)  ceFEPIme  (MAXIPIME ) 2 g in sodium chloride  0.9 % 100 mL IVPB (2 g Intravenous New Bag/Given 10/08/23 1739)  iohexol  (OMNIPAQUE ) 300 MG/ML solution 100 mL (100 mLs Intravenous Contrast Given 10/08/23 1601)  HYDROmorphone  (DILAUDID ) injection 1 mg (1 mg Intravenous Given 10/08/23 1737)                                    Medical Decision Making This patient presents to the ED for concern of constipation and cough, this involves an extensive number of treatment options, and is a complaint that carries with it a high risk of complications and morbidity.  The differential diagnosis includes bowel obstruction, constipation secondary to medications, sickle cell pain crisis, pneumonia, bronchitis.   Co morbidities that complicate the patient evaluation  Sickle cell disease   Additional history obtained:  Additional history obtained from record review External records from outside source obtained and reviewed including recent hospital discharge note   Lab Tests:  I Ordered, and personally interpreted labs.  The pertinent results include: CBC notable for leukocytosis with white blood cell count of 14.1 with thrombocytosis with platelet count of 426, otherwise largely stable as compared to labs from yesterday.  BMP stable from previous.  Lipase within normal limits.   Imaging Studies ordered:  I ordered imaging studies including CT abdomen/pelvis  I independently visualized and interpreted imaging which showed 1. No evidence of bowel obstruction. 2. The ascending, transverse, and descending colon are collapsed, limiting detailed evaluation, with wall thickening and trace adjacent fluid at the right hepatic flexure, which may be secondary to a nonspecific colitis. 3. Progressive auto infarction of the spleen. 4. Patchy areas of ground-glass tree-in-bud nodularity in the right lower lobe may be secondary to an infectious/inflammatory process.  I agree with the radiologist  interpretation   Cardiac Monitoring: / EKG:  The patient was maintained on a cardiac monitor.  I personally viewed and interpreted the cardiac monitored which showed an underlying rhythm of: NSR   Consultations Obtained:  I requested consultation with the hospitalist,  and discussed lab and imaging findings as well as pertinent plan - they recommend: I spoke with Dr. Alvia who agrees that this patient would benefit from admission for suspected pneumonia.    Problem List / ED Course / Critical interventions / Medication management  I ordered medication including Dilaudid  for pain, vancomycin  and cefepime  for suspected hospital-acquired pneumonia Reevaluation of the patient after these medicines showed that the patient improved I have reviewed the patients home medicines and have made  adjustments as needed   Social Determinants of Health:  Tobacco use   Test / Admission - Considered:  Physical exam notable as above.  I am concerned for a bowel obstruction given that patient has not had a bowel movement in 6 days nor is he able to pass gas, will obtain CT abdomen/pelvis.  No obstruction noted on imaging, however there was an incidental finding of groundglass/tree-in-bud appearance suggesting infectious versus inflammatory etiology in the right lower lobe.  At time of my reassessment, patient tells me that he has had a cough since prior to his hospitalization, he was not treated with any antibiotics during the course of his hospitalization and continues to have green sputum production.  Given these findings as well as leukocytosis noted on CBC, I do feel the patient would benefit from IV antibiotics to cover for hospital-acquired pneumonia.  I have also given him Dilaudid  for his abdominal pain.  I discussed these findings with the hospitalist who agrees that he is appropriate for admission.  He would likely benefit from a bowel regimen during his admission, as he is on chronic pain  medications for control of his sickle cell disease, and this is likely contributing to his worsening constipation.    Amount and/or Complexity of Data Reviewed Labs: ordered. Radiology: ordered.  Risk Prescription drug management. Decision regarding hospitalization.        Final diagnoses:  Constipation, unspecified constipation type  Pneumonia of right lower lobe due to infectious organism  Abdominal pain, unspecified abdominal location    ED Discharge Orders     None          Glendia Rocky LOISE DEVONNA 10/08/23 1801    Armenta Canning, MD 10/09/23 [phone]

## 2023-10-09 DIAGNOSIS — D5700 Hb-SS disease with crisis, unspecified: Secondary | ICD-10-CM | POA: Diagnosis present

## 2023-10-09 DIAGNOSIS — J189 Pneumonia, unspecified organism: Secondary | ICD-10-CM | POA: Diagnosis present

## 2023-10-09 DIAGNOSIS — Z833 Family history of diabetes mellitus: Secondary | ICD-10-CM | POA: Diagnosis not present

## 2023-10-09 DIAGNOSIS — I10 Essential (primary) hypertension: Secondary | ICD-10-CM | POA: Diagnosis present

## 2023-10-09 DIAGNOSIS — Z832 Family history of diseases of the blood and blood-forming organs and certain disorders involving the immune mechanism: Secondary | ICD-10-CM | POA: Diagnosis not present

## 2023-10-09 DIAGNOSIS — Z825 Family history of asthma and other chronic lower respiratory diseases: Secondary | ICD-10-CM | POA: Diagnosis not present

## 2023-10-09 DIAGNOSIS — D735 Infarction of spleen: Secondary | ICD-10-CM | POA: Diagnosis present

## 2023-10-09 DIAGNOSIS — Y95 Nosocomial condition: Secondary | ICD-10-CM | POA: Diagnosis present

## 2023-10-09 DIAGNOSIS — K5900 Constipation, unspecified: Secondary | ICD-10-CM | POA: Diagnosis present

## 2023-10-09 DIAGNOSIS — G894 Chronic pain syndrome: Secondary | ICD-10-CM | POA: Diagnosis present

## 2023-10-09 DIAGNOSIS — D638 Anemia in other chronic diseases classified elsewhere: Secondary | ICD-10-CM | POA: Diagnosis present

## 2023-10-09 DIAGNOSIS — F17210 Nicotine dependence, cigarettes, uncomplicated: Secondary | ICD-10-CM | POA: Diagnosis present

## 2023-10-09 DIAGNOSIS — Z1152 Encounter for screening for COVID-19: Secondary | ICD-10-CM | POA: Diagnosis not present

## 2023-10-09 DIAGNOSIS — J4520 Mild intermittent asthma, uncomplicated: Secondary | ICD-10-CM | POA: Diagnosis present

## 2023-10-09 DIAGNOSIS — Z8249 Family history of ischemic heart disease and other diseases of the circulatory system: Secondary | ICD-10-CM | POA: Diagnosis not present

## 2023-10-09 LAB — COMPREHENSIVE METABOLIC PANEL WITH GFR
ALT: 24 U/L (ref 0–44)
AST: 20 U/L (ref 15–41)
Albumin: 3.7 g/dL (ref 3.5–5.0)
Alkaline Phosphatase: 94 U/L (ref 38–126)
Anion gap: 7 (ref 5–15)
BUN: 12 mg/dL (ref 6–20)
CO2: 24 mmol/L (ref 22–32)
Calcium: 10.4 mg/dL — ABNORMAL HIGH (ref 8.9–10.3)
Chloride: 106 mmol/L (ref 98–111)
Creatinine, Ser: 0.95 mg/dL (ref 0.61–1.24)
GFR, Estimated: >60 mL/min (ref >60)
Glucose, Bld: 93 mg/dL (ref 70–99)
Potassium: 4 mmol/L (ref 3.5–5.1)
Sodium: 137 mmol/L (ref 135–145)
Total Bilirubin: 1.5 mg/dL — ABNORMAL HIGH (ref 0.0–1.2)
Total Protein: 7.1 g/dL (ref 6.5–8.1)

## 2023-10-09 LAB — RESPIRATORY PANEL BY PCR

## 2023-10-09 LAB — CBC
HCT: 28.4 % — ABNORMAL LOW (ref 39.0–52.0)
Hemoglobin: 10.2 g/dL — ABNORMAL LOW (ref 13.0–17.0)
MCH: 28.2 pg (ref 26.0–34.0)
MCHC: 35.9 g/dL (ref 30.0–36.0)
MCV: 78.5 fL — ABNORMAL LOW (ref 80.0–100.0)
Platelets: 429 K/uL — ABNORMAL HIGH (ref 150–400)
RBC: 3.62 MIL/uL — ABNORMAL LOW (ref 4.22–5.81)
RDW: 14.7 % (ref 11.5–15.5)
WBC: 12.8 K/uL — ABNORMAL HIGH (ref 4.0–10.5)
nRBC: 1.3 % — ABNORMAL HIGH (ref 0.0–0.2)

## 2023-10-09 MED ORDER — BENZONATATE 100 MG PO CAPS
100.0000 mg | ORAL_CAPSULE | Freq: Two times a day (BID) | ORAL | Status: DC | PRN
  Administered 2023-10-11: 100 mg via ORAL
  Filled 2023-10-09: qty 1

## 2023-10-09 NOTE — TOC Initial Note (Signed)
 Transition of Care [facility]) - Initial/Assessment Note    Patient Details  Name: Dean Webb MRN: [id number] Date of Birth: [birth year]  [facility] of [facility]) CM/SW Contact:    Doneta Glenys DASEN, RN Phone Number: 10/09/2023, 3:52 PM  Clinical Narrative:                 Presented for constipation after recent discharge(8/24) from hospital. Patient prior admission was due to a Sickle  Cell Crisis. Patient states PTA lives alone in an apartment; verified PCP/insurance, denies DME, HH, oxygen and SDOH needs;and patients brother Alm [phone] will transport home at discharge. No inpatient case management needs identified during visit. Place consult if needs present.  Expected Discharge Plan: Home/Self Care Barriers to Discharge: No Barriers Identified   Patient Goals and CMS Choice Patient states their goals for this hospitalization and ongoing recovery are:: Home CMS Medicare.gov Compare Post Acute Care list provided to::  (NA) Choice offered to / list presented to : NA [place] ownership interest in [facility].provided to:: Parent NA    Expected Discharge Plan and Services In-house Referral: NA Discharge Planning Services: CM Consult Post Acute Care Choice: NA                   DME Arranged: N/A DME Agency: NA       HH Arranged: NA HH Agency: NA        Prior Living Arrangements/Services   Lives with:: Self Patient language and need for interpreter reviewed:: Yes Do you feel safe going back to the place where you live?: Yes      Need for Family Participation in Patient Care: No (Comment) Care giver support system in place?: Yes (comment) Current home services:  (Denies) Criminal Activity/Legal Involvement Pertinent to Current Situation/Hospitalization: No - Comment as needed  Activities of Daily Living   ADL Screening (condition at time of admission) Independently performs ADLs?: Yes (appropriate for developmental age) Is the patient deaf or  have difficulty hearing?: No Does the patient have difficulty seeing, even when wearing glasses/contacts?: No Does the patient have difficulty concentrating, remembering, or making decisions?: No  Permission Sought/Granted Permission sought to share information with : Case Manager Permission granted to share information with : Yes, Verbal Permission Granted  Share Information with NAME: Dorann Macintosh (Sister)  [phone] and Ronne (Brother) [phone]           Emotional Assessment Appearance:: Appears stated age Attitude/Demeanor/Rapport: Engaged Affect (typically observed): Appropriate Orientation: : Oriented to Self, Oriented to Place, Oriented to  Time, Oriented to Situation Alcohol / Substance Use: Not Applicable Psych Involvement: No (comment)  Admission diagnosis:  Pneumonia [J18.9] CAP (community acquired pneumonia) [J18.9] Abdominal pain, unspecified abdominal location [R10.9] Constipation, unspecified constipation type [K59.00] Pneumonia of right lower lobe due to infectious organism [J18.9] Sickle cell anemia with pain (HCC) [D57.00] Patient Active Problem List   Diagnosis Date Noted   Sickle-cell disease with pain (HCC) 10/09/2023   Sickle cell anemia with pain (HCC) 10/09/2023   CAP (community acquired pneumonia) 10/08/2023   Abdominal pain 10/08/2023   Sickle cell crisis (HCC) 09/20/2023   Substance abuse (HCC) 04/30/2023   Vasoocclusive sickle cell crisis (HCC) 04/01/2023   Chronic pain syndrome 10/26/2022   Rhabdomyolysis 08/23/2022   Mild intermittent asthma without complication 07/17/2022   Sickle cell pain crisis (HCC) 06/28/2022   Acute cough 06/27/2022   Pneumonia 06/27/2022   GERD (gastroesophageal reflux disease) 06/27/2022   Hypercalcemia 06/27/2022   Overweight (BMI 25.0-29.9) 06/15/2021  Seasonal allergies 06/15/2021   Vitamin D  deficiency 02/23/2020   Tobacco dependence    Cocaine  abuse (HCC) 08/08/2017   H. 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 [place] [address] Phone: [phone] Fax: [phone]     Social Drivers of Health (SDOH) Social History: SDOH Screenings   Food Insecurity: No Food Insecurity (10/08/2023)  Housing: Low Risk  (10/08/2023)  Transportation Needs: No Transportation Needs (10/08/2023)  Utilities: Not At Risk (10/08/2023)  Depression (PHQ2-9): Low Risk  (05/06/2023)  Recent Concern: Depression (PHQ2-9) - High Risk (04/30/2023)  Tobacco Use: High Risk (10/08/2023)   SDOH Interventions:     Readmission Risk Interventions    10/09/2023    3:47 PM  Readmission Risk Prevention Plan  Transportation Screening Complete  PCP or Specialist Appt within 3-5 Days Complete  HRI or Home Care Consult Complete  Social Work Consult for Recovery Care Planning/Counseling Complete  Palliative Care Screening Not Applicable  Medication Review Oceanographer) Complete

## 2023-10-09 NOTE — Plan of Care (Signed)

## 2023-10-09 NOTE — Progress Notes (Cosign Needed)
 Patient ID: Dean Webb, male   DOB: Nov 21, 1975, 48 y.o.   MRN: [id number] Subjective: Dean Webb is a 48 y.o. male with medical history significant of sickle cell disease, mild intermittent asthma, GERD, hypertension, cocaine  abuse, ongoing smoker presents to the emergency room with constipation, Nausea, bilateral thigh pain, was admitted for sickle cell pain crisis.    Patient continues to complain of significant pain, saying his pain is a 7/10 this morning.  His goal is a pain of less than 8/10.  He denies any fever, chest pain, shortness of breath, nausea, vomiting or diarrhea.  No urinary symptoms.    Objective:  Vital signs in last 24 hours:  Vitals:   10/09/23 0409 10/09/23 0749 10/09/23 1005 10/09/23 1152  BP: (!) 143/85 (!) 153/80 (!) 140/90 (!) 146/91  Pulse: 82 (!) 58 75 74  Resp: 18 16  16   Temp: 98.4 F (36.9 C) 98.6 F (37 C)  98.8 F (37.1 C)  TempSrc: Oral Oral  Oral  SpO2: 96% 100%  100%  Height:        Intake/Output from previous day:   Intake/Output Summary (Last 24 hours) at 10/09/2023 1459 Last data filed at 10/09/2023 9247 Gross per 24 hour  Intake 2547.2 ml  Output 0 ml  Net 2547.2 ml    Physical Exam: General: Alert, awake, oriented x3, in no acute distress.  HEENT: [place]/AT PEERL, EOMI Neck: Trachea midline,  no masses, no thyromegal,y no JVD, no carotid bruit OROPHARYNX:  Moist, No exudate/ erythema/lesions.  Heart: Regular rate and rhythm, without murmurs, rubs, gallops, PMI non-displaced, no heaves or thrills on palpation.  Lungs: Clear to auscultation, no wheezing or rhonchi noted. No increased vocal fremitus resonant to percussion  Abdomen: Soft, nontender, nondistended, positive bowel sounds, no masses no hepatosplenomegaly noted..  Neuro: No focal neurological deficits noted cranial nerves II through XII grossly intact. DTRs 2+ bilaterally upper and lower extremities. Strength 5 out of 5 in bilateral upper and lower  extremities. Musculoskeletal:Generalize body tenderness Psychiatric: Patient alert and oriented x3, good insight and cognition, good recent to remote recall. Lymph node survey: No cervical axillary or inguinal lymphadenopathy noted.  Lab Results:  Basic Metabolic Panel:    Component Value Date/Time   NA 137 10/09/2023 0426   NA 135 11/09/2022 1416   K 4.0 10/09/2023 0426   CL 106 10/09/2023 0426   CO2 24 10/09/2023 0426   BUN 12 10/09/2023 0426   BUN 13 11/09/2022 1416   CREATININE 0.95 10/09/2023 0426   GLUCOSE 93 10/09/2023 0426   CALCIUM 10.4 (H) 10/09/2023 0426   CBC:    Component Value Date/Time   WBC 12.8 (H) 10/09/2023 0426   HGB 10.2 (L) 10/09/2023 0426   HGB 14.4 11/09/2022 1416   HCT 28.4 (L) 10/09/2023 0426   HCT 41.0 11/09/2022 1416   PLT 429 (H) 10/09/2023 0426   PLT 303 11/09/2022 1416   MCV 78.5 (L) 10/09/2023 0426   MCV 88 11/09/2022 1416   NEUTROABS 7.0 10/02/2023 1931   NEUTROABS 6.7 11/09/2022 1416   LYMPHSABS 2.6 10/02/2023 1931   LYMPHSABS 4.2 (H) 11/09/2022 1416   MONOABS 1.4 (H) 10/02/2023 1931   EOSABS 0.6 (H) 10/02/2023 1931   EOSABS 0.4 11/09/2022 1416   BASOSABS 0.1 10/02/2023 1931   BASOSABS 0.1 11/09/2022 1416    Recent Results (from the past 240 hours)  Resp panel by RT-PCR (RSV, Flu A&B, Covid) Anterior Nasal Swab     Status: None  Collection Time: 10/02/23  7:42 PM   Specimen: Anterior Nasal Swab  Result Value Ref Range Status   SARS Coronavirus 2 by RT PCR NEGATIVE NEGATIVE Final    Comment: (NOTE) SARS-CoV-2 target nucleic acids are NOT DETECTED.  The SARS-CoV-2 RNA is generally detectable in upper respiratory specimens during the acute phase of infection. The lowest concentration of SARS-CoV-2 viral copies this assay can detect is 138 copies/mL. A negative result does not preclude SARS-Cov-2 infection and should not be used as the sole basis for treatment or other patient management decisions. A negative result may occur  with  improper specimen collection/handling, submission of specimen other than nasopharyngeal swab, presence of viral mutation(s) within the areas targeted by this assay, and inadequate number of viral copies(<138 copies/mL). A negative result must be combined with clinical observations, patient history, and epidemiological information. The expected result is Negative.  Fact Sheet for Patients:  BloggerCourse.com  Fact Sheet for Healthcare Providers:  SeriousBroker.it  This test is no t yet approved or cleared by the [place]  FDA and  has been authorized for detection and/or diagnosis of SARS-CoV-2 by FDA under an Emergency Use Authorization (EUA). This EUA will remain  in effect (meaning this test can be used) for the duration of the COVID-19 declaration under Section 564(b)(1) of the Act, 21 U.S.C.section 360bbb-3(b)(1), unless the authorization is terminated  or revoked sooner.       Influenza A by PCR NEGATIVE NEGATIVE Final   Influenza B by PCR NEGATIVE NEGATIVE Final    Comment: (NOTE) The Xpert Xpress SARS-CoV-2/FLU/RSV plus assay is intended as an aid in the diagnosis of influenza from Nasopharyngeal swab specimens and should not be used as a sole basis for treatment. Nasal washings and aspirates are unacceptable for Xpert Xpress SARS-CoV-2/FLU/RSV testing.  Fact Sheet for Patients: BloggerCourse.com  Fact Sheet for Healthcare Providers: SeriousBroker.it  This test is not yet approved or cleared by the [place]  FDA and has been authorized for detection and/or diagnosis of SARS-CoV-2 by FDA under an Emergency Use Authorization (EUA). This EUA will remain in effect (meaning this test can be used) for the duration of the COVID-19 declaration under Section 564(b)(1) of the Act, 21 U.S.C. section 360bbb-3(b)(1), unless the authorization is terminated  or revoked.     Resp Syncytial Virus by PCR NEGATIVE NEGATIVE Final    Comment: (NOTE) Fact Sheet for Patients: BloggerCourse.com  Fact Sheet for Healthcare Providers: SeriousBroker.it  This test is not yet approved or cleared by the [place]  FDA and has been authorized for detection and/or diagnosis of SARS-CoV-2 by FDA under an Emergency Use Authorization (EUA). This EUA will remain in effect (meaning this test can be used) for the duration of the COVID-19 declaration under Section 564(b)(1) of the Act, 21 U.S.C. section 360bbb-3(b)(1), unless the authorization is terminated or revoked.  Performed at [facility], [address] [place], [place] [address]   Respiratory (~20 pathogens) panel by PCR     Status: Abnormal   Collection Time: 10/08/23  6:33 PM   Specimen: Nasopharyngeal Swab; Respiratory  Result Value Ref Range Status   Adenovirus NOT DETECTED NOT DETECTED Final   Coronavirus 229E NOT DETECTED NOT DETECTED Final    Comment: (NOTE) The Coronavirus on the Respiratory Panel, DOES NOT test for the novel  Coronavirus (2019 nCoV)    Coronavirus HKU1 NOT DETECTED NOT DETECTED Final   Coronavirus NL63 NOT DETECTED NOT DETECTED Final   Coronavirus OC43 NOT DETECTED NOT DETECTED Final  Metapneumovirus NOT DETECTED NOT DETECTED Final   Rhinovirus / Enterovirus DETECTED (A) NOT DETECTED Final   Influenza A NOT DETECTED NOT DETECTED Final   Influenza B NOT DETECTED NOT DETECTED Final   Parainfluenza Virus 1 NOT DETECTED NOT DETECTED Final   Parainfluenza Virus 2 NOT DETECTED NOT DETECTED Final   Parainfluenza Virus 3 NOT DETECTED NOT DETECTED Final   Parainfluenza Virus 4 NOT DETECTED NOT DETECTED Final   Respiratory Syncytial Virus NOT DETECTED NOT DETECTED Final   Bordetella pertussis NOT DETECTED NOT DETECTED Final   Bordetella Parapertussis NOT DETECTED NOT DETECTED Final   Chlamydophila  pneumoniae NOT DETECTED NOT DETECTED Final   Mycoplasma pneumoniae NOT DETECTED NOT DETECTED Final    Comment: Performed at [facility], [address], [place], [place] [address]  Resp panel by RT-PCR (RSV, Flu A&B, Covid) Nasopharyngeal Swab     Status: None   Collection Time: 10/08/23  6:33 PM   Specimen: Nasopharyngeal Swab; Nasal Swab  Result Value Ref Range Status   SARS Coronavirus 2 by RT PCR NEGATIVE NEGATIVE Final    Comment: (NOTE) SARS-CoV-2 target nucleic acids are NOT DETECTED.  The SARS-CoV-2 RNA is generally detectable in upper respiratory specimens during the acute phase of infection. The lowest concentration of SARS-CoV-2 viral copies this assay can detect is 138 copies/mL. A negative result does not preclude SARS-Cov-2 infection and should not be used as the sole basis for treatment or other patient management decisions. A negative result may occur with  improper specimen collection/handling, submission of specimen other than nasopharyngeal swab, presence of viral mutation(s) within the areas targeted by this assay, and inadequate number of viral copies(<138 copies/mL). A negative result must be combined with clinical observations, patient history, and epidemiological information. The expected result is Negative.  Fact Sheet for Patients:  BloggerCourse.com  Fact Sheet for Healthcare Providers:  SeriousBroker.it  This test is no t yet approved or cleared by the [place]  FDA and  has been authorized for detection and/or diagnosis of SARS-CoV-2 by FDA under an Emergency Use Authorization (EUA). This EUA will remain  in effect (meaning this test can be used) for the duration of the COVID-19 declaration under Section 564(b)(1) of the Act, 21 U.S.C.section 360bbb-3(b)(1), unless the authorization is terminated  or revoked sooner.       Influenza A by PCR NEGATIVE NEGATIVE Final   Influenza B by PCR  NEGATIVE NEGATIVE Final    Comment: (NOTE) The Xpert Xpress SARS-CoV-2/FLU/RSV plus assay is intended as an aid in the diagnosis of influenza from Nasopharyngeal swab specimens and should not be used as a sole basis for treatment. Nasal washings and aspirates are unacceptable for Xpert Xpress SARS-CoV-2/FLU/RSV testing.  Fact Sheet for Patients: BloggerCourse.com  Fact Sheet for Healthcare Providers: SeriousBroker.it  This test is not yet approved or cleared by the [place]  FDA and has been authorized for detection and/or diagnosis of SARS-CoV-2 by FDA under an Emergency Use Authorization (EUA). This EUA will remain in effect (meaning this test can be used) for the duration of the COVID-19 declaration under Section 564(b)(1) of the Act, 21 U.S.C. section 360bbb-3(b)(1), unless the authorization is terminated or revoked.     Resp Syncytial Virus by PCR NEGATIVE NEGATIVE Final    Comment: (NOTE) Fact Sheet for Patients: BloggerCourse.com  Fact Sheet for Healthcare Providers: SeriousBroker.it  This test is not yet approved or cleared by the [place]  FDA and has been authorized for detection and/or diagnosis of SARS-CoV-2 by  FDA under an Emergency Use Authorization (EUA). This EUA will remain in effect (meaning this test can be used) for the duration of the COVID-19 declaration under Section 564(b)(1) of the Act, 21 U.S.C. section 360bbb-3(b)(1), unless the authorization is terminated or revoked.  Performed at [facility], [address] [place], [place] [address]   MRSA Next Gen by PCR, Nasal     Status: None   Collection Time: 10/08/23  6:33 PM   Specimen: Nasopharyngeal Swab; Nasal Swab  Result Value Ref Range Status   MRSA by PCR Next Gen NOT DETECTED NOT DETECTED Final    Comment: (NOTE) The GeneXpert MRSA Assay (FDA approved for NASAL  specimens only), is one component of a comprehensive MRSA colonization surveillance program. It is not intended to diagnose MRSA infection nor to guide or monitor treatment for MRSA infections. Test performance is not FDA approved in patients less than 29 years old. Performed at [facility], [address] [place], [place] [address]     Studies/Results: CT ABDOMEN PELVIS W CONTRAST Result Date: 10/08/2023 CLINICAL DATA:  Concern for bowel obstruction. History of sickle cell. EXAM: CT ABDOMEN AND PELVIS WITH CONTRAST TECHNIQUE: Multidetector CT imaging of the abdomen and pelvis was performed using the standard protocol following bolus administration of intravenous contrast. RADIATION DOSE REDUCTION: This exam was performed according to the departmental dose-optimization program which includes automated exposure control, adjustment of the mA and/or kV according to patient size and/or use of iterative reconstruction technique. CONTRAST:  OMNIPAQUE  IOHEXOL  300 MG/ML  SOLN COMPARISON:  CT abdomen/pelvis dated 07/11/2017. FINDINGS: Lower chest: Mild bibasilar scarring/atelectasis. Patchy areas of ground-glass tree-in-bud nodularity in the right lower lobe may be secondary to an infectious/inflammatory process. Hepatobiliary: Stable subcentimeter focal hypodensity at the right hepatic dome, most compatible with a benign etiology such as a small cyst or hemangioma, for which no follow-up imaging is recommended. No suspicious focal hepatic lesion. Status post cholecystectomy. No biliary dilatation. Pancreas: Unremarkable. No pancreatic ductal dilatation or surrounding inflammatory changes. Spleen: Progressive auto infarction of the spleen. Adrenals/Urinary Tract: Adrenal glands are unremarkable. Right pelvic kidney and left kidney are unremarkable. No evidence of urolithiasis or hydronephrosis. Bladder is unremarkable. Stomach/Bowel: No obstruction. Stomach is within normal limits.  Appendix appears normal. The ascending, transverse, and descending colon are collapsed, limiting detailed evaluation, with wall thickening and trace adjacent fluid at the right hepatic flexure, which may be secondary to a nonspecific colitis. Vascular/Lymphatic: Abdominal aorta is normal in caliber. No enlarged abdominal or pelvic lymph nodes. Reproductive: Prostate is unremarkable. Other: No abdominopelvic ascites. No intraperitoneal free air. No abdominal wall hernia. Musculoskeletal: No acute osseous abnormality. No aggressive osseous lesion. IMPRESSION: 1. No evidence of bowel obstruction. 2. The ascending, transverse, and descending colon are collapsed, limiting detailed evaluation, with wall thickening and trace adjacent fluid at the right hepatic flexure, which may be secondary to a nonspecific colitis. 3. Progressive auto infarction of the spleen. 4. Patchy areas of ground-glass tree-in-bud nodularity in the right lower lobe may be secondary to an infectious/inflammatory process. Electronically Signed   By: Harrietta Sherry M.D.   On: 10/08/2023 16:40     Medications: Scheduled Meds:  DULoxetine   30 mg Oral Daily   enoxaparin  (LOVENOX ) injection  40 mg Subcutaneous Q24H   gabapentin   300 mg Oral BID   metoprolol  succinate  50 mg Oral Daily   montelukast   10 mg Oral QHS   pantoprazole   40 mg Oral Daily   pneumococcal  20-valent conjugate vaccine  0.5 mL Intramuscular Tomorrow-1000   polyethylene glycol  17 g Oral BID   senna-docusate  3 tablet Oral QHS   SMOG  400 mL Rectal Once   Continuous Infusions:   PRN Meds:.albuterol , benzonatate , ondansetron  **OR** ondansetron  (ZOFRAN ) IV, oxyCODONE   Consultants: None  Procedures: None  Antibiotics: Cefepime   vancomycin   Assessment/Plan: Principal Problem:   Pneumonia Active Problems:   CAP (community acquired pneumonia)   Abdominal pain   Sickle-cell disease with pain (HCC)   Sickle cell anemia with pain (HCC)  Hb Sickle Cell  Disease with Pain crisis: Continue IV fluid at KVO, Toradol  15 mg Q 6 H for a total of 5 days, continue oral home pain medications as ordered. Monitor vitals very closely, Re-evaluate pain scale regularly, 2 L of Oxygen by [place]. Patient encouraged to ambulate on the hallway today.  Pneumonia: Patchy areas of ground-glass tree-in-bud nodularity in the right       lower lobe may be secondary to an infectious/inflammatory process on CT. Continue antibiotics as prescribed.  Leukocytosis: slightly elevated. started antibiotic on admission for pneumonia infection. Will continue to monitor daily labs. Anemia of Chronic Disease: Hemoglobin is within patients baseline, there is no clinical indication for blood transfusion at this time.  Will continue to monitor closely and transfuse as appropriate. Chronic pain Syndrome: Continue oral home pain medications as ordered. Essential hypertension: Blood pressure is controlled.  Continue to monitor Cough: Continue Tessalon  pearls as ordered   Code Status: Full Code Family Communication: N/A Disposition Plan: Ready for discharge in the a.m. Homer CHRISTELLA Cover NP  If 7PM-7AM, please contact night-coverage.  10/09/2023, 2:59 PM  LOS: 1 day

## 2023-10-10 MED ORDER — SODIUM CHLORIDE 0.9 % IV SOLN
2.0000 g | INTRAVENOUS | Status: DC
  Administered 2023-10-10 – 2023-10-11 (×2): 2 g via INTRAVENOUS
  Filled 2023-10-10 (×2): qty 20

## 2023-10-10 MED ORDER — AZITHROMYCIN 250 MG PO TABS
500.0000 mg | ORAL_TABLET | Freq: Every day | ORAL | Status: DC
  Administered 2023-10-10 – 2023-10-12 (×3): 500 mg via ORAL
  Filled 2023-10-10 (×3): qty 2

## 2023-10-10 NOTE — Progress Notes (Signed)
 Patient ID: Dean Webb, male   DOB: 1975/11/30, 48 y.o.   MRN: [id number] Subjective: Dean Webb is a 48 y.o. male with medical history significant of sickle cell disease, mild intermittent asthma, GERD, hypertension, cocaine  abuse, ongoing smoker presents to the emergency room with constipation, Nausea, bilateral thigh pain, was admitted for sickle cell pain crisis.    Patient reports pain of 7/10 this morning aggravated when coughing.   His goal is a pain of less than 6/10.  He denies any fever, chest pain, shortness of breath, nausea, vomiting or diarrhea.  No urinary symptoms. Cough is gradually improving with tessalon  pearls.   Objective:  Vital signs in last 24 hours:  Vitals:   10/10/23 0813 10/10/23 0950 10/10/23 0950 10/10/23 1210  BP: (!) 143/86 136/86 136/86 (!) 148/100  Pulse: 75 66 66 67  Resp: 20   18  Temp: 98.1 F (36.7 C)   97.6 F (36.4 C)  TempSrc: Oral     SpO2:    100%  Weight: 90.7 kg     Height: 5' 7 (1.702 m)       Intake/Output from previous day:   Intake/Output Summary (Last 24 hours) at 10/10/2023 1353 Last data filed at 10/09/2023 1700 Gross per 24 hour  Intake 120 ml  Output --  Net 120 ml    Physical Exam: General: Alert, awake, oriented x3, in no acute distress.  HEENT: [place]/AT PEERL, EOMI Neck: Trachea midline,  no masses, no thyromegal,y no JVD, no carotid bruit OROPHARYNX:  Moist, No exudate/ erythema/lesions.  Heart: Regular rate and rhythm, without murmurs, rubs, gallops, PMI non-displaced, no heaves or thrills on palpation.  Lungs:  Clear to auscultation, no wheezing or rhonchi noted. No increased vocal fremitus resonant to percussion. Mild Cough Abdomen: Soft, nontender, nondistended, positive bowel sounds, no masses no hepatosplenomegaly noted..  Neuro: No focal neurological deficits noted cranial nerves II through XII grossly intact. DTRs 2+ bilaterally upper and lower extremities. Strength 5 out of 5 in bilateral upper and  lower extremities. Musculoskeletal:Generalize body tenderness Psychiatric: Patient alert and oriented x3, good insight and cognition, good recent to remote recall. Lymph node survey: No cervical axillary or inguinal lymphadenopathy noted.  Lab Results:  Basic Metabolic Panel:    Component Value Date/Time   NA 137 10/09/2023 0426   NA 135 11/09/2022 1416   K 4.0 10/09/2023 0426   CL 106 10/09/2023 0426   CO2 24 10/09/2023 0426   BUN 12 10/09/2023 0426   BUN 13 11/09/2022 1416   CREATININE 0.95 10/09/2023 0426   GLUCOSE 93 10/09/2023 0426   CALCIUM 10.4 (H) 10/09/2023 0426   CBC:    Component Value Date/Time   WBC 12.8 (H) 10/09/2023 0426   HGB 10.2 (L) 10/09/2023 0426   HGB 14.4 11/09/2022 1416   HCT 28.4 (L) 10/09/2023 0426   HCT 41.0 11/09/2022 1416   PLT 429 (H) 10/09/2023 0426   PLT 303 11/09/2022 1416   MCV 78.5 (L) 10/09/2023 0426   MCV 88 11/09/2022 1416   NEUTROABS 7.0 10/02/2023 1931   NEUTROABS 6.7 11/09/2022 1416   LYMPHSABS 2.6 10/02/2023 1931   LYMPHSABS 4.2 (H) 11/09/2022 1416   MONOABS 1.4 (H) 10/02/2023 1931   EOSABS 0.6 (H) 10/02/2023 1931   EOSABS 0.4 11/09/2022 1416   BASOSABS 0.1 10/02/2023 1931   BASOSABS 0.1 11/09/2022 1416    Recent Results (from the past 240 hours)  Resp panel by RT-PCR (RSV, Flu A&B, Covid) Anterior Nasal Swab  Status: None   Collection Time: 10/02/23  7:42 PM   Specimen: Anterior Nasal Swab  Result Value Ref Range Status   SARS Coronavirus 2 by RT PCR NEGATIVE NEGATIVE Final    Comment: (NOTE) SARS-CoV-2 target nucleic acids are NOT DETECTED.  The SARS-CoV-2 RNA is generally detectable in upper respiratory specimens during the acute phase of infection. The lowest concentration of SARS-CoV-2 viral copies this assay can detect is 138 copies/mL. A negative result does not preclude SARS-Cov-2 infection and should not be used as the sole basis for treatment or other patient management decisions. A negative result may  occur with  improper specimen collection/handling, submission of specimen other than nasopharyngeal swab, presence of viral mutation(s) within the areas targeted by this assay, and inadequate number of viral copies(<138 copies/mL). A negative result must be combined with clinical observations, patient history, and epidemiological information. The expected result is Negative.  Fact Sheet for Patients:  BloggerCourse.com  Fact Sheet for Healthcare Providers:  SeriousBroker.it  This test is no t yet approved or cleared by the [place]  FDA and  has been authorized for detection and/or diagnosis of SARS-CoV-2 by FDA under an Emergency Use Authorization (EUA). This EUA will remain  in effect (meaning this test can be used) for the duration of the COVID-19 declaration under Section 564(b)(1) of the Act, 21 U.S.C.section 360bbb-3(b)(1), unless the authorization is terminated  or revoked sooner.       Influenza A by PCR NEGATIVE NEGATIVE Final   Influenza B by PCR NEGATIVE NEGATIVE Final    Comment: (NOTE) The Xpert Xpress SARS-CoV-2/FLU/RSV plus assay is intended as an aid in the diagnosis of influenza from Nasopharyngeal swab specimens and should not be used as a sole basis for treatment. Nasal washings and aspirates are unacceptable for Xpert Xpress SARS-CoV-2/FLU/RSV testing.  Fact Sheet for Patients: BloggerCourse.com  Fact Sheet for Healthcare Providers: SeriousBroker.it  This test is not yet approved or cleared by the [place]  FDA and has been authorized for detection and/or diagnosis of SARS-CoV-2 by FDA under an Emergency Use Authorization (EUA). This EUA will remain in effect (meaning this test can be used) for the duration of the COVID-19 declaration under Section 564(b)(1) of the Act, 21 U.S.C. section 360bbb-3(b)(1), unless the authorization is terminated  or revoked.     Resp Syncytial Virus by PCR NEGATIVE NEGATIVE Final    Comment: (NOTE) Fact Sheet for Patients: BloggerCourse.com  Fact Sheet for Healthcare Providers: SeriousBroker.it  This test is not yet approved or cleared by the [place]  FDA and has been authorized for detection and/or diagnosis of SARS-CoV-2 by FDA under an Emergency Use Authorization (EUA). This EUA will remain in effect (meaning this test can be used) for the duration of the COVID-19 declaration under Section 564(b)(1) of the Act, 21 U.S.C. section 360bbb-3(b)(1), unless the authorization is terminated or revoked.  Performed at [facility], [address], [place], [place] [address]   Respiratory (~20 pathogens) panel by PCR     Status: Abnormal   Collection Time: 10/08/23  6:33 PM   Specimen: Nasopharyngeal Swab; Respiratory  Result Value Ref Range Status   Adenovirus NOT DETECTED NOT DETECTED Final   Coronavirus 229E NOT DETECTED NOT DETECTED Final    Comment: (NOTE) The Coronavirus on the Respiratory Panel, DOES NOT test for the novel  Coronavirus (2019 nCoV)    Coronavirus HKU1 NOT DETECTED NOT DETECTED Final   Coronavirus NL63 NOT DETECTED NOT DETECTED Final   Coronavirus OC43 NOT  DETECTED NOT DETECTED Final   Metapneumovirus NOT DETECTED NOT DETECTED Final   Rhinovirus / Enterovirus DETECTED (A) NOT DETECTED Final   Influenza A NOT DETECTED NOT DETECTED Final   Influenza B NOT DETECTED NOT DETECTED Final   Parainfluenza Virus 1 NOT DETECTED NOT DETECTED Final   Parainfluenza Virus 2 NOT DETECTED NOT DETECTED Final   Parainfluenza Virus 3 NOT DETECTED NOT DETECTED Final   Parainfluenza Virus 4 NOT DETECTED NOT DETECTED Final   Respiratory Syncytial Virus NOT DETECTED NOT DETECTED Final   Bordetella pertussis NOT DETECTED NOT DETECTED Final   Bordetella Parapertussis NOT DETECTED NOT DETECTED Final   Chlamydophila  pneumoniae NOT DETECTED NOT DETECTED Final   Mycoplasma pneumoniae NOT DETECTED NOT DETECTED Final    Comment: Performed at [facility], [address] [place], [place] [address]  Resp panel by RT-PCR (RSV, Flu A&B, Covid) Nasopharyngeal Swab     Status: None   Collection Time: 10/08/23  6:33 PM   Specimen: Nasopharyngeal Swab; Nasal Swab  Result Value Ref Range Status   SARS Coronavirus 2 by RT PCR NEGATIVE NEGATIVE Final    Comment: (NOTE) SARS-CoV-2 target nucleic acids are NOT DETECTED.  The SARS-CoV-2 RNA is generally detectable in upper respiratory specimens during the acute phase of infection. The lowest concentration of SARS-CoV-2 viral copies this assay can detect is 138 copies/mL. A negative result does not preclude SARS-Cov-2 infection and should not be used as the sole basis for treatment or other patient management decisions. A negative result may occur with  improper specimen collection/handling, submission of specimen other than nasopharyngeal swab, presence of viral mutation(s) within the areas targeted by this assay, and inadequate number of viral copies(<138 copies/mL). A negative result must be combined with clinical observations, patient history, and epidemiological information. The expected result is Negative.  Fact Sheet for Patients:  BloggerCourse.com  Fact Sheet for Healthcare Providers:  SeriousBroker.it  This test is no t yet approved or cleared by the [place]  FDA and  has been authorized for detection and/or diagnosis of SARS-CoV-2 by FDA under an Emergency Use Authorization (EUA). This EUA will remain  in effect (meaning this test can be used) for the duration of the COVID-19 declaration under Section 564(b)(1) of the Act, 21 U.S.C.section 360bbb-3(b)(1), unless the authorization is terminated  or revoked sooner.       Influenza A by PCR NEGATIVE NEGATIVE Final   Influenza B by PCR  NEGATIVE NEGATIVE Final    Comment: (NOTE) The Xpert Xpress SARS-CoV-2/FLU/RSV plus assay is intended as an aid in the diagnosis of influenza from Nasopharyngeal swab specimens and should not be used as a sole basis for treatment. Nasal washings and aspirates are unacceptable for Xpert Xpress SARS-CoV-2/FLU/RSV testing.  Fact Sheet for Patients: BloggerCourse.com  Fact Sheet for Healthcare Providers: SeriousBroker.it  This test is not yet approved or cleared by the [place]  FDA and has been authorized for detection and/or diagnosis of SARS-CoV-2 by FDA under an Emergency Use Authorization (EUA). This EUA will remain in effect (meaning this test can be used) for the duration of the COVID-19 declaration under Section 564(b)(1) of the Act, 21 U.S.C. section 360bbb-3(b)(1), unless the authorization is terminated or revoked.     Resp Syncytial Virus by PCR NEGATIVE NEGATIVE Final    Comment: (NOTE) Fact Sheet for Patients: BloggerCourse.com  Fact Sheet for Healthcare Providers: SeriousBroker.it  This test is not yet approved or cleared by the [place]  FDA and has been authorized for  detection and/or diagnosis of SARS-CoV-2 by FDA under an Emergency Use Authorization (EUA). This EUA will remain in effect (meaning this test can be used) for the duration of the COVID-19 declaration under Section 564(b)(1) of the Act, 21 U.S.C. section 360bbb-3(b)(1), unless the authorization is terminated or revoked.  Performed at [facility], [address] [place], [place] [address]   MRSA Next Gen by PCR, Nasal     Status: None   Collection Time: 10/08/23  6:33 PM   Specimen: Nasopharyngeal Swab; Nasal Swab  Result Value Ref Range Status   MRSA by PCR Next Gen NOT DETECTED NOT DETECTED Final    Comment: (NOTE) The GeneXpert MRSA Assay (FDA approved for NASAL  specimens only), is one component of a comprehensive MRSA colonization surveillance program. It is not intended to diagnose MRSA infection nor to guide or monitor treatment for MRSA infections. Test performance is not FDA approved in patients less than 32 years old. Performed at [facility], [address] [place], [place] [address]     Studies/Results: CT ABDOMEN PELVIS W CONTRAST Result Date: 10/08/2023 CLINICAL DATA:  Concern for bowel obstruction. History of sickle cell. EXAM: CT ABDOMEN AND PELVIS WITH CONTRAST TECHNIQUE: Multidetector CT imaging of the abdomen and pelvis was performed using the standard protocol following bolus administration of intravenous contrast. RADIATION DOSE REDUCTION: This exam was performed according to the departmental dose-optimization program which includes automated exposure control, adjustment of the mA and/or kV according to patient size and/or use of iterative reconstruction technique. CONTRAST:  OMNIPAQUE  IOHEXOL  300 MG/ML  SOLN COMPARISON:  CT abdomen/pelvis dated 07/11/2017. FINDINGS: Lower chest: Mild bibasilar scarring/atelectasis. Patchy areas of ground-glass tree-in-bud nodularity in the right lower lobe may be secondary to an infectious/inflammatory process. Hepatobiliary: Stable subcentimeter focal hypodensity at the right hepatic dome, most compatible with a benign etiology such as a small cyst or hemangioma, for which no follow-up imaging is recommended. No suspicious focal hepatic lesion. Status post cholecystectomy. No biliary dilatation. Pancreas: Unremarkable. No pancreatic ductal dilatation or surrounding inflammatory changes. Spleen: Progressive auto infarction of the spleen. Adrenals/Urinary Tract: Adrenal glands are unremarkable. Right pelvic kidney and left kidney are unremarkable. No evidence of urolithiasis or hydronephrosis. Bladder is unremarkable. Stomach/Bowel: No obstruction. Stomach is within normal limits.  Appendix appears normal. The ascending, transverse, and descending colon are collapsed, limiting detailed evaluation, with wall thickening and trace adjacent fluid at the right hepatic flexure, which may be secondary to a nonspecific colitis. Vascular/Lymphatic: Abdominal aorta is normal in caliber. No enlarged abdominal or pelvic lymph nodes. Reproductive: Prostate is unremarkable. Other: No abdominopelvic ascites. No intraperitoneal free air. No abdominal wall hernia. Musculoskeletal: No acute osseous abnormality. No aggressive osseous lesion. IMPRESSION: 1. No evidence of bowel obstruction. 2. The ascending, transverse, and descending colon are collapsed, limiting detailed evaluation, with wall thickening and trace adjacent fluid at the right hepatic flexure, which may be secondary to a nonspecific colitis. 3. Progressive auto infarction of the spleen. 4. Patchy areas of ground-glass tree-in-bud nodularity in the right lower lobe may be secondary to an infectious/inflammatory process. Electronically Signed   By: Harrietta Sherry M.D.   On: 10/08/2023 16:40     Medications: Scheduled Meds:  azithromycin   500 mg Oral Daily   DULoxetine   30 mg Oral Daily   enoxaparin  (LOVENOX ) injection  40 mg Subcutaneous Q24H   gabapentin   300 mg Oral BID   metoprolol  succinate  50 mg Oral Daily   montelukast   10  mg Oral QHS   pantoprazole   40 mg Oral Daily   pneumococcal 20-valent conjugate vaccine  0.5 mL Intramuscular Tomorrow-1000   polyethylene glycol  17 g Oral BID   senna-docusate  3 tablet Oral QHS   SMOG  400 mL Rectal Once   Continuous Infusions:  cefTRIAXone  (ROCEPHIN )  IV 2 g (10/10/23 1202)    PRN Meds:.albuterol , benzonatate , ondansetron  **OR** ondansetron  (ZOFRAN ) IV, oxyCODONE   Consultants: None  Procedures: None  Antibiotics: Cefepime   vancomycin   Assessment/Plan: Principal Problem:   Pneumonia Active Problems:   CAP (community acquired pneumonia)   Abdominal pain    Sickle-cell disease with pain (HCC)   Sickle cell anemia with pain (HCC)  Hb Sickle Cell Disease with Pain crisis: Continue IV fluid at KVO, Toradol  15 mg Q 6 H for a total of 5 days, continue oral home pain medications as ordered. Monitor vitals very closely, Re-evaluate pain scale regularly, 2 L of Oxygen by . Patient encouraged to ambulate on the hallway today.  Pneumonia: Patchy areas of ground-glass tree-in-bud nodularity in the right       lower lobe may be secondary to an infectious/inflammatory process on CT. Continue antibiotics as prescribed.  Leukocytosis: slightly elevated but improving continue antibiotic from admission for pneumonia infection. Will continue to monitor daily labs. Anemia of Chronic Disease: Hemoglobin is within patients baseline, there is no clinical indication for blood transfusion at this time.  Will continue to monitor closely and transfuse as appropriate. Chronic pain Syndrome: Continue oral home pain medications as ordered. Essential hypertension: Blood pressure is controlled.  Continue to monitor Cough: Continue Tessalon  pearls as ordered   Code Status: Full Code Family Communication: N/A Disposition Plan: Ready for discharge in the a.m. Homer CHRISTELLA Cover NP  If 7PM-7AM, please contact night-coverage.  10/09/2023, 1:53 PM  LOS: 2 days

## 2023-10-10 NOTE — Plan of Care (Signed)

## 2023-10-11 NOTE — Plan of Care (Signed)
  Problem: Clinical Measurements: Goal: Will remain free from infection Outcome: Progressing   Problem: Elimination: Goal: Will not experience complications related to bowel motility Outcome: Progressing   Problem: Pain Managment: Goal: General experience of comfort will improve and/or be controlled Outcome: Progressing

## 2023-10-11 NOTE — Plan of Care (Signed)

## 2023-10-11 NOTE — Progress Notes (Signed)
 Patient ID: Arvell R Daughtridge, male   DOB: [birth year], 48 y.o.   MRN: [id number] Subjective: Jariel R Nulty is a 48 y.o. male with medical history significant of sickle cell disease, mild intermittent asthma, GERD, hypertension, cocaine  abuse, ongoing smoker presents to the emergency room with constipation, Nausea, bilateral thigh pain, was admitted for sickle cell pain crisis.    Patient reports pain of 6/10 this morning when coughing. His goal is a pain of less than 6/10.  He denies any fever, chest pain, shortness of breath, nausea, vomiting or diarrhea.  No urinary symptoms. Cough is gradually improving with tessalon  pearls.   Objective:  Vital signs in last 24 hours:  Vitals:   10/10/23 2001 10/11/23 0538 10/11/23 0954 10/11/23 0955  BP: (!) 153/97 (!) 140/98 (!) 141/90 (!) 141/90  Pulse: 63 (!) 56 72 72  Resp: 18 18    Temp: 99.1 F (37.3 C) 98.7 F (37.1 C) 98.7 F (37.1 C)   TempSrc: Oral Oral Oral   SpO2: 97% 99% 95%   Weight:      Height:        Intake/Output from previous day:   Intake/Output Summary (Last 24 hours) at 10/11/2023 1512 Last data filed at 10/11/2023 1352 Gross per 24 hour  Intake 460 ml  Output --  Net 460 ml    Physical Exam: General: Alert, awake, oriented x3, in no acute distress.  HEENT: [place]/AT PEERL, EOMI Neck: Trachea midline,  no masses, no thyromegal,y no JVD, no carotid bruit OROPHARYNX:  Moist, No exudate/ erythema/lesions.  Heart: Regular rate and rhythm, without murmurs, rubs, gallops, PMI non-displaced, no heaves or thrills on palpation.  Lungs:  Clear to auscultation, no wheezing or rhonchi noted. No increased vocal fremitus resonant to percussion. Mild Cough Abdomen: Soft, nontender, nondistended, positive bowel sounds, no masses no hepatosplenomegaly noted..  Neuro: No focal neurological deficits noted cranial nerves II through XII grossly intact. DTRs 2+ bilaterally upper and lower extremities. Strength 5 out of 5 in bilateral upper  and lower extremities. Musculoskeletal:Generalize body tenderness Psychiatric: Patient alert and oriented x3, good insight and cognition, good recent to remote recall. Lymph node survey: No cervical axillary or inguinal lymphadenopathy noted.  Lab Results:  Basic Metabolic Panel:    Component Value Date/Time   NA 137 10/09/2023 0426   NA 135 11/09/2022 1416   K 4.0 10/09/2023 0426   CL 106 10/09/2023 0426   CO2 24 10/09/2023 0426   BUN 12 10/09/2023 0426   BUN 13 11/09/2022 1416   CREATININE 0.95 10/09/2023 0426   GLUCOSE 93 10/09/2023 0426   CALCIUM 10.4 (H) 10/09/2023 0426   CBC:    Component Value Date/Time   WBC 12.8 (H) 10/09/2023 0426   HGB 10.2 (L) 10/09/2023 0426   HGB 14.4 11/09/2022 1416   HCT 28.4 (L) 10/09/2023 0426   HCT 41.0 11/09/2022 1416   PLT 429 (H) 10/09/2023 0426   PLT 303 11/09/2022 1416   MCV 78.5 (L) 10/09/2023 0426   MCV 88 11/09/2022 1416   NEUTROABS 7.0 10/02/2023 1931   NEUTROABS 6.7 11/09/2022 1416   LYMPHSABS 2.6 10/02/2023 1931   LYMPHSABS 4.2 (H) 11/09/2022 1416   MONOABS 1.4 (H) 10/02/2023 1931   EOSABS 0.6 (H) 10/02/2023 1931   EOSABS 0.4 11/09/2022 1416   BASOSABS 0.1 10/02/2023 1931   BASOSABS 0.1 11/09/2022 1416    Recent Results (from the past 240 hours)  Resp panel by RT-PCR (RSV, Flu A&B, Covid) Anterior Nasal Swab  Status: None   Collection Time: 10/02/23  7:42 PM   Specimen: Anterior Nasal Swab  Result Value Ref Range Status   SARS Coronavirus 2 by RT PCR NEGATIVE NEGATIVE Final    Comment: (NOTE) SARS-CoV-2 target nucleic acids are NOT DETECTED.  The SARS-CoV-2 RNA is generally detectable in upper respiratory specimens during the acute phase of infection. The lowest concentration of SARS-CoV-2 viral copies this assay can detect is 138 copies/mL. A negative result does not preclude SARS-Cov-2 infection and should not be used as the sole basis for treatment or other patient management decisions. A negative result  may occur with  improper specimen collection/handling, submission of specimen other than nasopharyngeal swab, presence of viral mutation(s) within the areas targeted by this assay, and inadequate number of viral copies(<138 copies/mL). A negative result must be combined with clinical observations, patient history, and epidemiological information. The expected result is Negative.  Fact Sheet for Patients:  BloggerCourse.com  Fact Sheet for Healthcare Providers:  SeriousBroker.it  This test is no t yet approved or cleared by the [place]  FDA and  has been authorized for detection and/or diagnosis of SARS-CoV-2 by FDA under an Emergency Use Authorization (EUA). This EUA will remain  in effect (meaning this test can be used) for the duration of the COVID-19 declaration under Section 564(b)(1) of the Act, 21 U.S.C.section 360bbb-3(b)(1), unless the authorization is terminated  or revoked sooner.       Influenza A by PCR NEGATIVE NEGATIVE Final   Influenza B by PCR NEGATIVE NEGATIVE Final    Comment: (NOTE) The Xpert Xpress SARS-CoV-2/FLU/RSV plus assay is intended as an aid in the diagnosis of influenza from Nasopharyngeal swab specimens and should not be used as a sole basis for treatment. Nasal washings and aspirates are unacceptable for Xpert Xpress SARS-CoV-2/FLU/RSV testing.  Fact Sheet for Patients: BloggerCourse.com  Fact Sheet for Healthcare Providers: SeriousBroker.it  This test is not yet approved or cleared by the [place]  FDA and has been authorized for detection and/or diagnosis of SARS-CoV-2 by FDA under an Emergency Use Authorization (EUA). This EUA will remain in effect (meaning this test can be used) for the duration of the COVID-19 declaration under Section 564(b)(1) of the Act, 21 U.S.C. section 360bbb-3(b)(1), unless the authorization is terminated  or revoked.     Resp Syncytial Virus by PCR NEGATIVE NEGATIVE Final    Comment: (NOTE) Fact Sheet for Patients: BloggerCourse.com  Fact Sheet for Healthcare Providers: SeriousBroker.it  This test is not yet approved or cleared by the [place]  FDA and has been authorized for detection and/or diagnosis of SARS-CoV-2 by FDA under an Emergency Use Authorization (EUA). This EUA will remain in effect (meaning this test can be used) for the duration of the COVID-19 declaration under Section 564(b)(1) of the Act, 21 U.S.C. section 360bbb-3(b)(1), unless the authorization is terminated or revoked.  Performed at [facility], [address] [place], [place] [address]   Respiratory (~20 pathogens) panel by PCR     Status: Abnormal   Collection Time: 10/08/23  6:33 PM   Specimen: Nasopharyngeal Swab; Respiratory  Result Value Ref Range Status   Adenovirus NOT DETECTED NOT DETECTED Final   Coronavirus 229E NOT DETECTED NOT DETECTED Final    Comment: (NOTE) The Coronavirus on the Respiratory Panel, DOES NOT test for the novel  Coronavirus (2019 nCoV)    Coronavirus HKU1 NOT DETECTED NOT DETECTED Final   Coronavirus NL63 NOT DETECTED NOT DETECTED Final   Coronavirus OC43 NOT  DETECTED NOT DETECTED Final   Metapneumovirus NOT DETECTED NOT DETECTED Final   Rhinovirus / Enterovirus DETECTED (A) NOT DETECTED Final   Influenza A NOT DETECTED NOT DETECTED Final   Influenza B NOT DETECTED NOT DETECTED Final   Parainfluenza Virus 1 NOT DETECTED NOT DETECTED Final   Parainfluenza Virus 2 NOT DETECTED NOT DETECTED Final   Parainfluenza Virus 3 NOT DETECTED NOT DETECTED Final   Parainfluenza Virus 4 NOT DETECTED NOT DETECTED Final   Respiratory Syncytial Virus NOT DETECTED NOT DETECTED Final   Bordetella pertussis NOT DETECTED NOT DETECTED Final   Bordetella Parapertussis NOT DETECTED NOT DETECTED Final   Chlamydophila  pneumoniae NOT DETECTED NOT DETECTED Final   Mycoplasma pneumoniae NOT DETECTED NOT DETECTED Final    Comment: Performed at [facility], [address] [place], [place] [address]  Resp panel by RT-PCR (RSV, Flu A&B, Covid) Nasopharyngeal Swab     Status: None   Collection Time: 10/08/23  6:33 PM   Specimen: Nasopharyngeal Swab; Nasal Swab  Result Value Ref Range Status   SARS Coronavirus 2 by RT PCR NEGATIVE NEGATIVE Final    Comment: (NOTE) SARS-CoV-2 target nucleic acids are NOT DETECTED.  The SARS-CoV-2 RNA is generally detectable in upper respiratory specimens during the acute phase of infection. The lowest concentration of SARS-CoV-2 viral copies this assay can detect is 138 copies/mL. A negative result does not preclude SARS-Cov-2 infection and should not be used as the sole basis for treatment or other patient management decisions. A negative result may occur with  improper specimen collection/handling, submission of specimen other than nasopharyngeal swab, presence of viral mutation(s) within the areas targeted by this assay, and inadequate number of viral copies(<138 copies/mL). A negative result must be combined with clinical observations, patient history, and epidemiological information. The expected result is Negative.  Fact Sheet for Patients:  BloggerCourse.com  Fact Sheet for Healthcare Providers:  SeriousBroker.it  This test is no t yet approved or cleared by the [place]  FDA and  has been authorized for detection and/or diagnosis of SARS-CoV-2 by FDA under an Emergency Use Authorization (EUA). This EUA will remain  in effect (meaning this test can be used) for the duration of the COVID-19 declaration under Section 564(b)(1) of the Act, 21 U.S.C.section 360bbb-3(b)(1), unless the authorization is terminated  or revoked sooner.       Influenza A by PCR NEGATIVE NEGATIVE Final   Influenza B by PCR  NEGATIVE NEGATIVE Final    Comment: (NOTE) The Xpert Xpress SARS-CoV-2/FLU/RSV plus assay is intended as an aid in the diagnosis of influenza from Nasopharyngeal swab specimens and should not be used as a sole basis for treatment. Nasal washings and aspirates are unacceptable for Xpert Xpress SARS-CoV-2/FLU/RSV testing.  Fact Sheet for Patients: BloggerCourse.com  Fact Sheet for Healthcare Providers: SeriousBroker.it  This test is not yet approved or cleared by the [place]  FDA and has been authorized for detection and/or diagnosis of SARS-CoV-2 by FDA under an Emergency Use Authorization (EUA). This EUA will remain in effect (meaning this test can be used) for the duration of the COVID-19 declaration under Section 564(b)(1) of the Act, 21 U.S.C. section 360bbb-3(b)(1), unless the authorization is terminated or revoked.     Resp Syncytial Virus by PCR NEGATIVE NEGATIVE Final    Comment: (NOTE) Fact Sheet for Patients: BloggerCourse.com  Fact Sheet for Healthcare Providers: SeriousBroker.it  This test is not yet approved or cleared by the [place]  FDA and has been authorized for  detection and/or diagnosis of SARS-CoV-2 by FDA under an Emergency Use Authorization (EUA). This EUA will remain in effect (meaning this test can be used) for the duration of the COVID-19 declaration under Section 564(b)(1) of the Act, 21 U.S.C. section 360bbb-3(b)(1), unless the authorization is terminated or revoked.  Performed at [facility], [address] [place], [place] [address]   MRSA Next Gen by PCR, Nasal     Status: None   Collection Time: 10/08/23  6:33 PM   Specimen: Nasopharyngeal Swab; Nasal Swab  Result Value Ref Range Status   MRSA by PCR Next Gen NOT DETECTED NOT DETECTED Final    Comment: (NOTE) The GeneXpert MRSA Assay (FDA approved for NASAL  specimens only), is one component of a comprehensive MRSA colonization surveillance program. It is not intended to diagnose MRSA infection nor to guide or monitor treatment for MRSA infections. Test performance is not FDA approved in patients less than 8 years old. Performed at [facility], [address], [place], [place] [address]     Studies/Results: No results found.    Medications: Scheduled Meds:  azithromycin   500 mg Oral Daily   DULoxetine   30 mg Oral Daily   enoxaparin  (LOVENOX ) injection  40 mg Subcutaneous Q24H   gabapentin   300 mg Oral BID   metoprolol  succinate  50 mg Oral Daily   montelukast   10 mg Oral QHS   pantoprazole   40 mg Oral Daily   polyethylene glycol  17 g Oral BID   senna-docusate  3 tablet Oral QHS   SMOG  400 mL Rectal Once   Continuous Infusions:  cefTRIAXone  (ROCEPHIN )  IV Stopped (10/11/23 1232)    PRN Meds:.albuterol , benzonatate , ondansetron  **OR** ondansetron  (ZOFRAN ) IV, oxyCODONE   Consultants: None  Procedures: None  Antibiotics: Cefepime   vancomycin   Assessment/Plan: Principal Problem:   Pneumonia Active Problems:   CAP (community acquired pneumonia)   Abdominal pain   Sickle-cell disease with pain (HCC)   Sickle cell anemia with pain (HCC)  Hb Sickle Cell Disease with Pain crisis: Continue IV fluid at KVO, Toradol  15 mg Q 6 H for a total of 5 days, continue oral home pain medications as ordered. Monitor vitals very closely, Re-evaluate pain scale regularly, 2 L of Oxygen by . Patient encouraged to ambulate on the hallway today.  Pneumonia: Patchy areas of ground-glass tree-in-bud nodularity in the right       lower lobe may be secondary to an infectious/inflammatory process on CT. Continue antibiotics as prescribed.  Leukocytosis: slightly elevated but improving continue antibiotic from admission for pneumonia infection. Will continue to monitor daily labs. Anemia of Chronic Disease: Hemoglobin is  within patients baseline, there is no clinical indication for blood transfusion at this time.  Will continue to monitor closely and transfuse as appropriate. Chronic pain Syndrome: Continue oral home pain medications as ordered. Essential hypertension: Blood pressure is controlled.  Continue to monitor Cough: Continue Tessalon  pearls as ordered   Code Status: Full Code Family Communication: N/A Disposition Plan: Ready for discharge in the a.m. Homer CHRISTELLA Cover NP  If 7PM-7AM, please contact night-coverage.  10/09/2023, 3:12 PM  LOS: 3 days

## 2023-10-12 LAB — CBC
HCT: 29.5 % — ABNORMAL LOW (ref 39.0–52.0)
Hemoglobin: 10.7 g/dL — ABNORMAL LOW (ref 13.0–17.0)
MCH: 29 pg (ref 26.0–34.0)
MCHC: 36.3 g/dL — ABNORMAL HIGH (ref 30.0–36.0)
MCV: 79.9 fL — ABNORMAL LOW (ref 80.0–100.0)
Platelets: 466 K/uL — ABNORMAL HIGH (ref 150–400)
RBC: 3.69 MIL/uL — ABNORMAL LOW (ref 4.22–5.81)
RDW: 15.3 % (ref 11.5–15.5)
WBC: 15 K/uL — ABNORMAL HIGH (ref 4.0–10.5)
nRBC: 2.9 % — ABNORMAL HIGH (ref 0.0–0.2)

## 2023-10-12 MED ORDER — AMOXICILLIN-POT CLAVULANATE 875-125 MG PO TABS: 1.0000 | ORAL_TABLET | Freq: Two times a day (BID) | ORAL | 0 refills | Status: AC

## 2023-10-12 NOTE — Discharge Summary (Addendum)
 Physician Discharge Summary  Dean Webb FMW:[id number] DOB: 1976-01-25 DOA: 10/08/2023  PCP: Dean Bascom RAMAN, NP  Admit date: 10/08/2023  Discharge date: 10/12/2023  Discharge Diagnoses:  Principal Problem:   Pneumonia Active Problems:   CAP (community acquired pneumonia)   Abdominal pain   Sickle-cell disease with pain (HCC)   Sickle cell anemia with pain [facility])   Discharge Condition: Stable  Disposition:  Pt is discharged home in good condition and is to follow up with Dean Bascom RAMAN, NP this week to have labs evaluated. Dean Webb is instructed to increase activity slowly and balance with rest for the next few days, and use prescribed medication to complete treatment of pain  Diet: Regular Wt Readings from Last 3 Encounters:  10/10/23 90.7 kg  10/02/23 90.7 kg  09/20/23 90.7 kg    History of present illness:  Dean Webb is a 48 y.o. male with medical history significant of sickle cell disease, chronic pain syndrome, hypertension, cocaine  abuse, tobacco abuse, GERD, hypertension, asthma mild, anemia, chronic pain syndrome was admitted to the hospital recently on 10/02/2023 and discharged on the 24th after sickle cell pain crisis. He  presents to the emergency department with complaints of not being able to have a bowel movement for the last 6 days.  He complains of abdominal pain and nausea.  No vomiting.  P.o. intake has been poor due to abdominal pain nausea and constipation. He has been coughing up greenish phlegm for past few days with some shortness of breath however he remains on room air in the ER and saturating well on room air.  Denies chest pain.  No urinary complaints.   ED course: Patient was treated with IV fluid, IV pain medication. In the ED CT of the abdomen and pelvis shows no evidence of bowel obstruction,The ascending, transverse, and descending colon are collapsed, limiting detailed evaluation, with wall thickening and trace adjacent fluid  at the right hepatic flexure, which may be secondary to a nonspecific colitis. Progressive auto infarction of the spleen.Patchy areas of ground-glass tree-in-bud nodularity in the right lower lobe may be secondary to an infectious/inflammatory process Patient's pain was not well controlled and admitted for ongoing sickle cell pain and constipation.   Hospital Course:  Patient was admitted for sickle cell pain crisis and managed appropriately with IVF, IV Toradol , and oral pain medication as well as other adjunct therapies per sickle cell pain management protocols. Patient is reporting pain at baseline, and has had a good bowel movement. Patient will complete oral antibiotics at home.  Patient was therefore discharged home today in a hemodynamically stable condition.   Dean Webb will follow-up with PCP within 1 week of this discharge. Dean Webb was counseled extensively about nonpharmacologic means of pain management, patient verbalized understanding and was appreciative of  the care received during this admission.   We discussed the need for good hydration, monitoring of hydration status, avoidance of heat, cold, stress, and infection triggers. We discussed the need to be adherent with taking other home medications. Patient was reminded of the need to seek medical attention immediately if any symptom of bleeding, anemia, or infection occurs.  Discharge Exam: Vitals:   10/12/23 0442 10/12/23 0658  BP:  (!) 158/102  Pulse: 82 65  Resp: 18 18  Temp: 98.4 F (36.9 C)   SpO2: 100%    Vitals:   10/11/23 0955 10/11/23 1955 10/12/23 0442 10/12/23 0658  BP: (!) 141/90 (!) 153/93  (!) 158/102  Pulse: 72 ROLLEN)  56 82 65  Resp:  18 18 18   Temp:  98.3 F (36.8 C) 98.4 F (36.9 C)   TempSrc:   Oral   SpO2:  100% 100%   Weight:      Height:        General appearance : Awake, alert, not in any distress. Speech Clear. Not toxic looking HEENT: Atraumatic and Normocephalic, pupils equally reactive to light  and accomodation Neck: Supple, no JVD. No cervical lymphadenopathy.  Chest: Good air entry bilaterally, no added sounds  CVS: S1 S2 regular, no murmurs.  Abdomen: Bowel sounds present, Non tender and not distended with no gaurding, rigidity or rebound. Extremities: B/L Lower Ext shows no edema, both legs are warm to touch Neurology: Awake alert, and oriented X 3, CN II-XII intact, Non focal Skin: No Rash  Discharge Instructions  Discharge Instructions     Call MD for:  severe uncontrolled pain   Complete by: As directed    Call MD for:  temperature >100.4   Complete by: As directed    Diet - low sodium heart healthy   Complete by: As directed    Increase activity slowly   Complete by: As directed       Allergies as of 10/12/2023   No Known Allergies      Medication List     TAKE these medications    albuterol  108 (90 Base) MCG/ACT inhaler Commonly known as: VENTOLIN  HFA TAKE 2 PUFFS BY MOUTH EVERY 6 HOURS AS NEEDED FOR WHEEZE OR SHORTNESS OF BREATH What changed: See the new instructions.   amLODipine  10 MG tablet Commonly known as: NORVASC  Take 1 tablet (10 mg total) by mouth daily.   amoxicillin -clavulanate 875-125 MG tablet Commonly known as: AUGMENTIN  Take 1 tablet by mouth 2 (two) times daily.   cetirizine  10 MG tablet Commonly known as: ZYRTEC  TAKE 1 TABLET BY MOUTH EVERY DAY AS NEEDED FOR ALLERGY   cloNIDine  0.1 MG tablet Commonly known as: CATAPRES  TAKE 1 TABLET BY MOUTH 2 TIMES DAILY. What changed: when to take this   dextromethorphan -guaiFENesin  30-600 MG 12hr tablet Commonly known as: MUCINEX  DM Take 1 tablet by mouth 2 (two) times daily.   DULoxetine  30 MG capsule Commonly known as: Cymbalta  Take 1 capsule (30 mg total) by mouth daily.   gabapentin  300 MG capsule Commonly known as: NEURONTIN  Take 1 capsule (300 mg total) by mouth 2 (two) times daily.   losartan -hydrochlorothiazide  100-25 MG tablet Commonly known as: HYZAAR Take 1 tablet  by mouth daily.   metoprolol  succinate 50 MG 24 hr tablet Commonly known as: TOPROL -XL Take 1 tablet (50 mg total) by mouth daily. Take with or immediately following a meal.   montelukast  10 MG tablet Commonly known as: SINGULAIR  TAKE 1 TABLET BY MOUTH EVERYDAY AT BEDTIME   nicotine  21 mg/24hr patch Commonly known as: NICODERM CQ  - dosed in mg/24 hours Place 1 patch (21 mg total) onto the skin daily.   Oxycodone  HCl 10 MG Tabs Take 1 tablet (10 mg total) by mouth every 6 (six) hours as needed for moderate pain (pain score 4-6).   pantoprazole  40 MG tablet Commonly known as: PROTONIX  Take 1 tablet (40 mg total) by mouth daily.   tiZANidine  4 MG tablet Commonly known as: Zanaflex  Take 1 tablet (4 mg total) by mouth every 6 (six) hours as needed for muscle spasms.        The results of significant diagnostics from this hospitalization (including imaging, microbiology, ancillary and laboratory)  are listed below for reference.    Significant Diagnostic Studies: CT ABDOMEN PELVIS W CONTRAST Result Date: 10/08/2023 CLINICAL DATA:  Concern for bowel obstruction. History of sickle cell. EXAM: CT ABDOMEN AND PELVIS WITH CONTRAST TECHNIQUE: Multidetector CT imaging of the abdomen and pelvis was performed using the standard protocol following bolus administration of intravenous contrast. RADIATION DOSE REDUCTION: This exam was performed according to the departmental dose-optimization program which includes automated exposure control, adjustment of the mA and/or kV according to patient size and/or use of iterative reconstruction technique. CONTRAST:  OMNIPAQUE  IOHEXOL  300 MG/ML  SOLN COMPARISON:  CT abdomen/pelvis dated 07/11/2017. FINDINGS: Lower chest: Mild bibasilar scarring/atelectasis. Patchy areas of ground-glass tree-in-bud nodularity in the right lower lobe may be secondary to an infectious/inflammatory process. Hepatobiliary: Stable subcentimeter focal hypodensity at the right  hepatic dome, most compatible with a benign etiology such as a small cyst or hemangioma, for which no follow-up imaging is recommended. No suspicious focal hepatic lesion. Status post cholecystectomy. No biliary dilatation. Pancreas: Unremarkable. No pancreatic ductal dilatation or surrounding inflammatory changes. Spleen: Progressive auto infarction of the spleen. Adrenals/Urinary Tract: Adrenal glands are unremarkable. Right pelvic kidney and left kidney are unremarkable. No evidence of urolithiasis or hydronephrosis. Bladder is unremarkable. Stomach/Bowel: No obstruction. Stomach is within normal limits. Appendix appears normal. The ascending, transverse, and descending colon are collapsed, limiting detailed evaluation, with wall thickening and trace adjacent fluid at the right hepatic flexure, which may be secondary to a nonspecific colitis. Vascular/Lymphatic: Abdominal aorta is normal in caliber. No enlarged abdominal or pelvic lymph nodes. Reproductive: Prostate is unremarkable. Other: No abdominopelvic ascites. No intraperitoneal free air. No abdominal wall hernia. Musculoskeletal: No acute osseous abnormality. No aggressive osseous lesion. IMPRESSION: 1. No evidence of bowel obstruction. 2. The ascending, transverse, and descending colon are collapsed, limiting detailed evaluation, with wall thickening and trace adjacent fluid at the right hepatic flexure, which may be secondary to a nonspecific colitis. 3. Progressive auto infarction of the spleen. 4. Patchy areas of ground-glass tree-in-bud nodularity in the right lower lobe may be secondary to an infectious/inflammatory process. Electronically Signed   By: Harrietta Sherry M.D.   On: 10/08/2023 16:40   DG Chest 2 View Result Date: 10/02/2023 CLINICAL DATA:  Chest pain EXAM: CHEST - 2 VIEW COMPARISON:  04/01/2023 FINDINGS: Heart and mediastinal contours are within normal limits. No focal opacities or effusions. No acute bony abnormality. IMPRESSION: No  active cardiopulmonary disease. Electronically Signed   By: Franky Crease M.D.   On: 10/02/2023 19:31    Microbiology: Recent Results (from the past 240 hours)  Resp panel by RT-PCR (RSV, Flu A&B, Covid) Anterior Nasal Swab     Status: None   Collection Time: 10/02/23  7:42 PM   Specimen: Anterior Nasal Swab  Result Value Ref Range Status   SARS Coronavirus 2 by RT PCR NEGATIVE NEGATIVE Final    Comment: (NOTE) SARS-CoV-2 target nucleic acids are NOT DETECTED.  The SARS-CoV-2 RNA is generally detectable in upper respiratory specimens during the acute phase of infection. The lowest concentration of SARS-CoV-2 viral copies this assay can detect is 138 copies/mL. A negative result does not preclude SARS-Cov-2 infection and should not be used as the sole basis for treatment or other patient management decisions. A negative result may occur with  improper specimen collection/handling, submission of specimen other than nasopharyngeal swab, presence of viral mutation(s) within the areas targeted by this assay, and inadequate number of viral copies(<138 copies/mL). A negative result must be  combined with clinical observations, patient history, and epidemiological information. The expected result is Negative.  Fact Sheet for Patients:  BloggerCourse.com  Fact Sheet for Healthcare Providers:  SeriousBroker.it  This test is no t yet approved or cleared by the [place]  FDA and  has been authorized for detection and/or diagnosis of SARS-CoV-2 by FDA under an Emergency Use Authorization (EUA). This EUA will remain  in effect (meaning this test can be used) for the duration of the COVID-19 declaration under Section 564(b)(1) of the Act, 21 U.S.C.section 360bbb-3(b)(1), unless the authorization is terminated  or revoked sooner.       Influenza A by PCR NEGATIVE NEGATIVE Final   Influenza B by PCR NEGATIVE NEGATIVE Final    Comment:  (NOTE) The Xpert Xpress SARS-CoV-2/FLU/RSV plus assay is intended as an aid in the diagnosis of influenza from Nasopharyngeal swab specimens and should not be used as a sole basis for treatment. Nasal washings and aspirates are unacceptable for Xpert Xpress SARS-CoV-2/FLU/RSV testing.  Fact Sheet for Patients: BloggerCourse.com  Fact Sheet for Healthcare Providers: SeriousBroker.it  This test is not yet approved or cleared by the [place]  FDA and has been authorized for detection and/or diagnosis of SARS-CoV-2 by FDA under an Emergency Use Authorization (EUA). This EUA will remain in effect (meaning this test can be used) for the duration of the COVID-19 declaration under Section 564(b)(1) of the Act, 21 U.S.C. section 360bbb-3(b)(1), unless the authorization is terminated or revoked.     Resp Syncytial Virus by PCR NEGATIVE NEGATIVE Final    Comment: (NOTE) Fact Sheet for Patients: BloggerCourse.com  Fact Sheet for Healthcare Providers: SeriousBroker.it  This test is not yet approved or cleared by the [place]  FDA and has been authorized for detection and/or diagnosis of SARS-CoV-2 by FDA under an Emergency Use Authorization (EUA). This EUA will remain in effect (meaning this test can be used) for the duration of the COVID-19 declaration under Section 564(b)(1) of the Act, 21 U.S.C. section 360bbb-3(b)(1), unless the authorization is terminated or revoked.  Performed at [facility], [address], [place], [place] [address]   Respiratory (~20 pathogens) panel by PCR     Status: Abnormal   Collection Time: 10/08/23  6:33 PM   Specimen: Nasopharyngeal Swab; Respiratory  Result Value Ref Range Status   Adenovirus NOT DETECTED NOT DETECTED Final   Coronavirus 229E NOT DETECTED NOT DETECTED Final    Comment: (NOTE) The Coronavirus on the  Respiratory Panel, DOES NOT test for the novel  Coronavirus (2019 nCoV)    Coronavirus HKU1 NOT DETECTED NOT DETECTED Final   Coronavirus NL63 NOT DETECTED NOT DETECTED Final   Coronavirus OC43 NOT DETECTED NOT DETECTED Final   Metapneumovirus NOT DETECTED NOT DETECTED Final   Rhinovirus / Enterovirus DETECTED (A) NOT DETECTED Final   Influenza A NOT DETECTED NOT DETECTED Final   Influenza B NOT DETECTED NOT DETECTED Final   Parainfluenza Virus 1 NOT DETECTED NOT DETECTED Final   Parainfluenza Virus 2 NOT DETECTED NOT DETECTED Final   Parainfluenza Virus 3 NOT DETECTED NOT DETECTED Final   Parainfluenza Virus 4 NOT DETECTED NOT DETECTED Final   Respiratory Syncytial Virus NOT DETECTED NOT DETECTED Final   Bordetella pertussis NOT DETECTED NOT DETECTED Final   Bordetella Parapertussis NOT DETECTED NOT DETECTED Final   Chlamydophila pneumoniae NOT DETECTED NOT DETECTED Final   Mycoplasma pneumoniae NOT DETECTED NOT DETECTED Final    Comment: Performed at [facility], [address],  [place], [place] [address]  Resp panel by RT-PCR (RSV, Flu A&B, Covid) Nasopharyngeal Swab     Status: None   Collection Time: 10/08/23  6:33 PM   Specimen: Nasopharyngeal Swab; Nasal Swab  Result Value Ref Range Status   SARS Coronavirus 2 by RT PCR NEGATIVE NEGATIVE Final    Comment: (NOTE) SARS-CoV-2 target nucleic acids are NOT DETECTED.  The SARS-CoV-2 RNA is generally detectable in upper respiratory specimens during the acute phase of infection. The lowest concentration of SARS-CoV-2 viral copies this assay can detect is 138 copies/mL. A negative result does not preclude SARS-Cov-2 infection and should not be used as the sole basis for treatment or other patient management decisions. A negative result may occur with  improper specimen collection/handling, submission of specimen other than nasopharyngeal swab, presence of viral mutation(s) within the areas targeted by this assay, and  inadequate number of viral copies(<138 copies/mL). A negative result must be combined with clinical observations, patient history, and epidemiological information. The expected result is Negative.  Fact Sheet for Patients:  BloggerCourse.com  Fact Sheet for Healthcare Providers:  SeriousBroker.it  This test is no t yet approved or cleared by the [place]  FDA and  has been authorized for detection and/or diagnosis of SARS-CoV-2 by FDA under an Emergency Use Authorization (EUA). This EUA will remain  in effect (meaning this test can be used) for the duration of the COVID-19 declaration under Section 564(b)(1) of the Act, 21 U.S.C.section 360bbb-3(b)(1), unless the authorization is terminated  or revoked sooner.       Influenza A by PCR NEGATIVE NEGATIVE Final   Influenza B by PCR NEGATIVE NEGATIVE Final    Comment: (NOTE) The Xpert Xpress SARS-CoV-2/FLU/RSV plus assay is intended as an aid in the diagnosis of influenza from Nasopharyngeal swab specimens and should not be used as a sole basis for treatment. Nasal washings and aspirates are unacceptable for Xpert Xpress SARS-CoV-2/FLU/RSV testing.  Fact Sheet for Patients: BloggerCourse.com  Fact Sheet for Healthcare Providers: SeriousBroker.it  This test is not yet approved or cleared by the [place]  FDA and has been authorized for detection and/or diagnosis of SARS-CoV-2 by FDA under an Emergency Use Authorization (EUA). This EUA will remain in effect (meaning this test can be used) for the duration of the COVID-19 declaration under Section 564(b)(1) of the Act, 21 U.S.C. section 360bbb-3(b)(1), unless the authorization is terminated or revoked.     Resp Syncytial Virus by PCR NEGATIVE NEGATIVE Final    Comment: (NOTE) Fact Sheet for Patients: BloggerCourse.com  Fact Sheet for Healthcare  Providers: SeriousBroker.it  This test is not yet approved or cleared by the [place]  FDA and has been authorized for detection and/or diagnosis of SARS-CoV-2 by FDA under an Emergency Use Authorization (EUA). This EUA will remain in effect (meaning this test can be used) for the duration of the COVID-19 declaration under Section 564(b)(1) of the Act, 21 U.S.C. section 360bbb-3(b)(1), unless the authorization is terminated or revoked.  Performed at [facility], [address], [place], [place] [address]   MRSA Next Gen by PCR, Nasal     Status: None   Collection Time: 10/08/23  6:33 PM   Specimen: Nasopharyngeal Swab; Nasal Swab  Result Value Ref Range Status   MRSA by PCR Next Gen NOT DETECTED NOT DETECTED Final    Comment: (NOTE) The GeneXpert MRSA Assay (FDA approved for NASAL specimens only), is one component of a comprehensive MRSA colonization surveillance program. It is not intended to  diagnose MRSA infection nor to guide or monitor treatment for MRSA infections. Test performance is not FDA approved in patients less than 6 years old. Performed at [facility], [address] [place], [place] [address]      Labs: Basic Metabolic Panel: Recent Labs  Lab 10/07/23 0639 10/08/23 1437 10/08/23 1833 10/09/23 0426  NA 137 135  --  137  K 5.2* 4.1  --  4.0  CL 105 103  --  106  CO2 22 22  --  24  GLUCOSE 77 105*  --  93  BUN 13 9  --  12  CREATININE 0.97 1.03 1.02 0.95  CALCIUM 10.4* 10.4*  --  10.4*   Liver Function Tests: Recent Labs  Lab 10/07/23 0639 10/09/23 0426  AST 32 20  ALT 27 24  ALKPHOS 88 94  BILITOT 1.3* 1.5*  PROT 7.4 7.1  ALBUMIN 3.8 3.7   Recent Labs  Lab 10/08/23 1437  LIPASE 26   No results for input(s): AMMONIA in the last 168 hours. CBC: Recent Labs  Lab 10/06/23 0809 10/08/23 1437 10/08/23 1833 10/09/23 0426 10/12/23 0436  WBC 10.6* 14.1* 14.9* 12.8*  15.0*  HGB 10.5* 11.2* 10.2* 10.2* 10.7*  HCT 28.3* 30.0* 28.2* 28.4* 29.5*  MCV 79.1* 78.1* 78.3* 78.5* 79.9*  PLT 380 426* 417* 429* 466*   Cardiac Enzymes: No results for input(s): CKTOTAL, CKMB, CKMBINDEX, TROPONINI in the last 168 hours. BNP: Invalid input(s): POCBNP CBG: No results for input(s): GLUCAP in the last 168 hours.  Time coordinating discharge: 50 minutes  Signed:  Homer CHRISTELLA Cover NP    10/12/2023, 1:31 PM

## 2023-10-16 ENCOUNTER — Telehealth: Payer: Self-pay

## 2023-10-16 NOTE — Transitions of Care (Post Inpatient/ED Visit) (Signed)
   10/16/2023  Name: Dean Webb MRN: [id number] DOB: 1976/01/30  Today's TOC FU Call Status: Today's TOC FU Call Status:: Unsuccessful Call (1st Attempt) Unsuccessful Call (1st Attempt) Date: 10/16/23  Attempted to reach the patient regarding the most recent Inpatient/ED visit.  Follow Up Plan: Additional outreach attempts will be made to reach the patient to complete the Transitions of Care (Post Inpatient/ED visit) call.   Arvin Seip RN, BSN, CCM [employer], Population Health Case Manager Phone: [phone]

## 2023-10-17 ENCOUNTER — Telehealth: Payer: Self-pay

## 2023-10-17 NOTE — Transitions of Care (Post Inpatient/ED Visit) (Signed)
   10/17/2023  Name: Dean Webb MRN: [id number] DOB: December 28, 1975  Today's TOC FU Call Status: Today's TOC FU Call Status:: Unsuccessful Call (2nd Attempt) Unsuccessful Call (2nd Attempt) Date: 10/17/23  Attempted to reach the patient regarding the most recent Inpatient/ED visit.  Follow Up Plan: Additional outreach attempts will be made to reach the patient to complete the Transitions of Care (Post Inpatient/ED visit) call.   Shona Prow RN, CCM [place]  VBCI-Population Health RN Care Manager [phone]

## 2023-10-18 ENCOUNTER — Telehealth: Payer: Self-pay

## 2023-10-18 NOTE — Transitions of Care (Post Inpatient/ED Visit) (Signed)
   10/18/2023  Name: ARICK MARENO MRN: [id number] DOB: 16-Sep-1975  Today's TOC FU Call Status: Today's TOC FU Call Status:: Unsuccessful Call (3rd Attempt) Unsuccessful Call (3rd Attempt) Date: 10/18/23  Attempted to reach the patient regarding the most recent Inpatient/ED visit.  Follow Up Plan: No further outreach attempts will be made at this time. We have been unable to contact the patient.  Neomi Laidler J. Haider Hornaday RN, MSN [facility], [facility] Health RN Care Manager Direct Dial: [phone]  Fax: [phone] Website: [email or web address]

## 2023-11-02 ENCOUNTER — Other Ambulatory Visit: Payer: Self-pay | Admitting: Nurse Practitioner

## 2023-11-02 NOTE — Telephone Encounter (Signed)
 Copied from CRM [id number]. Topic: Clinical - Medication Refill >> Nov 02, 2023  2:34 PM Everette C wrote: Medication: oxyCODONE  10 MG TABS  Has the patient contacted their pharmacy? No (Agent: If no, request that the patient contact the pharmacy for the refill. If patient does not wish to contact the pharmacy document the reason why and proceed with request.) (Agent: If yes, when and what did the pharmacy advise?)  This is the patient's preferred pharmacy:  [facility] [phone] [address], [place] - [address] [place] [address] Phone: [phone] Fax: [phone]  Is this the correct pharmacy for this prescription? Yes If no, delete pharmacy and type the correct one.   Has the prescription been filled recently? Yes  Is the patient out of the medication? Yes  Has the patient been seen for an appointment in the last year OR does the patient have an upcoming appointment? Yes  Can we respond through MyChart? No  Agent: Please be advised that Rx refills may take up to 3 business days. We ask that you follow-up with your pharmacy.

## 2023-11-08 ENCOUNTER — Other Ambulatory Visit: Payer: Self-pay | Admitting: Nurse Practitioner

## 2023-11-08 MED ORDER — OXYCODONE HCL 10 MG PO TABS: 10.0000 mg | ORAL_TABLET | Freq: Four times a day (QID) | ORAL | 0 refills | Status: DC | PRN

## 2023-11-08 NOTE — Telephone Encounter (Signed)
 Copied from CRM [id number]. Topic: Clinical - Medication Refill >> Nov 08, 2023 11:42 AM Rosaria BRAVO wrote: Medication:  oxyCODONE  10 MG TABS    Has the patient contacted their pharmacy? Yes (Agent: If no, request that the patient contact the pharmacy for the refill. If patient does not wish to contact the pharmacy document the reason why and proceed with request.) (Agent: If yes, when and what did the pharmacy advise?)  This is the patient's preferred pharmacy:  [facility] [phone] [address],  - [address] [place] [address] Phone: [phone] Fax: [phone]  Is this the correct pharmacy for this prescription? Yes If no, delete pharmacy and type the correct one.   Has the prescription been filled recently? Yes  Is the patient out of the medication? Yes  Has the patient been seen for an appointment in the last year OR does the patient have an upcoming appointment? Yes  Can we respond through MyChart? Yes  Agent: Please be advised that Rx refills may take up to 3 business days. We ask that you follow-up with your pharmacy.

## 2023-11-08 NOTE — Telephone Encounter (Signed)
 Med refill

## 2023-12-10 ENCOUNTER — Other Ambulatory Visit: Payer: Self-pay | Admitting: Nurse Practitioner

## 2023-12-10 NOTE — Telephone Encounter (Signed)
 Copied from CRM [id number]. Topic: Clinical - Medication Refill >> Dec 10, 2023 11:41 AM Darshell M wrote: Medication: Oxycodone  HCl 10 MG TABS   Has the patient contacted their pharmacy? Yes (Agent: If no, request that the patient contact the pharmacy for the refill. If patient does not wish to contact the pharmacy document the reason why and proceed with request.) (Agent: If yes, when and what did the pharmacy advise?)  This is the patient's preferred pharmacy:  [facility] [phone] [address], [place] - [address] [place] [address] Phone: [phone] Fax: [phone]  Is this the correct pharmacy for this prescription? Yes If no, delete pharmacy and type the correct one.   Has the prescription been filled recently? No  Is the patient out of the medication? Yes  Has the patient been seen for an appointment in the last year OR does the patient have an upcoming appointment? Yes  Can we respond through MyChart? Yes  Agent: Please be advised that Rx refills may take up to 3 business days. We ask that you follow-up with your pharmacy.

## 2023-12-11 NOTE — Telephone Encounter (Signed)
 Please advise [facility]

## 2023-12-17 NOTE — Telephone Encounter (Signed)
 Patient called to follow up on medication that was requested on 12/10/2023

## 2023-12-18 NOTE — Telephone Encounter (Signed)
 Please advise [facility]

## 2023-12-20 MED ORDER — OXYCODONE HCL 10 MG PO TABS: 10.0000 mg | ORAL_TABLET | Freq: Four times a day (QID) | ORAL | 0 refills | Status: DC | PRN

## 2023-12-27 ENCOUNTER — Ambulatory Visit: Payer: Self-pay | Admitting: Nurse Practitioner

## 2023-12-31 ENCOUNTER — Ambulatory Visit: Payer: Self-pay | Admitting: Nurse Practitioner

## 2024-02-04 ENCOUNTER — Telehealth: Payer: Self-pay | Admitting: Nurse Practitioner

## 2024-02-04 DIAGNOSIS — I10 Essential (primary) hypertension: Secondary | ICD-10-CM

## 2024-02-04 NOTE — Telephone Encounter (Unsigned)
 Copied from CRM [id number]. Topic: Clinical - Medication Refill >> Feb 04, 2024  2:47 PM Antwanette L wrote: Medication: Oxycodone  HCl 10 MG TABS amLODipine  (NORVASC ) 10 MG tablet   Has the patient contacted their pharmacy? No   This is the patient's preferred pharmacy:  [facility] [phone] [address], [place] - [address] [place] [address] Phone: [phone] Fax: [phone]  Is this the correct pharmacy for this prescription? Yes  Has the prescription been filled recently? Yes. Oxycodone  was last refilled on 12/20/23 by Dr. Jegede Olugbemiga and amlopipine was refilled on 04/17/22 by Bascom Borer  Is the patient out of the medication? Yes  Has the patient been seen for an appointment in the last year OR does the patient have an upcoming appointment? No. The patient has an acute care visit on 10/08/23. The patient has no upcoming appts scheduled   Can we respond through MyChart? No. Patient can be reached at [phone]  Agent: Please be advised that Rx refills may take up to 3 business days. We ask that you follow-up with your pharmacy.

## 2024-02-06 ENCOUNTER — Telehealth: Payer: Self-pay | Admitting: Nurse Practitioner

## 2024-02-06 ENCOUNTER — Other Ambulatory Visit: Payer: Self-pay

## 2024-02-06 ENCOUNTER — Telehealth: Payer: Self-pay

## 2024-02-06 DIAGNOSIS — D5720 Sickle-cell/Hb-C disease without crisis: Secondary | ICD-10-CM

## 2024-02-06 DIAGNOSIS — G894 Chronic pain syndrome: Secondary | ICD-10-CM

## 2024-02-06 DIAGNOSIS — I10 Essential (primary) hypertension: Secondary | ICD-10-CM

## 2024-02-06 MED ORDER — OXYCODONE HCL 10 MG PO TABS: 10.0000 mg | ORAL_TABLET | Freq: Four times a day (QID) | ORAL | 0 refills | Status: DC | PRN

## 2024-02-06 MED ORDER — AMLODIPINE BESYLATE 10 MG PO TABS: 10.0000 mg | ORAL_TABLET | Freq: Every day | ORAL | 1 refills | Status: AC

## 2024-02-06 NOTE — Telephone Encounter (Signed)
 Patient would like a refill on oxycodone  and amlodipine 

## 2024-02-06 NOTE — Telephone Encounter (Signed)
 Copied from CRM #[id number]. Topic: Clinical - Prescription Issue >> Feb 06, 2024  9:31 AM Lonell PEDLAR wrote: Reason for CRM: Patient called and stated that Dr. Jegede typically issues his rx refills. Patient is requesting that provider refill his medication. Please review and advise.

## 2024-02-08 ENCOUNTER — Ambulatory Visit: Payer: MEDICAID | Admitting: Nurse Practitioner

## 2024-02-22 ENCOUNTER — Inpatient Hospital Stay (HOSPITAL_COMMUNITY)
Admission: EM | Admit: 2024-02-22 | Discharge: 2024-02-27 | DRG: 812 | Disposition: A | Discharge status: Home / Self Care | Payer: MEDICAID | Attending: Internal Medicine | Admitting: Internal Medicine

## 2024-02-22 ENCOUNTER — Other Ambulatory Visit: Payer: Self-pay

## 2024-02-22 ENCOUNTER — Emergency Department (HOSPITAL_COMMUNITY): Payer: MEDICAID

## 2024-02-22 DIAGNOSIS — Z79899 Other long term (current) drug therapy: Secondary | ICD-10-CM

## 2024-02-22 DIAGNOSIS — J189 Pneumonia, unspecified organism: Secondary | ICD-10-CM

## 2024-02-22 DIAGNOSIS — J4520 Mild intermittent asthma, uncomplicated: Secondary | ICD-10-CM | POA: Diagnosis present

## 2024-02-22 DIAGNOSIS — R109 Unspecified abdominal pain: Secondary | ICD-10-CM | POA: Diagnosis present

## 2024-02-22 DIAGNOSIS — R079 Chest pain, unspecified: Secondary | ICD-10-CM

## 2024-02-22 DIAGNOSIS — D5700 Hb-SS disease with crisis, unspecified: Principal | ICD-10-CM | POA: Diagnosis present

## 2024-02-22 DIAGNOSIS — G4733 Obstructive sleep apnea (adult) (pediatric): Secondary | ICD-10-CM | POA: Diagnosis present

## 2024-02-22 DIAGNOSIS — Z832 Family history of diseases of the blood and blood-forming organs and certain disorders involving the immune mechanism: Secondary | ICD-10-CM

## 2024-02-22 DIAGNOSIS — R112 Nausea with vomiting, unspecified: Secondary | ICD-10-CM | POA: Insufficient documentation

## 2024-02-22 DIAGNOSIS — N179 Acute kidney failure, unspecified: Secondary | ICD-10-CM | POA: Diagnosis present

## 2024-02-22 DIAGNOSIS — I10 Essential (primary) hypertension: Secondary | ICD-10-CM | POA: Diagnosis present

## 2024-02-22 DIAGNOSIS — Z8249 Family history of ischemic heart disease and other diseases of the circulatory system: Secondary | ICD-10-CM

## 2024-02-22 DIAGNOSIS — D638 Anemia in other chronic diseases classified elsewhere: Secondary | ICD-10-CM | POA: Diagnosis present

## 2024-02-22 DIAGNOSIS — G894 Chronic pain syndrome: Secondary | ICD-10-CM | POA: Diagnosis present

## 2024-02-22 DIAGNOSIS — Z833 Family history of diabetes mellitus: Secondary | ICD-10-CM

## 2024-02-22 DIAGNOSIS — D72829 Elevated white blood cell count, unspecified: Secondary | ICD-10-CM | POA: Diagnosis present

## 2024-02-22 DIAGNOSIS — Z825 Family history of asthma and other chronic lower respiratory diseases: Secondary | ICD-10-CM

## 2024-02-22 DIAGNOSIS — K219 Gastro-esophageal reflux disease without esophagitis: Secondary | ICD-10-CM | POA: Diagnosis present

## 2024-02-22 DIAGNOSIS — D57219 Sickle-cell/Hb-C disease with crisis, unspecified: Principal | ICD-10-CM | POA: Diagnosis present

## 2024-02-22 DIAGNOSIS — Z87891 Personal history of nicotine dependence: Secondary | ICD-10-CM

## 2024-02-22 LAB — CBC WITH DIFFERENTIAL/PLATELET
Abs Immature Granulocytes: 0.09 K/uL — ABNORMAL HIGH (ref 0.00–0.07)
Basophils Absolute: 0.1 K/uL (ref 0.0–0.1)
Basophils Relative: 1 %
Eosinophils Absolute: 0.6 K/uL — ABNORMAL HIGH (ref 0.0–0.5)
Eosinophils Relative: 5 %
HCT: 37.8 % — ABNORMAL LOW (ref 39.0–52.0)
Hemoglobin: 14.2 g/dL (ref 13.0–17.0)
Immature Granulocytes: 1 %
Lymphocytes Relative: 31 %
Lymphs Abs: 3.6 K/uL (ref 0.7–4.0)
MCH: 30.3 pg (ref 26.0–34.0)
MCHC: 37.6 g/dL — ABNORMAL HIGH (ref 30.0–36.0)
MCV: 80.8 fL (ref 80.0–100.0)
Monocytes Absolute: 1.4 K/uL — ABNORMAL HIGH (ref 0.1–1.0)
Monocytes Relative: 12 %
Neutro Abs: 5.9 K/uL (ref 1.7–7.7)
Neutrophils Relative %: 50 %
Platelets: 348 K/uL (ref 150–400)
RBC: 4.68 MIL/uL (ref 4.22–5.81)
RDW: 15 % (ref 11.5–15.5)
WBC: 11.6 K/uL — ABNORMAL HIGH (ref 4.0–10.5)
nRBC: 2.1 % — ABNORMAL HIGH (ref 0.0–0.2)

## 2024-02-22 LAB — TROPONIN T, HIGH SENSITIVITY
Troponin T High Sensitivity: <15 ng/L (ref 0–19)
Troponin T High Sensitivity: <15 ng/L (ref 0–19)

## 2024-02-22 LAB — COMPREHENSIVE METABOLIC PANEL WITH GFR
ALT: 21 U/L (ref 0–44)
AST: 26 U/L (ref 15–41)
Albumin: 4.3 g/dL (ref 3.5–5.0)
Alkaline Phosphatase: 108 U/L (ref 38–126)
Anion gap: 10 (ref 5–15)
BUN: 16 mg/dL (ref 6–20)
CO2: 23 mmol/L (ref 22–32)
Calcium: 10.9 mg/dL — ABNORMAL HIGH (ref 8.9–10.3)
Chloride: 102 mmol/L (ref 98–111)
Creatinine, Ser: 1.27 mg/dL — ABNORMAL HIGH (ref 0.61–1.24)
GFR, Estimated: >60 mL/min
Glucose, Bld: 99 mg/dL (ref 70–99)
Potassium: 4.3 mmol/L (ref 3.5–5.1)
Sodium: 135 mmol/L (ref 135–145)
Total Bilirubin: 1.1 mg/dL (ref 0.0–1.2)
Total Protein: 7.6 g/dL (ref 6.5–8.1)

## 2024-02-22 LAB — RETICULOCYTES
Immature Retic Fract: 26.6 % — ABNORMAL HIGH (ref 2.3–15.9)
RBC.: 4.65 MIL/uL (ref 4.22–5.81)
Retic Count, Absolute: 226 K/uL — ABNORMAL HIGH (ref 19.0–186.0)
Retic Ct Pct: 4.9 % — ABNORMAL HIGH (ref 0.4–3.1)

## 2024-02-22 MED ORDER — ONDANSETRON HCL 4 MG/2ML IJ SOLN
4.0000 mg | INTRAMUSCULAR | Status: DC | PRN
  Administered 2024-02-22 – 2024-02-23 (×2): 4 mg via INTRAVENOUS
  Filled 2024-02-22 (×2): qty 2

## 2024-02-22 MED ORDER — HYDROMORPHONE HCL 1 MG/ML IJ SOLN
2.0000 mg | INTRAMUSCULAR | Status: AC
  Administered 2024-02-22: 2 mg via INTRAVENOUS
  Filled 2024-02-22: qty 2

## 2024-02-22 MED ORDER — SODIUM CHLORIDE 0.45 % IV SOLN: INTRAVENOUS | Status: AC

## 2024-02-22 MED ORDER — DIPHENHYDRAMINE HCL 25 MG PO CAPS
25.0000 mg | ORAL_CAPSULE | ORAL | Status: DC | PRN
  Administered 2024-02-22: 50 mg via ORAL
  Administered 2024-02-23 (×2): 25 mg via ORAL
  Filled 2024-02-22: qty 2
  Filled 2024-02-22 (×2): qty 1

## 2024-02-22 NOTE — ED Triage Notes (Signed)
 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 Pain 10/10

## 2024-02-22 NOTE — ED Provider Notes (Signed)
 " [place] [facility] AT [facility] Provider Note   CSN: [id number] Arrival date & time: 02/22/24  1736     Patient presents with: No chief complaint on file.   Dean Webb is a 49 y.o. male.  {Add pertinent medical, surgical, social history, OB history to HPI:32947} HPI     Legs hurting and throbbing  Chest pain off and on for a few days, sharp pressure, nothing makes it better or worse, does not typically have cp with sickle cell, does not feel like acute chest, no hx of dvt or pe No fever  A little cough Little runny nose Stomach burning, no abdominal pain, no nausea or vomiting A little dyspnea Ran out of oxycodone  last week   Past Medical History:  Diagnosis Date   Asthma    GERD (gastroesophageal reflux disease)    Hb-S/Hb-C disease (HCC) 1979   HTN (hypertension) 2008   Hypertension    Sickle cell anemia (HCC)    Sickle cell disease (HCC)     Prior to Admission medications  Medication Sig Start Date End Date Taking? Authorizing Provider  albuterol  (VENTOLIN  HFA) 108 (90 Base) MCG/ACT inhaler TAKE 2 PUFFS BY MOUTH EVERY 6 HOURS AS NEEDED FOR WHEEZE OR SHORTNESS OF BREATH Patient taking differently: Inhale 2 puffs into the lungs every 6 (six) hours as needed for wheezing or shortness of breath. 09/05/22   Oley Bascom RAMAN, NP  amLODipine  (NORVASC ) 10 MG tablet Take 1 tablet (10 mg total) by mouth daily. 02/06/24   Paseda, Folashade R, FNP  amoxicillin -clavulanate (AUGMENTIN ) 875-125 MG tablet Take 1 tablet by mouth 2 (two) times daily. 10/12/23   Cherylene Homer HERO, NP  cetirizine  (ZYRTEC ) 10 MG tablet TAKE 1 TABLET BY MOUTH EVERY DAY AS NEEDED FOR ALLERGY 04/17/22   Nichols, Tonya S, NP  cloNIDine  (CATAPRES ) 0.1 MG tablet TAKE 1 TABLET BY MOUTH 2 TIMES DAILY. Patient taking differently: Take 0.1 mg by mouth daily. 07/17/22   Oley Bascom RAMAN, NP  dextromethorphan -guaiFENesin  (MUCINEX  DM) 30-600 MG 12hr tablet Take 1 tablet by mouth 2 (two) times  daily. Patient not taking: Reported on 10/08/2023 10/07/23   Jegede, Olugbemiga E, MD  DULoxetine  (CYMBALTA ) 30 MG capsule Take 1 capsule (30 mg total) by mouth daily. 08/25/22   Pokhrel, Laxman, MD  gabapentin  (NEURONTIN ) 300 MG capsule Take 1 capsule (300 mg total) by mouth 2 (two) times daily. 07/17/22   Oley Bascom RAMAN, NP  losartan -hydrochlorothiazide  (HYZAAR) 100-25 MG tablet Take 1 tablet by mouth daily. 08/27/22   Pokhrel, Laxman, MD  metoprolol  succinate (TOPROL -XL) 50 MG 24 hr tablet Take 1 tablet (50 mg total) by mouth daily. Take with or immediately following a meal. 04/17/22   Nichols, Tonya S, NP  montelukast  (SINGULAIR ) 10 MG tablet TAKE 1 TABLET BY MOUTH EVERYDAY AT BEDTIME 04/17/22   Nichols, Tonya S, NP  nicotine  (NICODERM CQ  - DOSED IN MG/24 HOURS) 21 mg/24hr patch Place 1 patch (21 mg total) onto the skin daily. 05/06/23   Marry Clamp, MD  Oxycodone  HCl 10 MG TABS Take 1 tablet (10 mg total) by mouth every 6 (six) hours as needed. 02/06/24   Paseda, Folashade R, FNP  pantoprazole  (PROTONIX ) 40 MG tablet Take 1 tablet (40 mg total) by mouth daily. 04/17/22   Nichols, Tonya S, NP  tiZANidine  (ZANAFLEX ) 4 MG tablet Take 1 tablet (4 mg total) by mouth every 6 (six) hours as needed for muscle spasms. 10/13/22   Oley Bascom RAMAN, NP  Allergies: Patient has no known allergies.    Review of Systems  Updated Vital Signs BP (!) 157/101 (BP Location: Right Arm)   Pulse 87   Temp 98.4 F (36.9 C) (Oral)   Resp 18   Ht 5' 7 (1.702 m)   Wt 91.2 kg   SpO2 98%   BMI 31.48 kg/m   Physical Exam  (all labs ordered are listed, but only abnormal results are displayed) Labs Reviewed  CBC WITH DIFFERENTIAL/PLATELET - Abnormal; Notable for the following components:      Result Value   WBC 11.6 (*)    HCT 37.8 (*)    MCHC 37.6 (*)    nRBC 2.1 (*)    All other components within normal limits  COMPREHENSIVE METABOLIC PANEL WITH GFR  RETICULOCYTES  CBC  TROPONIN T, HIGH SENSITIVITY     EKG: None  Radiology: DG Chest 2 View Result Date: 02/22/2024 EXAM: 2 VIEW(S) XRAY OF THE CHEST 02/22/2024 06:29:00 PM COMPARISON: 10/02/2023 CLINICAL HISTORY: chest pain FINDINGS: LUNGS AND PLEURA: No focal pulmonary opacity. No pleural effusion. No pneumothorax. HEART AND MEDIASTINUM: No acute abnormality of the cardiac and mediastinal silhouettes. BONES AND SOFT TISSUES: Remote right clavicular fracture, unchanged. IMPRESSION: 1. No acute findings. Electronically signed by: Morgane Naveau MD MD 02/22/2024 06:56 PM EST RP Workstation: [id number]    {Document cardiac monitor, telemetry assessment procedure when appropriate:32947} Procedures   Medications Ordered in the ED - No data to display    {Click here for ABCD2, HEART and other calculators REFRESH Note before signing:1}                              Medical Decision Making Amount and/or Complexity of Data Reviewed Labs: ordered. Radiology: ordered.  Risk Prescription drug management.   ***  {Document critical care time when appropriate  Document review of labs and clinical decision tools ie CHADS2VASC2, etc  Document your independent review of radiology images and any outside records  Document your discussion with family members, caretakers and with consultants  Document social determinants of health affecting pt's care  Document your decision making why or why not admission, treatments were needed:32947:::1}   Final diagnoses:  None    ED Discharge Orders     None        "

## 2024-02-23 ENCOUNTER — Inpatient Hospital Stay (HOSPITAL_COMMUNITY): Payer: MEDICAID

## 2024-02-23 ENCOUNTER — Encounter (HOSPITAL_COMMUNITY): Payer: Self-pay | Admitting: Internal Medicine

## 2024-02-23 DIAGNOSIS — Z79899 Other long term (current) drug therapy: Secondary | ICD-10-CM | POA: Diagnosis not present

## 2024-02-23 DIAGNOSIS — R112 Nausea with vomiting, unspecified: Secondary | ICD-10-CM | POA: Insufficient documentation

## 2024-02-23 DIAGNOSIS — G894 Chronic pain syndrome: Secondary | ICD-10-CM | POA: Diagnosis present

## 2024-02-23 DIAGNOSIS — J4520 Mild intermittent asthma, uncomplicated: Secondary | ICD-10-CM | POA: Diagnosis present

## 2024-02-23 DIAGNOSIS — D638 Anemia in other chronic diseases classified elsewhere: Secondary | ICD-10-CM | POA: Diagnosis present

## 2024-02-23 DIAGNOSIS — D72829 Elevated white blood cell count, unspecified: Secondary | ICD-10-CM | POA: Diagnosis present

## 2024-02-23 DIAGNOSIS — Z8249 Family history of ischemic heart disease and other diseases of the circulatory system: Secondary | ICD-10-CM | POA: Diagnosis not present

## 2024-02-23 DIAGNOSIS — K219 Gastro-esophageal reflux disease without esophagitis: Secondary | ICD-10-CM | POA: Diagnosis present

## 2024-02-23 DIAGNOSIS — Z87891 Personal history of nicotine dependence: Secondary | ICD-10-CM | POA: Diagnosis not present

## 2024-02-23 DIAGNOSIS — D5700 Hb-SS disease with crisis, unspecified: Secondary | ICD-10-CM | POA: Diagnosis present

## 2024-02-23 DIAGNOSIS — N179 Acute kidney failure, unspecified: Secondary | ICD-10-CM | POA: Diagnosis present

## 2024-02-23 DIAGNOSIS — G4733 Obstructive sleep apnea (adult) (pediatric): Secondary | ICD-10-CM | POA: Diagnosis present

## 2024-02-23 DIAGNOSIS — Z825 Family history of asthma and other chronic lower respiratory diseases: Secondary | ICD-10-CM | POA: Diagnosis not present

## 2024-02-23 DIAGNOSIS — I10 Essential (primary) hypertension: Secondary | ICD-10-CM | POA: Diagnosis present

## 2024-02-23 DIAGNOSIS — R079 Chest pain, unspecified: Secondary | ICD-10-CM | POA: Diagnosis not present

## 2024-02-23 DIAGNOSIS — Z833 Family history of diabetes mellitus: Secondary | ICD-10-CM | POA: Diagnosis not present

## 2024-02-23 DIAGNOSIS — Z832 Family history of diseases of the blood and blood-forming organs and certain disorders involving the immune mechanism: Secondary | ICD-10-CM | POA: Diagnosis not present

## 2024-02-23 DIAGNOSIS — D57219 Sickle-cell/Hb-C disease with crisis, unspecified: Secondary | ICD-10-CM | POA: Diagnosis present

## 2024-02-23 LAB — BASIC METABOLIC PANEL WITH GFR
Anion gap: 8 (ref 5–15)
BUN: 11 mg/dL (ref 6–20)
CO2: 24 mmol/L (ref 22–32)
Calcium: 10 mg/dL (ref 8.9–10.3)
Chloride: 106 mmol/L (ref 98–111)
Creatinine, Ser: 0.97 mg/dL (ref 0.61–1.24)
GFR, Estimated: >60 mL/min
Glucose, Bld: 104 mg/dL — ABNORMAL HIGH (ref 70–99)
Potassium: 4 mmol/L (ref 3.5–5.1)
Sodium: 138 mmol/L (ref 135–145)

## 2024-02-23 LAB — CBC WITH DIFFERENTIAL/PLATELET
Abs Immature Granulocytes: 0.06 K/uL (ref 0.00–0.07)
Basophils Absolute: 0 K/uL (ref 0.0–0.1)
Basophils Relative: 0 %
Eosinophils Absolute: 0.2 K/uL (ref 0.0–0.5)
Eosinophils Relative: 2 %
HCT: 36.5 % — ABNORMAL LOW (ref 39.0–52.0)
Hemoglobin: 13.8 g/dL (ref 13.0–17.0)
Immature Granulocytes: 1 %
Lymphocytes Relative: 22 %
Lymphs Abs: 2.5 K/uL (ref 0.7–4.0)
MCH: 30.5 pg (ref 26.0–34.0)
MCHC: 37.8 g/dL — ABNORMAL HIGH (ref 30.0–36.0)
MCV: 80.6 fL (ref 80.0–100.0)
Monocytes Absolute: 1.2 K/uL — ABNORMAL HIGH (ref 0.1–1.0)
Monocytes Relative: 11 %
Neutro Abs: 7.4 K/uL (ref 1.7–7.7)
Neutrophils Relative %: 64 %
Platelets: 344 K/uL (ref 150–400)
RBC: 4.53 MIL/uL (ref 4.22–5.81)
RDW: 15 % (ref 11.5–15.5)
Smear Review: NORMAL
WBC: 11.5 K/uL — ABNORMAL HIGH (ref 4.0–10.5)
nRBC: 1.8 % — ABNORMAL HIGH (ref 0.0–0.2)

## 2024-02-23 LAB — LIPASE, BLOOD: Lipase: 31 U/L (ref 11–51)

## 2024-02-23 LAB — HEPATIC FUNCTION PANEL
ALT: 28 U/L (ref 0–44)
AST: 36 U/L (ref 15–41)
Albumin: 4.1 g/dL (ref 3.5–5.0)
Alkaline Phosphatase: 93 U/L (ref 38–126)
Bilirubin, Direct: 0.7 mg/dL — ABNORMAL HIGH (ref 0.0–0.2)
Indirect Bilirubin: 0.7 mg/dL (ref 0.3–0.9)
Total Bilirubin: 1.4 mg/dL — ABNORMAL HIGH (ref 0.0–1.2)
Total Protein: 7 g/dL (ref 6.5–8.1)

## 2024-02-23 MED ORDER — AMLODIPINE BESYLATE 10 MG PO TABS
10.0000 mg | ORAL_TABLET | Freq: Every day | ORAL | Status: DC
  Administered 2024-02-23 – 2024-02-27 (×5): 10 mg via ORAL
  Filled 2024-02-23: qty 2
  Filled 2024-02-23 (×4): qty 1

## 2024-02-23 MED ORDER — SODIUM CHLORIDE 0.9% FLUSH: 9.0000 mL | INTRAVENOUS | Status: DC | PRN

## 2024-02-23 MED ORDER — SENNOSIDES-DOCUSATE SODIUM 8.6-50 MG PO TABS
1.0000 | ORAL_TABLET | Freq: Two times a day (BID) | ORAL | Status: DC
  Administered 2024-02-23 – 2024-02-27 (×9): 1 via ORAL
  Filled 2024-02-23 (×9): qty 1

## 2024-02-23 MED ORDER — ONDANSETRON HCL 4 MG/2ML IJ SOLN
4.0000 mg | INTRAMUSCULAR | Status: DC | PRN
  Administered 2024-02-23: 4 mg via INTRAVENOUS
  Filled 2024-02-23: qty 2

## 2024-02-23 MED ORDER — ONDANSETRON HCL 4 MG PO TABS: 4.0000 mg | ORAL_TABLET | ORAL | Status: DC | PRN

## 2024-02-23 MED ORDER — POLYETHYLENE GLYCOL 3350 17 G PO PACK: 17.0000 g | PACK | Freq: Every day | ORAL | Status: DC | PRN

## 2024-02-23 MED ORDER — HYDROMORPHONE HCL 1 MG/ML IJ SOLN
1.0000 mg | INTRAMUSCULAR | Status: DC | PRN
  Administered 2024-02-23 (×2): 1 mg via INTRAVENOUS
  Filled 2024-02-23 (×2): qty 1

## 2024-02-23 MED ORDER — ENOXAPARIN SODIUM 40 MG/0.4ML IJ SOSY
40.0000 mg | PREFILLED_SYRINGE | INTRAMUSCULAR | Status: DC
  Administered 2024-02-23 – 2024-02-27 (×5): 40 mg via SUBCUTANEOUS
  Filled 2024-02-23 (×5): qty 0.4

## 2024-02-23 MED ORDER — NALOXONE HCL 0.4 MG/ML IJ SOLN: 0.4000 mg | INTRAMUSCULAR | Status: DC | PRN

## 2024-02-23 MED ORDER — HYDROMORPHONE 1 MG/ML IV SOLN
INTRAVENOUS | Status: DC
  Administered 2024-02-23: 0.6 mg via INTRAVENOUS
  Administered 2024-02-23: 30 mg via INTRAVENOUS
  Administered 2024-02-24: 0.9 mg via INTRAVENOUS
  Administered 2024-02-24: 0.3 mg via INTRAVENOUS
  Administered 2024-02-24: 1.2 mg via INTRAVENOUS
  Administered 2024-02-24: 0.3 mg via INTRAVENOUS
  Administered 2024-02-24 (×2): 0.6 mg via INTRAVENOUS
  Administered 2024-02-24: 0.3 mg via INTRAVENOUS
  Administered 2024-02-25 (×2): 0.6 mg via INTRAVENOUS
  Administered 2024-02-25: 0.9 mg via INTRAVENOUS
  Administered 2024-02-25: 0.6 mg via INTRAVENOUS
  Administered 2024-02-25: 1.2 mg via INTRAVENOUS
  Administered 2024-02-25: 0.6 mg via INTRAVENOUS
  Administered 2024-02-26: 1.2 mg via INTRAVENOUS
  Administered 2024-02-26: 0.9 mg via INTRAVENOUS
  Filled 2024-02-23: qty 30

## 2024-02-23 MED ORDER — ORAL CARE MOUTH RINSE: 15.0000 mL | OROMUCOSAL | Status: DC | PRN

## 2024-02-23 NOTE — ED Notes (Signed)
 Messaged MD for pain medication not delivered by PCA - floor order.

## 2024-02-23 NOTE — Plan of Care (Signed)
  Problem: Education: Goal: Knowledge of vaso-occlusive preventative measures will improve Outcome: Not Progressing Goal: Awareness of infection prevention will improve Outcome: Not Progressing Goal: Awareness of signs and symptoms of anemia will improve Outcome: Not Progressing Goal: Long-term complications will improve Outcome: Not Progressing   Problem: Self-Care: Goal: Ability to incorporate actions that prevent/reduce pain crisis will improve Outcome: Not Progressing   Problem: Bowel/Gastric: Goal: Gut motility will be maintained Outcome: Not Progressing   Problem: Tissue Perfusion: Goal: Complications related to inadequate tissue perfusion will be avoided or minimized Outcome: Not Progressing

## 2024-02-23 NOTE — ED Notes (Signed)
 Messaged MD again pending order for pain medication.  Patient has made multiple requests.

## 2024-02-23 NOTE — Plan of Care (Signed)
  Problem: Education: Goal: Knowledge of vaso-occlusive preventative measures will improve Outcome: Progressing Goal: Awareness of infection prevention will improve Outcome: Progressing Goal: Awareness of signs and symptoms of anemia will improve Outcome: Progressing Goal: Long-term complications will improve Outcome: Progressing   Problem: Self-Care: Goal: Ability to incorporate actions that prevent/reduce pain crisis will improve Outcome: Progressing   Problem: Bowel/Gastric: Goal: Gut motility will be maintained Outcome: Progressing   Problem: Respiratory: Goal: Pulmonary complications will be avoided or minimized Outcome: Progressing Goal: Acute Chest Syndrome will be identified early to prevent complications Outcome: Progressing   Problem: Fluid Volume: Goal: Ability to maintain a balanced intake and output will improve Outcome: Progressing

## 2024-02-23 NOTE — H&P (Signed)
 " History and Physical    Dean Webb FMW:[id number] DOB: [birth year] DOA: 02/22/2024  Patient coming from: Home.  Chief Complaint: Lower extremity pain and nausea vomiting.  HPI: Dean Webb is a 49 y.o. male with history of sickle cell anemia presents to the ER with complaints of lower extremity pain typical of his sickle cell pain crisis.  In addition patient also was complaining of abdominal discomfort with multiple episodes of nausea vomiting.  Denies any fever chills difficulty breathing headache or any visual symptoms.  Patient states he ran out of his oxycodone  last week.  ED Course: In the ER patient's labs show creatinine 1.27 calcium 10.9 WBC 11.6 troponins were negative.  CT abdomen pelvis did not show anything acute.  Hemoglobin is 14.2 reticulocyte count is 4.9 percentage.  Patient was given IV Dilaudid  for pain relief despite which patient was still in pain admitted for further pain control.  Review of Systems: As per HPI, rest all negative.   Past Medical History:  Diagnosis Date   Asthma    GERD (gastroesophageal reflux disease)    Hb-S/Hb-C disease (HCC) 1979   HTN (hypertension) 2008   Hypertension    Sickle cell anemia (HCC)    Sickle cell disease (HCC)     Past Surgical History:  Procedure Laterality Date   CHOLECYSTECTOMY       reports that he has been smoking cigarettes. He has a 8.5 pack-year smoking history. He has never used smokeless tobacco. He reports current alcohol use. He reports that he does not currently use drugs after having used the following drugs: Marijuana.  Allergies[1]  Family History  Problem Relation Age of Onset   Sickle cell trait Mother    Hypertension Mother    Asthma Mother    Sickle cell trait Father    Congestive Heart Failure Father    Hypertension Sister    Sickle cell anemia Daughter    Hypertension Daughter    Diabetes Daughter     Prior to Admission medications  Medication Sig Start Date End Date Taking?  Authorizing Provider  albuterol  (VENTOLIN  HFA) 108 (90 Base) MCG/ACT inhaler TAKE 2 PUFFS BY MOUTH EVERY 6 HOURS AS NEEDED FOR WHEEZE OR SHORTNESS OF BREATH Patient taking differently: Inhale 2 puffs into the lungs every 6 (six) hours as needed for wheezing or shortness of breath. 09/05/22   Oley Bascom RAMAN, NP  amLODipine  (NORVASC ) 10 MG tablet Take 1 tablet (10 mg total) by mouth daily. 02/06/24   Paseda, Folashade R, FNP  amoxicillin -clavulanate (AUGMENTIN ) 875-125 MG tablet Take 1 tablet by mouth 2 (two) times daily. 10/12/23   Ijaola, Onyeje M, NP  cetirizine  (ZYRTEC ) 10 MG tablet TAKE 1 TABLET BY MOUTH EVERY DAY AS NEEDED FOR ALLERGY 04/17/22   Nichols, Tonya S, NP  cloNIDine  (CATAPRES ) 0.1 MG tablet TAKE 1 TABLET BY MOUTH 2 TIMES DAILY. Patient taking differently: Take 0.1 mg by mouth daily. 07/17/22   Nichols, Tonya S, NP  dextromethorphan -guaiFENesin  (MUCINEX  DM) 30-600 MG 12hr tablet Take 1 tablet by mouth 2 (two) times daily. Patient not taking: Reported on 10/08/2023 10/07/23   Jegede, Olugbemiga E, MD  DULoxetine  (CYMBALTA ) 30 MG capsule Take 1 capsule (30 mg total) by mouth daily. 08/25/22   Pokhrel, Laxman, MD  gabapentin  (NEURONTIN ) 300 MG capsule Take 1 capsule (300 mg total) by mouth 2 (two) times daily. 07/17/22   Oley Bascom RAMAN, NP  losartan -hydrochlorothiazide  (HYZAAR) 100-25 MG tablet Take 1 tablet by mouth daily. 08/27/22  Pokhrel, Laxman, MD  metoprolol  succinate (TOPROL -XL) 50 MG 24 hr tablet Take 1 tablet (50 mg total) by mouth daily. Take with or immediately following a meal. 04/17/22   Nichols, Tonya S, NP  montelukast  (SINGULAIR ) 10 MG tablet TAKE 1 TABLET BY MOUTH EVERYDAY AT BEDTIME 04/17/22   Nichols, Tonya S, NP  nicotine  (NICODERM CQ  - DOSED IN MG/24 HOURS) 21 mg/24hr patch Place 1 patch (21 mg total) onto the skin daily. 05/06/23   Marry Clamp, MD  Oxycodone  HCl 10 MG TABS Take 1 tablet (10 mg total) by mouth every 6 (six) hours as needed. 02/06/24   Paseda, Folashade R, FNP   pantoprazole  (PROTONIX ) 40 MG tablet Take 1 tablet (40 mg total) by mouth daily. 04/17/22   Nichols, Tonya S, NP  tiZANidine  (ZANAFLEX ) 4 MG tablet Take 1 tablet (4 mg total) by mouth every 6 (six) hours as needed for muscle spasms. 10/13/22   Oley Bascom RAMAN, NP    Physical Exam: Constitutional: Moderately built and nourished. Vitals:   02/22/24 2120 02/22/24 2200 02/23/24 0100 02/23/24 0317  BP: (!) 143/83 134/87 (!) 171/94 (!) 144/74  Pulse: (!) 59 (!) 59 (!) 59 (!) 55  Resp: 17 10 (!) 9 15  Temp: 98.1 F (36.7 C)  98 F (36.7 C) 98.1 F (36.7 C)  TempSrc: Oral  Oral Oral  SpO2: 99% 98% 98% 96%  Weight:      Height:       Eyes: Anicteric no pallor. ENMT: No discharge from the ears eyes nose or mouth. Neck: No mass felt.  No neck rigidity. Respiratory: No rhonchi or crepitations. Cardiovascular: S1-S2 heard. Abdomen: Soft nontender bowel sound present. Musculoskeletal: No edema. Skin: No rash. Neurologic: Alert awake oriented to time place and person.  Moves all extremities. Psychiatric: Appears normal.  Normal affect.   Labs on Admission: I have personally reviewed following labs and imaging studies  CBC: Recent Labs  Lab 02/22/24 1837  WBC 11.6*  NEUTROABS 5.9  HGB 14.2  HCT 37.8*  MCV 80.8  PLT 348   Basic Metabolic Panel: Recent Labs  Lab 02/22/24 1837  NA 135  K 4.3  CL 102  CO2 23  GLUCOSE 99  BUN 16  CREATININE 1.27*  CALCIUM 10.9*   GFR: Estimated Creatinine Clearance: 77.4 mL/min (A) (by C-G formula based on SCr of 1.27 mg/dL (H)). Liver Function Tests: Recent Labs  Lab 02/22/24 1837  AST 26  ALT 21  ALKPHOS 108  BILITOT 1.1  PROT 7.6  ALBUMIN 4.3   No results for input(s): LIPASE, AMYLASE in the last 168 hours. No results for input(s): AMMONIA in the last 168 hours. Coagulation Profile: No results for input(s): INR, PROTIME in the last 168 hours. Cardiac Enzymes: No results for input(s): CKTOTAL, CKMB, CKMBINDEX,  TROPONINI in the last 168 hours. BNP (last 3 results) No results for input(s): PROBNP in the last 8760 hours. HbA1C: No results for input(s): HGBA1C in the last 72 hours. CBG: No results for input(s): GLUCAP in the last 168 hours. Lipid Profile: No results for input(s): CHOL, HDL, LDLCALC, TRIG, CHOLHDL, LDLDIRECT in the last 72 hours. Thyroid  Function Tests: No results for input(s): TSH, T4TOTAL, FREET4, T3FREE, THYROIDAB in the last 72 hours. Anemia Panel: Recent Labs    02/22/24 1837  RETICCTPCT 4.9*   Urine analysis:    Component Value Date/Time   COLORURINE YELLOW 10/03/2023 0238   APPEARANCEUR CLEAR 10/03/2023 0238   LABSPEC 1.012 10/03/2023 0238   PHURINE 6.0  10/03/2023 0238   GLUCOSEU NEGATIVE 10/03/2023 0238   HGBUR NEGATIVE 10/03/2023 0238   BILIRUBINUR NEGATIVE 10/03/2023 0238   BILIRUBINUR negative 01/17/2021 1608   BILIRUBINUR neg 08/26/2020 1044   KETONESUR NEGATIVE 10/03/2023 0238   PROTEINUR NEGATIVE 10/03/2023 0238   UROBILINOGEN 0.2 01/17/2021 1608   UROBILINOGEN 0.2 11/01/2012 1010   NITRITE NEGATIVE 10/03/2023 0238   LEUKOCYTESUR NEGATIVE 10/03/2023 0238   Sepsis Labs: @LABRCNTIP (procalcitonin:4,lacticidven:4) )No results found for this or any previous visit (from the past 240 hours).   Radiological Exams on Admission: DG Chest 2 View Result Date: 02/22/2024 EXAM: 2 VIEW(S) XRAY OF THE CHEST 02/22/2024 06:29:00 PM COMPARISON: 10/02/2023 CLINICAL HISTORY: chest pain FINDINGS: LUNGS AND PLEURA: No focal pulmonary opacity. No pleural effusion. No pneumothorax. HEART AND MEDIASTINUM: No acute abnormality of the cardiac and mediastinal silhouettes. BONES AND SOFT TISSUES: Remote right clavicular fracture, unchanged. IMPRESSION: 1. No acute findings. Electronically signed by: Morgane Naveau MD MD 02/22/2024 06:56 PM EST RP Workstation: [id number]    EKG: Independently reviewed.  Normal sinus rhythm.  Nonspecific ST-T  changes.  Assessment/Plan Active Problems:   Sickle cell pain crisis (HCC)   Essential hypertension   OSA (obstructive sleep apnea)   Mild intermittent asthma without complication   Abdominal pain   Nausea & vomiting    Sickle cell pain crisis will keep patient on Dilaudid  PCA.  IV fluids. Intractable nausea vomiting with abdominal discomfort CT abdomen pelvis unremarkable.  LFTs were also within normal range.  Antiemetics ordered.  Advance diet as tolerated. History of hypertension not sure if patient has been compliant with his medications.  On reviewing his medication refills has not refilled many of his medicines recently.  Will keep patient on amlodipine  and follow blood pressure trends. Hypercalcemia appears to be chronic.  Will need further workup as outpatient. Acute renal failure with creatinine of 1.2 increased from baseline.  Could be from nausea vomiting.  Gently hydrate follow metabolic panel.  Since patient is requiring IV Dilaudid  PCA for pain control with intractable nausea vomiting acute renal failure will need more than 2 midnight stay.  DVT prophylaxis: Lovenox . Code Status: Full code. Family Communication: Discussed with patient. Disposition Plan: Medical floor. Consults called: None. Admission status: Inpatient.         [1] No Known Allergies  "

## 2024-02-23 NOTE — ED Provider Notes (Incomplete)
 " [place] EMERGENCY DEPARTMENT AT [facility] Provider Note   CSN: [id number] Arrival date & time: 02/22/24  1736     Patient presents with: No chief complaint on file.   Dean Webb is a 49 y.o. male.  {Add pertinent medical, surgical, social history, OB history to HPI:32947} HPI     Legs hurting and throbbing  Chest pain off and on for a few days, sharp pressure, nothing makes it better or worse, does not typically have cp with sickle cell, does not feel like acute chest, no hx of dvt or pe Not pleuritic or erxertional pain, is better with cold water No fever  A little cough Little runny nose Stomach burning, no abdominal pain, no nausea or vomiting A little dyspnea Ran out of oxycodone  last week   Past Medical History:  Diagnosis Date   Asthma    GERD (gastroesophageal reflux disease)    Hb-S/Hb-C disease (HCC) 1979   HTN (hypertension) 2008   Hypertension    Sickle cell anemia (HCC)    Sickle cell disease (HCC)     Prior to Admission medications  Medication Sig Start Date End Date Taking? Authorizing Provider  albuterol  (VENTOLIN  HFA) 108 (90 Base) MCG/ACT inhaler TAKE 2 PUFFS BY MOUTH EVERY 6 HOURS AS NEEDED FOR WHEEZE OR SHORTNESS OF BREATH Patient taking differently: Inhale 2 puffs into the lungs every 6 (six) hours as needed for wheezing or shortness of breath. 09/05/22   Oley Bascom RAMAN, NP  amLODipine  (NORVASC ) 10 MG tablet Take 1 tablet (10 mg total) by mouth daily. 02/06/24   Paseda, Folashade R, FNP  amoxicillin -clavulanate (AUGMENTIN ) 875-125 MG tablet Take 1 tablet by mouth 2 (two) times daily. 10/12/23   Ijaola, Onyeje M, NP  cetirizine  (ZYRTEC ) 10 MG tablet TAKE 1 TABLET BY MOUTH EVERY DAY AS NEEDED FOR ALLERGY 04/17/22   Nichols, Tonya S, NP  cloNIDine  (CATAPRES ) 0.1 MG tablet TAKE 1 TABLET BY MOUTH 2 TIMES DAILY. Patient taking differently: Take 0.1 mg by mouth daily. 07/17/22   Nichols, Tonya S, NP  dextromethorphan -guaiFENesin   (MUCINEX  DM) 30-600 MG 12hr tablet Take 1 tablet by mouth 2 (two) times daily. Patient not taking: Reported on 10/08/2023 10/07/23   Jegede, Olugbemiga E, MD  DULoxetine  (CYMBALTA ) 30 MG capsule Take 1 capsule (30 mg total) by mouth daily. 08/25/22   Pokhrel, Laxman, MD  gabapentin  (NEURONTIN ) 300 MG capsule Take 1 capsule (300 mg total) by mouth 2 (two) times daily. 07/17/22   Oley Bascom RAMAN, NP  losartan -hydrochlorothiazide  (HYZAAR) 100-25 MG tablet Take 1 tablet by mouth daily. 08/27/22   Pokhrel, Laxman, MD  metoprolol  succinate (TOPROL -XL) 50 MG 24 hr tablet Take 1 tablet (50 mg total) by mouth daily. Take with or immediately following a meal. 04/17/22   Nichols, Tonya S, NP  montelukast  (SINGULAIR ) 10 MG tablet TAKE 1 TABLET BY MOUTH EVERYDAY AT BEDTIME 04/17/22   Nichols, Tonya S, NP  nicotine  (NICODERM CQ  - DOSED IN MG/24 HOURS) 21 mg/24hr patch Place 1 patch (21 mg total) onto the skin daily. 05/06/23   Marry Clamp, MD  Oxycodone  HCl 10 MG TABS Take 1 tablet (10 mg total) by mouth every 6 (six) hours as needed. 02/06/24   Paseda, Folashade R, FNP  pantoprazole  (PROTONIX ) 40 MG tablet Take 1 tablet (40 mg total) by mouth daily. 04/17/22   Nichols, Tonya S, NP  tiZANidine  (ZANAFLEX ) 4 MG tablet Take 1 tablet (4 mg total) by mouth every 6 (six) hours as needed  for muscle spasms. 10/13/22   Oley Bascom RAMAN, NP    Allergies: Patient has no known allergies.    Review of Systems  Updated Vital Signs BP (!) 157/101 (BP Location: Right Arm)   Pulse 87   Temp 98.4 F (36.9 C) (Oral)   Resp 18   Ht 5' 7 (1.702 m)   Wt 91.2 kg   SpO2 98%   BMI 31.48 kg/m   Physical Exam  (all labs ordered are listed, but only abnormal results are displayed) Labs Reviewed  CBC WITH DIFFERENTIAL/PLATELET - Abnormal; Notable for the following components:      Result Value   WBC 11.6 (*)    HCT 37.8 (*)    MCHC 37.6 (*)    nRBC 2.1 (*)    All other components within normal limits  COMPREHENSIVE METABOLIC  PANEL WITH GFR  RETICULOCYTES  CBC  TROPONIN T, HIGH SENSITIVITY    EKG: None  Radiology: DG Chest 2 View Result Date: 02/22/2024 EXAM: 2 VIEW(S) XRAY OF THE CHEST 02/22/2024 06:29:00 PM COMPARISON: 10/02/2023 CLINICAL HISTORY: chest pain FINDINGS: LUNGS AND PLEURA: No focal pulmonary opacity. No pleural effusion. No pneumothorax. HEART AND MEDIASTINUM: No acute abnormality of the cardiac and mediastinal silhouettes. BONES AND SOFT TISSUES: Remote right clavicular fracture, unchanged. IMPRESSION: 1. No acute findings. Electronically signed by: Morgane Naveau MD MD 02/22/2024 06:56 PM EST RP Workstation: [id number]    {Document cardiac monitor, telemetry assessment procedure when appropriate:32947} Procedures   Medications Ordered in the ED - No data to display    {Click here for ABCD2, HEART and other calculators REFRESH Note before signing:1}                              Medical Decision Making Amount and/or Complexity of Data Reviewed Labs: ordered. Radiology: ordered.  Risk Prescription drug management.   ***  {Document critical care time when appropriate  Document review of labs and clinical decision tools ie CHADS2VASC2, etc  Document your independent review of radiology images and any outside records  Document your discussion with family members, caretakers and with consultants  Document social determinants of health affecting pt's care  Document your decision making why or why not admission, treatments were needed:32947:::1}   Final diagnoses:  None    ED Discharge Orders     None        "

## 2024-02-24 DIAGNOSIS — D5700 Hb-SS disease with crisis, unspecified: Secondary | ICD-10-CM | POA: Diagnosis not present

## 2024-02-24 DIAGNOSIS — I10 Essential (primary) hypertension: Secondary | ICD-10-CM

## 2024-02-24 DIAGNOSIS — G4733 Obstructive sleep apnea (adult) (pediatric): Secondary | ICD-10-CM

## 2024-02-24 NOTE — Plan of Care (Signed)
" °  Problem: Education: Goal: Awareness of infection prevention will improve Outcome: Progressing   Problem: Bowel/Gastric: Goal: Gut motility will be maintained Outcome: Progressing   Problem: Fluid Volume: Goal: Ability to maintain a balanced intake and output will improve Outcome: Progressing   Problem: Elimination: Goal: Will not experience complications related to bowel motility Outcome: Progressing Goal: Will not experience complications related to urinary retention Outcome: Progressing   Problem: Safety: Goal: Ability to remain free from injury will improve Outcome: Progressing   Problem: Skin Integrity: Goal: Risk for impaired skin integrity will decrease Outcome: Progressing   "

## 2024-02-24 NOTE — Plan of Care (Signed)
" °  Problem: Education: Goal: Knowledge of vaso-occlusive preventative measures will improve Outcome: Progressing Goal: Awareness of signs and symptoms of anemia will improve Outcome: Progressing Goal: Long-term complications will improve Outcome: Progressing   Problem: Education: Goal: Awareness of infection prevention will improve Outcome: Not Progressing   Problem: Self-Care: Goal: Ability to incorporate actions that prevent/reduce pain crisis will improve Outcome: Not Progressing   Problem: Bowel/Gastric: Goal: Gut motility will be maintained Outcome: Not Progressing   "

## 2024-02-24 NOTE — Progress Notes (Signed)
 SICKLE CELL SERVICE PROGRESS NOTE  Dean Webb FMW:[id number] DOB: 08-07-1975 DOA: 02/22/2024 PCP: Dean Bascom RAMAN, NP  Assessment/Plan: Active Problems:   Sickle cell pain crisis (HCC)   Essential hypertension   OSA (obstructive sleep apnea)   Hypercalcemia   Mild intermittent asthma without complication   Abdominal pain   Nausea & vomiting  Sickle cell pain crisis: Patient is currently on Dilaudid  PCA.  Also Toradol  and IV fluids.  Continue oral medications. Chronic pain syndrome: Continue chronic oral medications. Anemia of chronic disease: H&H appears to be stable at baseline.  Continue to monitor GERD: Continues PPIs. Essential hypertension: Blood pressure is controlled.  Continue treatment History of asthma: Continue breathing treatment.  Code Status: Full code Family Communication: No family at bedside Disposition Plan: Home  [facility]  Pager [phone]. If 7PM-7AM, please contact night-coverage.  02/24/2024, 1:11 PM  LOS: 1 day   Brief narrative:  Dean Webb is a 49 y.o. male with history of sickle cell anemia presents to the ER with complaints of lower extremity pain typical of his sickle cell pain crisis.  In addition patient also was complaining of abdominal discomfort with multiple episodes of nausea vomiting.  Denies any fever chills difficulty breathing headache or any visual symptoms.  Patient states he ran out of his oxycodone  last week.   Consultants: None  Procedures: Chest x-ray  Antibiotics: None  HPI/Subjective: Patient's pain is still at 8 out of 10.  No fever no chills no nausea vomiting or diarrhea  Objective: Vitals:   02/24/24 0409 02/24/24 0503 02/24/24 0743 02/24/24 1103  BP:  (!) 150/96    Pulse:  62    Resp: 16 16 16 16   Temp:  98.3 F (36.8 C)    TempSrc:  Oral    SpO2:  98%    Weight:      Height:       Weight change:   Intake/Output Summary (Last 24 hours) at 02/24/2024 1311 Last data filed at 02/24/2024  0500 Gross per 24 hour  Intake 2056.26 ml  Output 1550 ml  Net 506.26 ml    General: Alert, awake, oriented x3, in no acute distress.  HEENT: [place]/AT PEERL, EOMI Neck: Trachea midline,  no masses, no thyromegal,y no JVD, no carotid bruit OROPHARYNX:  Moist, No exudate/ erythema/lesions.  Heart: Regular rate and rhythm, without murmurs, rubs, gallops, PMI non-displaced, no heaves or thrills on palpation.  Lungs: Clear to auscultation, no wheezing or rhonchi noted. No increased vocal fremitus resonant to percussion  Abdomen: Soft, nontender, nondistended, positive bowel sounds, no masses no hepatosplenomegaly noted..  Neuro: No focal neurological deficits noted cranial nerves II through XII grossly intact. DTRs 2+ bilaterally upper and lower extremities. Strength 5 out of 5 in bilateral upper and lower extremities. Musculoskeletal: No warm swelling or erythema around joints, no spinal tenderness noted. Psychiatric: Patient alert and oriented x3, good insight and cognition, good recent to remote recall. Lymph node survey: No cervical axillary or inguinal lymphadenopathy noted.   Data Reviewed: Basic Metabolic Panel: Recent Labs  Lab 02/22/24 1837 02/23/24 0650  NA 135 138  K 4.3 4.0  CL 102 106  CO2 23 24  GLUCOSE 99 104*  BUN 16 11  CREATININE 1.27* 0.97  CALCIUM 10.9* 10.0   Liver Function Tests: Recent Labs  Lab 02/22/24 1837 02/23/24 0650  AST 26 36  ALT 21 28  ALKPHOS 108 93  BILITOT 1.1 1.4*  PROT 7.6 7.0  ALBUMIN 4.3 4.1  Recent Labs  Lab 02/23/24 0650  LIPASE 31   No results for input(s): AMMONIA in the last 168 hours. CBC: Recent Labs  Lab 02/22/24 1837 02/23/24 0650  WBC 11.6* 11.5*  NEUTROABS 5.9 7.4  HGB 14.2 13.8  HCT 37.8* 36.5*  MCV 80.8 80.6  PLT 348 344   Cardiac Enzymes: No results for input(s): CKTOTAL, CKMB, CKMBINDEX, TROPONINI in the last 168 hours. BNP (last 3 results) No results for input(s): BNP in the last 8760  hours.  ProBNP (last 3 results) No results for input(s): PROBNP in the last 8760 hours.  CBG: No results for input(s): GLUCAP in the last 168 hours.  No results found for this or any previous visit (from the past 240 hours).   Studies: CT ABDOMEN PELVIS WO CONTRAST Result Date: 02/23/2024 CLINICAL DATA:  Nonlocalized abdominal pain. EXAM: CT ABDOMEN AND PELVIS WITHOUT CONTRAST TECHNIQUE: Multidetector CT imaging of the abdomen and pelvis was performed following the standard protocol without IV contrast. RADIATION DOSE REDUCTION: This exam was performed according to the departmental dose-optimization program which includes automated exposure control, adjustment of the mA and/or kV according to patient size and/or use of iterative reconstruction technique. COMPARISON:  10/08/2023 FINDINGS: Lower chest: Unremarkable. Hepatobiliary: A tiny hypodensity in the liver parenchyma is too small to characterize but is statistically most likely benign. No followup imaging is recommended. Small area of low attenuation in the anterior liver, adjacent to the falciform ligament, is in a characteristic location for focal fatty deposition/anomalous perfusion. Gallbladder is surgically absent. No intrahepatic or extrahepatic biliary dilation. Pancreas: No focal mass lesion. No dilatation of the main duct. No intraparenchymal cyst. No peripancreatic edema. Spleen: Auto splenectomy Adrenals/Urinary Tract: No adrenal nodule or mass. Right pelvic kidney evident. Kidneys unremarkable. No evidence for hydroureter. The urinary bladder appears normal for the degree of distention. Stomach/Bowel: Moderate distention of the stomach with food and fluid. Duodenum is normally positioned as is the ligament of Treitz. No small bowel wall thickening. No small bowel dilatation. The terminal ileum is normal. The appendix is normal. No gross colonic mass. No colonic wall thickening. Vascular/Lymphatic: There is mild atherosclerotic  calcification of the abdominal aorta without aneurysm. There is no gastrohepatic or hepatoduodenal ligament lymphadenopathy. No retroperitoneal or mesenteric lymphadenopathy. No pelvic sidewall lymphadenopathy. Reproductive: The prostate gland and seminal vesicles are unremarkable. Other: No substantial intraperitoneal free fluid. Musculoskeletal: No worrisome lytic or sclerotic osseous abnormality. IMPRESSION: 1. No acute findings in the abdomen or pelvis. Specifically, no findings to explain the patient's history of abdominal pain. 2. Right pelvic kidney. 3.  Aortic Atherosclerosis (ICD10-I70.0). Electronically Signed   By: Camellia Candle M.D.   On: 02/23/2024 05:35   DG Chest 2 View Result Date: 02/22/2024 EXAM: 2 VIEW(S) XRAY OF THE CHEST 02/22/2024 06:29:00 PM COMPARISON: 10/02/2023 CLINICAL HISTORY: chest pain FINDINGS: LUNGS AND PLEURA: No focal pulmonary opacity. No pleural effusion. No pneumothorax. HEART AND MEDIASTINUM: No acute abnormality of the cardiac and mediastinal silhouettes. BONES AND SOFT TISSUES: Remote right clavicular fracture, unchanged. IMPRESSION: 1. No acute findings. Electronically signed by: Morgane Naveau MD MD 02/22/2024 06:56 PM EST RP Workstation: [id number]    Scheduled Meds:  amLODipine   10 mg Oral Daily   enoxaparin  (LOVENOX ) injection  40 mg Subcutaneous Q24H   HYDROmorphone    Intravenous Q4H   senna-docusate  1 tablet Oral BID   Continuous Infusions:  Active Problems:   Sickle cell pain crisis (HCC)   Essential hypertension   OSA (obstructive sleep apnea)  Hypercalcemia   Mild intermittent asthma without complication   Abdominal pain   Nausea & vomiting

## 2024-02-25 DIAGNOSIS — R079 Chest pain, unspecified: Secondary | ICD-10-CM | POA: Diagnosis not present

## 2024-02-25 DIAGNOSIS — D5700 Hb-SS disease with crisis, unspecified: Secondary | ICD-10-CM | POA: Diagnosis not present

## 2024-02-25 LAB — GLUCOSE, CAPILLARY: Glucose-Capillary: 100 mg/dL — ABNORMAL HIGH (ref 70–99)

## 2024-02-25 NOTE — Plan of Care (Signed)
  Problem: Education: Goal: Knowledge of vaso-occlusive preventative measures will improve Outcome: Not Progressing Goal: Awareness of infection prevention will improve Outcome: Not Progressing Goal: Awareness of signs and symptoms of anemia will improve Outcome: Not Progressing Goal: Long-term complications will improve Outcome: Not Progressing   Problem: Self-Care: Goal: Ability to incorporate actions that prevent/reduce pain crisis will improve Outcome: Not Progressing   Problem: Bowel/Gastric: Goal: Gut motility will be maintained Outcome: Not Progressing   Problem: Tissue Perfusion: Goal: Complications related to inadequate tissue perfusion will be avoided or minimized Outcome: Not Progressing

## 2024-02-25 NOTE — Plan of Care (Signed)

## 2024-02-25 NOTE — Progress Notes (Signed)
 Patient ID: Dean Webb, male   DOB: 16-Jul-1975, 49 y.o.   MRN: [id number] Subjective: Dean Webb is a 49 y.o. male with history of sickle cell anemia presents to the ER with complaints of lower extremity pain typical of his sickle cell pain crisis.  In addition patient also was complaining of abdominal discomfort with multiple episodes of nausea & vomiting.  Denies any fever, chills, difficulty breathing, headache or any visual symptoms.  Patient states he ran out of his oxycodone  last week.   He has no new complaint today but still endorsing significant pain.  He said his pain is at 8/10 this morning, still in his lower extremities and lower back.  He has been trying to ambulate but with significant pain.  He denies any fever, cough, chest pain, shortness of breath, headache or visual symptoms.  Nausea and vomiting has subsided.  No diarrhea.  No urinary symptoms.  Objective:  Vital signs in last 24 hours:  Vitals:   02/25/24 0736 02/25/24 0939 02/25/24 1013 02/25/24 1158  BP:  (!) 150/99 (!) 154/96   Pulse:   (!) 57   Resp: 17  18 12   Temp:   97.7 F (36.5 C)   TempSrc:   Oral   SpO2:   95%   Weight:      Height:        Intake/Output from previous day:   Intake/Output Summary (Last 24 hours) at 02/25/2024 1326 Last data filed at 02/25/2024 9041 Gross per 24 hour  Intake 240 ml  Output 1925 ml  Net -1685 ml    Physical Exam: General: Alert, awake, oriented x3, in no acute distress.  HEENT: [place]/AT PEERL, EOMI Neck: Trachea midline,  no masses, no thyromegal,y no JVD, no carotid bruit OROPHARYNX:  Moist, No exudate/ erythema/lesions.  Heart: Regular rate and rhythm, without murmurs, rubs, gallops, PMI non-displaced, no heaves or thrills on palpation.  Lungs: Clear to auscultation, no wheezing or rhonchi noted. No increased vocal fremitus resonant to percussion  Abdomen: Soft, nontender, nondistended, positive bowel sounds, no masses no hepatosplenomegaly noted..   Neuro: No focal neurological deficits noted cranial nerves II through XII grossly intact. DTRs 2+ bilaterally upper and lower extremities. Strength 5 out of 5 in bilateral upper and lower extremities. Musculoskeletal: No warm swelling or erythema around joints, no spinal tenderness noted. Psychiatric: Patient alert and oriented x3, good insight and cognition, good recent to remote recall. Lymph node survey: No cervical axillary or inguinal lymphadenopathy noted.  Lab Results:  Basic Metabolic Panel:    Component Value Date/Time   NA 138 02/23/2024 0650   NA 135 11/09/2022 1416   K 4.0 02/23/2024 0650   CL 106 02/23/2024 0650   CO2 24 02/23/2024 0650   BUN 11 02/23/2024 0650   BUN 13 11/09/2022 1416   CREATININE 0.97 02/23/2024 0650   GLUCOSE 104 (H) 02/23/2024 0650   CALCIUM 10.0 02/23/2024 0650   CBC:    Component Value Date/Time   WBC 11.5 (H) 02/23/2024 0650   HGB 13.8 02/23/2024 0650   HGB 14.4 11/09/2022 1416   HCT 36.5 (L) 02/23/2024 0650   HCT 41.0 11/09/2022 1416   PLT 344 02/23/2024 0650   PLT 303 11/09/2022 1416   MCV 80.6 02/23/2024 0650   MCV 88 11/09/2022 1416   NEUTROABS 7.4 02/23/2024 0650   NEUTROABS 6.7 11/09/2022 1416   LYMPHSABS 2.5 02/23/2024 0650   LYMPHSABS 4.2 (H) 11/09/2022 1416   MONOABS 1.2 (H) 02/23/2024 9349  EOSABS 0.2 02/23/2024 0650   EOSABS 0.4 11/09/2022 1416   BASOSABS 0.0 02/23/2024 0650   BASOSABS 0.1 11/09/2022 1416    No results found for this or any previous visit (from the past 240 hours).  Studies/Results: No results found.  Medications: Scheduled Meds:  amLODipine   10 mg Oral Daily   enoxaparin  (LOVENOX ) injection  40 mg Subcutaneous Q24H   HYDROmorphone    Intravenous Q4H   senna-docusate  1 tablet Oral BID   Continuous Infusions: PRN Meds:.diphenhydrAMINE , HYDROmorphone  (DILAUDID ) injection, naloxone  **AND** sodium chloride  flush, ondansetron  **OR** ondansetron  (ZOFRAN ) IV, mouth rinse, polyethylene  glycol  Consultants: None  Procedures: None  Antibiotics: None  Assessment/Plan: Active Problems:   Essential hypertension   OSA (obstructive sleep apnea)   Hypercalcemia   Sickle cell pain crisis (HCC)   Mild intermittent asthma without complication   Abdominal pain   Nausea & vomiting  Hb Sickle Cell Disease with Pain crisis: Continue IVF at [facility], continue low-dose Dilaudid  via PCA, continue IV Toradol  15 mg Q 6 H for a total of 5 days, continue oral home pain medications as ordered. Monitor vitals very closely, Re-evaluate pain scale regularly, 2 L of Oxygen by [place]. Patient encouraged to ambulate on the hallway today.  GERD: Continue PPIs. Anemia of Chronic Disease: No H&H today. But hemoglobin has been stable at baseline during this admission so far. Will continue to monitor and transfuse as appropriate. Will repeat labs in AM. Essential hypertension: Blood pressure is suboptimal: Continue home medications for better control. Chronic pain Syndrome: Continue oral home pain medications as ordered. History of asthma: Clinically stable.  Continue breathing treatment as needed.  Code Status: Full Code Family Communication: N/A Disposition Plan: Not yet ready for discharge  Cordarrel Stiefel  If 7PM-7AM, please contact night-coverage.  02/25/2024, 1:26 PM  LOS: 2 days

## 2024-02-26 LAB — COMPREHENSIVE METABOLIC PANEL WITH GFR
ALT: 26 U/L (ref 0–44)
AST: 32 U/L (ref 15–41)
Albumin: 4.4 g/dL (ref 3.5–5.0)
Alkaline Phosphatase: 91 U/L (ref 38–126)
Anion gap: 9 (ref 5–15)
BUN: 7 mg/dL (ref 6–20)
CO2: 24 mmol/L (ref 22–32)
Calcium: 11 mg/dL — ABNORMAL HIGH (ref 8.9–10.3)
Chloride: 101 mmol/L (ref 98–111)
Creatinine, Ser: 0.95 mg/dL (ref 0.61–1.24)
GFR, Estimated: >60 mL/min
Glucose, Bld: 100 mg/dL — ABNORMAL HIGH (ref 70–99)
Potassium: 4.4 mmol/L (ref 3.5–5.1)
Sodium: 134 mmol/L — ABNORMAL LOW (ref 135–145)
Total Bilirubin: 1.2 mg/dL (ref 0.0–1.2)
Total Protein: 7.8 g/dL (ref 6.5–8.1)

## 2024-02-26 LAB — CBC
Hemoglobin: 15 g/dL (ref 13.0–17.0)
Platelets: 351 K/uL (ref 150–400)
WBC: 7.2 K/uL (ref 4.0–10.5)
nRBC: 3.8 % — ABNORMAL HIGH (ref 0.0–0.2)

## 2024-02-26 MED ORDER — HYDROMORPHONE 1 MG/ML IV SOLN
INTRAVENOUS | Status: DC
  Administered 2024-02-26 (×2): 0.9 mg via INTRAVENOUS
  Administered 2024-02-27 (×3): 0.3 mg via INTRAVENOUS

## 2024-02-26 MED ORDER — OXYCODONE HCL 5 MG PO TABS
10.0000 mg | ORAL_TABLET | Freq: Four times a day (QID) | ORAL | Status: DC | PRN
  Administered 2024-02-27 (×2): 10 mg via ORAL
  Filled 2024-02-26 (×2): qty 2

## 2024-02-26 MED ORDER — NALOXONE HCL 0.4 MG/ML IJ SOLN: 0.4000 mg | INTRAMUSCULAR | Status: DC | PRN

## 2024-02-26 MED ORDER — SODIUM CHLORIDE 0.9% FLUSH: 9.0000 mL | INTRAVENOUS | Status: DC | PRN

## 2024-02-26 MED ORDER — HYDRALAZINE HCL 20 MG/ML IJ SOLN
5.0000 mg | Freq: Once | INTRAMUSCULAR | Status: AC
  Administered 2024-02-26: 5 mg via INTRAVENOUS
  Filled 2024-02-26: qty 1

## 2024-02-26 MED ORDER — MORPHINE SULFATE (PF) 4 MG/ML IV SOLN
4.0000 mg | Freq: Once | INTRAVENOUS | Status: DC
  Filled 2024-02-26: qty 1

## 2024-02-26 NOTE — Progress Notes (Signed)
 Patient ID: Dean Webb, male   DOB: 10/16/1975, 49 y.o.   MRN: [id number] Subjective: Dean Webb is a 49 y.o. male with history of sickle cell anemia presents to the ER with complaints of lower extremity pain typical of his sickle cell pain crisis.  In addition patient also was complaining of abdominal discomfort with multiple episodes of nausea & vomiting.  Denies any fever, chills, difficulty breathing, headache or any visual symptoms.  Patient states he ran out of his oxycodone  last week.   Patient is reporting ongoing pain of 8/10 this morning.  In his bilateral lower extremities.  He denies subjective fever, cough, chest pain, shortness of breath, headache or visual symptoms.  No urinary symptoms.  Objective:  Vital signs in last 24 hours:  Vitals:   02/26/24 0500 02/26/24 0533 02/26/24 0856 02/26/24 0938  BP:  (!) 143/102  (!) 154/95  Pulse:  63  60  Resp: 16 17 16 16   Temp:  97.8 F (36.6 C)  97.7 F (36.5 C)  TempSrc:    Oral  SpO2:  97%  100%  Weight:      Height:        Intake/Output from previous day:   Intake/Output Summary (Last 24 hours) at 02/26/2024 0946 Last data filed at 02/26/2024 0837 Gross per 24 hour  Intake 1460 ml  Output 2750 ml  Net -1290 ml    Physical Exam: General: Alert, awake, oriented x3, in no acute distress.  HEENT: [place]/AT PEERL, EOMI Neck: Trachea midline,  no masses, no thyromegal,y no JVD, no carotid bruit OROPHARYNX:  Moist, No exudate/ erythema/lesions.  Heart: Regular rate and rhythm, without murmurs, rubs, gallops, PMI non-displaced, no heaves or thrills on palpation.  Lungs: Clear to auscultation, no wheezing or rhonchi noted. No increased vocal fremitus resonant to percussion  Abdomen: Soft, nontender, nondistended, positive bowel sounds, no masses no hepatosplenomegaly noted..  Neuro: No focal neurological deficits noted cranial nerves II through XII grossly intact. DTRs 2+ bilaterally upper and lower extremities. Strength  5 out of 5 in bilateral upper and lower extremities. Musculoskeletal: No warm swelling or erythema around joints, no spinal tenderness noted. Psychiatric: Patient alert and oriented x3, good insight and cognition, good recent to remote recall. Lymph node survey: No cervical axillary or inguinal lymphadenopathy noted.  Lab Results:  Basic Metabolic Panel:    Component Value Date/Time   NA 134 (L) 02/26/2024 0728   NA 135 11/09/2022 1416   K 4.4 02/26/2024 0728   CL 101 02/26/2024 0728   CO2 24 02/26/2024 0728   BUN 7 02/26/2024 0728   BUN 13 11/09/2022 1416   CREATININE 0.95 02/26/2024 0728   GLUCOSE 100 (H) 02/26/2024 0728   CALCIUM 11.0 (H) 02/26/2024 0728   CBC:    Component Value Date/Time   WBC 11.5 (H) 02/23/2024 0650   HGB 13.8 02/23/2024 0650   HGB 14.4 11/09/2022 1416   HCT 36.5 (L) 02/23/2024 0650   HCT 41.0 11/09/2022 1416   PLT 344 02/23/2024 0650   PLT 303 11/09/2022 1416   MCV 80.6 02/23/2024 0650   MCV 88 11/09/2022 1416   NEUTROABS 7.4 02/23/2024 0650   NEUTROABS 6.7 11/09/2022 1416   LYMPHSABS 2.5 02/23/2024 0650   LYMPHSABS 4.2 (H) 11/09/2022 1416   MONOABS 1.2 (H) 02/23/2024 0650   EOSABS 0.2 02/23/2024 0650   EOSABS 0.4 11/09/2022 1416   BASOSABS 0.0 02/23/2024 0650   BASOSABS 0.1 11/09/2022 1416    No results found for  this or any previous visit (from the past 240 hours).  Studies/Results: No results found.  Medications: Scheduled Meds:  amLODipine   10 mg Oral Daily   enoxaparin  (LOVENOX ) injection  40 mg Subcutaneous Q24H   HYDROmorphone    Intravenous Q4H   senna-docusate  1 tablet Oral BID   Continuous Infusions: PRN Meds:.diphenhydrAMINE , naloxone  **AND** sodium chloride  flush, ondansetron  **OR** ondansetron  (ZOFRAN ) IV, mouth rinse, polyethylene glycol  Consultants: None  Procedures: None  Antibiotics: None  Assessment/Plan: Active Problems:   Essential hypertension   OSA (obstructive sleep apnea)   Hypercalcemia    Sickle cell pain crisis (HCC)   Mild intermittent asthma without complication   Abdominal pain   Nausea & vomiting   Chest pain   Hb Sickle Cell Disease with Pain crisis: Continue IVF 0.45% Saline @KVO , increased weight based Dilaudid  PCA to 0.3/10/1.5.  Continue IV Toradol  15 mg Q 6 H for a total of 5 days, continue oral home pain medications as ordered. Monitor vitals very closely, Re-evaluate pain scale regularly, 2 L of Oxygen by . Patient encouraged to ambulate on the hallway today.  Leukocytosis: WBC elevated, no acute signs or symptoms of infection.  Will continue to monitor daily CBC. Anemia of Chronic Disease: Hemoglobin at baseline.  No clinic indication for transfusion.  Will continue to monitor daily seen Chronic pain Syndrome: Continue oral home pain medication. GERD: Continue PPIs Essential hypertension: Blood pressure is controlled: Continue home medication for better control. History of asthma: Clinically stable.  Continue breathing treatment as needed.  Code Status: Full Code Family Communication: N/A Disposition Plan: Not yet ready for discharge  Homer CHRISTELLA Cover NP  If 7PM-7AM, please contact night-coverage.  02/26/2024, 9:46 AM  LOS: 3 days

## 2024-02-26 NOTE — TOC Initial Note (Signed)
 Transition of Care [facility]) - Initial/Assessment Note    Patient Details  Name: Dean Webb MRN: [id number] Date of Birth: 14-Sep-1975  [facility] CM/SW Contact:    Toy LITTIE Agar, RN Phone Number:[phone]  02/26/2024, 4:41 PM  Clinical Narrative:                 Inpatient care manager following patient with high risk for readmission. Patient is from home where he functions independently with no DME or Home health needs,. Patient states that [facility]. Patient has insurance and access to affordable medications. Currently there are no inpatient care manager needs.   Expected Discharge Plan: Home/Self Care Barriers to Discharge: Continued Medical Work up   Patient Goals and CMS Choice Patient states their goals for this hospitalization and ongoing recovery are:: To go home   Choice offered to / list presented to : NA      Expected Discharge Plan and Services In-house Referral: NA Discharge Planning Services: CM Consult Post Acute Care Choice: NA                   DME Arranged: N/A DME Agency: NA       HH Arranged: NA HH Agency: NA        Prior Living Arrangements/Services   Lives with:: Self Patient language and need for interpreter reviewed:: Yes Do you feel safe going back to the place where you live?: Yes      Need for Family Participation in Patient Care: No (Comment) Care giver support system in place?: Yes (comment) Current home services:  (n/a) Criminal Activity/Legal Involvement Pertinent to Current Situation/Hospitalization: No - Comment as needed  Activities of Daily Living   ADL Screening (condition at time of admission) Independently performs ADLs?: Yes (appropriate for developmental age) Is the patient deaf or have difficulty hearing?: No Does the patient have difficulty seeing, even when wearing glasses/contacts?: No Does the patient have difficulty concentrating, remembering, or making decisions?:  No  Permission Sought/Granted Permission sought to share information with : Family Supports Permission granted to share information with : Yes, Verbal Permission Granted  Share Information with NAME: [name] Sabina [phone]     Permission granted to share info w Relationship: sister  Permission granted to share info w Contact Information: [phone]  Emotional Assessment Appearance:: Appears stated age Attitude/Demeanor/Rapport: Gracious Affect (typically observed): Quiet Orientation: : Oriented to Self, Oriented to Place, Oriented to  Time, Oriented to Situation Alcohol / Substance Use: Not Applicable Psych Involvement: No (comment)  Admission diagnosis:  Sickle cell pain crisis (HCC) [D57.00] Chest pain, unspecified type [R07.9] Patient Active Problem List   Diagnosis Date Noted   Chest pain 02/25/2024   Nausea & vomiting 02/23/2024   Sickle-cell disease with pain (HCC) 10/09/2023   Sickle cell anemia with pain (HCC) 10/09/2023   CAP (community acquired pneumonia) 10/08/2023   Abdominal pain 10/08/2023   Sickle cell crisis (HCC) 09/20/2023   Substance abuse (HCC) 04/30/2023   Vasoocclusive sickle cell crisis (HCC) 04/01/2023   Chronic pain syndrome 10/26/2022   Rhabdomyolysis 08/23/2022   Mild intermittent asthma without complication 07/17/2022   Sickle cell pain crisis (HCC) 06/28/2022   Acute cough 06/27/2022   Pneumonia 06/27/2022   GERD (gastroesophageal reflux disease) 06/27/2022   Hypercalcemia 06/27/2022   Overweight (BMI 25.0-29.9) 06/15/2021   Seasonal allergies 06/15/2021   Vitamin D  deficiency 02/23/2020   Tobacco dependence    Cocaine  abuse (HCC) 08/08/2017  H. 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 [place] [address] Phone: [phone] Fax: [phone]     Social Drivers of Health (SDOH) Social History: SDOH Screenings   Food Insecurity: No Food Insecurity (02/23/2024)  Housing: Low Risk (02/23/2024)  Transportation Needs: No Transportation Needs (02/23/2024)  Utilities: Not At Risk (02/23/2024)  Depression (PHQ2-9): Low Risk (05/06/2023)  Recent Concern: Depression (PHQ2-9) - High Risk (04/30/2023)  Tobacco Use: High Risk (02/23/2024)   SDOH Interventions:     Readmission Risk Interventions    02/26/2024    4:37 PM 10/09/2023    3:47 PM  Readmission Risk Prevention Plan  Transportation Screening Complete Complete  PCP or Specialist Appt within 5-7 Days Complete   PCP or Specialist Appt within 3-5 Days  Complete  Home Care Screening Complete   Medication Review (RN CM) Complete   HRI or Home Care Consult  Complete  Social Work Consult for Recovery Care Planning/Counseling  Complete  Palliative Care Screening  Not Applicable  Medication Review Oceanographer)  Complete

## 2024-02-26 NOTE — Plan of Care (Signed)

## 2024-02-27 ENCOUNTER — Other Ambulatory Visit (HOSPITAL_COMMUNITY): Payer: Self-pay

## 2024-02-27 ENCOUNTER — Other Ambulatory Visit: Payer: Self-pay | Admitting: Nurse Practitioner

## 2024-02-27 DIAGNOSIS — D5720 Sickle-cell/Hb-C disease without crisis: Secondary | ICD-10-CM

## 2024-02-27 DIAGNOSIS — G894 Chronic pain syndrome: Secondary | ICD-10-CM

## 2024-02-27 LAB — URINE DRUG SCREEN
Amphetamines: NEGATIVE
Barbiturates: NEGATIVE
Benzodiazepines: NEGATIVE
Cocaine: NEGATIVE
Fentanyl: NEGATIVE
Methadone Scn, Ur: NEGATIVE
Opiates: NEGATIVE
Tetrahydrocannabinol: NEGATIVE

## 2024-02-27 MED ORDER — OXYCODONE HCL 10 MG PO TABS
10.0000 mg | ORAL_TABLET | Freq: Four times a day (QID) | ORAL | 0 refills | Status: AC | PRN
  Filled 2024-02-27: qty 60, 15d supply, fill #0

## 2024-02-27 MED ORDER — HYDRALAZINE HCL 20 MG/ML IJ SOLN
10.0000 mg | Freq: Once | INTRAMUSCULAR | Status: AC
  Administered 2024-02-27: 10 mg via INTRAVENOUS
  Filled 2024-02-27: qty 1

## 2024-02-27 MED ORDER — ALBUTEROL SULFATE (2.5 MG/3ML) 0.083% IN NEBU
2.5000 mg | INHALATION_SOLUTION | Freq: Four times a day (QID) | RESPIRATORY_TRACT | Status: DC | PRN
  Administered 2024-02-27: 2.5 mg via RESPIRATORY_TRACT
  Filled 2024-02-27: qty 3

## 2024-02-27 NOTE — Discharge Instructions (Signed)
 Dean Webb

## 2024-02-27 NOTE — Progress Notes (Signed)
"   Medication refill sent to pharmacy pending urine drug screen. PDMP website reviewed  "

## 2024-02-27 NOTE — Plan of Care (Addendum)
 Patient alert and oriented X4, urine sample collected for drug test prior to admission, and patient's PIV removed. Patients AVS explained and all questions answered. MD sent reifll to [facility] and patient preferred to pick up medication. Pt transported to discharge lounge awaiting ride. No further needs.  Problem: Education: Goal: Knowledge of vaso-occlusive preventative measures will improve Outcome: Adequate for Discharge Goal: Awareness of infection prevention will improve Outcome: Adequate for Discharge Goal: Awareness of signs and symptoms of anemia will improve Outcome: Adequate for Discharge Goal: Long-term complications will improve Outcome: Adequate for Discharge   Problem: Self-Care: Goal: Ability to incorporate actions that prevent/reduce pain crisis will improve Outcome: Adequate for Discharge   Problem: Bowel/Gastric: Goal: Gut motility will be maintained Outcome: Adequate for Discharge   Problem: Tissue Perfusion: Goal: Complications related to inadequate tissue perfusion will be avoided or minimized Outcome: Adequate for Discharge   Problem: Respiratory: Goal: Pulmonary complications will be avoided or minimized Outcome: Adequate for Discharge Goal: Acute Chest Syndrome will be identified early to prevent complications Outcome: Adequate for Discharge   Problem: Fluid Volume: Goal: Ability to maintain a balanced intake and output will improve Outcome: Adequate for Discharge   Problem: Sensory: Goal: Pain level will decrease with appropriate interventions Outcome: Adequate for Discharge   Problem: Health Behavior: Goal: Postive changes in compliance with treatment and prescription regimens will improve Outcome: Adequate for Discharge   Problem: Education: Goal: Knowledge of General Education information will improve Description: Including pain rating scale, medication(s)/side effects and non-pharmacologic comfort measures Outcome: Adequate for  Discharge   Problem: Health Behavior/Discharge Planning: Goal: Ability to manage health-related needs will improve Outcome: Adequate for Discharge   Problem: Clinical Measurements: Goal: Ability to maintain clinical measurements within normal limits will improve Outcome: Adequate for Discharge Goal: Will remain free from infection Outcome: Adequate for Discharge Goal: Diagnostic test results will improve Outcome: Adequate for Discharge Goal: Respiratory complications will improve Outcome: Adequate for Discharge Goal: Cardiovascular complication will be avoided Outcome: Adequate for Discharge   Problem: Activity: Goal: Risk for activity intolerance will decrease Outcome: Adequate for Discharge   Problem: Nutrition: Goal: Adequate nutrition will be maintained Outcome: Adequate for Discharge   Problem: Coping: Goal: Level of anxiety will decrease Outcome: Adequate for Discharge   Problem: Elimination: Goal: Will not experience complications related to bowel motility Outcome: Adequate for Discharge Goal: Will not experience complications related to urinary retention Outcome: Adequate for Discharge   Problem: Pain Managment: Goal: General experience of comfort will improve and/or be controlled Outcome: Adequate for Discharge   Problem: Safety: Goal: Ability to remain free from injury will improve Outcome: Adequate for Discharge   Problem: Skin Integrity: Goal: Risk for impaired skin integrity will decrease Outcome: Adequate for Discharge

## 2024-02-27 NOTE — Plan of Care (Signed)
" °  Problem: Education: Goal: Awareness of signs and symptoms of anemia will improve Outcome: Progressing Goal: Long-term complications will improve Outcome: Progressing   Problem: Bowel/Gastric: Goal: Gut motility will be maintained Outcome: Progressing   Problem: Respiratory: Goal: Acute Chest Syndrome will be identified early to prevent complications Outcome: Progressing   Problem: Fluid Volume: Goal: Ability to maintain a balanced intake and output will improve Outcome: Progressing   Problem: Sensory: Goal: Pain level will decrease with appropriate interventions Outcome: Progressing   Problem: Health Behavior: Goal: Postive changes in compliance with treatment and prescription regimens will improve Outcome: Progressing   "

## 2024-02-27 NOTE — Discharge Summary (Signed)
 " Physician Discharge Summary  [name] FMW:[id number] DOB: 04-28-1975 DOA: 02/22/2024  PCP: Oley Bascom RAMAN, NP  Admit date: 02/22/2024  Discharge date: 02/27/2024  Discharge Diagnoses:  Active Problems:   Essential hypertension   OSA (obstructive sleep apnea)   Hypercalcemia   Sickle cell pain crisis (HCC)   Mild intermittent asthma without complication   Abdominal pain   Nausea & vomiting   Chest pain   Discharge Condition: Stable  Disposition:   Follow-up Information     Oley Bascom RAMAN, NP. Call in 1 week(s).   Specialties: Pulmonary Disease, Endocrinology Contact information: [address] [place] [address] [phone]                Pt is discharged home in good condition and is to follow up with Oley Bascom RAMAN, NP this week to have labs evaluated. Jalene Lacko Capurro is instructed to increase activity slowly and balance with rest for the next few days, and use prescribed medication to complete treatment of pain  Diet: Regular Wt Readings from Last 3 Encounters:  02/22/24 91.2 kg  10/10/23 90.7 kg  10/02/23 90.7 kg    History of present illness:  Dean Webb is a 49 y.o. male with history of sickle cell anemia presented to the ER with complaints of lower extremity pain typical of his sickle cell pain crisis.  In addition patient also was complaining of abdominal discomfort with multiple episodes of nausea vomiting.  Denied fever chills difficulty breathing headache or any visual symptoms.  Patient states he ran out of his oxycodone  last week.   ED Course: In the ER patient's labs showed creatinine 1.27 calcium 10.9 WBC 11.6 troponins were negative.  CT abdomen pelvis did not show anything acute.  Hemoglobin was 14.2 reticulocyte count is 4.9 percentage.  Patient was given IV Dilaudid  for pain relief despite which patient was still in pain. He was admitted for further pain control.    Hospital Course:  Patient was admitted for  sickle cell pain crisis and managed appropriately with IVF, IV Dilaudid  via PCA and IV Toradol , as well as other adjunct therapies per sickle cell pain management protocols.  While on admission patient's hemoglobin remained stable at baseline with no clinical indication for transfusion.  Patient gradually improved symptomatically and is reporting a pain of 6/10 today with no new concerns.  Patient is able to ambulate without assistance, and tolerate p.o. without nausea or vomiting.  Patient asked to be discharged home. Patient was therefore discharged home today in a hemodynamically stable condition.   [name] will follow-up with PCP at discharge. Duran was counseled extensively about nonpharmacologic means of pain management, patient verbalized understanding and was appreciative of  the care received during this admission.   We discussed the need for good hydration, monitoring of hydration status, avoidance of heat, cold, stress, and infection triggers. We discussed the need to be adherent with taking his other home medications. Patient was reminded of the need to seek medical attention immediately if any symptom of bleeding, anemia, or infection occurs.  Discharge Exam: Vitals:   02/27/24 1021 02/27/24 1032  BP:    Pulse:    Resp:  18  Temp:    SpO2: 98%    Vitals:   02/27/24 0718 02/27/24 0959 02/27/24 1021 02/27/24 1032  BP:  (!) 136/90    Pulse:  76    Resp: 16 16  18   Temp:  98.4 F (36.9 C)  TempSrc:  Oral    SpO2:  98% 98%   Weight:      Height:        General appearance : Awake, alert, not in any distress. Speech Clear. Not toxic looking HEENT: Atraumatic and Normocephalic, pupils equally reactive to light and accomodation Neck: Supple, no JVD. No cervical lymphadenopathy.  Chest: Good air entry bilaterally, no added sounds  CVS: S1 S2 regular, no murmurs.  Abdomen: Bowel sounds present, Non tender and not distended with no gaurding, rigidity or rebound. Extremities: B/L  Lower Ext shows no edema, both legs are warm to touch Neurology: Awake alert, and oriented X 3, CN II-XII intact, Non focal Skin: No Rash  Discharge Instructions  Discharge Instructions     Call MD for:  persistant nausea and vomiting   Complete by: As directed    Call MD for:  severe uncontrolled pain   Complete by: As directed    Call MD for:  temperature >100.4   Complete by: As directed    Increase activity slowly   Complete by: As directed          The results of significant diagnostics from this hospitalization (including imaging, microbiology, ancillary and laboratory) are listed below for reference.    Significant Diagnostic Studies: CT ABDOMEN PELVIS WO CONTRAST Result Date: 02/23/2024 CLINICAL DATA:  Nonlocalized abdominal pain. EXAM: CT ABDOMEN AND PELVIS WITHOUT CONTRAST TECHNIQUE: Multidetector CT imaging of the abdomen and pelvis was performed following the standard protocol without IV contrast. RADIATION DOSE REDUCTION: This exam was performed according to the departmental dose-optimization program which includes automated exposure control, adjustment of the mA and/or kV according to patient size and/or use of iterative reconstruction technique. COMPARISON:  10/08/2023 FINDINGS: Lower chest: Unremarkable. Hepatobiliary: A tiny hypodensity in the liver parenchyma is too small to characterize but is statistically most likely benign. No followup imaging is recommended. Small area of low attenuation in the anterior liver, adjacent to the falciform ligament, is in a characteristic location for focal fatty deposition/anomalous perfusion. Gallbladder is surgically absent. No intrahepatic or extrahepatic biliary dilation. Pancreas: No focal mass lesion. No dilatation of the main duct. No intraparenchymal cyst. No peripancreatic edema. Spleen: Auto splenectomy Adrenals/Urinary Tract: No adrenal nodule or mass. Right pelvic kidney evident. Kidneys unremarkable. No evidence for  hydroureter. The urinary bladder appears normal for the degree of distention. Stomach/Bowel: Moderate distention of the stomach with food and fluid. Duodenum is normally positioned as is the ligament of Treitz. No small bowel wall thickening. No small bowel dilatation. The terminal ileum is normal. The appendix is normal. No gross colonic mass. No colonic wall thickening. Vascular/Lymphatic: There is mild atherosclerotic calcification of the abdominal aorta without aneurysm. There is no gastrohepatic or hepatoduodenal ligament lymphadenopathy. No retroperitoneal or mesenteric lymphadenopathy. No pelvic sidewall lymphadenopathy. Reproductive: The prostate gland and seminal vesicles are unremarkable. Other: No substantial intraperitoneal free fluid. Musculoskeletal: No worrisome lytic or sclerotic osseous abnormality. IMPRESSION: 1. No acute findings in the abdomen or pelvis. Specifically, no findings to explain the patient's history of abdominal pain. 2. Right pelvic kidney. 3.  Aortic Atherosclerosis (ICD10-I70.0). Electronically Signed   By: Camellia Candle M.D.   On: 02/23/2024 05:35   DG Chest 2 View Result Date: 02/22/2024 EXAM: 2 VIEW(S) XRAY OF THE CHEST 02/22/2024 06:29:00 PM COMPARISON: 10/02/2023 CLINICAL HISTORY: chest pain FINDINGS: LUNGS AND PLEURA: No focal pulmonary opacity. No pleural effusion. No pneumothorax. HEART AND MEDIASTINUM: No acute abnormality of the cardiac and mediastinal silhouettes. BONES AND  SOFT TISSUES: Remote right clavicular fracture, unchanged. IMPRESSION: 1. No acute findings. Electronically signed by: Morgane Naveau MD MD 02/22/2024 06:56 PM EST RP Workstation: [id number]    Microbiology: No results found for this or any previous visit (from the past 240 hours).   Labs: Basic Metabolic Panel: Recent Labs  Lab 02/22/24 1837 02/23/24 0650 02/26/24 0728  NA 135 138 134*  K 4.3 4.0 4.4  CL 102 106 101  CO2 23 24 24   GLUCOSE 99 104* 100*  BUN 16 11 7   CREATININE  1.27* 0.97 0.95  CALCIUM 10.9* 10.0 11.0*   Liver Function Tests: Recent Labs  Lab 02/22/24 1837 02/23/24 0650 02/26/24 0728  AST 26 36 32  ALT 21 28 26   ALKPHOS 108 93 91  BILITOT 1.1 1.4* 1.2  PROT 7.6 7.0 7.8  ALBUMIN 4.3 4.1 4.4   Recent Labs  Lab 02/23/24 0650  LIPASE 31   No results for input(s): AMMONIA in the last 168 hours. CBC: Recent Labs  Lab 02/22/24 1837 02/23/24 0650 02/26/24 0728  WBC 11.6* 11.5* 7.2  NEUTROABS 5.9 7.4  --   HGB 14.2 13.8 15.0  HCT 37.8* 36.5* RESULTS UNAVAILABLE DUE TO INTERFERING SUBSTANCE  MCV 80.8 80.6 RESULTS UNAVAILABLE DUE TO INTERFERING SUBSTANCE  PLT 348 344 351   Cardiac Enzymes: No results for input(s): CKTOTAL, CKMB, CKMBINDEX, TROPONINI in the last 168 hours. BNP: Invalid input(s): POCBNP CBG: Recent Labs  Lab 02/23/24 2317  GLUCAP 100*    Time coordinating discharge: 50 minutes  Signed:  Homer CHRISTELLA Cover NP  [facility] 02/27/2024, 12:02 PM "

## 2024-02-28 ENCOUNTER — Telehealth: Payer: Self-pay

## 2024-02-28 ENCOUNTER — Other Ambulatory Visit: Payer: Self-pay | Admitting: Nurse Practitioner

## 2024-02-28 NOTE — Transitions of Care (Post Inpatient/ED Visit) (Signed)
" ° °  02/28/2024  Name: Dean Webb MRN: [id number] DOB: 1975/08/28  Today's TOC FU Call Status: Today's TOC FU Call Status:: Unsuccessful Call (1st Attempt) Unsuccessful Call (1st Attempt) Date: 02/28/24  Attempted to reach the patient regarding the most recent Inpatient/ED visit.  Follow Up Plan: Additional outreach attempts will be made to reach the patient to complete the Transitions of Care (Post Inpatient/ED visit) call.   Arvin Seip RN, BSN, CCM [employer], Population Health Case Manager Phone: [phone]  "

## 2024-02-29 ENCOUNTER — Telehealth: Payer: Self-pay

## 2024-02-29 NOTE — Transitions of Care (Post Inpatient/ED Visit) (Signed)
" ° °  02/29/2024  Name: Dean Webb MRN: [id number] DOB: 03/09/1975  Today's TOC FU Call Status: Today's TOC FU Call Status:: Unsuccessful Call (2nd Attempt) Unsuccessful Call (2nd Attempt) Date: 02/29/24  Attempted to reach the patient regarding the most recent Inpatient/ED visit.  Follow Up Plan: Additional outreach attempts will be made to reach the patient to complete the Transitions of Care (Post Inpatient/ED visit) call.   Arvin Seip RN, BSN, CCM [employer], Population Health Case Manager Phone: [phone]  "

## 2024-03-03 ENCOUNTER — Telehealth: Payer: Self-pay | Admitting: *Deleted

## 2024-03-03 NOTE — Transitions of Care (Post Inpatient/ED Visit) (Signed)
" ° °  03/03/2024  Name: Dean Webb MRN: [id number] DOB: [birth year]  Today's TOC FU Call Status: Today's TOC FU Call Status:: Successful TOC FU Call Completed TOC FU Call Complete Date: 03/03/24  Patient's Name and Date of Birth confirmed.    Transition Care Management Follow-up Telephone Call Discharge Facility: [facility]) Type of Discharge: Inpatient Admission Primary Inpatient Discharge Diagnosis:: sickle cell pain crisis/ hypertension How have you been since you were released from the hospital?: Better Any questions or concerns?: No  Items Reviewed: Did you receive and understand the discharge instructions provided?: Yes Medications obtained,verified, and reconciled?: No Medications Not Reviewed Reasons:: Other: (Patient stated he had his medications and didn't want to go over them) Any new allergies since your discharge?: No Dietary orders reviewed?: No Do you have support at home?: Yes People in Home [RPT]: alone Name of Support/Comfort Primary Source: [name] sister  Medications Reviewed Today: Medications Reviewed Today   Medications were not reviewed in this encounter     Home Care and Equipment/Supplies: Were Home Health Services Ordered?: NA Any new equipment or medical supplies ordered?: NA  Functional Questionnaire: Do you need assistance with bathing/showering or dressing?: No Do you need assistance with meal preparation?: No Do you need assistance with eating?: No Do you have difficulty maintaining continence: No Do you need assistance with getting out of bed/getting out of a chair/moving?: No Do you have difficulty managing or taking your medications?: No  Follow up appointments reviewed: PCP Follow-up appointment confirmed?: No MD Provider Line Number:[phone] Given: Yes (Per patient he will call for appt) Specialist Hospital Follow-up appointment confirmed?: NA Do you need transportation to your follow-up appointment?: No Do you  understand care options if your condition(s) worsen?: Yes-patient verbalized understanding  SDOH Interventions Today    Flowsheet Row Most Recent Value  SDOH Interventions   Food Insecurity Interventions Intervention Not Indicated  Housing Interventions Intervention Not Indicated  Transportation Interventions Intervention Not Indicated  Utilities Interventions Intervention Not Indicated   Discussed and offered 30 day TOC program.  Patient   declined.  The patient has been provided with contact information for the care management team and has been advised to call with any health -related questions or concerns.  The patient verbalized understanding with current plan of care.  The patient is directed to their insurance card regarding availability of benefits coverage   Cathlean Headland BSN RN [facility] Health Care Management Coordinator [email or web address] Direct Dial: [phone]  Fax: [phone] Website: [email or web address]  "
